# Patient Record
Sex: Male | Born: 1960 | Race: Black or African American | Hispanic: No | Smoking: Current every day smoker
Health system: Southern US, Community
[De-identification: ages and names within clinical notes are randomized; demographics above are authoritative.]

## PROBLEM LIST (undated history)

## (undated) DIAGNOSIS — F1410 Cocaine abuse, uncomplicated: Secondary | ICD-10-CM

## (undated) DIAGNOSIS — M1990 Unspecified osteoarthritis, unspecified site: Secondary | ICD-10-CM

## (undated) DIAGNOSIS — R55 Syncope and collapse: Secondary | ICD-10-CM

## (undated) DIAGNOSIS — Z720 Tobacco use: Secondary | ICD-10-CM

## (undated) DIAGNOSIS — J42 Unspecified chronic bronchitis: Secondary | ICD-10-CM

## (undated) DIAGNOSIS — K219 Gastro-esophageal reflux disease without esophagitis: Secondary | ICD-10-CM

## (undated) DIAGNOSIS — E785 Hyperlipidemia, unspecified: Secondary | ICD-10-CM

## (undated) DIAGNOSIS — J45909 Unspecified asthma, uncomplicated: Secondary | ICD-10-CM

## (undated) DIAGNOSIS — E7800 Pure hypercholesterolemia, unspecified: Secondary | ICD-10-CM

## (undated) DIAGNOSIS — I10 Essential (primary) hypertension: Secondary | ICD-10-CM

## (undated) DIAGNOSIS — R519 Headache, unspecified: Secondary | ICD-10-CM

## (undated) DIAGNOSIS — G4733 Obstructive sleep apnea (adult) (pediatric): Secondary | ICD-10-CM

## (undated) DIAGNOSIS — E119 Type 2 diabetes mellitus without complications: Secondary | ICD-10-CM

## (undated) HISTORY — DX: Syncope and collapse: R55

## (undated) HISTORY — DX: Unspecified asthma, uncomplicated: J45.909

## (undated) HISTORY — DX: Obstructive sleep apnea (adult) (pediatric): G47.33

## (undated) HISTORY — DX: Cocaine abuse, uncomplicated: F14.10

## (undated) HISTORY — DX: Hyperlipidemia, unspecified: E78.5

## (undated) HISTORY — DX: Tobacco use: Z72.0

## (undated) HISTORY — PX: FINGER AMPUTATION: SHX636

---

## 1997-09-02 ENCOUNTER — Emergency Department (HOSPITAL_COMMUNITY): Admission: EM | Admit: 1997-09-02 | Discharge: 1997-09-02 | Payer: Self-pay | Admitting: Emergency Medicine

## 2001-07-15 ENCOUNTER — Emergency Department (HOSPITAL_COMMUNITY): Admission: EM | Admit: 2001-07-15 | Discharge: 2001-07-15 | Payer: Self-pay | Admitting: Emergency Medicine

## 2002-06-03 ENCOUNTER — Emergency Department (HOSPITAL_COMMUNITY): Admission: EM | Admit: 2002-06-03 | Discharge: 2002-06-03 | Payer: Self-pay | Admitting: Emergency Medicine

## 2002-06-18 ENCOUNTER — Observation Stay (HOSPITAL_COMMUNITY): Admission: EM | Admit: 2002-06-18 | Discharge: 2002-06-18 | Payer: Self-pay | Admitting: Emergency Medicine

## 2002-06-18 ENCOUNTER — Encounter: Payer: Self-pay | Admitting: Emergency Medicine

## 2002-08-11 ENCOUNTER — Encounter: Payer: Self-pay | Admitting: Emergency Medicine

## 2002-08-11 ENCOUNTER — Emergency Department (HOSPITAL_COMMUNITY): Admission: EM | Admit: 2002-08-11 | Discharge: 2002-08-12 | Payer: Self-pay | Admitting: Emergency Medicine

## 2003-01-05 ENCOUNTER — Encounter: Payer: Self-pay | Admitting: Family Medicine

## 2003-01-05 ENCOUNTER — Emergency Department (HOSPITAL_COMMUNITY): Admission: AD | Admit: 2003-01-05 | Discharge: 2003-01-05 | Payer: Self-pay | Admitting: Family Medicine

## 2003-10-08 ENCOUNTER — Emergency Department (HOSPITAL_COMMUNITY): Admission: EM | Admit: 2003-10-08 | Discharge: 2003-10-08 | Payer: Self-pay | Admitting: Emergency Medicine

## 2003-10-14 ENCOUNTER — Emergency Department (HOSPITAL_COMMUNITY): Admission: EM | Admit: 2003-10-14 | Discharge: 2003-10-14 | Payer: Self-pay | Admitting: Emergency Medicine

## 2003-12-22 ENCOUNTER — Emergency Department (HOSPITAL_COMMUNITY): Admission: EM | Admit: 2003-12-22 | Discharge: 2003-12-22 | Payer: Self-pay | Admitting: Emergency Medicine

## 2004-01-19 ENCOUNTER — Emergency Department (HOSPITAL_COMMUNITY): Admission: EM | Admit: 2004-01-19 | Discharge: 2004-01-19 | Payer: Self-pay | Admitting: Emergency Medicine

## 2004-01-20 ENCOUNTER — Inpatient Hospital Stay (HOSPITAL_COMMUNITY): Admission: EM | Admit: 2004-01-20 | Discharge: 2004-01-21 | Payer: Self-pay | Admitting: Emergency Medicine

## 2004-03-15 ENCOUNTER — Emergency Department (HOSPITAL_COMMUNITY): Admission: EM | Admit: 2004-03-15 | Discharge: 2004-03-15 | Payer: Self-pay | Admitting: Emergency Medicine

## 2004-05-30 ENCOUNTER — Emergency Department (HOSPITAL_COMMUNITY): Admission: EM | Admit: 2004-05-30 | Discharge: 2004-05-30 | Payer: Self-pay | Admitting: *Deleted

## 2004-07-06 ENCOUNTER — Emergency Department (HOSPITAL_COMMUNITY): Admission: EM | Admit: 2004-07-06 | Discharge: 2004-07-06 | Payer: Self-pay | Admitting: Emergency Medicine

## 2004-12-12 ENCOUNTER — Emergency Department (HOSPITAL_COMMUNITY): Admission: EM | Admit: 2004-12-12 | Discharge: 2004-12-12 | Payer: Self-pay | Admitting: *Deleted

## 2005-03-29 ENCOUNTER — Emergency Department (HOSPITAL_COMMUNITY): Admission: EM | Admit: 2005-03-29 | Discharge: 2005-03-29 | Payer: Self-pay | Admitting: Emergency Medicine

## 2005-03-30 ENCOUNTER — Emergency Department (HOSPITAL_COMMUNITY): Admission: EM | Admit: 2005-03-30 | Discharge: 2005-03-30 | Payer: Self-pay | Admitting: Emergency Medicine

## 2005-06-21 ENCOUNTER — Emergency Department (HOSPITAL_COMMUNITY): Admission: EM | Admit: 2005-06-21 | Discharge: 2005-06-21 | Payer: Self-pay | Admitting: Emergency Medicine

## 2006-04-04 ENCOUNTER — Encounter: Admission: RE | Admit: 2006-04-04 | Discharge: 2006-04-04 | Payer: Self-pay | Admitting: Cardiovascular Disease

## 2006-05-09 ENCOUNTER — Ambulatory Visit (HOSPITAL_COMMUNITY): Admission: RE | Admit: 2006-05-09 | Discharge: 2006-05-09 | Payer: Self-pay | Admitting: Cardiovascular Disease

## 2006-09-05 ENCOUNTER — Ambulatory Visit (HOSPITAL_COMMUNITY): Admission: RE | Admit: 2006-09-05 | Discharge: 2006-09-05 | Payer: Self-pay | Admitting: Cardiovascular Disease

## 2006-09-27 ENCOUNTER — Emergency Department (HOSPITAL_COMMUNITY): Admission: EM | Admit: 2006-09-27 | Discharge: 2006-09-27 | Payer: Self-pay | Admitting: Emergency Medicine

## 2007-01-11 ENCOUNTER — Encounter: Admission: RE | Admit: 2007-01-11 | Discharge: 2007-01-11 | Payer: Self-pay | Admitting: General Surgery

## 2007-02-27 ENCOUNTER — Encounter: Admission: RE | Admit: 2007-02-27 | Discharge: 2007-02-27 | Payer: Self-pay | Admitting: Cardiovascular Disease

## 2007-03-05 ENCOUNTER — Emergency Department (HOSPITAL_COMMUNITY): Admission: EM | Admit: 2007-03-05 | Discharge: 2007-03-05 | Payer: Self-pay | Admitting: Emergency Medicine

## 2009-04-19 ENCOUNTER — Ambulatory Visit: Payer: Self-pay | Admitting: Family Medicine

## 2009-04-20 ENCOUNTER — Ambulatory Visit (HOSPITAL_COMMUNITY): Admission: RE | Admit: 2009-04-20 | Discharge: 2009-04-20 | Payer: Self-pay | Admitting: Family Medicine

## 2009-04-30 ENCOUNTER — Ambulatory Visit (HOSPITAL_COMMUNITY): Admission: RE | Admit: 2009-04-30 | Discharge: 2009-04-30 | Payer: Self-pay | Admitting: Cardiology

## 2009-05-11 ENCOUNTER — Ambulatory Visit: Payer: Self-pay | Admitting: Internal Medicine

## 2009-05-12 ENCOUNTER — Encounter (INDEPENDENT_AMBULATORY_CARE_PROVIDER_SITE_OTHER): Payer: Self-pay | Admitting: Family Medicine

## 2009-05-12 LAB — CONVERTED CEMR LAB
ALT: 26 units/L (ref 0–53)
AST: 22 units/L (ref 0–37)
Albumin: 4.5 g/dL (ref 3.5–5.2)
Alkaline Phosphatase: 57 units/L (ref 39–117)
BUN: 11 mg/dL (ref 6–23)
Basophils Absolute: 0 10*3/uL (ref 0.0–0.1)
Basophils Relative: 1 % (ref 0–1)
CO2: 22 meq/L (ref 19–32)
Calcium: 9.7 mg/dL (ref 8.4–10.5)
Chloride: 102 meq/L (ref 96–112)
Cholesterol: 253 mg/dL — ABNORMAL HIGH (ref 0–200)
Creatinine, Ser: 0.96 mg/dL (ref 0.40–1.50)
Eosinophils Absolute: 0.1 10*3/uL (ref 0.0–0.7)
Eosinophils Relative: 2 % (ref 0–5)
Glucose, Bld: 95 mg/dL (ref 70–99)
HCT: 47 % (ref 39.0–52.0)
HDL: 44 mg/dL (ref >39)
Hemoglobin: 15 g/dL (ref 13.0–17.0)
LDL Cholesterol: 194 mg/dL — ABNORMAL HIGH (ref 0–99)
Lymphocytes Relative: 54 % — ABNORMAL HIGH (ref 12–46)
Lymphs Abs: 3.2 10*3/uL (ref 0.7–4.0)
MCHC: 31.9 g/dL (ref 30.0–36.0)
MCV: 82.3 fL (ref 78.0–100.0)
Monocytes Absolute: 0.4 10*3/uL (ref 0.1–1.0)
Monocytes Relative: 7 % (ref 3–12)
Neutro Abs: 2.2 10*3/uL (ref 1.7–7.7)
Neutrophils Relative %: 36 % — ABNORMAL LOW (ref 43–77)
PSA: 0.72 ng/mL (ref 0.10–4.00)
Platelets: 291 10*3/uL (ref 150–400)
Potassium: 4.5 meq/L (ref 3.5–5.3)
RBC: 5.71 M/uL (ref 4.22–5.81)
RDW: 17.2 % — ABNORMAL HIGH (ref 11.5–15.5)
Sodium: 138 meq/L (ref 135–145)
Total Bilirubin: 0.4 mg/dL (ref 0.3–1.2)
Total CHOL/HDL Ratio: 5.8
Total Protein: 8.1 g/dL (ref 6.0–8.3)
Triglycerides: 76 mg/dL (ref <150)
VLDL: 15 mg/dL (ref 0–40)
Vit D, 25-Hydroxy: 11 ng/mL — ABNORMAL LOW (ref 30–89)
WBC: 6 10*3/uL (ref 4.0–10.5)

## 2009-05-18 ENCOUNTER — Ambulatory Visit: Payer: Self-pay | Admitting: Internal Medicine

## 2009-05-18 ENCOUNTER — Ambulatory Visit: Payer: Self-pay | Admitting: Family Medicine

## 2009-05-25 ENCOUNTER — Ambulatory Visit (HOSPITAL_COMMUNITY): Admission: RE | Admit: 2009-05-25 | Discharge: 2009-05-25 | Payer: Self-pay | Admitting: Family Medicine

## 2009-07-13 ENCOUNTER — Ambulatory Visit: Payer: Self-pay | Admitting: Internal Medicine

## 2009-07-18 ENCOUNTER — Emergency Department (HOSPITAL_COMMUNITY): Admission: EM | Admit: 2009-07-18 | Discharge: 2009-07-18 | Payer: Self-pay | Admitting: Family Medicine

## 2009-11-18 ENCOUNTER — Emergency Department (HOSPITAL_COMMUNITY): Admission: EM | Admit: 2009-11-18 | Discharge: 2009-11-18 | Payer: Self-pay | Admitting: Emergency Medicine

## 2010-05-02 ENCOUNTER — Inpatient Hospital Stay (INDEPENDENT_AMBULATORY_CARE_PROVIDER_SITE_OTHER)
Admission: RE | Admit: 2010-05-02 | Discharge: 2010-05-02 | Disposition: A | Discharge status: Home / Self Care | Payer: Self-pay | Source: Ambulatory Visit | Attending: Family Medicine | Admitting: Family Medicine

## 2010-05-02 DIAGNOSIS — L723 Sebaceous cyst: Secondary | ICD-10-CM

## 2010-07-25 ENCOUNTER — Ambulatory Visit (INDEPENDENT_AMBULATORY_CARE_PROVIDER_SITE_OTHER): Payer: Self-pay

## 2010-07-25 ENCOUNTER — Inpatient Hospital Stay (INDEPENDENT_AMBULATORY_CARE_PROVIDER_SITE_OTHER)
Admission: RE | Admit: 2010-07-25 | Discharge: 2010-07-25 | Disposition: A | Discharge status: Home / Self Care | Payer: Self-pay | Source: Ambulatory Visit | Attending: Family Medicine | Admitting: Family Medicine

## 2010-07-25 DIAGNOSIS — J40 Bronchitis, not specified as acute or chronic: Secondary | ICD-10-CM

## 2010-07-25 DIAGNOSIS — R05 Cough: Secondary | ICD-10-CM

## 2010-07-25 DIAGNOSIS — R059 Cough, unspecified: Secondary | ICD-10-CM

## 2010-08-08 ENCOUNTER — Ambulatory Visit (INDEPENDENT_AMBULATORY_CARE_PROVIDER_SITE_OTHER): Payer: Self-pay

## 2010-08-08 ENCOUNTER — Inpatient Hospital Stay (INDEPENDENT_AMBULATORY_CARE_PROVIDER_SITE_OTHER)
Admission: RE | Admit: 2010-08-08 | Discharge: 2010-08-08 | Disposition: A | Discharge status: Home / Self Care | Payer: Self-pay | Source: Ambulatory Visit | Attending: Emergency Medicine | Admitting: Emergency Medicine

## 2010-08-08 DIAGNOSIS — M25469 Effusion, unspecified knee: Secondary | ICD-10-CM

## 2010-08-08 DIAGNOSIS — M79609 Pain in unspecified limb: Secondary | ICD-10-CM

## 2010-08-12 NOTE — Op Note (Signed)
[name], Kenneth NO.:  [name]   MEDICAL RECORD NO.:  [id number]                   PATIENT TYPE:  EMS   LOCATION:  MAJO                                 FACILITY:  [facility]   PHYSICIAN:  Kenneth [name], M.D.         DATE OF BIRTH:  September 03, 1960   DATE OF PROCEDURE:  01/05/2003  DATE OF DISCHARGE:                                 OPERATIVE REPORT   PREOPERATIVE DIAGNOSIS:   POSTOPERATIVE DIAGNOSIS:   OPERATION PERFORMED:   SURGEON:  Kenneth [name], M.D.   ASSISTANT:   ANESTHESIA:   COMPLICATIONS:   INDICATIONS FOR PROCEDURE:  Kenneth [name] presents to the emergency room for  evaluation.  He was seen by Kenneth [name] and referred for my care.  The patient is 50 years of age.  He works for a [employer].  He  injured his left index finger today with a hand saw.  This was a cut through  the dorsal aspect of the left index finger with extensor tendon injury.  This occurred approximately noon.  The patient has had a tetanus shot seven  months ago.  He was seen and evaluated by Kenneth [name] and asked to see me  for hand surgery care.  I have discussed with the patient his findings at  length.   He notes no numbness or tingling.  He states he does have significant pain  in the finger.  I have noted that the patient does have a freshly cut  tendon.  There was some fraying to the tendon edges.   PAST MEDICAL HISTORY:  History of bleeding ulcers, last episode three weeks  ago, often associated with increased alcohol intake.   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 Kenneth [name], M.D. after an injury.   MEDICINES:  None.   ALLERGIES:  None.   FAMILY HISTORY:  Noncontributory.   SOCIAL HISTORY:  He is divorced with children.  He works for [employer].  He smokes one pack per day and occasionally drinks alcohol.  He  occasionally consumes marijuana with  smoking activities.   REVIEW OF SYSTEMS:  Positive for occasional ulcerative issues.  I have  discussed these issues with him at length.   PHYSICAL EXAMINATION:  GENERAL:  He is a black male, alert and oriented, in  no acute distress.  VITAL SIGNS:  Stable.  EXTREMITIES:  He has no signs of infection in the hand.  He has a laceration  of the dorsal aspect of the left index finger with exposed extensor tendon  over the proximal phalanx. The remaining fingers are normal.  Elbow and  shoulder examination is benign.  Bilateral lower extremity examination is  negative.  CHEST:  Clear.  ABDOMEN:  Nontender and nondistended and soft.   His x-rays were reviewed from today which shows no fracture dislocation or  space occupying lesion.   IMPRESSION:  Status post saw injury, left index finger with extensor tendon  laceration and poor tissue conditions.   PLAN:  I have verbally consented him for incision and drainage and repair of  structures as necessary.   DESCRIPTION OF PROCEDURE:  The patient was taken to the procedure area where  he was given additional block.  Following this, he was prepped and draped in  the usual sterile fashion, given a 10-minute surgical Betadine scrub  followed by isolation of the finger.  I then took a 15 blade and extended  the incision proximally and distally, trimmed nonviable tissue including  tendon, subcu and skin tissue.  I then irrigated copiously with greater than  a liter of fluid.  Following this, the patient underwent extensor tendon  repair in zone 4 without difficulty.  The patient tolerated the procedure  well.  This was an oblique laceration and I shortened it somewhat but got an  excellent repair.  I then ranged the finger and noted that he had an  excellent repair with the 4-0 Ethibond suture.  I was pleased with this in  the findings.  Following this, I then irrigated the wound copiously and  ensured correct hemostasis with cautery and  following this, the wound was  closed.  Xeroform was placed.  He was then placed in a splint to keep the  hand immobilized.  The patient tolerated the procedure well.  We discharged  him on appropriate pain medicine in the form of Percocet and Robaxin.  I  have placed him on Keflex as well as Pepcid 20 mg 1 by mouth twice daily.  We will have him seen in therapy in two to three days and see him in the  office by myself in seven days.  I have discussed with him do's and don't's,  etc.  He is a candidate for early range of motion and I have discussed some  of these issues.  All questions have been encouraged and answered.                                                Kenneth [name], M.D.    [facility]  D:  01/05/2003  T:  01/06/2003  Job:  [id number]   cc:   Lilyan Punt. Sydnee [name], M.D.  [address] [place]  [place] [address]  Fax: [phone]

## 2010-08-12 NOTE — Discharge Summary (Signed)
NAMEULRICH, SOULES NO.:  [id number]   MEDICAL RECORD NO.:  [id number]          PATIENT TYPE:  INP   LOCATION:  2037                         FACILITY:  [facility]   PHYSICIAN:  Charlton Haws, M.D.     DATE OF BIRTH:  [birth year]   DATE OF ADMISSION:  01/20/2004  DATE OF DISCHARGE:  01/21/2004                                 DISCHARGE SUMMARY   PROCEDURES:  1.  Cardiac catheterization.  2.  Coronary arteriogram.  3.  Left ventriculogram.   HOSPITAL COURSE:  [name] is a 50 year old male with no known history of  coronary artery disease.  He had some chest pain and came to the emergency  room on January 19, 2004.  The chest pain had resolved.  He had a urine drug  screen done which was positive for cocaine.  Mr. Laymon refused to stay and  be evaluated.  Mr. Ferns came back to the emergency room on January 20, 2004.  He stated he had had no further chest pain, but wanted to find out  what was going on.  He was admitted for further evaluation and treatment.   Mr. Shenker was evaluated by Dr. Eden Emms who felt that cardiac catheterization  was indicated to define his anatomy.  He had a catheterization done on  January 20, 2004.   The cardiac catheterization showed a 30% left main lesion at the ostium.  The LAD, circumflex, and RCA systems were all normal.  His EF was 65% with  normal wall motion and no MR.  His aorta had no dissection.  Dr. Eden Emms felt  that he had noncardiac chest pain possibly secondary to cocaine.  He felt  that he could be discharged on January 20, 2004 if his groin was stable.   Post procedure Mr. Fjeld was asymptomatic and his groin was stable.  If his  groin remains stable he will be discharged on January 21, 2004 with  outpatient follow-up arranged.   LABORATORIES:  Hemoglobin 14.4, hematocrit 43.2, WBC 6.3, platelets 262.  Urine drug screen positive only for cocaine.   CONDITION ON DISCHARGE:  Stable.   DISCHARGE DIAGNOSES:  1.  Chest  pain.  No significant coronary artery disease by catheterization      this admission.  2.  History of alcohol, tobacco, and drug abuse.  3.  History of hyperlipidemia.  4.  Remote history of peptic ulcer disease.   DISCHARGE INSTRUCTIONS:  His activity level is to include no driving or  strenuous activity for two days.  He is to stick to a low fat diet.  He is  to call our office for problems with the catheterization site.  He is not to  use alcohol, tobacco, or drugs.  He is to follow up with the P.A. for Dr.  Eden Emms on November 10 at 12:30 p.m.   DISCHARGE MEDICATIONS:  Over-the-counter H2 blocker p.r.n.       RB/MEDQ  D:  01/20/2004  T:  01/20/2004  Job:  [id number]

## 2010-08-12 NOTE — Cardiovascular Report (Signed)
[name], Kenneth [name] NO.:  [id number]   MEDICAL RECORD NO.:  [id number]          PATIENT TYPE:  OIB   LOCATION:  2899                         FACILITY:  [facility]   PHYSICIAN:  Ricki Rodriguez, M.D.  DATE OF BIRTH:  11/05/1960   DATE OF PROCEDURE:  05/09/2006  DATE OF DISCHARGE:                            CARDIAC CATHETERIZATION   PROCEDURES:  1. Left heart catheterization.  2. Selective coronary angiography.  3. Left ventricular function study.   INDICATIONS:  This 50 year old black male presented with recurrent chest  pain, exertional dyspnea, hypertension, and risk factors of smoking.   APPROACH:  Right femoral artery using 4-French sheath and catheters.   COMPLICATIONS:  None.   HEMODYNAMIC DATA:  The aortic pressure was 100/80, and left ventricular  pressure was 100/10.   CORONARY ANATOMY:  The left main coronary artery was unremarkable but  had ectatic segment with apparent ostial narrowing.   Left anterior descending coronary artery: The left anterior descending  artery also showed proximal ectasia and had 20% intermediate lesions x3.  The distal vessel was unremarkable.  The diagonal #1 vessel had ostial  to proximal 30% eccentric lesion.  Diagonal #2 was unremarkable, and  diagonal-3 was a very small vessel.   Left circumflex coronary artery:  The left circumflex coronary artery  showed luminal irregularities, otherwise was unremarkable.  Its ramus  branch was also unremarkable.  The obtuse marginal branch #1 had a  diffuse proximal disease, and obtuse marginal branch #2 was  unremarkable.   Right coronary artery:  The right artery was dominant, had distal long  30% concentric narrowing with 20% spasm which resolved with 100 mcg  nitroglycerin use.  Its posterolateral and posterior descending coronary  arteries were unremarkable.   Left ventriculogram: The left ventriculogram showed mild apical  hypokinesia with ejection fraction of 60%.   IMPRESSION:  1. Mild multivessel native vessel coronary artery disease.  2. Right coronary artery spasm.   RECOMMENDATIONS:  This patient will be treated medically with the  addition of a small dose of calcium channel blocker to his current  medications and also addition of Zocor or Lipitor along with lifestyle  modification, primarily smoking cessation.      Ricki Rodriguez, M.D.  Electronically Signed     ASK/MEDQ  D:  05/09/2006  T:  05/09/2006  Job:  [id number]

## 2010-08-12 NOTE — Op Note (Signed)
NAME:  Kenneth [name], Kenneth [name]                           ACCOUNT NO.:  [id number]   MEDICAL RECORD NO.:  [id number]                   PATIENT TYPE:  OBV   LOCATION:  2550                                 FACILITY:  [facility]   PHYSICIAN:  Katy Fitch. Naaman Plummer., M.D.          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   POSTOPERATIVE DIAGNOSIS:  Traumatic amputation through proximal metaphysis  of distal phalanx, left small finger with avulsion of 90% of the dorsal,  intermediate and ventral nail matrix.   OPERATION/PROCEDURE:  1. Resection of nail matrix including remnants of dorsal, intermediate and     ventral nail fold.  2. Distal interphalangeal disarticulation revision amputation of left small     finger through distal metaphysis of middle phalanx.   SURGEON:  Katy Fitch. Sypher, M.D.   ASSISTANT:  Jonni Sanger, P.A.   ANESTHESIA:  General by LMA.   ANESTHESIOLOGIST:  Burna Forts, M.D.   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  He lost the  nail plate and approximately 90% of the dorsal, intermediate and ventral  nail fold.  He was seen in the [facility] emergency room by Dr. Beverely Pace who performed x-rays  revealing an amputation through the base of the distal phalanx.  A hand surgery consult was requested.  Clinical examination revealed signs of a traumatic amputation, short on the  volar aspect of the finger.  We recommended proceeding with revision  amputation. After  informed consent, Kenneth [name] was brought to the operating  room at this time.   DESCRIPTION OF PROCEDURE:  Kenneth [name] was brought to the operating room and  placed in the supine position on the table.  Following induction of general  anesthesia by LMA, the left arm was prepped with Betadine soak and solution  and sterilely draped.  Following exsanguination of the limb with the Esmarch bandage, with  tourniquet __________ inflated to 230 mmHg.  The procedure commenced with  irrigation and debridement of the wound.  The foreign material was removed.  The dorsal, intermediate and ventral nail fold remnants were carefully  debrided utilizing a 15-scalpel blade and dural hooks to retract the skin.  The skin margins were debrided.  The radial __________ artery and nerve were  identified.  The artery placed under traction, electrocauterized and allowed  to retract.  The nerve was placed under traction, and transected with a  fresh  scalpel blade.  The ulnar ___________ digital artery and nerve were likewise revised.  The  ulnar ___________ nerve was allowed to retract into virgin fat.  The skin flaps would not allow coverage at the level of the bony injury.  Therefore, the DIP joint was disarticulated and the hyaline articular  cartilage surfaces and condyles of the middle phalanx were removed.  The wound was then closed with dorsal and palmar flaps with mattress sutures  of 5-0 nylon.  The finger was dressed with Xeroform sterile gauze and Coban.  Kenneth [name] was advised to elevate his hand for one week.  He will return to  our office for followup in five to seven days for dressing change.  He was  given prescriptions for Percocet 5 mg one to two tablets p.o. q.4-6h. p.r.n.  pain, 30 capsules without refill.  Also Motrin 600 mg one p.o. q.6h. p.r.n.  pain 30 tablets with one refill and Keflex 500 mg one p.o. q.8h. x 4 days  for prophylactic antibiotic.                                                Katy Fitch Naaman Plummer., M.D.    RVS/MEDQ  D:  06/18/2002  T:  06/19/2002  Job:  [id number]

## 2010-08-12 NOTE — Cardiovascular Report (Signed)
[name], [name] NO.:  [id number]   MEDICAL RECORD NO.:  [id number]          PATIENT TYPE:  INP   LOCATION:  1826                         FACILITY:  [facility]   PHYSICIAN:  Charlton Haws, M.D.     DATE OF BIRTH:  1960-08-07   DATE OF PROCEDURE:  01/20/2004  DATE OF DISCHARGE:                              CARDIAC CATHETERIZATION   INDICATIONS FOR PROCEDURE:  A 50 year old patient with positive drug  screening for cocaine.  The patient has been at the emergency room the last  two evenings with chest pain.  He has multiple coronary risk factors,  including smoking, family history for coronary disease.  Catheterization was  done to rule out coronary disease and to prevent multiple readmissions from  chest pain visits to the ER.   FINDINGS:   ARTERIOGRAPHY:  1.  LEFT MAIN CORONARY ARTERY:  The main coronary artery had a 30% ostial      stenosis.  2.  LEFT ANTERIOR DESCENDING ARTERY:  Normal.  3.  CIRCUMFLEX CORONARY ARTERY:  Normal.  4.  RIGHT CORONARY ARTERY:  Dominant and normal.   VENTRICULOGRAPHY:  RAO ventriculography was normal.  EF was 65%.  There was  no gradient across the aortic valve and no MR.   HEMODYNAMIC DATA:  1.  Aortic Pressure:  136/80.  2.  Left Ventricular Pressure:  132/18.   AORTOGRAM:  Because of his recurrent chest pain and the use of cocaine, an  aortogram was performed.  There is no evidence of dissection and no aortic  insufficiency.   IMPRESSION:  The patient's chest pain appeared to be noncardiac in etiology.  He was counseled about drug use, including cocaine and alcohol.  It was  relatively late in the day, and I am concerned if he goes home that he will  do recurrent drugs and may bleed from his leg.  We will keep him until the  morning and discharge him in the a.m. if his groin heals well.       PN/MEDQ  D:  01/20/2004  T:  01/20/2004  Job:  [id number]

## 2010-09-10 ENCOUNTER — Inpatient Hospital Stay (INDEPENDENT_AMBULATORY_CARE_PROVIDER_SITE_OTHER)
Admission: RE | Admit: 2010-09-10 | Discharge: 2010-09-10 | Disposition: A | Discharge status: Home / Self Care | Payer: Self-pay | Source: Ambulatory Visit | Attending: Emergency Medicine | Admitting: Emergency Medicine

## 2010-09-10 DIAGNOSIS — K602 Anal fissure, unspecified: Secondary | ICD-10-CM

## 2010-09-12 LAB — OCCULT BLOOD, POC DEVICE: Fecal Occult Bld: POSITIVE

## 2010-11-01 ENCOUNTER — Ambulatory Visit (HOSPITAL_COMMUNITY)
Admission: RE | Admit: 2010-11-01 | Discharge: 2010-11-01 | Disposition: A | Discharge status: Home / Self Care | Payer: Self-pay | Source: Ambulatory Visit | Attending: Gastroenterology | Admitting: Gastroenterology

## 2010-11-01 DIAGNOSIS — Z538 Procedure and treatment not carried out for other reasons: Secondary | ICD-10-CM | POA: Insufficient documentation

## 2010-11-01 DIAGNOSIS — Z860100 Personal history of colon polyps, unspecified: Secondary | ICD-10-CM | POA: Insufficient documentation

## 2010-11-30 ENCOUNTER — Other Ambulatory Visit: Payer: Self-pay | Admitting: Gastroenterology

## 2010-11-30 ENCOUNTER — Ambulatory Visit (HOSPITAL_COMMUNITY)
Admission: RE | Admit: 2010-11-30 | Discharge: 2010-11-30 | Disposition: A | Discharge status: Home / Self Care | Payer: Self-pay | Source: Ambulatory Visit | Attending: Gastroenterology | Admitting: Gastroenterology

## 2010-11-30 DIAGNOSIS — K921 Melena: Secondary | ICD-10-CM | POA: Insufficient documentation

## 2010-11-30 DIAGNOSIS — D126 Benign neoplasm of colon, unspecified: Secondary | ICD-10-CM | POA: Insufficient documentation

## 2010-11-30 DIAGNOSIS — K5900 Constipation, unspecified: Secondary | ICD-10-CM | POA: Insufficient documentation

## 2010-12-10 ENCOUNTER — Emergency Department (HOSPITAL_COMMUNITY): Payer: Self-pay

## 2010-12-10 ENCOUNTER — Emergency Department (HOSPITAL_COMMUNITY)
Admission: EM | Admit: 2010-12-10 | Discharge: 2010-12-10 | Disposition: A | Discharge status: Home / Self Care | Payer: Self-pay | Attending: Emergency Medicine | Admitting: Emergency Medicine

## 2010-12-10 DIAGNOSIS — K6289 Other specified diseases of anus and rectum: Secondary | ICD-10-CM | POA: Insufficient documentation

## 2010-12-10 DIAGNOSIS — I2510 Atherosclerotic heart disease of native coronary artery without angina pectoris: Secondary | ICD-10-CM | POA: Insufficient documentation

## 2010-12-10 DIAGNOSIS — E7800 Pure hypercholesterolemia, unspecified: Secondary | ICD-10-CM | POA: Insufficient documentation

## 2010-12-10 DIAGNOSIS — I10 Essential (primary) hypertension: Secondary | ICD-10-CM | POA: Insufficient documentation

## 2010-12-10 DIAGNOSIS — F411 Generalized anxiety disorder: Secondary | ICD-10-CM | POA: Insufficient documentation

## 2010-12-10 DIAGNOSIS — Z79899 Other long term (current) drug therapy: Secondary | ICD-10-CM | POA: Insufficient documentation

## 2010-12-10 DIAGNOSIS — E669 Obesity, unspecified: Secondary | ICD-10-CM | POA: Insufficient documentation

## 2010-12-10 LAB — DIFFERENTIAL
Basophils Absolute: 0 10*3/uL (ref 0.0–0.1)
Basophils Relative: 0 % (ref 0–1)
Eosinophils Absolute: 0.2 10*3/uL (ref 0.0–0.7)
Eosinophils Relative: 2 % (ref 0–5)
Lymphocytes Relative: 46 % (ref 12–46)
Lymphs Abs: 3.6 10*3/uL (ref 0.7–4.0)
Monocytes Absolute: 0.7 10*3/uL (ref 0.1–1.0)
Monocytes Relative: 9 % (ref 3–12)
Neutro Abs: 3.3 10*3/uL (ref 1.7–7.7)
Neutrophils Relative %: 43 % (ref 43–77)

## 2010-12-10 LAB — URINALYSIS, ROUTINE W REFLEX MICROSCOPIC
Bilirubin Urine: NEGATIVE
Glucose, UA: NEGATIVE mg/dL
Hgb urine dipstick: NEGATIVE
Ketones, ur: NEGATIVE mg/dL
Leukocytes, UA: NEGATIVE
Nitrite: NEGATIVE
Protein, ur: NEGATIVE mg/dL
Specific Gravity, Urine: 1.017 (ref 1.005–1.030)
Urobilinogen, UA: 0.2 mg/dL (ref 0.0–1.0)
pH: 5.5 (ref 5.0–8.0)

## 2010-12-10 LAB — POCT I-STAT, CHEM 8
BUN: 15 mg/dL (ref 6–23)
Calcium, Ion: 1.11 mmol/L — ABNORMAL LOW (ref 1.12–1.32)
Chloride: 107 mEq/L (ref 96–112)
Creatinine, Ser: 0.8 mg/dL (ref 0.50–1.35)
Glucose, Bld: 98 mg/dL (ref 70–99)
HCT: 46 % (ref 39.0–52.0)
Hemoglobin: 15.6 g/dL (ref 13.0–17.0)
Potassium: 4.1 mEq/L (ref 3.5–5.1)
Sodium: 141 mEq/L (ref 135–145)
TCO2: 23 mmol/L (ref 0–100)

## 2010-12-10 LAB — CBC
HCT: 40.7 % (ref 39.0–52.0)
Hemoglobin: 13.8 g/dL (ref 13.0–17.0)
MCH: 26.6 pg (ref 26.0–34.0)
MCHC: 33.9 g/dL (ref 30.0–36.0)
MCV: 78.4 fL (ref 78.0–100.0)
Platelets: 255 10*3/uL (ref 150–400)
RBC: 5.19 MIL/uL (ref 4.22–5.81)
RDW: 15.4 % (ref 11.5–15.5)
WBC: 7.8 10*3/uL (ref 4.0–10.5)

## 2010-12-10 LAB — OCCULT BLOOD, POC DEVICE: Fecal Occult Bld: NEGATIVE

## 2010-12-10 MED ORDER — IOHEXOL 300 MG/ML  SOLN
100.0000 mL | Freq: Once | INTRAMUSCULAR | Status: AC | PRN
  Administered 2010-12-10: 100 mL via INTRAVENOUS

## 2011-06-03 ENCOUNTER — Encounter (HOSPITAL_COMMUNITY): Payer: Self-pay | Admitting: Emergency Medicine

## 2011-06-03 ENCOUNTER — Encounter (HOSPITAL_COMMUNITY): Payer: Self-pay

## 2011-06-03 ENCOUNTER — Emergency Department (HOSPITAL_COMMUNITY)
Admission: EM | Admit: 2011-06-03 | Discharge: 2011-06-03 | Disposition: A | Discharge status: Home Health | Payer: Self-pay | Attending: Emergency Medicine | Admitting: Emergency Medicine

## 2011-06-03 ENCOUNTER — Emergency Department (INDEPENDENT_AMBULATORY_CARE_PROVIDER_SITE_OTHER)
Admission: EM | Admit: 2011-06-03 | Discharge: 2011-06-03 | Disposition: A | Discharge status: Nursing Home (Includes ICF) | Payer: Self-pay | Source: Home / Self Care | Attending: Emergency Medicine | Admitting: Emergency Medicine

## 2011-06-03 DIAGNOSIS — Z79899 Other long term (current) drug therapy: Secondary | ICD-10-CM | POA: Insufficient documentation

## 2011-06-03 DIAGNOSIS — Z7982 Long term (current) use of aspirin: Secondary | ICD-10-CM | POA: Insufficient documentation

## 2011-06-03 DIAGNOSIS — J020 Streptococcal pharyngitis: Secondary | ICD-10-CM | POA: Insufficient documentation

## 2011-06-03 DIAGNOSIS — F17200 Nicotine dependence, unspecified, uncomplicated: Secondary | ICD-10-CM | POA: Insufficient documentation

## 2011-06-03 DIAGNOSIS — J36 Peritonsillar abscess: Secondary | ICD-10-CM

## 2011-06-03 DIAGNOSIS — E7800 Pure hypercholesterolemia, unspecified: Secondary | ICD-10-CM | POA: Insufficient documentation

## 2011-06-03 HISTORY — DX: Pure hypercholesterolemia, unspecified: E78.00

## 2011-06-03 LAB — POCT RAPID STREP A: Streptococcus, Group A Screen (Direct): POSITIVE — AB

## 2011-06-03 MED ORDER — PENICILLIN G BENZATHINE 1200000 UNIT/2ML IM SUSP
1.2000 10*6.[IU] | INTRAMUSCULAR | Status: AC
  Administered 2011-06-03: 1.2 10*6.[IU] via INTRAMUSCULAR
  Filled 2011-06-03: qty 2

## 2011-06-03 MED ORDER — OXYCODONE-ACETAMINOPHEN 5-325 MG PO TABS: 1.0000 | ORAL_TABLET | ORAL | Status: AC | PRN

## 2011-06-03 MED ORDER — OXYCODONE-ACETAMINOPHEN 5-325 MG PO TABS
1.0000 | ORAL_TABLET | Freq: Once | ORAL | Status: AC
  Administered 2011-06-03: 1 via ORAL
  Filled 2011-06-03: qty 1

## 2011-06-03 MED ORDER — ACETAMINOPHEN 325 MG PO TABS
325.0000 mg | ORAL_TABLET | Freq: Once | ORAL | Status: AC
  Administered 2011-06-03: 325 mg via ORAL
  Filled 2011-06-03: qty 1

## 2011-06-03 NOTE — ED Provider Notes (Signed)
Chief Complaint  Patient presents with  . Sore Throat    History of Present Illness:   The patient is a 51 year old male who has had a two-day history of severe sore throat, difficulty swallowing his own saliva, difficulty breathing, fever, and chills. [name] had some headache and nasal congestion. His neck is sore. He denies any coughing or wheezing. No nausea or vomiting. No known exposure to strep.  Review of Systems:  Other than noted above, the patient denies any of the following symptoms. Systemic:  No fever, chills, sweats, fatigue, myalgias, headache, or anorexia. Eye:  No redness, pain or drainage. ENT:  No earache, nasal congestion, rhinorrhea, sinus pressure, or sore throat. Lungs:  No cough, sputum production, wheezing, shortness of breath. Or chest pain. GI:  No nausea, vomiting, abdominal pain or diarrhea. Skin:  No rash or itching.  PMFSH:  Past medical history, family history, social history, meds, and allergies were reviewed.  Physical Exam:   Vital signs:  BP 116/76  Pulse 119  Temp(Src) 100.8 F (38.2 C) (Oral)  Resp 20  SpO2 98% General:  Alert, but in significant distress overall and specifically seems to have some respiratory distress with grunting respirations this patient in certain positions. He seems to be better leaning forward and worse leaning backward and additionally he is unable to swallow his saliva. Eye:  No conjunctival injection or drainage. ENT:  TMs and canals were normal, without erythema or inflammation.  Nasal mucosa was clear and uncongested, without drainage.  Mucous membranes were moist.  Tonsils were markedly enlarged, erythematous, necrotic, and covered with pus. The tonsils met at the midline and the airway appeared to be compromised.  There were no oral ulcerations or lesions. Neck:  Supple, his neck appears swollen and is very tender to touch. Lungs:  No respiratory distress.  Lungs were clear to auscultation, without wheezes, rales or  rhonchi.  Breath sounds were clear and equal bilaterally. Heart:  Regular rhythm, without gallops, murmers or rubs. Skin:  Clear, warm, and dry, without rash or lesions.  Labs:   Results for orders placed during the hospital encounter of 06/03/11  POCT RAPID STREP A ([facility]RG [facility] ONLY)      Component Value Range   Streptococcus, Group A Screen (Direct) POSITIVE (*) NEGATIVE      Radiology:  No results found.  Assessment:   Diagnoses that have been ruled out:  None  Diagnoses that are still under consideration:  None  Final diagnoses:  Peritonsillar abscess      Plan:   1.  The patient will be transferred to the emergency department by shuttle.   Reuben Likes, MD 06/03/11 2018

## 2011-06-03 NOTE — ED Provider Notes (Signed)
History     CSN: [id number]  Arrival date & time 06/03/11  2019   First MD Initiated Contact with Patient 06/03/11 2134      Chief Complaint  Patient presents with  . Sore Throat    (Consider location/radiation/quality/duration/timing/severity/associated sxs/prior treatment) HPI Comments: Patient had a sore throat for 2 days with low-grade fever went to urgent care and had a positive strep.  They were concerned for a tonsillar abscess and sent the patient to the emergency room.  Patient is a 51 y.o. male presenting with pharyngitis. The history is provided by the patient.  Sore Throat This is a new problem. The current episode started yesterday. The problem occurs constantly. The problem has been gradually worsening. Associated symptoms include a fever and a sore throat. Pertinent negatives include no congestion, coughing, headaches or weakness.    Past Medical History  Diagnosis Date  . Hypercholesteremia     History reviewed. No pertinent past surgical history.  No family history on file.  History  Substance Use Topics  . Smoking status: Current Everyday Smoker -- 1.0 packs/day  . Smokeless tobacco: Not on file  . Alcohol Use: No      Review of Systems  Constitutional: Positive for fever.  HENT: Positive for sore throat and trouble swallowing. Negative for congestion.   Respiratory: Negative for cough.   Neurological: Negative for dizziness, weakness and headaches.    Allergies  Review of patient's allergies indicates no known allergies.  Home Medications   Current Outpatient Rx  Name Route Sig Dispense Refill  . ASPIRIN 81 MG PO TABS Oral Take 81 mg by mouth every morning.     Marland Kitchen DICLOFENAC SODIUM 75 MG PO TBEC Oral Take 75 mg by mouth 2 (two) times daily.    . OXYCODONE-ACETAMINOPHEN 5-325 MG PO TABS Oral Take 1 tablet by mouth every 4 (four) hours as needed. For pain    . ROSUVASTATIN CALCIUM 10 MG PO TABS Oral Take 10 mg by mouth every morning.     Marland Kitchen  TRAMADOL HCL 50 MG PO TABS Oral Take 50 mg by mouth every 6 (six) hours as needed. For pain    . OXYCODONE-ACETAMINOPHEN 5-325 MG PO TABS Oral Take 1 tablet by mouth every 4 (four) hours as needed for pain. 15 tablet 0    BP 112/97  Pulse 116  Temp(Src) 101.3 F (38.5 C) (Oral)  Resp 24  SpO2 95%  Physical Exam  Constitutional: He is oriented to person, place, and time. He appears well-developed.  HENT:  Head: No trismus in the jaw.  Mouth/Throat: Uvula is midline. No uvula swelling. Oropharyngeal exudate and posterior oropharyngeal erythema present. No tonsillar abscesses.       Bilateral tonsillar a large mid uvula midline, slightly edematous.  No indication for posterior pharyngeal abscess  Eyes: Pupils are equal, round, and reactive to light.  Cardiovascular: Tachycardia present.   Musculoskeletal: Normal range of motion.  Neurological: He is alert and oriented to person, place, and time.  Skin: Skin is warm.    ED Course  Procedures (including critical care time)   Labs Reviewed  RAPID STREP SCREEN   No results found.   1. Strep pharyngitis       MDM  Strep pharyngitis, without indication for tonsillar abscess uvula is midline, not pointing slightly edematous.  Positive strep test at urgent care patient has opted for IM LA Bicillin as treatment of, choice.  We'll provide pain medication, as well as antipyretic  Arman Filter, NP 06/04/11 [phone]

## 2011-06-03 NOTE — ED Notes (Signed)
Pt sent to ED from Urgent Care with c/o sore throat and hard to swallow for 2 days.  Pt had + strep test at Urgent Care.

## 2011-06-03 NOTE — ED Notes (Signed)
PT. REPORTS SORE THROAT WITH SWELLING  "HARD TO SWALLOW" / PRODUCTIVE COUGH FOR 2 DAYS , DENIES FEVER OR CHILLS. RESPIRATIONS UNLABORED/ AIRWAY INTACT.

## 2011-06-03 NOTE — Discharge Instructions (Signed)
We have determined that your problem requires further evaluation in the emergency department.  We will take care of your transport there.  Once at the emergency department, you will be evaluated by a provider and they will order whatever treatment or tests they deem necessary.  We cannot guarantee that they will do any specific test or do any specific treatment.  ° °

## 2011-06-03 NOTE — ED Notes (Signed)
Pt states he has sorethroat, difficulty swallowing and fever for two days.

## 2011-06-03 NOTE — Discharge Instructions (Signed)
Strep Throat Strep throat is an infection of the throat caused by a bacteria named Streptococcus pyogenes. Your caregiver may call the infection streptococcal "tonsillitis" or "pharyngitis" depending on whether there are signs of inflammation in the tonsils or back of the throat. Strep throat is most common in children from 7 to 51 years old during the cold months of the year, but it can occur in people of any age during any season. This infection is spread from person to person (contagious) through coughing, sneezing, or other close contact. SYMPTOMS   Fever or chills.   Painful, swollen, red tonsils or throat.   Pain or difficulty when swallowing.   White or yellow spots on the tonsils or throat.   Swollen, tender lymph nodes or "glands" of the neck or under the jaw.   Red rash all over the body (rare).  DIAGNOSIS  Many different infections can cause the same symptoms. A test must be done to confirm the diagnosis so the right treatment can be given. A "rapid strep test" can help your caregiver make the diagnosis in a few minutes. If this test is not available, a light swab of the infected area can be used for a throat culture test. If a throat culture test is done, results are usually available in a day or two. TREATMENT  Strep throat is treated with antibiotic medicine. HOME CARE INSTRUCTIONS   Gargle with 1 tsp of salt in 1 cup of warm water, 3 to 4 times per day or as needed for comfort.   Family members who also have a sore throat or fever should be tested for strep throat and treated with antibiotics if they have the strep infection.   Make sure everyone in your household washes their hands well.   Do not share food, drinking cups, or personal items that could cause the infection to spread to others.   You may need to eat a soft food diet until your sore throat gets better.   Drink enough water and fluids to keep your urine clear or pale yellow. This will help prevent  dehydration.   Get plenty of rest.   Stay home from school, daycare, or work until you have been on antibiotics for 24 hours.   Only take over-the-counter or prescription medicines for pain, discomfort, or fever as directed by your caregiver.   If antibiotics are prescribed, take them as directed. Finish them even if you start to feel better.  SEEK MEDICAL CARE IF:   The glands in your neck continue to enlarge.   You develop a rash, cough, or earache.   You cough up green, yellow-brown, or bloody sputum.   You have pain or discomfort not controlled by medicines.   Your problems seem to be getting worse rather than better.  SEEK IMMEDIATE MEDICAL CARE IF:   You develop any new symptoms such as vomiting, severe headache, stiff or painful neck, chest pain, shortness of breath, or trouble swallowing.   You develop severe throat pain, drooling, or changes in your voice.   You develop swelling of the neck, or the skin on the neck becomes red and tender.   You have a fever.   You develop signs of dehydration, such as fatigue, dry mouth, and decreased urination.   You become increasingly sleepy, or you cannot wake up completely.  Document Released: 03/10/2000 Document Revised: 03/02/2011 Document Reviewed: 05/12/2010 [facility] Patient Information 2012 [place], [place]. Alternate taking the Percocet and ibuprofen.  I would not add  any additional Tylenol at this time to the Percocet.  Drink plenty of fluids, trying to stay hydrated.  He was given an IM injection of penicillin in the emergency department.  She will need no further antibiotics for your strep throat

## 2011-06-06 NOTE — ED Provider Notes (Signed)
Medical screening examination/treatment/procedure(s) were performed by non-physician practitioner and as supervising physician I was immediately available for consultation/collaboration.   Shawanda Sievert, MD 06/06/11 1130 

## 2011-07-21 ENCOUNTER — Encounter (HOSPITAL_COMMUNITY): Payer: Self-pay

## 2011-07-21 ENCOUNTER — Emergency Department (INDEPENDENT_AMBULATORY_CARE_PROVIDER_SITE_OTHER): Payer: Self-pay

## 2011-07-21 ENCOUNTER — Emergency Department (INDEPENDENT_AMBULATORY_CARE_PROVIDER_SITE_OTHER)
Admission: EM | Admit: 2011-07-21 | Discharge: 2011-07-21 | Disposition: A | Discharge status: Home / Self Care | Payer: Self-pay | Source: Home / Self Care | Attending: Family Medicine | Admitting: Family Medicine

## 2011-07-21 DIAGNOSIS — M65839 Other synovitis and tenosynovitis, unspecified forearm: Secondary | ICD-10-CM

## 2011-07-21 DIAGNOSIS — M65849 Other synovitis and tenosynovitis, unspecified hand: Secondary | ICD-10-CM

## 2011-07-21 DIAGNOSIS — M778 Other enthesopathies, not elsewhere classified: Secondary | ICD-10-CM

## 2011-07-21 MED ORDER — DICLOFENAC POTASSIUM 50 MG PO TABS: 50.0000 mg | ORAL_TABLET | Freq: Three times a day (TID) | ORAL | Status: DC

## 2011-07-21 NOTE — ED Notes (Signed)
C/o pain in rt wrist for 8 days.  Denies known injury.  Wife states the children told her he rolled out of bed.

## 2011-07-21 NOTE — Discharge Instructions (Signed)
Ice and medicine and wear splint for comfort as needed, see orthopedist if further problems.

## 2011-07-21 NOTE — ED Provider Notes (Signed)
History     CSN: [id number]  Arrival date & time 07/21/11  0932   First MD Initiated Contact with Patient 07/21/11 [phone]      Chief Complaint  Patient presents with  . Wrist Pain    (Consider location/radiation/quality/duration/timing/severity/associated sxs/prior treatment) Patient is a 51 y.o. male presenting with wrist pain. The history is provided by the patient and the spouse.  Wrist Pain This is a new problem. The current episode started more than 1 week ago. The problem occurs constantly. Exacerbated by: movement. The treatment provided no relief.    Past Medical History  Diagnosis Date  . Hypercholesteremia     Past Surgical History  Procedure Date  . Finger amputation     No family history on file.  History  Substance Use Topics  . Smoking status: Current Everyday Smoker -- 1.0 packs/day  . Smokeless tobacco: Not on file  . Alcohol Use: No      Review of Systems  Constitutional: Negative.   Musculoskeletal: Positive for arthralgias.    Allergies  Review of patient's allergies indicates no known allergies.  Home Medications   Current Outpatient Rx  Name Route Sig Dispense Refill  . ASPIRIN 81 MG PO TABS Oral Take 81 mg by mouth every morning.     Marland Kitchen DICLOFENAC SODIUM 75 MG PO TBEC Oral Take 75 mg by mouth 2 (two) times daily.    . OXYCODONE-ACETAMINOPHEN 5-325 MG PO TABS Oral Take 1 tablet by mouth every 4 (four) hours as needed. For pain    . ROSUVASTATIN CALCIUM 10 MG PO TABS Oral Take 10 mg by mouth every morning.     Marland Kitchen TRAMADOL HCL 50 MG PO TABS Oral Take 50 mg by mouth every 6 (six) hours as needed. For pain    . DICLOFENAC POTASSIUM 50 MG PO TABS Oral Take 1 tablet (50 mg total) by mouth 3 (three) times daily. As needed for pain 30 tablet 0    BP 128/68  Pulse 87  Temp(Src) 98 F (36.7 C) (Oral)  Resp 18  SpO2 96%  Physical Exam  Nursing note and vitals reviewed. Constitutional: He is oriented to person, place, and time. He appears  well-developed and well-nourished.  Musculoskeletal: He exhibits tenderness.       Right wrist: He exhibits tenderness. He exhibits no swelling.       Arms: Neurological: He is alert and oriented to person, place, and time.  Skin: Skin is warm and dry.  Psychiatric: He has a normal mood and affect.    ED Course  Procedures (including critical care time)  Labs Reviewed - No data to display Dg Wrist Complete Right  07/21/2011  *RADIOLOGY REPORT*  Clinical Data: Right wrist pain for 8 days.  No injury.  RIGHT WRIST - COMPLETE 3+ VIEW  Comparison: None.  Findings: Imaged bones, joints and soft tissues appear normal.  IMPRESSION: Negative exam.  Original Report Authenticated By: Bernadene Bell. D'ALESSIO, M.D.     1. Tendonitis of wrist, right       MDM  X-rays reviewed and report per radiologist.         Linna Hoff, MD 07/21/11 [phone]

## 2011-10-22 ENCOUNTER — Observation Stay (HOSPITAL_COMMUNITY)
Admission: EM | Admit: 2011-10-22 | Discharge: 2011-10-23 | Disposition: A | Discharge status: Home / Self Care | Payer: Self-pay | Attending: Internal Medicine | Admitting: Internal Medicine

## 2011-10-22 ENCOUNTER — Encounter (HOSPITAL_COMMUNITY): Payer: Self-pay | Admitting: *Deleted

## 2011-10-22 ENCOUNTER — Emergency Department (HOSPITAL_COMMUNITY): Payer: Self-pay

## 2011-10-22 ENCOUNTER — Other Ambulatory Visit: Payer: Self-pay

## 2011-10-22 DIAGNOSIS — R079 Chest pain, unspecified: Principal | ICD-10-CM | POA: Insufficient documentation

## 2011-10-22 DIAGNOSIS — E785 Hyperlipidemia, unspecified: Secondary | ICD-10-CM | POA: Insufficient documentation

## 2011-10-22 DIAGNOSIS — F17200 Nicotine dependence, unspecified, uncomplicated: Secondary | ICD-10-CM | POA: Insufficient documentation

## 2011-10-22 DIAGNOSIS — J42 Unspecified chronic bronchitis: Secondary | ICD-10-CM | POA: Insufficient documentation

## 2011-10-22 DIAGNOSIS — E669 Obesity, unspecified: Secondary | ICD-10-CM | POA: Diagnosis present

## 2011-10-22 DIAGNOSIS — R1310 Dysphagia, unspecified: Secondary | ICD-10-CM | POA: Insufficient documentation

## 2011-10-22 DIAGNOSIS — Z7902 Long term (current) use of antithrombotics/antiplatelets: Secondary | ICD-10-CM | POA: Insufficient documentation

## 2011-10-22 HISTORY — DX: Essential (primary) hypertension: I10

## 2011-10-22 HISTORY — DX: Unspecified chronic bronchitis: J42

## 2011-10-22 LAB — CBC WITH DIFFERENTIAL/PLATELET
Basophils Absolute: 0 10*3/uL (ref 0.0–0.1)
Basophils Relative: 0 % (ref 0–1)
Eosinophils Absolute: 0.2 10*3/uL (ref 0.0–0.7)
Eosinophils Relative: 2 % (ref 0–5)
HCT: 42.5 % (ref 39.0–52.0)
Hemoglobin: 14.1 g/dL (ref 13.0–17.0)
Lymphocytes Relative: 48 % — ABNORMAL HIGH (ref 12–46)
Lymphs Abs: 3.9 10*3/uL (ref 0.7–4.0)
MCH: 26.9 pg (ref 26.0–34.0)
MCHC: 33.2 g/dL (ref 30.0–36.0)
MCV: 81.1 fL (ref 78.0–100.0)
Monocytes Absolute: 0.7 10*3/uL (ref 0.1–1.0)
Monocytes Relative: 9 % (ref 3–12)
Neutro Abs: 3.4 10*3/uL (ref 1.7–7.7)
Neutrophils Relative %: 42 % — ABNORMAL LOW (ref 43–77)
Platelets: 265 10*3/uL (ref 150–400)
RBC: 5.24 MIL/uL (ref 4.22–5.81)
RDW: 15.8 % — ABNORMAL HIGH (ref 11.5–15.5)
WBC: 8.3 10*3/uL (ref 4.0–10.5)

## 2011-10-22 LAB — POCT I-STAT, CHEM 8
BUN: 11 mg/dL (ref 6–23)
Calcium, Ion: 1.22 mmol/L (ref 1.12–1.23)
Chloride: 106 mEq/L (ref 96–112)
Creatinine, Ser: 1.1 mg/dL (ref 0.50–1.35)
Glucose, Bld: 102 mg/dL — ABNORMAL HIGH (ref 70–99)
HCT: 47 % (ref 39.0–52.0)
Hemoglobin: 16 g/dL (ref 13.0–17.0)
Potassium: 4 mEq/L (ref 3.5–5.1)
Sodium: 142 mEq/L (ref 135–145)
TCO2: 25 mmol/L (ref 0–100)

## 2011-10-22 LAB — POCT I-STAT TROPONIN I: Troponin i, poc: 0 ng/mL (ref 0.00–0.08)

## 2011-10-22 LAB — PRO B NATRIURETIC PEPTIDE: Pro B Natriuretic peptide (BNP): 6.1 pg/mL (ref 0–125)

## 2011-10-22 MED ORDER — ASPIRIN 81 MG PO CHEW
324.0000 mg | CHEWABLE_TABLET | Freq: Once | ORAL | Status: AC
  Administered 2011-10-22: 243 mg via ORAL
  Filled 2011-10-22: qty 4

## 2011-10-22 NOTE — ED Notes (Addendum)
Denies hx other than high cholesterol, given ntg in past by [facility] & [facility]. C/o CP onset 1 week ago, comes and goes, worse when he eats something, better after ntg, last ntg 1200noon, also reports sob,dizziness. (Denies: fever, cough, congestion, recent illness, back pain, radiation or other sx). After triage with further questioning pt reports heart blockages.

## 2011-10-22 NOTE — ED Notes (Addendum)
Dr. McManus at bedside. 

## 2011-10-22 NOTE — ED Notes (Addendum)
Pt reports chest pain and SOB x 1 week. Denies any significant changes. States "My sister and niece made me come in today". Mid sternal pain. Non radiating. 6/10. Reports hxt of "2 blockages in his heart 2-3 years ago, that they did not do surgery on". Currently takes 1 aspirin and 1 sublingual nitro everyday. States "when the pain gets bad my vision gets blurry. And it is worse when I eat". Complains of SOB with exertion and at rest. Denies headache. Denies nausea/vomiting. Denies any other pain. SpO2 91% on RA. Placed on 2L [place]. Currently SpO2 97% on 2L [place]. Respirations even, and labored when speaking. Sitting up in bed. Skin warm, dry, and intact. Vitals stable. A.O. X4.

## 2011-10-22 NOTE — ED Notes (Addendum)
Labs drawn, pt to xray. EKG reviewed.

## 2011-10-23 ENCOUNTER — Encounter (HOSPITAL_COMMUNITY): Payer: Self-pay | Admitting: Emergency Medicine

## 2011-10-23 DIAGNOSIS — E669 Obesity, unspecified: Secondary | ICD-10-CM

## 2011-10-23 DIAGNOSIS — E785 Hyperlipidemia, unspecified: Secondary | ICD-10-CM | POA: Diagnosis present

## 2011-10-23 DIAGNOSIS — R079 Chest pain, unspecified: Secondary | ICD-10-CM | POA: Diagnosis present

## 2011-10-23 DIAGNOSIS — R1310 Dysphagia, unspecified: Secondary | ICD-10-CM | POA: Diagnosis present

## 2011-10-23 LAB — CARDIAC PANEL(CRET KIN+CKTOT+MB+TROPI)
CK, MB: 2.9 ng/mL (ref 0.3–4.0)
CK, MB: 3.1 ng/mL (ref 0.3–4.0)
CK, MB: 2.8 ng/mL (ref 0.3–4.0)
Relative Index: 0.7 (ref 0.0–2.5)
Relative Index: 0.8 (ref 0.0–2.5)
Relative Index: 0.8 (ref 0.0–2.5)
Total CK: 418 U/L — ABNORMAL HIGH (ref 7–232)
Total CK: 382 U/L — ABNORMAL HIGH (ref 7–232)
Total CK: 344 U/L — ABNORMAL HIGH (ref 7–232)
Troponin I: <0.3 ng/mL (ref <0.30)
Troponin I: <0.3 ng/mL (ref <0.30)
Troponin I: <0.3 ng/mL (ref <0.30)

## 2011-10-23 LAB — COMPREHENSIVE METABOLIC PANEL
ALT: 25 U/L (ref 0–53)
AST: 20 U/L (ref 0–37)
Albumin: 3.2 g/dL — ABNORMAL LOW (ref 3.5–5.2)
Alkaline Phosphatase: 46 U/L (ref 39–117)
BUN: 11 mg/dL (ref 6–23)
CO2: 25 mEq/L (ref 19–32)
Calcium: 8.8 mg/dL (ref 8.4–10.5)
Chloride: 105 mEq/L (ref 96–112)
Creatinine, Ser: 0.8 mg/dL (ref 0.50–1.35)
GFR calc Af Amer: >90 mL/min (ref >90)
GFR calc non Af Amer: >90 mL/min (ref >90)
Glucose, Bld: 120 mg/dL — ABNORMAL HIGH (ref 70–99)
Potassium: 3.5 mEq/L (ref 3.5–5.1)
Sodium: 139 mEq/L (ref 135–145)
Total Bilirubin: 0.2 mg/dL — ABNORMAL LOW (ref 0.3–1.2)
Total Protein: 7.1 g/dL (ref 6.0–8.3)

## 2011-10-23 LAB — D-DIMER, QUANTITATIVE: D-Dimer, Quant: 0.42 ug/mL-FEU (ref 0.00–0.48)

## 2011-10-23 LAB — CBC
HCT: 40.1 % (ref 39.0–52.0)
Hemoglobin: 13.2 g/dL (ref 13.0–17.0)
MCH: 26.5 pg (ref 26.0–34.0)
MCHC: 32.9 g/dL (ref 30.0–36.0)
MCV: 80.4 fL (ref 78.0–100.0)
Platelets: 249 10*3/uL (ref 150–400)
RBC: 4.99 MIL/uL (ref 4.22–5.81)
RDW: 15.8 % — ABNORMAL HIGH (ref 11.5–15.5)
WBC: 6.4 10*3/uL (ref 4.0–10.5)

## 2011-10-23 LAB — LIPASE, BLOOD: Lipase: 34 U/L (ref 11–59)

## 2011-10-23 MED ORDER — SODIUM CHLORIDE 0.9 % IV SOLN
INTRAVENOUS | Status: AC
  Administered 2011-10-23: 03:00:00 via INTRAVENOUS

## 2011-10-23 MED ORDER — SODIUM CHLORIDE 0.9 % IJ SOLN
3.0000 mL | Freq: Two times a day (BID) | INTRAMUSCULAR | Status: DC
  Administered 2011-10-23: 3 mL via INTRAVENOUS

## 2011-10-23 MED ORDER — NITROGLYCERIN 0.4 MG SL SUBL: 0.4000 mg | SUBLINGUAL_TABLET | SUBLINGUAL | Status: DC | PRN

## 2011-10-23 MED ORDER — ACETAMINOPHEN 325 MG PO TABS: 650.0000 mg | ORAL_TABLET | Freq: Four times a day (QID) | ORAL | Status: DC | PRN

## 2011-10-23 MED ORDER — FAMOTIDINE 10 MG PO TABS
10.0000 mg | ORAL_TABLET | Freq: Two times a day (BID) | ORAL | Status: DC
  Administered 2011-10-23: 10 mg via ORAL
  Filled 2011-10-23 (×2): qty 1

## 2011-10-23 MED ORDER — TRAMADOL HCL 50 MG PO TABS: 50.0000 mg | ORAL_TABLET | Freq: Four times a day (QID) | ORAL | Status: DC | PRN

## 2011-10-23 MED ORDER — ATORVASTATIN CALCIUM 20 MG PO TABS
20.0000 mg | ORAL_TABLET | Freq: Every day | ORAL | Status: DC
  Filled 2011-10-23: qty 1

## 2011-10-23 MED ORDER — ACETAMINOPHEN 650 MG RE SUPP: 650.0000 mg | Freq: Four times a day (QID) | RECTAL | Status: DC | PRN

## 2011-10-23 MED ORDER — SODIUM CHLORIDE 0.9 % IJ SOLN: 3.0000 mL | Freq: Two times a day (BID) | INTRAMUSCULAR | Status: DC

## 2011-10-23 MED ORDER — ENOXAPARIN SODIUM 40 MG/0.4ML ~~LOC~~ SOLN
40.0000 mg | SUBCUTANEOUS | Status: DC
  Administered 2011-10-23: 40 mg via SUBCUTANEOUS
  Filled 2011-10-23: qty 0.4

## 2011-10-23 MED ORDER — ONDANSETRON HCL 4 MG/2ML IJ SOLN: 4.0000 mg | Freq: Three times a day (TID) | INTRAMUSCULAR | Status: DC | PRN

## 2011-10-23 MED ORDER — ONDANSETRON HCL 4 MG PO TABS: 4.0000 mg | ORAL_TABLET | Freq: Four times a day (QID) | ORAL | Status: DC | PRN

## 2011-10-23 MED ORDER — ALBUTEROL SULFATE HFA 108 (90 BASE) MCG/ACT IN AERS: 2.0000 | INHALATION_SPRAY | RESPIRATORY_TRACT | Status: DC | PRN

## 2011-10-23 MED ORDER — ASPIRIN EC 325 MG PO TBEC
325.0000 mg | DELAYED_RELEASE_TABLET | Freq: Every day | ORAL | Status: DC
  Administered 2011-10-23: 325 mg via ORAL
  Filled 2011-10-23: qty 1

## 2011-10-23 MED ORDER — ONDANSETRON HCL 4 MG/2ML IJ SOLN: 4.0000 mg | Freq: Four times a day (QID) | INTRAMUSCULAR | Status: DC | PRN

## 2011-10-23 NOTE — ED Notes (Signed)
Dr. Kakrakandy at bedside. 

## 2011-10-23 NOTE — Progress Notes (Signed)
Reviewed discharge instructions with patient and he stated his understanding.  Patient stated he did not need prescriptions for any of his medications. Also stated his ride was not here, and he would be riding the bus home.  Colman Cater

## 2011-10-23 NOTE — Care Management Note (Signed)
    Page 1 of 1   10/23/2011     12:27:25 PM   CARE MANAGEMENT NOTE 10/23/2011  Patient:  Kenneth [name], Kenneth [name]   Account Number:  [id number]  Date Initiated:  10/23/2011  Documentation initiated by:  GRAVES-BIGELOW,Collie Kittel  Subjective/Objective Assessment:   Pt admitted with cp. Pt states he lives with his sister-n-law and she helps him out with medications. Pt did state that he has an orange card and had not been [facility] due to hard to make an appointment date.     Action/Plan:   CM did make pt [name] f/u hospital appointment. CM did provide pt with information on [facility] and flat rate fee of65.00. CM alo provided pt with lstings of PCP accepting new pts. CM did state he would benefit from [facility].   Anticipated DC Date:  10/23/2011   Anticipated DC Plan:  HOME/SELF CARE      DC Planning Services  CM consult  Follow-up appt scheduled      Choice offered to / List presented to:             Status of service:  Completed, signed off Medicare Important Message given?   (If response is "NO", the following Medicare IM given date fields will be blank) Date Medicare IM given:   Date Additional Medicare IM given:    Discharge Disposition:  HOME/SELF CARE  Per UR Regulation:  Reviewed for med. necessity/level of care/duration of stay  If discussed at Long Length of Stay Meetings, dates discussed:    Comments:  [facility] Follow Up Appointment: [facility]: [phone] [facility] Friday November 17, 2011 @ 10:30 am with MD Drue Second.

## 2011-10-23 NOTE — ED Notes (Signed)
Patient currently sitting up in bed; no respiratory or acute distress noted.  Patient updated on plan of care; informed patient that we are currently waiting on admitting MD to come and see patient.  Patient requesting something to drink; informed patient that he is NPO and cannot have anything to eat or drink at this time.  Patient has no there questions or concerns; will continue to monitor.

## 2011-10-23 NOTE — ED Notes (Signed)
Patient being transported upstairs on portable cardiac monitor with RN. 

## 2011-10-23 NOTE — Progress Notes (Signed)
  Echocardiogram 2D Echocardiogram has been performed.  Jorje Guild 10/23/2011, 9:17 AM

## 2011-10-23 NOTE — Progress Notes (Signed)
Subjective: Denies any chest pain or shortness of breath.  No other specific complaints.  Objective: Vital signs in last 24 hours: Filed Vitals:   10/22/11 2328 10/23/11 0008 10/23/11 0140 10/23/11 0500  BP: 117/67 117/70 146/91 109/65  Pulse: 75 81 76 73  Temp:  97.8 F (36.6 C) 97.8 F (36.6 C) 98.2 F (36.8 C)  TempSrc:  Oral Oral Oral  Resp: 20 21 18 18   Height: 5\' 8"  (1.727 m)     Weight: 136.079 kg (300 lb)  136.986 kg (302 lb)   SpO2: 99% 98% 98% 96%   Weight change:   Intake/Output Summary (Last 24 hours) at 10/23/11 0912 Last data filed at 10/23/11 0800  Gross per 24 hour  Intake      0 ml  Output      0 ml  Net      0 ml    Physical Exam: General: Awake, Oriented, No acute distress. HEENT: EOMI. Neck: Supple CV: S1 and S2 Lungs: Clear to ascultation bilaterally Abdomen: Soft, Nontender, Nondistended, +bowel sounds. Ext: Good pulses. Trace edema.  Lab Results: Basic Metabolic Panel:  Lab 10/23/11 1191 10/22/11 2208  NA 139 142  K 3.5 4.0  CL 105 106  CO2 25 --  GLUCOSE 120* 102*  BUN 11 11  CREATININE 0.80 1.10  CALCIUM 8.8 --  MG -- --  PHOS -- --   Liver Function Tests:  Lab 10/23/11 0635  AST 20  ALT 25  ALKPHOS 46  BILITOT 0.2*  PROT 7.1  ALBUMIN 3.2*    Lab 10/23/11 0635  LIPASE 34  AMYLASE --   No results found for this basename: AMMONIA:5 in the last 168 hours CBC:  Lab 10/23/11 0635 10/22/11 2208 10/22/11 2200  WBC 6.4 -- 8.3  NEUTROABS -- -- 3.4  HGB 13.2 16.0 14.1  HCT 40.1 47.0 42.5  MCV 80.4 -- 81.1  PLT 249 -- 265   Cardiac Enzymes:  Lab 10/23/11 0215  CKTOTAL 418*  CKMB 2.9  CKMBINDEX --  TROPONINI <0.30   BNP (last 3 results)  Basename 10/22/11 2200  PROBNP 6.1   CBG: No results found for this basename: GLUCAP:5 in the last 168 hours No results found for this basename: HGBA1C:5 in the last 72 hours Other Labs: No components found with this basename: POCBNP:3  Lab 10/23/11 0635  DDIMER 0.42    No results found for this basename: CHOL:2,HDL:2,LDLCALC:2,TRIG:2,CHOLHDL:2,LDLDIRECT:2 in the last 168 hours No results found for this basename: TSH,T4TOTAL,FREET3,T3FREE,FREET4,THYROIDAB in the last 168 hours No results found for this basename: VITAMINB12:2,FOLATE:2,FERRITIN:2,TIBC:2,IRON:2,RETICCTPCT:2 in the last 168 hours  Micro Results: No results found for this or any previous visit (from the past 240 hour(s)).  Studies/Results: Dg Chest 2 View  10/22/2011  *RADIOLOGY REPORT*  Clinical Data: Shortness of breath and chest pain for 1 week. History of hypertension, stent.  History of smoking.  CHEST - 2 VIEW  Comparison: 07/25/2010  Findings: There is elevation of the right hemidiaphragm.  Heart size is normal.  Perihilar bronchitic changes are present.  There are no focal consolidations or pleural effusions. Visualized osseous structures have [name] normal appearance.  IMPRESSION:  1.  Bronchitic changes. 2. No evidence for acute pulmonary abnormality.  Original Report Authenticated By: Patterson Hammersmith, M.D.    Medications: I have reviewed the patient's current medications. Scheduled Meds:   . sodium chloride   Intravenous STAT  . aspirin  324 mg Oral Once  . aspirin EC  325 mg Oral Daily  .  atorvastatin  20 mg Oral q1800  . enoxaparin (LOVENOX) injection  40 mg Subcutaneous Q24H  . famotidine  10 mg Oral BID  . sodium chloride  3 mL Intravenous Q12H  . sodium chloride  3 mL Intravenous Q12H   Continuous Infusions:  PRN Meds:.acetaminophen, acetaminophen, albuterol, nitroGLYCERIN, ondansetron (ZOFRAN) IV, ondansetron, traMADol, DISCONTD: ondansetron (ZOFRAN) IV  Assessment/Plan: Chest pain rule out ACS  Cycle cardiac enzymes, initial cardiac enzymes negative. Patient does complain of symptoms of dysphagia, likely will need outpatient GI evaluation.  D-dimer, normal, indicating low likely that for thromboembolic event.  2-D echocardiogram pending.  Patient reports that he is  scheduled to have [name] cardiac catheter at [facility] [place] [place].  Patient's last cardiac catheter in 04/30/2009 showed no flow limiting coronary artery disease, normal left ventricular ejection fraction.  Dysphagia? Likely will make further evaluation as outpatient.  Hyperlipidemia  Continue present medications.  Prophylaxis Lovenox.  CODE STATUS Full code.   LOS: 1 day  Kenneth Weems A, MD 10/23/2011, 9:12 AM

## 2011-10-23 NOTE — H&P (Addendum)
Kenneth [name] is an 51 y.o. male.  Patient was seen and examined on July 29 1 AM. PCP - patient goes to an urgent care in [facility].  Chief Complaint: Chest pain. HPI: 51 year-old male with history of hyperlipidemia presented the ER because of chest pain. Chest pain is retrosternal and increase in exertion and on swallowing. Sometimes he gets short of breath. He also noticed recently he is having some collection is swelling for which he was started on Lasix. Presently chest pain-free. Cardiac enzymes and EKG are unremarkable. Patient has been admitted for further management. Patient had a similar episode last month and had gone to [facility]. Over there he is following up with a physician was planning to do cardiac catheter in the next few weeks. Patient has had a cardiac catheter by Dr. Anne Fu in 2011 which was unremarkable. Reports are available in the Epic. Patient otherwise denies any nausea vomiting diaphoresis dizziness palpitations.  Past Medical History  Diagnosis Date  . Hypercholesteremia   . Coronary artery disease   . Hypertension   . Chronic bronchitis     Past Surgical History  Procedure Date  . Finger amputation     Family History  Problem Relation Age of Onset  . Hypertension Father    Social History:  reports that he has been smoking.  He does not have any smokeless tobacco history on file. He reports that he drinks alcohol. He reports that he does not use illicit drugs.  Allergies: No Known Allergies  Medications Prior to Admission  Medication Sig Dispense Refill  . albuterol (PROVENTIL HFA;VENTOLIN HFA) 108 (90 BASE) MCG/ACT inhaler Inhale 2 puffs into the lungs every 4 (four) hours as needed. For shortness of breath      . aspirin 81 MG tablet Take 81 mg by mouth every morning.       . diclofenac (CATAFLAM) 50 MG tablet Take 50 mg by mouth 3 (three) times daily as needed. For pain      . diclofenac (VOLTAREN) 75 MG EC tablet Take 75 mg by mouth 2 (two)  times daily.      . famotidine (PEPCID) 10 MG tablet Take 10 mg by mouth 2 (two) times daily.      . furosemide (LASIX) 20 MG tablet Take 20 mg by mouth daily.      . nitroGLYCERIN (NITROSTAT) 0.4 MG SL tablet Place 0.4 mg under the tongue every 5 (five) minutes as needed. For chest pain      . rosuvastatin (CRESTOR) 10 MG tablet Take 10 mg by mouth every morning.       . traMADol (ULTRAM) 50 MG tablet Take 50 mg by mouth every 6 (six) hours as needed. For pain        Results for orders placed during the hospital encounter of 10/22/11 (from the past 48 hour(s))  PRO B NATRIURETIC PEPTIDE     Status: Normal   Collection Time   10/22/11 10:00 PM      Component Value Range Comment   Pro B Natriuretic peptide (BNP) 6.1  0 - 125 pg/mL   CBC WITH DIFFERENTIAL     Status: Abnormal   Collection Time   10/22/11 10:00 PM      Component Value Range Comment   WBC 8.3  4.0 - 10.5 K/uL    RBC 5.24  4.22 - 5.81 MIL/uL    Hemoglobin 14.1  13.0 - 17.0 g/dL    HCT 86.5  78.4 -  52.0 %    MCV 81.1  78.0 - 100.0 fL    MCH 26.9  26.0 - 34.0 pg    MCHC 33.2  30.0 - 36.0 g/dL    RDW 16.1 (*) 09.6 - 15.5 %    Platelets 265  150 - 400 K/uL    Neutrophils Relative 42 (*) 43 - 77 %    Neutro Abs 3.4  1.7 - 7.7 K/uL    Lymphocytes Relative 48 (*) 12 - 46 %    Lymphs Abs 3.9  0.7 - 4.0 K/uL    Monocytes Relative 9  3 - 12 %    Monocytes Absolute 0.7  0.1 - 1.0 K/uL    Eosinophils Relative 2  0 - 5 %    Eosinophils Absolute 0.2  0.0 - 0.7 K/uL    Basophils Relative 0  0 - 1 %    Basophils Absolute 0.0  0.0 - 0.1 K/uL   POCT I-STAT TROPONIN I     Status: Normal   Collection Time   10/22/11 10:06 PM      Component Value Range Comment   Troponin i, poc 0.00  0.00 - 0.08 ng/mL    Comment 3            POCT I-STAT, CHEM 8     Status: Abnormal   Collection Time   10/22/11 10:08 PM      Component Value Range Comment   Sodium 142  135 - 145 mEq/L    Potassium 4.0  3.5 - 5.1 mEq/L    Chloride 106  96 - 112 mEq/L     BUN 11  6 - 23 mg/dL    Creatinine, Ser 0.45  0.50 - 1.35 mg/dL    Glucose, Bld 409 (*) 70 - 99 mg/dL    Calcium, Ion 8.11  9.14 - 1.23 mmol/L    TCO2 25  0 - 100 mmol/L    Hemoglobin 16.0  13.0 - 17.0 g/dL    HCT 78.2  95.6 - 21.3 %   CARDIAC PANEL(CRET KIN+CKTOT+MB+TROPI)     Status: Abnormal   Collection Time   10/23/11  2:15 AM      Component Value Range Comment   Total CK 418 (*) 7 - 232 U/L    CK, MB 2.9  0.3 - 4.0 ng/mL    Troponin I <0.30  <0.30 ng/mL    Relative Index 0.7  0.0 - 2.5    Dg Chest 2 View  10/22/2011  *RADIOLOGY REPORT*  Clinical Data: Shortness of breath and chest pain for 1 week. History of hypertension, stent.  History of smoking.  CHEST - 2 VIEW  Comparison: 07/25/2010  Findings: There is elevation of the right hemidiaphragm.  Heart size is normal.  Perihilar bronchitic changes are present.  There are no focal consolidations or pleural effusions. Visualized osseous structures have a normal appearance.  IMPRESSION:  1.  Bronchitic changes. 2. No evidence for acute pulmonary abnormality.  Original Report Authenticated By: Patterson Hammersmith, M.D.    Review of Systems  Constitutional: Negative.   HENT: Negative.   Eyes: Negative.   Respiratory: Negative.   Cardiovascular: Positive for chest pain.  Gastrointestinal:       Difficulty swallowing.  Genitourinary: Negative.   Musculoskeletal: Negative.   Skin: Negative.   Neurological: Negative.   Endo/Heme/Allergies: Negative.   Psychiatric/Behavioral: Negative.     Blood pressure 146/91, pulse 76, temperature 97.8 F (36.6 C), temperature source Oral, resp. rate 18,  height 5\' 8"  (1.727 m), weight 136.986 kg (302 lb), SpO2 98.00%. Physical Exam  Constitutional: He is oriented to person, place, and time. He appears well-developed.       Obese.  HENT:  Head: Normocephalic and atraumatic.  Right Ear: External ear normal.  Left Ear: External ear normal.  Nose: Nose normal.  Mouth/Throat: Oropharynx is  clear and moist. No oropharyngeal exudate.  Eyes: Conjunctivae are normal. Pupils are equal, round, and reactive to light. Right eye exhibits no discharge. Left eye exhibits no discharge. No scleral icterus.  Neck: Normal range of motion. Neck supple.  Cardiovascular: Normal rate and regular rhythm.   Respiratory: Effort normal and breath sounds normal. No respiratory distress. He has no wheezes. He has no rales.  GI: Soft. Bowel sounds are normal. He exhibits no distension. There is no tenderness. There is no rebound.  Musculoskeletal: Normal range of motion. He exhibits no edema and no tenderness.  Neurological: He is alert and oriented to person, place, and time.       Moves all extremities.  Skin: Skin is warm and dry.  Psychiatric: His behavior is normal.     Assessment/Plan #1. Chest pain rule out ACS - cycle cardiac markers. Patient does complain of symptoms of dysphagia. If cardiac workup is negative then will need GI workup given that patient takes diclofenac. Check d-dimer. #2. Dysphagia - see #1. #3. Hyperlipidemia - continue present medications.  CODE STATUS - full code.  Nickoli Bagheri N. 10/23/2011, 3:25 AM

## 2011-10-23 NOTE — ED Provider Notes (Signed)
History     CSN: [id number]  Arrival date & time 10/22/11  2146   First MD Initiated Contact with Patient 10/22/11 2327      Chief Complaint  Patient presents with  . Chest Pain  . Shortness of Breath     HPI Pt was seen at 2330.  Per pt, c/o gradual onset and persistence of multiple intermittent episodes of chest "pain" that has been occurring for the past 2 to 3 weeks.  States pain is located mid-sternal area, describes the pain as "heaviness" and "pressure."  Pt states his CP worsens with eating and exertion; improves with resting.  CP on exertion is associated with SOB.  States he has taken his own SL ntg with relief.  Pt states he was eval by a Cardiologist in [place] 3 weeks ago for these symptoms, states the doctor "told me I needed a cardiac catheterization."  Denies palpitations, no cough, no back pain, no abd pain, no N/V/D, no fevers.    Past Medical History  Diagnosis Date  . Hypercholesteremia   . Coronary artery disease   . Hypertension   . Chronic bronchitis     Past Surgical History  Procedure Date  . Finger amputation     Family History  Problem Relation Age of Onset  . Hypertension Father     History  Substance Use Topics  . Smoking status: Current Everyday Smoker -- 1.0 packs/day  . Smokeless tobacco: Not on file  . Alcohol Use: Yes     some    Review of Systems ROS: Statement: All systems negative except as marked or noted in the HPI; Constitutional: Negative for fever and chills. ; ; Eyes: Negative for eye pain, redness and discharge. ; ; ENMT: Negative for ear pain, hoarseness, nasal congestion, sinus pressure and sore throat. ; ; Cardiovascular: +CP, SOB. Negative for palpitations, diaphoresis, and peripheral edema. ; ; Respiratory: Negative for cough, wheezing and stridor. ; ; Gastrointestinal: Negative for nausea, vomiting, diarrhea, abdominal pain, blood in stool, hematemesis, jaundice and rectal bleeding. . ; ; Genitourinary: Negative for  dysuria, flank pain and hematuria. ; ; Musculoskeletal: Negative for back pain and neck pain. Negative for swelling and trauma.; ; Skin: Negative for pruritus, rash, abrasions, blisters, bruising and skin lesion.; ; Neuro: Negative for headache, lightheadedness and neck stiffness. Negative for weakness, altered level of consciousness , altered mental status, extremity weakness, paresthesias, involuntary movement, seizure and syncope.       Allergies  Review of patient's allergies indicates no known allergies.  Home Medications   Current Outpatient Rx  Name Route Sig Dispense Refill  . ALBUTEROL SULFATE HFA 108 (90 BASE) MCG/ACT IN AERS Inhalation Inhale 2 puffs into the lungs every 4 (four) hours as needed. For shortness of breath    . ASPIRIN 81 MG PO TABS Oral Take 81 mg by mouth every morning.     Marland Kitchen DICLOFENAC POTASSIUM 50 MG PO TABS Oral Take 50 mg by mouth 3 (three) times daily as needed. For pain    . DICLOFENAC SODIUM 75 MG PO TBEC Oral Take 75 mg by mouth 2 (two) times daily.    Marland Kitchen FAMOTIDINE 10 MG PO TABS Oral Take 10 mg by mouth 2 (two) times daily.    . FUROSEMIDE 20 MG PO TABS Oral Take 20 mg by mouth daily.    Marland Kitchen NITROGLYCERIN 0.4 MG SL SUBL Sublingual Place 0.4 mg under the tongue every 5 (five) minutes as needed. For chest pain    .  ROSUVASTATIN CALCIUM 10 MG PO TABS Oral Take 10 mg by mouth every morning.     Marland Kitchen TRAMADOL HCL 50 MG PO TABS Oral Take 50 mg by mouth every 6 (six) hours as needed. For pain      BP 117/70  Pulse 81  Temp 97.8 F (36.6 C) (Oral)  Resp 21  Ht 5\' 8"  (1.727 m)  Wt 300 lb (136.079 kg)  BMI 45.61 kg/m2  SpO2 98%  Physical Exam 2335: Physical examination:  Nursing notes reviewed; Vital signs and O2 SAT reviewed;  Constitutional: Well developed, Well nourished, Well hydrated, In no acute distress; Head:  Normocephalic, atraumatic; Eyes: EOMI, PERRL, No scleral icterus; ENMT: Mouth and pharynx normal, Mucous membranes moist; Neck: Supple, Full range  of motion, No lymphadenopathy; Cardiovascular: Regular rate and rhythm, No gallop; Respiratory: Breath sounds clear & equal bilaterally, No wheezes.  Speaking full sentences with ease, Normal respiratory effort/excursion; Chest: Nontender, Movement normal; Abdomen: Soft, Nontender, Nondistended, Normal bowel sounds;; Extremities: Pulses normal, No tenderness, No edema, No calf edema or asymmetry.; Neuro: AA&Ox3, Major CN grossly intact.  Speech clear. No gross focal motor or sensory deficits in extremities.; Skin: Color normal, Warm, Dry.   ED Course  Procedures    MDM  MDM Reviewed: nursing note, vitals and previous chart Interpretation: labs, ECG and x-ray    Date: 10/23/2011  Rate: 88  Rhythm: normal sinus rhythm  QRS Axis: left  Intervals: normal  ST/T Wave abnormalities: nonspecific T wave changes, TWI leads I and aVL  Conduction Disutrbances:nonspecific intraventricular conduction delay  Narrative Interpretation: LVH  Old EKG Reviewed: none available.    Results for orders placed during the hospital encounter of 10/22/11  PRO B NATRIURETIC PEPTIDE      Component Value Range   Pro B Natriuretic peptide (BNP) 6.1  0 - 125 pg/mL  CBC WITH DIFFERENTIAL      Component Value Range   WBC 8.3  4.0 - 10.5 K/uL   RBC 5.24  4.22 - 5.81 MIL/uL   Hemoglobin 14.1  13.0 - 17.0 g/dL   HCT 16.1  09.6 - 04.5 %   MCV 81.1  78.0 - 100.0 fL   MCH 26.9  26.0 - 34.0 pg   MCHC 33.2  30.0 - 36.0 g/dL   RDW 40.9 (*) 81.1 - 91.4 %   Platelets 265  150 - 400 K/uL   Neutrophils Relative 42 (*) 43 - 77 %   Neutro Abs 3.4  1.7 - 7.7 K/uL   Lymphocytes Relative 48 (*) 12 - 46 %   Lymphs Abs 3.9  0.7 - 4.0 K/uL   Monocytes Relative 9  3 - 12 %   Monocytes Absolute 0.7  0.1 - 1.0 K/uL   Eosinophils Relative 2  0 - 5 %   Eosinophils Absolute 0.2  0.0 - 0.7 K/uL   Basophils Relative 0  0 - 1 %   Basophils Absolute 0.0  0.0 - 0.1 K/uL  POCT I-STAT, CHEM 8      Component Value Range   Sodium 142   135 - 145 mEq/L   Potassium 4.0  3.5 - 5.1 mEq/L   Chloride 106  96 - 112 mEq/L   BUN 11  6 - 23 mg/dL   Creatinine, Ser 7.82  0.50 - 1.35 mg/dL   Glucose, Bld 956 (*) 70 - 99 mg/dL   Calcium, Ion 2.13  0.86 - 1.23 mmol/L   TCO2 25  0 - 100 mmol/L  Hemoglobin 16.0  13.0 - 17.0 g/dL   HCT 16.1  09.6 - 04.5 %  POCT I-STAT TROPONIN I      Component Value Range   Troponin i, poc 0.00  0.00 - 0.08 ng/mL   Comment 3            Dg Chest 2 View 10/22/2011  *RADIOLOGY REPORT*  Clinical Data: Shortness of breath and chest pain for 1 week. History of hypertension, stent.  History of smoking.  CHEST - 2 VIEW  Comparison: 07/25/2010  Findings: There is elevation of the right hemidiaphragm.  Heart size is normal.  Perihilar bronchitic changes are present.  There are no focal consolidations or pleural effusions. Visualized osseous structures have a normal appearance.  IMPRESSION:  1.  Bronchitic changes. 2. No evidence for acute pulmonary abnormality.  Original Report Authenticated By: Patterson Hammersmith, M.D.     0005:  Confusing historian.  High risk for ACS, with TIMI score of 2.  BMI too high for CDU CPP.  Dx testing d/w pt and family.  Questions answered.  Verb understanding, agreeable to admit. T/C to [facility]. Toniann Fail, case discussed, including:  HPI, pertinent PM/SHx, VS/PE, dx testing, ED course and treatment:  Agreeable to observation admit, requests to write temporary orders, obtain tele bed to team 10.       Laray Anger, DO 10/24/11 1237

## 2011-10-23 NOTE — Discharge Summary (Signed)
Physician Discharge Summary  Kenneth [name] NGE:[id number] DOB: Sep 29, 1960 DOA: 10/22/2011  PCP: No primary provider on file., patient has been arranged [facility] at 1030 a.m. with Dr. Drue Second.  Admit date: 10/22/2011 Discharge date: 10/23/2011  Recommendations for Outpatient Follow-up:  Follow up with your primary care physician. Follow up with [facility] [place] for cardiac catheterization. After your appointment with your primary care physician consider a referral to gastroenterology for still having persistent pain. Consider sleep study if not already done.  Discharge Diagnoses:  Principal Problem:  *Chest pain Active Problems:  Dysphagia  Hyperlipidemia   Discharge Condition: Stable  Diet recommendation: Heart healthy diet  Wt Readings from Last 3 Encounters:  10/23/11 136.986 kg (302 lb)    History of present illness:  On admission: "51 year old male with history of hyperlipidemia presented to the emergency department with complaints of chest pain on 10/23/2011."  Hospital Course:  Chest pain rule out ACS  Cycled cardiac enzymes and was ruled out for acute coronary syndrome.  Initially thought that patient may have dysphagia however patient was eating without any difficulty prior to discharge. D-dimer, normal, indicating low likely that for thromboembolic event. 2-D echocardiogram done with results as indicated below. Patient reports that he is scheduled to have a cardiac catheter at [facility] [place], [place]. Patient's last cardiac catheter in 04/30/2009 showed no flow limiting coronary artery disease, normal left ventricular ejection fraction.   Dysphagia?  Likely will make further evaluation as outpatient.  Patient was instructed to follow up with his primary care physician for referral to gastroenterology.  Patient was eating without difficulty prior to discharge.  Obesity Diet and exercise  as outpatient.  May benefit from outpatient sleep study once he establishes care with primary care physician.  Hyperlipidemia  Continue statin.  Procedures:  2D ECHO performed on 10/23/2011. Left ventricle: The cavity size was normal. There was mild focal basal hypertrophy of the septum. Systolic function was normal. The estimated ejection fraction was in the range of 55% to 60%. Although no diagnostic regional wall motion abnormality was identified, this possibility cannot be completely excluded on the basis of this study.  Consultations:  None  Discharge Exam: Filed Vitals:   10/23/11 1400  BP: 131/68  Pulse: 59  Temp: 98.8 F (37.1 C)  Resp: 18   Filed Vitals:   10/23/11 0008 10/23/11 0140 10/23/11 0500 10/23/11 1400  BP: 117/70 146/91 109/65 131/68  Pulse: 81 76 73 59  Temp: 97.8 F (36.6 C) 97.8 F (36.6 C) 98.2 F (36.8 C) 98.8 F (37.1 C)  TempSrc: Oral Oral Oral Oral  Resp: 21 18 18 18   Height:      Weight:  136.986 kg (302 lb)    SpO2: 98% 98% 96% 93%    Discharge Instructions  Discharge Orders    Future Orders Please Complete By Expires   Diet - low sodium heart healthy      Increase activity slowly      Discharge instructions      Comments:   Followup with a primary care physician with resources provided to you by case manager, consider a gastroenterology referral by her primary care physician if you're still having persistent pain.  Continue to followup with [facility] for consideration of cardiac catheterization.     Medication List  As of 10/23/2011  4:42 PM   STOP taking these medications  diclofenac 50 MG tablet         TAKE these medications         albuterol 108 (90 BASE) MCG/ACT inhaler   Commonly known as: PROVENTIL HFA;VENTOLIN HFA   Inhale 2 puffs into the lungs every 4 (four) hours as needed. For shortness of breath      aspirin 81 MG tablet   Take 81 mg by mouth every morning.      diclofenac  75 MG EC tablet   Commonly known as: VOLTAREN   Take 75 mg by mouth 2 (two) times daily.      famotidine 10 MG tablet   Commonly known as: PEPCID   Take 10 mg by mouth 2 (two) times daily.      furosemide 20 MG tablet   Commonly known as: LASIX   Take 20 mg by mouth daily.      nitroGLYCERIN 0.4 MG SL tablet   Commonly known as: NITROSTAT   Place 0.4 mg under the tongue every 5 (five) minutes as needed. For chest pain      rosuvastatin 10 MG tablet   Commonly known as: CRESTOR   Take 10 mg by mouth every morning.      traMADol 50 MG tablet   Commonly known as: ULTRAM   Take 50 mg by mouth every 6 (six) hours as needed. For pain           Patient had initially instructed case manager that he was out of his medications, but when I asked the patient multiple times he indicated that he had his medications and did not need any prescription or help.   The results of significant diagnostics from this hospitalization (including imaging, microbiology, ancillary and laboratory) are listed below for reference.    Significant Diagnostic Studies: Dg Chest 2 View  10/22/2011  *RADIOLOGY REPORT*  Clinical Data: Shortness of breath and chest pain for 1 week. History of hypertension, stent.  History of smoking.  CHEST - 2 VIEW  Comparison: 07/25/2010  Findings: There is elevation of the right hemidiaphragm.  Heart size is normal.  Perihilar bronchitic changes are present.  There are no focal consolidations or pleural effusions. Visualized osseous structures have a normal appearance.  IMPRESSION:  1.  Bronchitic changes. 2. No evidence for acute pulmonary abnormality.  Original Report Authenticated By: Patterson Hammersmith, M.D.    Microbiology: No results found for this or any previous visit (from the past 240 hour(s)).   Labs: Basic Metabolic Panel:  Lab 10/23/11 1610 10/22/11 2208  NA 139 142  K 3.5 4.0  CL 105 106  CO2 25 --  GLUCOSE 120* 102*  BUN 11 11  CREATININE 0.80 1.10    CALCIUM 8.8 --  MG -- --  PHOS -- --   Liver Function Tests:  Lab 10/23/11 0635  AST 20  ALT 25  ALKPHOS 46  BILITOT 0.2*  PROT 7.1  ALBUMIN 3.2*    Lab 10/23/11 0635  LIPASE 34  AMYLASE --   No results found for this basename: AMMONIA:5 in the last 168 hours CBC:  Lab 10/23/11 0635 10/22/11 2208 10/22/11 2200  WBC 6.4 -- 8.3  NEUTROABS -- -- 3.4  HGB 13.2 16.0 14.1  HCT 40.1 47.0 42.5  MCV 80.4 -- 81.1  PLT 249 -- 265   Cardiac Enzymes:  Lab 10/23/11 1349 10/23/11 1000 10/23/11 0215  CKTOTAL 344* 382* 418*  CKMB 2.8 3.1 2.9  CKMBINDEX -- -- --  TROPONINI <0.30 <  0.30 <0.30   BNP: BNP (last 3 results)  Basename 10/22/11 2200  PROBNP 6.1   CBG: No results found for this basename: GLUCAP:5 in the last 168 hours  Time coordinating discharge: 25 minutes  Signed:  Aquila Menzie A  [facility] 10/23/2011, 4:42 PM

## 2011-10-23 NOTE — Progress Notes (Signed)
Utilization review complete 

## 2011-12-28 ENCOUNTER — Emergency Department (HOSPITAL_COMMUNITY)
Admission: EM | Admit: 2011-12-28 | Discharge: 2011-12-28 | Disposition: A | Discharge status: Home / Self Care | Payer: Medicaid Other | Attending: Emergency Medicine | Admitting: Emergency Medicine

## 2011-12-28 ENCOUNTER — Encounter (HOSPITAL_COMMUNITY): Payer: Self-pay | Admitting: Family Medicine

## 2011-12-28 DIAGNOSIS — Z8249 Family history of ischemic heart disease and other diseases of the circulatory system: Secondary | ICD-10-CM | POA: Insufficient documentation

## 2011-12-28 DIAGNOSIS — Z7982 Long term (current) use of aspirin: Secondary | ICD-10-CM | POA: Insufficient documentation

## 2011-12-28 DIAGNOSIS — I10 Essential (primary) hypertension: Secondary | ICD-10-CM | POA: Insufficient documentation

## 2011-12-28 DIAGNOSIS — F17200 Nicotine dependence, unspecified, uncomplicated: Secondary | ICD-10-CM | POA: Insufficient documentation

## 2011-12-28 DIAGNOSIS — M25569 Pain in unspecified knee: Secondary | ICD-10-CM | POA: Insufficient documentation

## 2011-12-28 DIAGNOSIS — I2510 Atherosclerotic heart disease of native coronary artery without angina pectoris: Secondary | ICD-10-CM | POA: Insufficient documentation

## 2011-12-28 DIAGNOSIS — M25561 Pain in right knee: Secondary | ICD-10-CM

## 2011-12-28 MED ORDER — NAPROXEN 375 MG PO TABS: 375.0000 mg | ORAL_TABLET | Freq: Two times a day (BID) | ORAL | Status: DC | PRN

## 2011-12-28 MED ORDER — IBUPROFEN 400 MG PO TABS
400.0000 mg | ORAL_TABLET | Freq: Once | ORAL | Status: AC
  Administered 2011-12-28: 400 mg via ORAL
  Filled 2011-12-28: qty 1

## 2011-12-28 NOTE — ED Notes (Signed)
Patient presents to ed c/o pain in his right knee radiating up to his right hip. Denies injury. States he has been seen for same and was told he had arthirtis, was given some pain pills and fluid pills. Has been taking extra strength  Tylenol and oxycodon without relief.

## 2011-12-28 NOTE — ED Notes (Signed)
Per pt complaining of right knee pain radiating into hip. sts 4 days. Hx of same.

## 2011-12-28 NOTE — ED Provider Notes (Signed)
History  Scribed for Raeford Razor, MD, the patient was seen in room TR11C/TR11C. This chart was scribed by Candelaria Stagers. The patient's care started at 11:09 AM    CSN: [id number]  Arrival date & time 12/28/11  1026   First MD Initiated Contact with Patient 12/28/11 1101      Chief Complaint  Patient presents with  . Knee Pain  . Hip Pain    Patient is a 51 y.o. male presenting with hip pain. The history is provided by the patient. No language interpreter was used.  Hip Pain   Kenneth [name] is a 51 y.o. male who presents to the Emergency Department complaining of right knee that radiates to his right hip that started four days ago and became worse yesterday.  He states the pain is a constant aching pain that is worse with walking.  He states he has had a problem with the same knee in the past and has been told he has arthritis in this knee.  Pt is also experiencing back pain and swelling of the leg.  He denies fever or rash.  He has no h/o gout.  He has taken tylenol with some relief from sx.     Past Medical History  Diagnosis Date  . Hypercholesteremia   . Coronary artery disease   . Hypertension   . Chronic bronchitis     Past Surgical History  Procedure Date  . Finger amputation     Family History  Problem Relation Age of Onset  . Hypertension Father     History  Substance Use Topics  . Smoking status: Current Every Day Smoker -- 1.0 packs/day  . Smokeless tobacco: Not on file  . Alcohol Use: Yes     some      Review of Systems  Constitutional: Negative for fever.  Musculoskeletal: Positive for back pain and arthralgias (right knee pain, right hip pain).  Skin: Negative for rash.  All other systems reviewed and are negative.    Allergies  Review of patient's allergies indicates no known allergies.  Home Medications   Current Outpatient Rx  Name Route Sig Dispense Refill  . ALBUTEROL SULFATE HFA 108 (90 BASE) MCG/ACT IN AERS Inhalation Inhale 2  puffs into the lungs every 4 (four) hours as needed. For shortness of breath    . ASPIRIN 81 MG PO TABS Oral Take 81 mg by mouth every morning.     Marland Kitchen DICLOFENAC SODIUM 75 MG PO TBEC Oral Take 75 mg by mouth 2 (two) times daily.    Marland Kitchen FAMOTIDINE 10 MG PO TABS Oral Take 10 mg by mouth 2 (two) times daily.    . FUROSEMIDE 20 MG PO TABS Oral Take 20 mg by mouth daily.    Marland Kitchen NITROGLYCERIN 0.4 MG SL SUBL Sublingual Place 0.4 mg under the tongue every 5 (five) minutes as needed. For chest pain    . ROSUVASTATIN CALCIUM 10 MG PO TABS Oral Take 10 mg by mouth every morning.     Marland Kitchen TRAMADOL HCL 50 MG PO TABS Oral Take 50 mg by mouth every 6 (six) hours as needed. For pain      BP 117/63  Pulse 94  Temp 98.7 F (37.1 C) (Oral)  Resp 18  SpO2 95%  Physical Exam  Nursing note and vitals reviewed. Constitutional: He is oriented to person, place, and time. He appears well-developed and well-nourished. No distress.  HENT:  Head: Normocephalic and atraumatic.  Eyes: EOM are normal. Pupils  are equal, round, and reactive to light.  Neck: Neck supple. No tracheal deviation present.  Cardiovascular: Normal rate.   Pulmonary/Chest: Effort normal. No respiratory distress.  Abdominal: Soft. He exhibits no distension.  Musculoskeletal: Normal range of motion. He exhibits no edema.       Right knee grossly normal in appearance and symmetrical as compared to left.  No concerning skin changes.  No effusion.  No tenderness to palpation.  To significatn changes in ROM.  Neurovascularly intact.    Neurological: He is alert and oriented to person, place, and time.  Skin: Skin is warm and dry. He is not diaphoretic.  Psychiatric: He has a normal mood and affect. His behavior is normal.    ED Course  Procedures   DIAGNOSTIC STUDIES: Oxygen Saturation is 95% on room air, normal by my interpretation.    COORDINATION OF CARE:    Labs Reviewed - No data to display No results found.   1. Knee pain, right        MDM  51 year old male with atraumatic right knee pain. Suspect degenerative joint disease likely related to patient's morbid obesity. No indication for emergent imaging. Knee exam itself is fairly unremarkable. Doubt septic joint. Doubt DVT. No concerning skin changes for possible infectious etiology. He is neurovascularly intact distally. Plan PRN pain meds. outpt fu.  I personally preformed the services scribed in my presence. The recorded information has been reviewed and considered. Raeford Razor, MD.         Raeford Razor, MD 01/01/12 [phone]

## 2012-05-06 DIAGNOSIS — M25569 Pain in unspecified knee: Secondary | ICD-10-CM | POA: Diagnosis not present

## 2012-05-06 DIAGNOSIS — IMO0002 Reserved for concepts with insufficient information to code with codable children: Secondary | ICD-10-CM | POA: Diagnosis not present

## 2012-05-06 DIAGNOSIS — M23302 Other meniscus derangements, unspecified lateral meniscus, unspecified knee: Secondary | ICD-10-CM | POA: Diagnosis not present

## 2012-06-07 DIAGNOSIS — IMO0002 Reserved for concepts with insufficient information to code with codable children: Secondary | ICD-10-CM | POA: Diagnosis not present

## 2012-06-07 DIAGNOSIS — M1710 Unilateral primary osteoarthritis, unspecified knee: Secondary | ICD-10-CM | POA: Diagnosis not present

## 2012-07-17 DIAGNOSIS — M722 Plantar fascial fibromatosis: Secondary | ICD-10-CM | POA: Diagnosis not present

## 2012-07-18 DIAGNOSIS — M23302 Other meniscus derangements, unspecified lateral meniscus, unspecified knee: Secondary | ICD-10-CM | POA: Diagnosis not present

## 2012-07-18 DIAGNOSIS — M25569 Pain in unspecified knee: Secondary | ICD-10-CM | POA: Diagnosis not present

## 2012-07-18 DIAGNOSIS — M1710 Unilateral primary osteoarthritis, unspecified knee: Secondary | ICD-10-CM | POA: Diagnosis not present

## 2012-07-18 DIAGNOSIS — IMO0002 Reserved for concepts with insufficient information to code with codable children: Secondary | ICD-10-CM | POA: Diagnosis not present

## 2012-09-02 ENCOUNTER — Emergency Department (HOSPITAL_COMMUNITY): Payer: Medicare Other

## 2012-09-02 ENCOUNTER — Emergency Department (HOSPITAL_COMMUNITY)
Admission: EM | Admit: 2012-09-02 | Discharge: 2012-09-02 | Disposition: A | Discharge status: Home / Self Care | Payer: Medicare Other | Attending: Emergency Medicine | Admitting: Emergency Medicine

## 2012-09-02 ENCOUNTER — Encounter (HOSPITAL_COMMUNITY): Payer: Self-pay | Admitting: Emergency Medicine

## 2012-09-02 DIAGNOSIS — F17200 Nicotine dependence, unspecified, uncomplicated: Secondary | ICD-10-CM | POA: Insufficient documentation

## 2012-09-02 DIAGNOSIS — Z79899 Other long term (current) drug therapy: Secondary | ICD-10-CM | POA: Insufficient documentation

## 2012-09-02 DIAGNOSIS — J42 Unspecified chronic bronchitis: Secondary | ICD-10-CM | POA: Insufficient documentation

## 2012-09-02 DIAGNOSIS — J9819 Other pulmonary collapse: Secondary | ICD-10-CM | POA: Diagnosis not present

## 2012-09-02 DIAGNOSIS — I2510 Atherosclerotic heart disease of native coronary artery without angina pectoris: Secondary | ICD-10-CM | POA: Diagnosis not present

## 2012-09-02 DIAGNOSIS — R072 Precordial pain: Secondary | ICD-10-CM | POA: Diagnosis not present

## 2012-09-02 DIAGNOSIS — I10 Essential (primary) hypertension: Secondary | ICD-10-CM | POA: Diagnosis not present

## 2012-09-02 DIAGNOSIS — Z862 Personal history of diseases of the blood and blood-forming organs and certain disorders involving the immune mechanism: Secondary | ICD-10-CM | POA: Insufficient documentation

## 2012-09-02 DIAGNOSIS — Z8639 Personal history of other endocrine, nutritional and metabolic disease: Secondary | ICD-10-CM | POA: Insufficient documentation

## 2012-09-02 DIAGNOSIS — K2920 Alcoholic gastritis without bleeding: Secondary | ICD-10-CM

## 2012-09-02 LAB — COMPREHENSIVE METABOLIC PANEL
ALT: 20 U/L (ref 0–53)
AST: 20 U/L (ref 0–37)
Albumin: 3.3 g/dL — ABNORMAL LOW (ref 3.5–5.2)
Alkaline Phosphatase: 55 U/L (ref 39–117)
BUN: 12 mg/dL (ref 6–23)
CO2: 25 mEq/L (ref 19–32)
Calcium: 9.1 mg/dL (ref 8.4–10.5)
Chloride: 101 mEq/L (ref 96–112)
Creatinine, Ser: 0.9 mg/dL (ref 0.50–1.35)
GFR calc Af Amer: >90 mL/min (ref >90)
GFR calc non Af Amer: >90 mL/min (ref >90)
Glucose, Bld: 126 mg/dL — ABNORMAL HIGH (ref 70–99)
Potassium: 3.8 mEq/L (ref 3.5–5.1)
Sodium: 136 mEq/L (ref 135–145)
Total Bilirubin: 0.1 mg/dL — ABNORMAL LOW (ref 0.3–1.2)
Total Protein: 7.8 g/dL (ref 6.0–8.3)

## 2012-09-02 LAB — CBC
HCT: 42.9 % (ref 39.0–52.0)
Hemoglobin: 14.7 g/dL (ref 13.0–17.0)
MCH: 27.1 pg (ref 26.0–34.0)
MCHC: 34.3 g/dL (ref 30.0–36.0)
MCV: 79.2 fL (ref 78.0–100.0)
Platelets: 245 10*3/uL (ref 150–400)
RBC: 5.42 MIL/uL (ref 4.22–5.81)
RDW: 15.9 % — ABNORMAL HIGH (ref 11.5–15.5)
WBC: 6.8 10*3/uL (ref 4.0–10.5)

## 2012-09-02 LAB — RAPID URINE DRUG SCREEN, HOSP PERFORMED
Amphetamines: NOT DETECTED
Barbiturates: NOT DETECTED
Benzodiazepines: NOT DETECTED
Cocaine: POSITIVE — AB
Opiates: NOT DETECTED
Tetrahydrocannabinol: NOT DETECTED

## 2012-09-02 LAB — PRO B NATRIURETIC PEPTIDE: Pro B Natriuretic peptide (BNP): <5 pg/mL (ref 0–125)

## 2012-09-02 LAB — POCT I-STAT TROPONIN I: Troponin i, poc: 0 ng/mL (ref 0.00–0.08)

## 2012-09-02 LAB — LIPASE, BLOOD: Lipase: 41 U/L (ref 11–59)

## 2012-09-02 MED ORDER — ASPIRIN 325 MG PO TABS
325.0000 mg | ORAL_TABLET | ORAL | Status: AC
  Administered 2012-09-02: 325 mg via ORAL
  Filled 2012-09-02: qty 1

## 2012-09-02 MED ORDER — NITROGLYCERIN 0.4 MG SL SUBL: 0.4000 mg | SUBLINGUAL_TABLET | SUBLINGUAL | Status: DC | PRN

## 2012-09-02 MED ORDER — PANTOPRAZOLE SODIUM 40 MG IV SOLR
40.0000 mg | Freq: Once | INTRAVENOUS | Status: AC
  Administered 2012-09-02: 40 mg via INTRAVENOUS
  Filled 2012-09-02: qty 40

## 2012-09-02 MED ORDER — GI COCKTAIL ~~LOC~~
30.0000 mL | Freq: Once | ORAL | Status: AC
  Administered 2012-09-02: 30 mL via ORAL
  Filled 2012-09-02: qty 30

## 2012-09-02 MED ORDER — HYDROMORPHONE HCL PF 1 MG/ML IJ SOLN
1.0000 mg | Freq: Once | INTRAMUSCULAR | Status: AC
  Administered 2012-09-02: 1 mg via INTRAVENOUS
  Filled 2012-09-02: qty 1

## 2012-09-02 MED ORDER — ONDANSETRON HCL 4 MG/2ML IJ SOLN
4.0000 mg | Freq: Once | INTRAMUSCULAR | Status: AC
  Administered 2012-09-02: 4 mg via INTRAVENOUS
  Filled 2012-09-02: qty 2

## 2012-09-02 MED ORDER — PANTOPRAZOLE SODIUM 20 MG PO TBEC: 20.0000 mg | DELAYED_RELEASE_TABLET | Freq: Every day | ORAL | Status: DC

## 2012-09-02 NOTE — ED Provider Notes (Signed)
History     CSN: [id number]  Arrival date & time 09/02/12  0905   First MD Initiated Contact with Patient 09/02/12 [phone]      Chief Complaint  Patient presents with  . Chest Pain    (Consider location/radiation/quality/duration/timing/severity/associated sxs/prior treatment) HPI Patient presents today with epigastric discomfort began yesterday morning after drinking a beer at 10 AM. Patient has a history of being evaluated for chest pain and had a self-reported normal coronary catheterization last year at [facility]. That occurred after he was admitted here for a Chest pain episode and rule out by cardiac enzymes. Patient describes the pain is in the epigastric region sharp in nature with some radiation through to the back constant since 11 AM yesterday worsened with alcohol intake. He has had some nausea but no vomiting. He denies dyspnea, cough, or fever  Past Medical History  Diagnosis Date  . Hypercholesteremia   . Coronary artery disease   . Hypertension   . Chronic bronchitis     Past Surgical History  Procedure Laterality Date  . Finger amputation      Family History  Problem Relation Age of Onset  . Hypertension Father     History  Substance Use Topics  . Smoking status: Current Every Day Smoker -- 1.00 packs/day  . Smokeless tobacco: Not on file  . Alcohol Use: Yes     Comment: some      Review of Systems  All other systems reviewed and are negative.    Allergies  Review of patient's allergies indicates no known allergies.  Home Medications   Current Outpatient Rx  Name  Route  Sig  Dispense  Refill  . albuterol (PROVENTIL HFA;VENTOLIN HFA) 108 (90 BASE) MCG/ACT inhaler   Inhalation   Inhale 2 puffs into the lungs every 4 (four) hours as needed. For shortness of breath         . nitroGLYCERIN (NITROSTAT) 0.4 MG SL tablet   Sublingual   Place 0.4 mg under the tongue every 5 (five) minutes as needed. For chest pain           BP 126/55  Pulse 72   Temp(Src) 98.2 F (36.8 C) (Oral)  Resp 24  SpO2 95%  Physical Exam  Nursing note and vitals reviewed. Constitutional: He is oriented to person, place, and time. He appears well-developed and well-nourished.  Morbidly obese  HENT:  Head: Normocephalic and atraumatic.  Right Ear: External ear normal.  Left Ear: External ear normal.  Nose: Nose normal.  Mouth/Throat: Oropharynx is clear and moist.  Eyes: Conjunctivae and EOM are normal. Pupils are equal, round, and reactive to light.  Neck: Normal range of motion. Neck supple.  Cardiovascular: Normal rate, regular rhythm, normal heart sounds and intact distal pulses.   Pulmonary/Chest: Effort normal and breath sounds normal. No respiratory distress. He has no wheezes. He exhibits no tenderness.  Abdominal: Soft. Bowel sounds are normal. He exhibits no distension and no mass. There is no tenderness. There is no guarding.  Musculoskeletal: Normal range of motion.  Neurological: He is alert and oriented to person, place, and time. He has normal reflexes. He exhibits normal muscle tone. Coordination normal.  Skin: Skin is warm and dry.  Psychiatric: He has a normal mood and affect. His behavior is normal. Judgment and thought content normal.    ED Course  Procedures (including critical care time)  Labs Reviewed  CBC - Abnormal; Notable for the following:    RDW 15.9 (*)  All other components within normal limits  COMPREHENSIVE METABOLIC PANEL - Abnormal; Notable for the following:    Glucose, Bld 126 (*)    Albumin 3.3 (*)    Total Bilirubin 0.1 (*)    All other components within normal limits  PRO B NATRIURETIC PEPTIDE  LIPASE, BLOOD  URINE RAPID DRUG SCREEN (HOSP PERFORMED)  POCT I-STAT TROPONIN I   Dg Chest Port 1 View  09/02/2012   *RADIOLOGY REPORT*  Clinical Data: Chest pain  PORTABLE CHEST - 1 VIEW  Comparison: 10/22/2011  Findings: The cardiac shadow is stable.  The inspiratory effort is slightly decreased.  Right  basilar atelectasis is noted.  No focal confluent infiltrate is seen.  No bony abnormality is noted.  IMPRESSION: Right basilar atelectasis.   Original Report Authenticated By: Alcide Clever, M.D.     No diagnosis found.   Date: 09/02/2012  Rate: 82  Rhythm: normal sinus rhythm  QRS Axis: left  Intervals: normal  ST/T Wave abnormalities: nonspecific T wave changes  Conduction Disutrbances:none  Narrative Interpretation:   Old EKG Reviewed: changes noted    MDM  Patient with epigastric ttp exacerbated by etoh intake.  Symptoms improved here with gi cocktail.  Plan protonix and etoh abstinence.         Hilario Quarry, MD 09/02/12 [phone]

## 2012-09-02 NOTE — ED Notes (Signed)
Pt presents with severe substernal chest pain, pt states pain started all of the sudden at 11am yesterday morning and hasn't let up yet. Pt diaphoretic, SOB, active signs of pain in triage.

## 2012-09-03 ENCOUNTER — Ambulatory Visit: Payer: Medicare Other | Admitting: Family Medicine

## 2012-10-10 ENCOUNTER — Ambulatory Visit (INDEPENDENT_AMBULATORY_CARE_PROVIDER_SITE_OTHER): Payer: Medicare Other | Admitting: Physician Assistant

## 2012-10-10 ENCOUNTER — Encounter: Payer: Self-pay | Admitting: Physician Assistant

## 2012-10-10 VITALS — BP 142/86 | HR 74 | Temp 97.9°F | Resp 20 | Ht 67.5 in | Wt 320.0 lb

## 2012-10-10 DIAGNOSIS — R1310 Dysphagia, unspecified: Secondary | ICD-10-CM | POA: Diagnosis not present

## 2012-10-10 DIAGNOSIS — J45909 Unspecified asthma, uncomplicated: Secondary | ICD-10-CM | POA: Diagnosis not present

## 2012-10-10 DIAGNOSIS — E669 Obesity, unspecified: Secondary | ICD-10-CM

## 2012-10-10 DIAGNOSIS — M25569 Pain in unspecified knee: Secondary | ICD-10-CM | POA: Diagnosis not present

## 2012-10-10 DIAGNOSIS — J4520 Mild intermittent asthma, uncomplicated: Secondary | ICD-10-CM

## 2012-10-10 DIAGNOSIS — M25561 Pain in right knee: Secondary | ICD-10-CM

## 2012-10-10 MED ORDER — ALBUTEROL SULFATE HFA 108 (90 BASE) MCG/ACT IN AERS: 2.0000 | INHALATION_SPRAY | RESPIRATORY_TRACT | Status: DC | PRN

## 2012-10-10 MED ORDER — NABUMETONE 750 MG PO TABS: 750.0000 mg | ORAL_TABLET | Freq: Two times a day (BID) | ORAL | Status: DC

## 2012-10-10 NOTE — Progress Notes (Signed)
Patient ID: Kenneth [name] MRN: [id number], DOB: [birth year], 52 y.o. Date of Encounter: @DATE @  Chief Complaint:  Chief Complaint  Patient presents with  . new pt est care    c/o feels like food gert caught in chest when eats, bilat knee pain  . Medication Refill    has been out of all his regular meds    HPI: 52 y.o. year old AA  male  presents as a new pt today. He is kinned to [facility], pt here. Says he went ot [facility] until it closed. Needs refills on Albuterol inh to have if needed. Also needs refill on med for his knee. Also has c/o food getting stuck.  1-Asthma: Is stable. Just needs to have inhalr available if needed. 2-Knee Pain-Right: out of medicine. Having a lot of pain.  3-Food feels like it gets stuck. Ha no problem swallowing drinks. Has never seen GI or had this evaluated other than going to ER several times but says they never did any tests etc. Says he "just went there to make sure he wasn't having a heart attack or something."   Past Medical History  Diagnosis Date  . Hypercholesteremia   . Coronary artery disease   . Hypertension   . Chronic bronchitis   . Asthma   . Knee pain      Home Meds: See attached medication section for current medication list. Any medications entered into computer today will not appear on this note's list. The medications listed below were entered prior to today. Current Outpatient Prescriptions on File Prior to Visit  Medication Sig Dispense Refill  . nitroGLYCERIN (NITROSTAT) 0.4 MG SL tablet Place 0.4 mg under the tongue every 5 (five) minutes as needed. For chest pain      . pantoprazole (PROTONIX) 20 MG tablet Take 1 tablet (20 mg total) by mouth daily.  60 tablet  0   No current facility-administered medications on file prior to visit.    Allergies: No Known Allergies  History   Social History  . Marital Status: Divorced    Spouse Name: N/A    Number of Children: N/A  . Years of Education: N/A   Occupational  History  . Not on file.   Social History Main Topics  . Smoking status: Current Every Day Smoker -- 1.00 packs/day  . Smokeless tobacco: Not on file  . Alcohol Use: Yes     Comment: some  . Drug Use: No  . Sexually Active: Not on file   Other Topics Concern  . Not on file   Social History Narrative  . No narrative on file    Family History  Problem Relation Age of Onset  . Hypertension Father      Review of Systems:  See HPI for pertinent ROS. All other ROS negative.    Physical Exam: Blood pressure 142/86, pulse 74, temperature 97.9 F (36.6 C), temperature source Oral, resp. rate 20, height 5' 7.5" (1.715 m), weight 320 lb (145.151 kg)., Body mass index is 49.35 kg/(m^2). General: Severely obese [name]. Appears in no acute distress. Neck: Supple. No thyromegaly. No lymphadenopathy. Lungs: Clear bilaterally to auscultation without wheezes, rales, or rhonchi. Breathing is unlabored. Heart: RRR with S1 S2. No murmurs, rubs, or gallops. Abdomen: Soft, non-tender, non-distended with normoactive bowel sounds. No hepatomegaly. No rebound/guarding. No obvious abdominal masses. Musculoskeletal:  Strength and tone normal for 52. Extremities/Skin: Warm and dry. No edema. Neuro: Alert and oriented X 3. Moves all extremities  spontaneously. Gait is normal. CNII-XII grossly in tact. Psych:  Responds to questions appropriately with a normal affect.     ASSESSMENT AND PLAN:  52 y.o. year old male with  1. Asthma, mild intermittent, uncomplicated - albuterol (PROVENTIL HFA;VENTOLIN HFA) 108 (90 BASE) MCG/ACT inhaler; Inhale 2 puffs into the lungs every 4 (four) hours as needed. For shortness of breath  Dispense: 1 Inhaler; Refill: 2  2. Dysphagia - Ambulatory referral to Gastroenterology  3. Knee pain, right We have records form [facility]. He saw Dr. Penni Bombard 05/06/12.Then he had him f/u with Dr. Charlann Boxer 3/14. At that time, he did injection. Pt says that helped for a while but now  pain is back. At that ov Dr Charlann Boxer Rxed Relefan. I will reorder tis now. Told pt to call [facility] and sched f/u OV with them. Pt agreeable.  - nabumetone (RELAFEN) 750 MG tablet; Take 1 tablet (750 mg total) by mouth 2 (two) times daily.  Dispense: 60 tablet; Refill: 2  4. Obesity Needs weight loss. Will furhter discuss this at next OV.   Signed, [address] [place], [place], [facility] 10/10/2012 1:01 PM

## 2012-10-14 ENCOUNTER — Other Ambulatory Visit: Payer: Self-pay | Admitting: Family Medicine

## 2012-10-14 MED ORDER — FUROSEMIDE 20 MG PO TABS: 20.0000 mg | ORAL_TABLET | Freq: Every day | ORAL | Status: DC

## 2012-10-14 NOTE — Telephone Encounter (Signed)
Medication refilled per protocol. 

## 2012-10-24 ENCOUNTER — Encounter (HOSPITAL_COMMUNITY): Payer: Self-pay | Admitting: *Deleted

## 2012-10-24 ENCOUNTER — Telehealth: Payer: Self-pay | Admitting: Physician Assistant

## 2012-10-24 DIAGNOSIS — R1310 Dysphagia, unspecified: Secondary | ICD-10-CM | POA: Diagnosis not present

## 2012-10-24 NOTE — Telephone Encounter (Signed)
Called, spoke with sister Corrie Dandy.  Told need to see Ortho for this.  This was dicussed at office visit and patient was agreeable

## 2012-10-24 NOTE — Telephone Encounter (Signed)
i reviewed my LOV note. [facility] is managing his knee problem. F/U with them. I am NOT prescribing anything for this.

## 2012-11-19 ENCOUNTER — Other Ambulatory Visit: Payer: Self-pay | Admitting: Gastroenterology

## 2012-11-19 NOTE — Addendum Note (Signed)
Addended by: Willis Modena on: 11/19/2012 02:51 PM   Modules accepted: Orders

## 2012-11-20 ENCOUNTER — Encounter (HOSPITAL_COMMUNITY)
Admission: RE | Disposition: A | Discharge status: Home / Self Care | Payer: Self-pay | Source: Ambulatory Visit | Attending: Gastroenterology

## 2012-11-20 ENCOUNTER — Encounter (HOSPITAL_COMMUNITY): Payer: Self-pay | Admitting: *Deleted

## 2012-11-20 ENCOUNTER — Encounter (HOSPITAL_COMMUNITY): Payer: Self-pay | Admitting: Anesthesiology

## 2012-11-20 ENCOUNTER — Ambulatory Visit (HOSPITAL_COMMUNITY): Payer: Medicare Other | Admitting: Anesthesiology

## 2012-11-20 ENCOUNTER — Ambulatory Visit (HOSPITAL_COMMUNITY)
Admission: RE | Admit: 2012-11-20 | Discharge: 2012-11-20 | Disposition: A | Discharge status: Home / Self Care | Payer: Medicare Other | Source: Ambulatory Visit | Attending: Gastroenterology | Admitting: Gastroenterology

## 2012-11-20 DIAGNOSIS — Z5309 Procedure and treatment not carried out because of other contraindication: Secondary | ICD-10-CM | POA: Insufficient documentation

## 2012-11-20 DIAGNOSIS — R1310 Dysphagia, unspecified: Secondary | ICD-10-CM | POA: Insufficient documentation

## 2012-11-20 HISTORY — DX: Gastro-esophageal reflux disease without esophagitis: K21.9

## 2012-11-20 HISTORY — DX: Unspecified osteoarthritis, unspecified site: M19.90

## 2012-11-20 LAB — BASIC METABOLIC PANEL
BUN: 15 mg/dL (ref 6–23)
CO2: 24 mEq/L (ref 19–32)
Calcium: 9.2 mg/dL (ref 8.4–10.5)
Chloride: 103 mEq/L (ref 96–112)
Creatinine, Ser: 0.96 mg/dL (ref 0.50–1.35)
GFR calc Af Amer: >90 mL/min (ref >90)
GFR calc non Af Amer: >90 mL/min (ref >90)
Glucose, Bld: 95 mg/dL (ref 70–99)
Potassium: 3.8 mEq/L (ref 3.5–5.1)
Sodium: 139 mEq/L (ref 135–145)

## 2012-11-20 LAB — RAPID URINE DRUG SCREEN, HOSP PERFORMED
Amphetamines: NOT DETECTED
Barbiturates: NOT DETECTED
Benzodiazepines: NOT DETECTED
Cocaine: POSITIVE — AB
Opiates: NOT DETECTED
Tetrahydrocannabinol: NOT DETECTED

## 2012-11-20 LAB — ETHANOL: Alcohol, Ethyl (B): <11 mg/dL (ref 0–11)

## 2012-11-20 SURGERY — ESOPHAGOGASTRODUODENOSCOPY (EGD) WITH PROPOFOL
Anesthesia: Monitor Anesthesia Care

## 2012-11-20 MED ORDER — PROMETHAZINE HCL 25 MG/ML IJ SOLN: 6.2500 mg | INTRAMUSCULAR | Status: DC | PRN

## 2012-11-20 MED ORDER — LACTATED RINGERS IV SOLN
INTRAVENOUS | Status: AC | PRN
  Administered 2012-11-20 – 2015-11-15 (×3): via INTRAVENOUS

## 2012-11-20 MED ORDER — SODIUM CHLORIDE 0.9 % IV SOLN: INTRAVENOUS | Status: DC

## 2012-11-20 SURGICAL SUPPLY — 14 items

## 2012-11-20 NOTE — Progress Notes (Signed)
Patient positive for cocaine, case cancelled

## 2012-11-20 NOTE — Anesthesia Preprocedure Evaluation (Addendum)
Anesthesia Evaluation  Patient identified by MRN, date of birth, ID band Patient awake  General Assessment Comment:Will check drug screen prior to starting IV  Reviewed: Allergy & Precautions, H&P , NPO status , Patient's Chart, lab work & pertinent test results  Airway Mallampati: III TM Distance: <3 FB Neck ROM: Full    Dental no notable dental hx.    Pulmonary asthma , Current Smoker,  breath sounds clear to auscultation  Pulmonary exam normal       Cardiovascular hypertension, Pt. on medications Rhythm:Regular Rate:Normal     Neuro/Psych negative neurological ROS  negative psych ROS   GI/Hepatic negative GI ROS, GERD-  Medicated,(+)     substance abuse  alcohol use and cocaine use,   Endo/Other  Morbid obesity  Renal/GU negative Renal ROS  negative genitourinary   Musculoskeletal negative musculoskeletal ROS (+)   Abdominal   Peds negative pediatric ROS (+)  Hematology negative hematology ROS (+)   Anesthesia Other Findings   Reproductive/Obstetrics negative OB ROS                          Anesthesia Physical Anesthesia Plan  ASA: IV  Anesthesia Plan: MAC   Post-op Pain Management:    Induction: Intravenous  Airway Management Planned: Nasal Cannula  Additional Equipment:   Intra-op Plan:   Post-operative Plan:   Informed Consent: I have reviewed the patients History and Physical, chart, labs and discussed the procedure including the risks, benefits and alternatives for the proposed anesthesia with the patient or authorized representative who has indicated his/her understanding and acceptance.     Plan Discussed with: CRNA and Surgeon  Anesthesia Plan Comments:         Anesthesia Quick Evaluation

## 2012-11-20 NOTE — Progress Notes (Signed)
Dr. [place] Sellar will arrange for reschedule appointment.

## 2012-12-04 ENCOUNTER — Encounter (HOSPITAL_COMMUNITY): Payer: Self-pay | Admitting: Pharmacy Technician

## 2012-12-04 ENCOUNTER — Encounter (HOSPITAL_COMMUNITY): Payer: Self-pay | Admitting: *Deleted

## 2012-12-11 ENCOUNTER — Ambulatory Visit (HOSPITAL_COMMUNITY)
Admission: RE | Admit: 2012-12-11 | Discharge: 2012-12-11 | Disposition: A | Discharge status: Home / Self Care | Payer: Medicare Other | Source: Ambulatory Visit | Attending: Gastroenterology | Admitting: Gastroenterology

## 2012-12-11 ENCOUNTER — Encounter (HOSPITAL_COMMUNITY): Payer: Self-pay

## 2012-12-11 ENCOUNTER — Ambulatory Visit (HOSPITAL_COMMUNITY): Payer: Medicare Other | Admitting: Anesthesiology

## 2012-12-11 ENCOUNTER — Encounter (HOSPITAL_COMMUNITY): Payer: Self-pay | Admitting: Anesthesiology

## 2012-12-11 ENCOUNTER — Encounter (HOSPITAL_COMMUNITY)
Admission: RE | Disposition: A | Discharge status: Home / Self Care | Payer: Self-pay | Source: Ambulatory Visit | Attending: Gastroenterology

## 2012-12-11 DIAGNOSIS — R1310 Dysphagia, unspecified: Secondary | ICD-10-CM | POA: Diagnosis not present

## 2012-12-11 DIAGNOSIS — R079 Chest pain, unspecified: Secondary | ICD-10-CM | POA: Diagnosis not present

## 2012-12-11 DIAGNOSIS — K219 Gastro-esophageal reflux disease without esophagitis: Secondary | ICD-10-CM | POA: Diagnosis not present

## 2012-12-11 DIAGNOSIS — I2510 Atherosclerotic heart disease of native coronary artery without angina pectoris: Secondary | ICD-10-CM | POA: Diagnosis not present

## 2012-12-11 DIAGNOSIS — R0602 Shortness of breath: Secondary | ICD-10-CM | POA: Diagnosis not present

## 2012-12-11 HISTORY — PX: ESOPHAGOGASTRODUODENOSCOPY (EGD) WITH PROPOFOL: SHX5813

## 2012-12-11 LAB — RAPID URINE DRUG SCREEN, HOSP PERFORMED
Amphetamines: NOT DETECTED
Barbiturates: NOT DETECTED
Benzodiazepines: NOT DETECTED
Cocaine: NOT DETECTED
Opiates: NOT DETECTED
Tetrahydrocannabinol: NOT DETECTED

## 2012-12-11 SURGERY — ESOPHAGOGASTRODUODENOSCOPY (EGD) WITH PROPOFOL
Anesthesia: Monitor Anesthesia Care

## 2012-12-11 MED ORDER — KETAMINE HCL 10 MG/ML IJ SOLN
INTRAMUSCULAR | Status: DC | PRN
  Administered 2012-12-11 (×3): 10 mg via INTRAVENOUS

## 2012-12-11 MED ORDER — BUTAMBEN-TETRACAINE-BENZOCAINE 2-2-14 % EX AERO
INHALATION_SPRAY | CUTANEOUS | Status: DC | PRN
  Administered 2012-12-11: 2 via TOPICAL

## 2012-12-11 MED ORDER — MIDAZOLAM HCL 5 MG/5ML IJ SOLN
INTRAMUSCULAR | Status: DC | PRN
  Administered 2012-12-11: 2 mg via INTRAVENOUS

## 2012-12-11 MED ORDER — SODIUM CHLORIDE 0.9 % IV SOLN: INTRAVENOUS | Status: DC

## 2012-12-11 MED ORDER — LACTATED RINGERS IV SOLN
INTRAVENOUS | Status: DC
  Administered 2012-12-11: 09:00:00 via INTRAVENOUS

## 2012-12-11 MED ORDER — PROPOFOL INFUSION 10 MG/ML OPTIME
INTRAVENOUS | Status: DC | PRN
  Administered 2012-12-11: 160 ug/kg/min via INTRAVENOUS

## 2012-12-11 SURGICAL SUPPLY — 14 items

## 2012-12-11 NOTE — Op Note (Signed)
[facility] [address] [place], [address]   ENDOSCOPY PROCEDURE REPORT  PATIENT: Kenneth [name], Kenneth [name]  MR#: [id number] BIRTHDATE: 1960-10-02 , 52  yrs. old GENDER: Male ENDOSCOPIST: Willis Modena, MD REFERRED BY:  Dow Adolph, M.D. PROCEDURE DATE:  12/11/2012 PROCEDURE:  EGD, diagnostic ASA CLASS:     Class III INDICATIONS:  dysphagia, chest pain. MEDICATIONS: MAC sedation, administered by CRNA TOPICAL ANESTHETIC: Cetacaine Spray  DESCRIPTION OF PROCEDURE: After the risks benefits and alternatives of the procedure were thoroughly explained, informed consent was obtained.  The Pentax Gastroscope [id number] endoscope was introduced through the mouth and advanced to the second portion of the duodenum. Without limitations.  The instrument was slowly withdrawn as the mucosa was fully examined.     Findings:  Normal esophagus; specifically, no evidence of eosinophilic esophagitis, and no evidence of esophageal stricture or mass.  Stomach, pylorus and duodenum to the second portion normal.   Retroflexion into cardia was normal.          The scope was then withdrawn from the patient and the procedure completed.  ENDOSCOPIC IMPRESSION:     As above.  No source of dysphagia or chest pain was seen.  Suspect GERD versus dysmotility.  RECOMMENDATIONS:     1.  Watch for potential complications of procedure. 2.  Stop pantoprazole. 3.  Trial of dexlansoprazole 60 mg po qd; patient can pick up samples from our office. 4.  If symptoms persist after 2-3 weeks of dexlansoprazole, would do barium esophagram as next step in management. 5.  Follow-up with [facility] on as-needed basis, based on evolution of symptoms.  eSigned:  Willis Modena, MD 12/11/2012 9:35 AM   CC:

## 2012-12-11 NOTE — Anesthesia Preprocedure Evaluation (Addendum)
Anesthesia Evaluation  Patient identified by MRN, date of birth, ID band Patient awake    Reviewed: Allergy & Precautions, H&P , NPO status , Patient's Chart, lab work & pertinent test results  Airway Mallampati: II TM Distance: >3 FB Neck ROM: Full    Dental  (+) Dental Advisory Given and Teeth Intact   Pulmonary shortness of breath, asthma , Current Smoker,  breath sounds clear to auscultation  Pulmonary exam normal       Cardiovascular hypertension, Pt. on medications + CAD negative cardio ROS  Rhythm:Regular Rate:Normal     Neuro/Psych negative neurological ROS  negative psych ROS   GI/Hepatic Neg liver ROS, GERD-  Medicated,(+)     substance abuse (Negative urine drug screen today)  cocaine use,   Endo/Other  Morbid obesity  Renal/GU negative Renal ROS  negative genitourinary   Musculoskeletal negative musculoskeletal ROS (+)   Abdominal   Peds  Hematology negative hematology ROS (+)   Anesthesia Other Findings   Reproductive/Obstetrics                         Anesthesia Physical Anesthesia Plan  ASA: III  Anesthesia Plan: MAC   Post-op Pain Management:    Induction: Intravenous  Airway Management Planned: Nasal Cannula  Additional Equipment:   Intra-op Plan:   Post-operative Plan: Extubation in OR  Informed Consent: I have reviewed the patients History and Physical, chart, labs and discussed the procedure including the risks, benefits and alternatives for the proposed anesthesia with the patient or authorized representative who has indicated his/her understanding and acceptance.   Dental advisory given  Plan Discussed with: CRNA  Anesthesia Plan Comments:         Anesthesia Quick Evaluation

## 2012-12-11 NOTE — Transfer of Care (Signed)
Immediate Anesthesia Transfer of Care Note  Patient: Kenneth [name]  Procedure(s) Performed: Procedure(s): ESOPHAGOGASTRODUODENOSCOPY (EGD) WITH PROPOFOL (N/A) BALLOON DILATION (N/A)  Patient Location: PACU and Endoscopy Unit  Anesthesia Type:MAC  Level of Consciousness: awake and alert   Airway & Oxygen Therapy: Patient Spontanous Breathing and Patient connected to nasal cannula oxygen  Post-op Assessment: Report given to PACU RN and Post -op Vital signs reviewed and stable  Post vital signs: Reviewed and stable  Complications: No apparent anesthesia complications

## 2012-12-11 NOTE — H&P (Signed)
Patient interval history reviewed.  Patient examined again.  There has been no change from documented H/P dated 11/13/12 (scanned into chart from our office) except as documented above.  Assessment:  1.  Intermittent solid-predominant dysphagia.  Plan:  1.  Endoscopy with possible esophageal (balloon) dilatation. 2.  Risks (bleeding, infection, bowel perforation that could require surgery, sedation-related changes in cardiopulmonary systems), benefits (identification and possible treatment of source of symptoms, exclusion of certain causes of symptoms), and alternatives (watchful waiting, radiographic imaging studies, empiric medical treatment) of upper endoscopy (EGD) were explained to patient in detail and patient wishes to proceed.

## 2012-12-11 NOTE — Anesthesia Postprocedure Evaluation (Signed)
Anesthesia Post Note  Patient: Kenneth [name]  Procedure(s) Performed: Procedure(s) (LRB): ESOPHAGOGASTRODUODENOSCOPY (EGD) WITH PROPOFOL (N/A)  Anesthesia type: General  Patient location: PACU  Post pain: Pain level controlled  Post assessment: Post-op Vital signs reviewed  Last Vitals:  Filed Vitals:   12/11/12 0935  BP: 111/70  Pulse: 91  Temp: 36.6 C  Resp: 24    Post vital signs: Reviewed  Level of consciousness: sedated  Complications: No apparent anesthesia complications

## 2012-12-12 ENCOUNTER — Encounter (HOSPITAL_COMMUNITY): Payer: Self-pay | Admitting: Gastroenterology

## 2012-12-23 ENCOUNTER — Encounter: Payer: Self-pay | Admitting: Physician Assistant

## 2012-12-23 ENCOUNTER — Ambulatory Visit (INDEPENDENT_AMBULATORY_CARE_PROVIDER_SITE_OTHER): Payer: Medicare Other | Admitting: Physician Assistant

## 2012-12-23 VITALS — BP 120/72 | HR 72 | Temp 98.0°F | Resp 18 | Wt 326.0 lb

## 2012-12-23 DIAGNOSIS — L909 Atrophic disorder of skin, unspecified: Secondary | ICD-10-CM | POA: Diagnosis not present

## 2012-12-23 DIAGNOSIS — L918 Other hypertrophic disorders of the skin: Secondary | ICD-10-CM

## 2012-12-23 DIAGNOSIS — D485 Neoplasm of uncertain behavior of skin: Secondary | ICD-10-CM | POA: Diagnosis not present

## 2012-12-23 NOTE — Progress Notes (Signed)
   Patient ID: Kenneth [name] MRN: [id number], DOB: 08/16/1960, 52 y.o. Date of Encounter: 12/23/2012, 10:29 AM    Chief Complaint:  Chief Complaint  Patient presents with  . wants moles removed form eyelid and back     HPI: 52 y.o. year old obese AA  male comes in today because he wants some irritative skin lesions removed. He has 2 lesions on his right upper eyelid. They interfere with his vision because they hang down over his eye.  He also has a large lesion on his right upper back which is also irritated and gets hung up on his clothes et [employer]ie [employer].  Home Meds: See attached medication section for any medications that were entered at today's visit. The computer does not put those onto this list.The following list is a list of meds entered prior to today's visit.   Current Outpatient Prescriptions on File Prior to Visit  Medication Sig Dispense Refill  . albuterol (PROVENTIL HFA;VENTOLIN HFA) 108 (90 BASE) MCG/ACT inhaler Inhale 2 puffs into the lungs every 4 (four) hours as needed. For shortness of breath  1 Inhaler  2  . aspirin 81 MG tablet Take 81 mg by mouth daily.      . furosemide (LASIX) 20 MG tablet Take 1 tablet (20 mg total) by mouth daily.  30 tablet  5  . nitroGLYCERIN (NITROSTAT) 0.4 MG SL tablet Place 0.4 mg under the tongue every 5 (five) minutes as needed. For chest pain       Current Facility-Administered Medications on File Prior to Visit  Medication Dose Route Frequency Provider Last Rate Last Dose  . lactated ringers infusion    Continuous PRN Donn Pierini, CRNA        Allergies: No Known Allergies    Review of Systems: See HPI for pertinent ROS. All other ROS negative.    Physical Exam: Blood pressure 120/72, pulse 72, temperature 98 F (36.7 C), temperature source Oral, resp. rate 18, weight 326 lb (147.873 kg)., Body mass index is 52.00 kg/(m^2). General: Obese African American male. Appears in no acute distress. Neck: Supple. No thyromegaly. No  lymphadenopathy. Lungs: Clear bilaterally to auscultation without wheezes, rales, or rhonchi. Breathing is unlabored. Heart: Regular rhythm. No murmurs, rubs, or gallops. Msk:  Strength and tone normal for age. Skin: Right upper eyelid: Skin tags are present. Each approximately 0.5 cm size.  Right upper back: 1 skin tag: Proximally 1 cm size Neuro: Alert and oriented X 3. Moves all extremities spontaneously. Gait is normal. CNII-XII grossly in tact. Psych:  Responds to questions appropriately with a normal affect.     ASSESSMENT AND PLAN:  52 y.o. year old male with  1. Cutaneous skin tags Each site is cleansed with Betadine and alcohol. Each lesion is then excised using scissors. Each lesion and sent to pathology. Bleeding was controlled with applying pressure and applying nitroglycerin oxide stick.  - Pathology: One bottle is labeled right upper eyelid - Pathology: One bottle is labeled right upper back.  Will call patient will get the pathology report. However these appear to be benign and consistent with benign skin tag. Followup if needed.   [address] [place], [place], [facility] 12/23/2012 10:29 AM

## 2012-12-25 LAB — PATHOLOGY

## 2012-12-30 ENCOUNTER — Encounter: Payer: Self-pay | Admitting: Family Medicine

## 2013-01-01 ENCOUNTER — Encounter: Payer: Self-pay | Admitting: Family Medicine

## 2013-01-13 ENCOUNTER — Ambulatory Visit: Payer: Medicare Other | Admitting: Physician Assistant

## 2013-01-15 ENCOUNTER — Encounter: Payer: Self-pay | Admitting: Family Medicine

## 2013-01-16 ENCOUNTER — Encounter: Payer: Self-pay | Admitting: *Deleted

## 2013-01-16 LAB — EGD

## 2013-01-22 ENCOUNTER — Ambulatory Visit: Payer: Medicare Other | Admitting: Physician Assistant

## 2013-03-31 ENCOUNTER — Ambulatory Visit (INDEPENDENT_AMBULATORY_CARE_PROVIDER_SITE_OTHER): Payer: Medicare Other | Admitting: Physician Assistant

## 2013-03-31 ENCOUNTER — Encounter: Payer: Self-pay | Admitting: Physician Assistant

## 2013-03-31 VITALS — BP 126/60 | HR 92 | Temp 98.2°F | Resp 20 | Wt 338.0 lb

## 2013-03-31 DIAGNOSIS — E669 Obesity, unspecified: Secondary | ICD-10-CM

## 2013-03-31 DIAGNOSIS — T148XXA Other injury of unspecified body region, initial encounter: Secondary | ICD-10-CM

## 2013-03-31 MED ORDER — FUROSEMIDE 20 MG PO TABS: 20.0000 mg | ORAL_TABLET | Freq: Every day | ORAL | Status: DC

## 2013-04-01 NOTE — Progress Notes (Signed)
    Patient ID: Kenneth [name] MRN: [id number], DOB: [birth year], 53 y.o. Date of Encounter: 04/01/2013, 11:48 AM    Chief Complaint:  Chief Complaint  Patient presents with  . c/o swelling in back of thighs     HPI: 53 y.o. year old obese AA  male reports that he is having a tight sensation in the back of both thighs.  Because they feel tight, he was calling this "swelling"--see chief complaint above. He reports he has had no, or injury. No recent falls. No new or increased activity or exercise.     Home Meds: See attached medication section for any medications that were entered at today's visit. The computer does not put those onto this list.The following list is a list of meds entered prior to today's visit.   Current Outpatient Prescriptions on File Prior to Visit  Medication Sig Dispense Refill  . albuterol (PROVENTIL HFA;VENTOLIN HFA) 108 (90 BASE) MCG/ACT inhaler Inhale 2 puffs into the lungs every 4 (four) hours as needed. For shortness of breath  1 Inhaler  2  . aspirin 81 MG tablet Take 81 mg by mouth daily.      Marland Kitchen dexlansoprazole (DEXILANT) 60 MG capsule Take 60 mg by mouth daily.      . nabumetone (RELAFEN) 750 MG tablet Take 1 tablet by mouth daily.      . nitroGLYCERIN (NITROSTAT) 0.4 MG SL tablet Place 0.4 mg under the tongue every 5 (five) minutes as needed. For chest pain       Current Facility-Administered Medications on File Prior to Visit  Medication Dose Route Frequency Provider Last Rate Last Dose  . lactated ringers infusion    Continuous PRN Chyrel Masson, CRNA        Allergies: No Known Allergies    Review of Systems: See HPI for pertinent ROS. All other ROS negative.    Physical Exam: Blood pressure 126/60, pulse 92, temperature 98.2 F (36.8 C), temperature source Oral, resp. rate 20, weight 338 lb (153.316 kg)., Body mass index is 54.58 kg/(m^2). General:  Morbidly obese African American male . Appears in no acute distress.  Neck: Supple. No  thyromegaly. No lymphadenopathy. Lungs: Clear bilaterally to auscultation without wheezes, rales, or rhonchi. Breathing is unlabored. Heart: Regular rhythm. No murmurs, rubs, or gallops. Msk:  Strength and tone normal for age. Extremities/Skin: I inspected his legs bilaterally. There is no pitting edema of the legs at all--even down at the ankle level. Inspection of the posterior thighs is normal. There is no erythema no edema no ecchymosis no other lesions. As well I palpated the area with no significant pain with palpation. Neuro: Alert and oriented X 3. Moves all extremities spontaneously. Gait is normal. CNII-XII grossly in tact. Psych:  Responds to questions appropriately with a normal affect.     ASSESSMENT AND PLAN:  53 y.o. year old male with  1. Obesity/Deconditioning  2. Muscle strain I reassured patient that there is no swelling present to be causing his sensation of "tightness" . His symptoms are most likely secondary to tight muscles. Recommended some stretches that he can do. Followup if symptoms worsen or change.   Signed, [address] [place], [place], [facility] 04/01/2013 11:48 AM

## 2013-04-04 ENCOUNTER — Telehealth: Payer: Self-pay | Admitting: Physician Assistant

## 2013-04-04 MED ORDER — DEXLANSOPRAZOLE 60 MG PO CPDR: 60.0000 mg | DELAYED_RELEASE_CAPSULE | Freq: Every day | ORAL | Status: DC

## 2013-04-04 NOTE — Telephone Encounter (Signed)
Pt is needing Dexilant 60 mg Pharmacy is [facility] Call back number is [phone]

## 2013-04-04 NOTE — Telephone Encounter (Signed)
Medication refilled per protocol. 

## 2013-04-09 ENCOUNTER — Telehealth: Payer: Self-pay | Admitting: Family Medicine

## 2013-04-09 DIAGNOSIS — K219 Gastro-esophageal reflux disease without esophagitis: Secondary | ICD-10-CM

## 2013-04-09 DIAGNOSIS — R1314 Dysphagia, pharyngoesophageal phase: Secondary | ICD-10-CM

## 2013-04-09 MED ORDER — LANSOPRAZOLE 30 MG PO CPDR: 30.0000 mg | DELAYED_RELEASE_CAPSULE | Freq: Every day | ORAL | Status: DC

## 2013-04-09 NOTE — Telephone Encounter (Signed)
Out of PPI and have not heard from insurance about Dexilant.  Per provider can order Lansoprazole. Pt aware

## 2013-05-29 DIAGNOSIS — R633 Feeding difficulties, unspecified: Secondary | ICD-10-CM | POA: Diagnosis not present

## 2013-06-12 ENCOUNTER — Ambulatory Visit: Payer: Medicare Other | Admitting: Family Medicine

## 2013-06-17 ENCOUNTER — Encounter: Payer: Self-pay | Admitting: Family Medicine

## 2013-06-17 ENCOUNTER — Ambulatory Visit (INDEPENDENT_AMBULATORY_CARE_PROVIDER_SITE_OTHER): Payer: Medicare Other | Admitting: Family Medicine

## 2013-06-17 VITALS — BP 120/74 | HR 96 | Temp 98.0°F | Resp 22 | Ht 66.0 in | Wt 324.0 lb

## 2013-06-17 DIAGNOSIS — M25569 Pain in unspecified knee: Secondary | ICD-10-CM

## 2013-06-17 DIAGNOSIS — M25561 Pain in right knee: Secondary | ICD-10-CM

## 2013-06-17 DIAGNOSIS — J019 Acute sinusitis, unspecified: Secondary | ICD-10-CM | POA: Diagnosis not present

## 2013-06-17 MED ORDER — AMOXICILLIN 875 MG PO TABS: 875.0000 mg | ORAL_TABLET | Freq: Two times a day (BID) | ORAL | Status: DC

## 2013-06-17 MED ORDER — DICLOFENAC SODIUM 75 MG PO TBEC: 75.0000 mg | DELAYED_RELEASE_TABLET | Freq: Two times a day (BID) | ORAL | Status: DC

## 2013-06-17 NOTE — Progress Notes (Signed)
Subjective:    Patient ID: Kenneth [name], male    DOB: [birth year], 53 y.o.   MRN: [id number]  HPI Patient presents with 2 weeks of anterior right knee pain. The pain is located under his patella. He aggravated it well climbing a ladder. He also has a mild effusion in the right knee. An x-ray of the right knee obtained at 2012 also patellofemoral arthritis. He denies any locking or catching of the knee. There is no laxity in the knee joint. [name] also having the epistaxis, rhinorrhea, sinus pressure and pain. He had subjective fevers. Past Medical History  Diagnosis Date  . Hypercholesteremia   . Coronary artery disease   . Hypertension   . Chronic bronchitis   . Asthma   . Knee pain   . GERD (gastroesophageal reflux disease)   . Arthritis   . Shortness of breath    Current Outpatient Prescriptions on File Prior to Visit  Medication Sig Dispense Refill  . albuterol (PROVENTIL HFA;VENTOLIN HFA) 108 (90 BASE) MCG/ACT inhaler Inhale 2 puffs into the lungs every 4 (four) hours as needed. For shortness of breath  1 Inhaler  2  . aspirin 81 MG tablet Take 81 mg by mouth daily.      . furosemide (LASIX) 20 MG tablet Take 1 tablet (20 mg total) by mouth daily.  30 tablet  5  . lansoprazole (PREVACID) 30 MG capsule Take 1 capsule (30 mg total) by mouth daily at 12 noon.  30 capsule  5  . nitroGLYCERIN (NITROSTAT) 0.4 MG SL tablet Place 0.4 mg under the tongue every 5 (five) minutes as needed. For chest pain       Current Facility-Administered Medications on File Prior to Visit  Medication Dose Route Frequency Provider Last Rate Last Dose  . lactated ringers infusion    Continuous PRN Chyrel Masson, CRNA       No Known Allergies History   Social History  . Marital Status: Divorced    Spouse Name: N/A    Number of Children: N/A  . Years of Education: N/A   Occupational History  . Not on file.   Social History Main Topics  . Smoking status: Current Every Day Smoker -- 1.00  packs/day  . Smokeless tobacco: Not on file  . Alcohol Use: Yes     Comment: some  . Drug Use: Yes    Special: Cocaine     Comment: patient states none- read lab report 09/02/2012/ 11-20-12 case cx.due to positive screen test-pt. advised no recreational drugs to be used  . Sexual Activity: Not on file   Other Topics Concern  . Not on file   Social History Narrative  . No narrative on file      Review of Systems  All other systems reviewed and are negative.       Objective:   Physical Exam  Vitals reviewed. HENT:  Head: Normocephalic and atraumatic.  Right Ear: Tympanic membrane and ear canal normal.  Left Ear: Tympanic membrane and ear canal normal.  Nose: Mucosal edema and rhinorrhea present. Right sinus exhibits maxillary sinus tenderness. Left sinus exhibits maxillary sinus tenderness.  Cardiovascular: Normal rate, regular rhythm and normal heart sounds.  Exam reveals no gallop.   No murmur heard. Pulmonary/Chest: Effort normal and breath sounds normal. No respiratory distress. He has no wheezes. He has no rales.  Musculoskeletal:       Right knee: He exhibits decreased range of motion, swelling and effusion. He exhibits  no LCL laxity, normal meniscus and no MCL laxity. Tenderness found. Patellar tendon tenderness noted.          Assessment & Plan:  1. 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 I recommended diclofenac 75 mg by mouth twice a day. If the pain is no better in one to 2 weeks I recommend returning for a Cortizone injection. - diclofenac (VOLTAREN) 75 MG EC tablet; Take 1 tablet (75 mg total) by mouth 2 (two) times daily.  Dispense: 60 tablet; Refill: 0  2. Acute rhinosinusitis Begin amoxicillin 875 mg by mouth twice a day for 10 days. - amoxicillin (AMOXIL) 875 MG tablet; Take 1 tablet (875 mg total) by mouth 2 (two) times daily.  Dispense: 20 tablet; Refill: 0

## 2013-07-03 ENCOUNTER — Ambulatory Visit (INDEPENDENT_AMBULATORY_CARE_PROVIDER_SITE_OTHER): Payer: Medicare Other | Admitting: Family Medicine

## 2013-07-03 ENCOUNTER — Encounter: Payer: Self-pay | Admitting: Family Medicine

## 2013-07-03 VITALS — BP 100/62 | HR 96 | Temp 98.1°F | Resp 24 | Ht 66.0 in | Wt 320.0 lb

## 2013-07-03 DIAGNOSIS — M25569 Pain in unspecified knee: Secondary | ICD-10-CM

## 2013-07-03 DIAGNOSIS — M25561 Pain in right knee: Secondary | ICD-10-CM

## 2013-07-03 NOTE — Progress Notes (Signed)
Subjective:    Patient ID: Kenneth [name], male    DOB: October 28, 1960, 53 y.o.   MRN: [id number]  HPI 06/17/13 Patient presents with 2 weeks of anterior right knee pain. The pain is located under his patella. He aggravated it well climbing a ladder. He also has a mild effusion in the right knee. An x-ray of the right knee obtained at 2012 also patellofemoral arthritis. He denies any locking or catching of the knee. There is no laxity in the knee joint. [name] also having the epistaxis, rhinorrhea, sinus pressure and pain. He had subjective fevers.  At that time, my plan was: 1. 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 I recommended diclofenac 75 mg by mouth twice a day. If the pain is no better in one to 2 weeks I recommend returning for a Cortizone injection. - diclofenac (VOLTAREN) 75 MG EC tablet; Take 1 tablet (75 mg total) by mouth 2 (two) times daily.  Dispense: 60 tablet; Refill: 0  07/03/13 The patient's knee pain is no better. Now hurts in the front and back of the knee. It hurts worse with prolonged standing or going up and down steps. It hurts to walk. He denies any erythema or effusion. Past Medical History  Diagnosis Date  . Hypercholesteremia   . Coronary artery disease   . Hypertension   . Chronic bronchitis   . Asthma   . Knee pain   . GERD (gastroesophageal reflux disease)   . Arthritis   . Shortness of breath    Current Outpatient Prescriptions on File Prior to Visit  Medication Sig Dispense Refill  . albuterol (PROVENTIL HFA;VENTOLIN HFA) 108 (90 BASE) MCG/ACT inhaler Inhale 2 puffs into the lungs every 4 (four) hours as needed. For shortness of breath  1 Inhaler  2  . amoxicillin (AMOXIL) 875 MG tablet Take 1 tablet (875 mg total) by mouth 2 (two) times daily.  20 tablet  0  . aspirin 81 MG tablet Take 81 mg by mouth daily.      . diclofenac (VOLTAREN) 75 MG EC tablet Take 1 tablet (75 mg total) by mouth 2 (two) times daily.  60 tablet  0  .  furosemide (LASIX) 20 MG tablet Take 1 tablet (20 mg total) by mouth daily.  30 tablet  5  . lansoprazole (PREVACID) 30 MG capsule Take 1 capsule (30 mg total) by mouth daily at 12 noon.  30 capsule  5  . nitroGLYCERIN (NITROSTAT) 0.4 MG SL tablet Place 0.4 mg under the tongue every 5 (five) minutes as needed. For chest pain       Current Facility-Administered Medications on File Prior to Visit  Medication Dose Route Frequency Provider Last Rate Last Dose  . lactated ringers infusion    Continuous PRN Chyrel Masson, CRNA       No Known Allergies History   Social History  . Marital Status: Divorced    Spouse Name: N/A    Number of Children: N/A  . Years of Education: N/A   Occupational History  . Not on file.   Social History Main Topics  . Smoking status: Current Every Day Smoker -- 1.00 packs/day  . Smokeless tobacco: Not on file  . Alcohol Use: Yes     Comment: some  . Drug Use: Yes    Special: Cocaine     Comment: patient states none- read lab report 09/02/2012/ 11-20-12 case cx.due to positive screen test-pt. advised no recreational drugs  to be used  . Sexual Activity: Not on file   Other Topics Concern  . Not on file   Social History Narrative  . No narrative on file      Review of Systems  All other systems reviewed and are negative.      Objective:   Physical Exam  Vitals reviewed. HENT:  Head: Normocephalic and atraumatic.  Right Ear: Tympanic membrane and ear canal normal.  Left Ear: Tympanic membrane and ear canal normal.  Nose: Mucosal edema and rhinorrhea present. Right sinus exhibits maxillary sinus tenderness. Left sinus exhibits maxillary sinus tenderness.  Cardiovascular: Normal rate, regular rhythm and normal heart sounds.  Exam reveals no gallop.   No murmur heard. Pulmonary/Chest: Effort normal and breath sounds normal. No respiratory distress. He has no wheezes. He has no rales.  Musculoskeletal:       Right knee: He exhibits decreased  range of motion, swelling and effusion. He exhibits no LCL laxity, normal meniscus and no MCL laxity. Tenderness found. Patellar tendon tenderness noted.   Patient has a positive Apley grind. He is tender to palpation over the posterior medial and posterior lateral aspects of his knee joint.       Assessment & Plan:  1. Right knee pain I suspect a meniscal tear versus osteoarthritis. After obtaining informed consent, using sterile technique, I injected his right knee with a mixture of 2 cc of 0.1% lidocaine without epinephrine, 2 cc of Marcaine, and 2 cc of 40 mg per mL Kenalog. The patient tolerated the procedure well without complication.

## 2013-07-10 ENCOUNTER — Encounter: Payer: Self-pay | Admitting: Family Medicine

## 2013-07-10 ENCOUNTER — Emergency Department (HOSPITAL_COMMUNITY)
Admission: EM | Admit: 2013-07-10 | Discharge: 2013-07-10 | Disposition: A | Discharge status: Home / Self Care | Payer: Medicare Other | Source: Home / Self Care

## 2013-07-10 ENCOUNTER — Ambulatory Visit (INDEPENDENT_AMBULATORY_CARE_PROVIDER_SITE_OTHER): Payer: Medicare Other | Admitting: Family Medicine

## 2013-07-10 VITALS — BP 118/74 | HR 110 | Temp 97.4°F | Resp 28 | Ht 66.0 in | Wt 320.0 lb

## 2013-07-10 DIAGNOSIS — R059 Cough, unspecified: Secondary | ICD-10-CM

## 2013-07-10 DIAGNOSIS — J029 Acute pharyngitis, unspecified: Secondary | ICD-10-CM

## 2013-07-10 DIAGNOSIS — R05 Cough: Secondary | ICD-10-CM

## 2013-07-10 LAB — RAPID STREP SCREEN (MED CTR MEBANE ONLY): Streptococcus, Group A Screen (Direct): POSITIVE — AB

## 2013-07-10 MED ORDER — AMOXICILLIN 875 MG PO TABS: 875.0000 mg | ORAL_TABLET | Freq: Two times a day (BID) | ORAL | Status: DC

## 2013-07-10 MED ORDER — HYDROCODONE-HOMATROPINE 5-1.5 MG/5ML PO SYRP: 5.0000 mL | ORAL_SOLUTION | Freq: Three times a day (TID) | ORAL | Status: DC | PRN

## 2013-07-10 NOTE — Progress Notes (Signed)
Subjective:    Patient ID: Kenneth [name], male    DOB: 31-Jan-1961, 53 y.o.   MRN: [id number]  HPI Patient had a dry nonproductive cough for 3 days. He denies any fevers or chills. He denies any hemoptysis. He denies any shortness of breath. He is not wheezing. He does have a severe sore throat. He denies any rhinorrhea. There is no rash. There is no tender lymphadenopathy in the neck. He denies any otalgia. He denies any sinus pain. Past Medical History  Diagnosis Date  . Hypercholesteremia   . Coronary artery disease   . Hypertension   . Chronic bronchitis   . Asthma   . Knee pain   . GERD (gastroesophageal reflux disease)   . Arthritis   . Shortness of breath    Current Outpatient Prescriptions on File Prior to Visit  Medication Sig Dispense Refill  . albuterol (PROVENTIL HFA;VENTOLIN HFA) 108 (90 BASE) MCG/ACT inhaler Inhale 2 puffs into the lungs every 4 (four) hours as needed. For shortness of breath  1 Inhaler  2  . aspirin 81 MG tablet Take 81 mg by mouth daily.      . diclofenac (VOLTAREN) 75 MG EC tablet Take 1 tablet (75 mg total) by mouth 2 (two) times daily.  60 tablet  0  . furosemide (LASIX) 20 MG tablet Take 1 tablet (20 mg total) by mouth daily.  30 tablet  5  . lansoprazole (PREVACID) 30 MG capsule Take 1 capsule (30 mg total) by mouth daily at 12 noon.  30 capsule  5  . nitroGLYCERIN (NITROSTAT) 0.4 MG SL tablet Place 0.4 mg under the tongue every 5 (five) minutes as needed. For chest pain       Current Facility-Administered Medications on File Prior to Visit  Medication Dose Route Frequency Provider Last Rate Last Dose  . lactated ringers infusion    Continuous PRN Chyrel Masson, CRNA       No Known Allergies History   Social History  . Marital Status: Divorced    Spouse Name: N/A    Number of Children: N/A  . Years of Education: N/A   Occupational History  . Not on file.   Social History Main Topics  . Smoking status: Current Every Day Smoker --  1.00 packs/day  . Smokeless tobacco: Not on file  . Alcohol Use: Yes     Comment: some  . Drug Use: Yes    Special: Cocaine     Comment: patient states none- read lab report 09/02/2012/ 11-20-12 case cx.due to positive screen test-pt. advised no recreational drugs to be used  . Sexual Activity: Not on file   Other Topics Concern  . Not on file   Social History Narrative  . No narrative on file      Review of Systems  All other systems reviewed and are negative.      Objective:   Physical Exam  Vitals reviewed. Constitutional: He appears well-developed and well-nourished.  HENT:  Right Ear: External ear normal. Tympanic membrane is not erythematous.  Left Ear: External ear normal. Tympanic membrane is erythematous.  Nose: Rhinorrhea present.  Mouth/Throat: Oropharynx is clear and moist. No oropharyngeal exudate.  Eyes: Conjunctivae are normal.  Neck: Neck supple.  Cardiovascular: Normal rate, regular rhythm and normal heart sounds.   Pulmonary/Chest: Effort normal and breath sounds normal. No respiratory distress. He has no wheezes. He has no rales.  Lymphadenopathy:    He has no cervical adenopathy.  Assessment & Plan:  1. Sore throat - Rapid strep screen  2. Cough Strep test returned positive. Also the patient on amoxicillin 875 mg by mouth twice a day for 10 days. He can also use Cepacol lozenges as needed for sore throat. He can also take Hycodan 1 teaspoon every 8 hours as needed for cough. - HYDROcodone-homatropine (HYCODAN) 5-1.5 MG/5ML syrup; Take 5 mLs by mouth every 8 (eight) hours as needed for cough.  Dispense: 120 mL; Refill: 0

## 2013-08-27 DIAGNOSIS — H1030 Unspecified acute conjunctivitis, unspecified eye: Secondary | ICD-10-CM | POA: Diagnosis not present

## 2013-09-10 DIAGNOSIS — H01009 Unspecified blepharitis unspecified eye, unspecified eyelid: Secondary | ICD-10-CM | POA: Diagnosis not present

## 2013-10-09 DIAGNOSIS — H1010 Acute atopic conjunctivitis, unspecified eye: Secondary | ICD-10-CM | POA: Diagnosis not present

## 2013-10-23 DIAGNOSIS — H1010 Acute atopic conjunctivitis, unspecified eye: Secondary | ICD-10-CM | POA: Diagnosis not present

## 2013-10-30 ENCOUNTER — Encounter: Payer: Self-pay | Admitting: Family Medicine

## 2013-10-30 ENCOUNTER — Ambulatory Visit (INDEPENDENT_AMBULATORY_CARE_PROVIDER_SITE_OTHER): Payer: Medicare Other | Admitting: Family Medicine

## 2013-10-30 VITALS — BP 110/68 | HR 92 | Temp 98.2°F | Resp 22 | Ht 66.0 in | Wt 327.0 lb

## 2013-10-30 DIAGNOSIS — M25569 Pain in unspecified knee: Secondary | ICD-10-CM | POA: Diagnosis not present

## 2013-10-30 DIAGNOSIS — M25561 Pain in right knee: Secondary | ICD-10-CM

## 2013-10-30 NOTE — Progress Notes (Signed)
Subjective:    Patient ID: Kenneth [name], male    DOB: 05/13/1960, 53 y.o.   MRN: [id number]  HPI  Patient presents with 1 week of right knee pain.  I injected his right knee in April for pain I felt was due to osteoarthritis.  Patient states the pain completely subsided and only returned 1 week ago but is now severe. Past Medical History  Diagnosis Date  . Hypercholesteremia   . Coronary artery disease   . Hypertension   . Chronic bronchitis   . Asthma   . Knee pain   . GERD (gastroesophageal reflux disease)   . Arthritis   . Shortness of breath    Current Outpatient Prescriptions on File Prior to Visit  Medication Sig Dispense Refill  . albuterol (PROVENTIL HFA;VENTOLIN HFA) 108 (90 BASE) MCG/ACT inhaler Inhale 2 puffs into the lungs every 4 (four) hours as needed. For shortness of breath  1 Inhaler  2  . aspirin 81 MG tablet Take 81 mg by mouth daily.      . diclofenac (VOLTAREN) 75 MG EC tablet Take 1 tablet (75 mg total) by mouth 2 (two) times daily.  60 tablet  0  . docusate sodium (COLACE) 100 MG capsule Take 100 mg by mouth 2 (two) times daily.      . furosemide (LASIX) 20 MG tablet Take 1 tablet (20 mg total) by mouth daily.  30 tablet  5  . HYDROcodone-homatropine (HYCODAN) 5-1.5 MG/5ML syrup Take 5 mLs by mouth every 8 (eight) hours as needed for cough.  120 mL  0  . hydrocortisone (PROCTOSOL HC) 2.5 % rectal cream Place 1 application rectally 2 (two) times daily.      . lansoprazole (PREVACID) 30 MG capsule Take 1 capsule (30 mg total) by mouth daily at 12 noon.  30 capsule  5  . nitroGLYCERIN (NITROSTAT) 0.4 MG SL tablet Place 0.4 mg under the tongue every 5 (five) minutes as needed. For chest pain       Current Facility-Administered Medications on File Prior to Visit  Medication Dose Route Frequency Provider Last Rate Last Dose  . lactated ringers infusion    Continuous PRN Chyrel Masson, CRNA       No Known Allergies History   Social History  . Marital  Status: Divorced    Spouse Name: N/A    Number of Children: N/A  . Years of Education: N/A   Occupational History  . Not on file.   Social History Main Topics  . Smoking status: Current Every Day Smoker -- 1.00 packs/day  . Smokeless tobacco: Not on file  . Alcohol Use: Yes     Comment: some  . Drug Use: Yes    Special: Cocaine     Comment: patient states none- read lab report 09/02/2012/ 11-20-12 case cx.due to positive screen test-pt. advised no recreational drugs to be used  . Sexual Activity: Not on file   Other Topics Concern  . Not on file   Social History Narrative  . No narrative on file     Review of Systems  All other systems reviewed and are negative.      Objective:   Physical Exam  Vitals reviewed. Constitutional: He appears well-developed and well-nourished.  Musculoskeletal:       Right knee: He exhibits decreased range of motion and effusion. He exhibits no deformity, no erythema, no LCL laxity, normal patellar mobility, no bony tenderness, normal meniscus and no MCL laxity. Tenderness  found. Medial joint line and lateral joint line tenderness noted. No MCL and no LCL tenderness noted.          Assessment & Plan:  1. Right knee pain I suspect osteoarthritis.  Using sterile technique I injected the right knee with 2 cc of marcaine, 2 cc of 0.1% lidocaine and 2 cc of 40 mg/ml kenalog.  Patient tolerated well without complications.  Get X-ray if no better in 1 week.

## 2013-11-01 ENCOUNTER — Other Ambulatory Visit: Payer: Self-pay | Admitting: Family Medicine

## 2013-11-03 NOTE — Telephone Encounter (Signed)
Refill appropriate and filled per protocol. 

## 2014-03-12 ENCOUNTER — Telehealth: Payer: Self-pay | Admitting: Physician Assistant

## 2014-03-12 ENCOUNTER — Other Ambulatory Visit: Payer: Self-pay | Admitting: Physician Assistant

## 2014-03-12 NOTE — Telephone Encounter (Signed)
Medication refilled per protocol. 

## 2014-04-09 ENCOUNTER — Encounter (HOSPITAL_COMMUNITY): Payer: Self-pay | Admitting: Gastroenterology

## 2014-04-27 ENCOUNTER — Encounter (HOSPITAL_COMMUNITY): Payer: Self-pay | Admitting: Adult Health

## 2014-04-27 ENCOUNTER — Emergency Department (HOSPITAL_COMMUNITY)
Admission: EM | Admit: 2014-04-27 | Discharge: 2014-04-27 | Disposition: A | Discharge status: Home / Self Care | Payer: Medicare Other | Attending: Emergency Medicine | Admitting: Emergency Medicine

## 2014-04-27 ENCOUNTER — Emergency Department (HOSPITAL_COMMUNITY): Payer: Medicare Other

## 2014-04-27 DIAGNOSIS — Z8639 Personal history of other endocrine, nutritional and metabolic disease: Secondary | ICD-10-CM | POA: Diagnosis not present

## 2014-04-27 DIAGNOSIS — S99912A Unspecified injury of left ankle, initial encounter: Secondary | ICD-10-CM | POA: Diagnosis not present

## 2014-04-27 DIAGNOSIS — Z7952 Long term (current) use of systemic steroids: Secondary | ICD-10-CM | POA: Insufficient documentation

## 2014-04-27 DIAGNOSIS — K219 Gastro-esophageal reflux disease without esophagitis: Secondary | ICD-10-CM | POA: Diagnosis not present

## 2014-04-27 DIAGNOSIS — Z79899 Other long term (current) drug therapy: Secondary | ICD-10-CM | POA: Insufficient documentation

## 2014-04-27 DIAGNOSIS — W1839XA Other fall on same level, initial encounter: Secondary | ICD-10-CM | POA: Diagnosis not present

## 2014-04-27 DIAGNOSIS — R52 Pain, unspecified: Secondary | ICD-10-CM

## 2014-04-27 DIAGNOSIS — J45901 Unspecified asthma with (acute) exacerbation: Secondary | ICD-10-CM | POA: Diagnosis not present

## 2014-04-27 DIAGNOSIS — I10 Essential (primary) hypertension: Secondary | ICD-10-CM | POA: Insufficient documentation

## 2014-04-27 DIAGNOSIS — R0902 Hypoxemia: Secondary | ICD-10-CM | POA: Diagnosis not present

## 2014-04-27 DIAGNOSIS — I2510 Atherosclerotic heart disease of native coronary artery without angina pectoris: Secondary | ICD-10-CM | POA: Diagnosis not present

## 2014-04-27 DIAGNOSIS — M1990 Unspecified osteoarthritis, unspecified site: Secondary | ICD-10-CM | POA: Diagnosis not present

## 2014-04-27 DIAGNOSIS — J9811 Atelectasis: Secondary | ICD-10-CM | POA: Diagnosis not present

## 2014-04-27 DIAGNOSIS — Z720 Tobacco use: Secondary | ICD-10-CM | POA: Diagnosis not present

## 2014-04-27 DIAGNOSIS — Y9389 Activity, other specified: Secondary | ICD-10-CM | POA: Diagnosis not present

## 2014-04-27 DIAGNOSIS — Y9289 Other specified places as the place of occurrence of the external cause: Secondary | ICD-10-CM | POA: Diagnosis not present

## 2014-04-27 DIAGNOSIS — M25572 Pain in left ankle and joints of left foot: Secondary | ICD-10-CM | POA: Diagnosis not present

## 2014-04-27 DIAGNOSIS — Y998 Other external cause status: Secondary | ICD-10-CM | POA: Insufficient documentation

## 2014-04-27 DIAGNOSIS — S93402A Sprain of unspecified ligament of left ankle, initial encounter: Secondary | ICD-10-CM | POA: Diagnosis not present

## 2014-04-27 DIAGNOSIS — R55 Syncope and collapse: Secondary | ICD-10-CM | POA: Insufficient documentation

## 2014-04-27 DIAGNOSIS — M7989 Other specified soft tissue disorders: Secondary | ICD-10-CM | POA: Diagnosis not present

## 2014-04-27 LAB — CBG MONITORING, ED: Glucose-Capillary: 147 mg/dL — ABNORMAL HIGH (ref 70–99)

## 2014-04-27 LAB — I-STAT CHEM 8, ED
BUN: 15 mg/dL (ref 6–23)
Calcium, Ion: 1.13 mmol/L (ref 1.12–1.23)
Chloride: 103 mmol/L (ref 96–112)
Creatinine, Ser: 1 mg/dL (ref 0.50–1.35)
Glucose, Bld: 190 mg/dL — ABNORMAL HIGH (ref 70–99)
HCT: 51 % (ref 39.0–52.0)
Hemoglobin: 17.3 g/dL — ABNORMAL HIGH (ref 13.0–17.0)
Potassium: 3.9 mmol/L (ref 3.5–5.1)
Sodium: 141 mmol/L (ref 135–145)
TCO2: 23 mmol/L (ref 0–100)

## 2014-04-27 LAB — CBC
HCT: 44.3 % (ref 39.0–52.0)
Hemoglobin: 14.5 g/dL (ref 13.0–17.0)
MCH: 26.1 pg (ref 26.0–34.0)
MCHC: 32.7 g/dL (ref 30.0–36.0)
MCV: 79.8 fL (ref 78.0–100.0)
Platelets: 266 10*3/uL (ref 150–400)
RBC: 5.55 MIL/uL (ref 4.22–5.81)
RDW: 15.1 % (ref 11.5–15.5)
WBC: 7.9 10*3/uL (ref 4.0–10.5)

## 2014-04-27 LAB — BASIC METABOLIC PANEL
Anion gap: 9 (ref 5–15)
BUN: 12 mg/dL (ref 6–23)
CO2: 24 mmol/L (ref 19–32)
Calcium: 8.9 mg/dL (ref 8.4–10.5)
Chloride: 106 mmol/L (ref 96–112)
Creatinine, Ser: 1.05 mg/dL (ref 0.50–1.35)
GFR calc Af Amer: >90 mL/min (ref >90)
GFR calc non Af Amer: 79 mL/min — ABNORMAL LOW (ref >90)
Glucose, Bld: 187 mg/dL — ABNORMAL HIGH (ref 70–99)
Potassium: 4 mmol/L (ref 3.5–5.1)
Sodium: 139 mmol/L (ref 135–145)

## 2014-04-27 LAB — BRAIN NATRIURETIC PEPTIDE: B Natriuretic Peptide: 15.5 pg/mL (ref 0.0–100.0)

## 2014-04-27 LAB — D-DIMER, QUANTITATIVE: D-Dimer, Quant: 0.62 ug/mL-FEU — ABNORMAL HIGH (ref 0.00–0.48)

## 2014-04-27 LAB — I-STAT TROPONIN, ED: Troponin i, poc: 0 ng/mL (ref 0.00–0.08)

## 2014-04-27 MED ORDER — HYDROCODONE-ACETAMINOPHEN 5-325 MG PO TABS: 1.0000 | ORAL_TABLET | Freq: Four times a day (QID) | ORAL | Status: DC | PRN

## 2014-04-27 MED ORDER — IOHEXOL 350 MG/ML SOLN
100.0000 mL | Freq: Once | INTRAVENOUS | Status: AC | PRN
  Administered 2014-04-27: 100 mL via INTRAVENOUS

## 2014-04-27 MED ORDER — OXYCODONE-ACETAMINOPHEN 5-325 MG PO TABS
2.0000 | ORAL_TABLET | Freq: Once | ORAL | Status: AC
  Administered 2014-04-27: 2 via ORAL
  Filled 2014-04-27: qty 2

## 2014-04-27 MED ORDER — IPRATROPIUM BROMIDE 0.02 % IN SOLN
0.5000 mg | Freq: Once | RESPIRATORY_TRACT | Status: AC
  Administered 2014-04-27: 0.5 mg via RESPIRATORY_TRACT
  Filled 2014-04-27: qty 2.5

## 2014-04-27 MED ORDER — ALBUTEROL SULFATE (2.5 MG/3ML) 0.083% IN NEBU
5.0000 mg | INHALATION_SOLUTION | Freq: Once | RESPIRATORY_TRACT | Status: AC
  Administered 2014-04-27: 5 mg via RESPIRATORY_TRACT
  Filled 2014-04-27: qty 6

## 2014-04-27 NOTE — ED Provider Notes (Signed)
CSN: [id number]     Arrival date & time 04/27/14  1648 History   First MD Initiated Contact with Patient 04/27/14 1848     Chief Complaint  Patient presents with  . Loss of Consciousness     (Consider location/radiation/quality/duration/timing/severity/associated sxs/prior Treatment) HPI Comments: Patient with past medical history of hyperlipidemia, CAD, hypertension, PE, and asthma presents emergency department with chief complaint of syncopal episode. Patient states that he was exiting his vehicle today, when he suddenly passed out. He states that he had stood up, and the next thing he remembers is getting off of the ground. He states that he has had some shortness of breath for the past several weeks. He relates it to his asthma, and has been taking his inhaler with some relief. He denies any cough, fever, or chills. He denies any pain in his chest or abdomen. Denies any swelling of his lower extremities. Patient also complains of left ankle pain, which she thinks that he injured when he fell during the syncopal episode. He denies any other complaints. He reports taking a baby aspirin daily.  The history is provided by the patient. No language interpreter was used.    Past Medical History  Diagnosis Date  . Hypercholesteremia   . Coronary artery disease   . Hypertension   . Chronic bronchitis   . Asthma   . Knee pain   . GERD (gastroesophageal reflux disease)   . Arthritis   . Shortness of breath    Past Surgical History  Procedure Laterality Date  . Finger amputation    . Esophagogastroduodenoscopy (egd) with propofol N/A 12/11/2012    Procedure: ESOPHAGOGASTRODUODENOSCOPY (EGD) WITH PROPOFOL;  Surgeon: Arta Silence, MD;  Location: [facility];  Service: Endoscopy;  Laterality: N/A;   Family History  Problem Relation Age of Onset  . Hypertension Father    History  Substance Use Topics  . Smoking status: Current Every Day Smoker -- 1.00 packs/day  . Smokeless tobacco: Not  on file  . Alcohol Use: Yes     Comment: some    Review of Systems  Constitutional: Negative for fever and chills.  Respiratory: Positive for shortness of breath.   Cardiovascular: Negative for chest pain.  Gastrointestinal: Negative for nausea, vomiting, diarrhea and constipation.  Genitourinary: Negative for dysuria.  Neurological: Positive for syncope.  All other systems reviewed and are negative.     Allergies  Review of patient's allergies indicates no known allergies.  Home Medications   Prior to Admission medications   Medication Sig Start Date End Date Taking? Authorizing Provider  aspirin 81 MG tablet Take 81 mg by mouth daily.    Historical Provider, MD  diclofenac (VOLTAREN) 75 MG EC tablet TAKE 1 TABLET BY MOUTH 2 TIMES DAILY.    [name], MD  docusate sodium (COLACE) 100 MG capsule Take 100 mg by mouth 2 (two) times daily.    Historical Provider, MD  furosemide (LASIX) 20 MG tablet Take 1 tablet (20 mg total) by mouth daily. 03/31/13   Lonie Peak Dixon, PA-C  HYDROcodone-homatropine [facility]) 5-1.5 MG/5ML syrup Take 5 mLs by mouth every 8 (eight) hours as needed for cough. 07/10/13   [name], MD  hydrocortisone (PROCTOSOL HC) 2.5 % rectal cream Place 1 application rectally 2 (two) times daily.    Historical Provider, MD  lansoprazole (PREVACID) 30 MG capsule Take 1 capsule (30 mg total) by mouth daily at 12 noon. 04/09/13   Orlena Sheldon, PA-C  nitroGLYCERIN (NITROSTAT)  0.4 MG SL tablet Place 0.4 mg under the tongue every 5 (five) minutes as needed. For chest pain    Historical Provider, MD  PROAIR HFA 108 (90 BASE) MCG/ACT inhaler INHALE 2 PUFFS INTO THE LUNGS EVERY 4 HOURS AS NEEDED FOR SHORTNESS OF BREATH 03/12/14   Lonie Peak Dixon, PA-C   BP 121/71 mmHg  Pulse 87  Temp(Src) 98.4 F (36.9 C) (Oral)  Resp 21  SpO2 93% Physical Exam  Constitutional: He is oriented to person, place, and time. He appears well-developed and well-nourished.  HENT:  Head:  Normocephalic and atraumatic.  Eyes: Conjunctivae and EOM are normal. Pupils are equal, round, and reactive to light. Right eye exhibits no discharge. Left eye exhibits no discharge. No scleral icterus.  Neck: Normal range of motion. Neck supple. No JVD present.  Cardiovascular: Normal rate, regular rhythm and normal heart sounds.  Exam reveals no gallop and no friction rub.   No murmur heard. Pulmonary/Chest: Effort normal and breath sounds normal. No respiratory distress. He has no wheezes. He has no rales. He exhibits no tenderness.  Some increased work of breathing, but no wheezes, speaks in full sentences  Abdominal: Soft. He exhibits no distension and no mass. There is no tenderness. There is no rebound and no guarding.  Musculoskeletal: Normal range of motion. He exhibits no edema or tenderness.  Left ankle tender to palpation over the ATFL, no bony abnormality or deformity  Neurological: He is alert and oriented to person, place, and time.  Skin: Skin is warm and dry.  Psychiatric: He has a normal mood and affect. His behavior is normal. Judgment and thought content normal.  Nursing note and vitals reviewed.   ED Course  Procedures (including critical care time) Results for orders placed or performed during the hospital encounter of 04/27/14  CBC  (at [facility])  Result Value Ref Range   WBC 7.9 4.0 - 10.5 K/uL   RBC 5.55 4.22 - 5.81 MIL/uL   Hemoglobin 14.5 13.0 - 17.0 g/dL   HCT 44.3 39.0 - 52.0 %   MCV 79.8 78.0 - 100.0 fL   MCH 26.1 26.0 - 34.0 pg   MCHC 32.7 30.0 - 36.0 g/dL   RDW 15.1 11.5 - 15.5 %   Platelets 266 150 - 400 K/uL  Basic metabolic panel  (at [facility])  Result Value Ref Range   Sodium 139 135 - 145 mmol/L   Potassium 4.0 3.5 - 5.1 mmol/L   Chloride 106 96 - 112 mmol/L   CO2 24 19 - 32 mmol/L   Glucose, Bld 187 (H) 70 - 99 mg/dL   BUN 12 6 - 23 mg/dL   Creatinine, Ser 1.05 0.50 - 1.35 mg/dL   Calcium 8.9 8.4 - 10.5 mg/dL   GFR  calc non Af Amer 79 (L) >90 mL/min   GFR calc Af Amer >90 >90 mL/min   Anion gap 9 5 - 15  Brain natriuretic peptide  Result Value Ref Range   B Natriuretic Peptide 15.5 0.0 - 100.0 pg/mL  D-dimer, quantitative  Result Value Ref Range   D-Dimer, Quant 0.62 (H) 0.00 - 0.48 ug/mL-FEU  I-Stat Chem 8, ED  Result Value Ref Range   Sodium 141 135 - 145 mmol/L   Potassium 3.9 3.5 - 5.1 mmol/L   Chloride 103 96 - 112 mmol/L   BUN 15 6 - 23 mg/dL   Creatinine, Ser 1.00 0.50 - 1.35 mg/dL   Glucose, Bld 190 (  H) 70 - 99 mg/dL   Calcium, Ion 1.13 1.12 - 1.23 mmol/L   TCO2 23 0 - 100 mmol/L   Hemoglobin 17.3 (H) 13.0 - 17.0 g/dL   HCT 51.0 39.0 - 52.0 %  CBG, ED  Result Value Ref Range   Glucose-Capillary 147 (H) 70 - 99 mg/dL   Comment 1 Documented in Chart    Comment 2 Notify RN   I-Stat Troponin, ED (not at [facility])  Result Value Ref Range   Troponin i, poc 0.00 0.00 - 0.08 ng/mL   Comment 3           Dg Ankle Complete Left  04/27/2014   CLINICAL DATA:  Passed out and rolled left ankle. Now having medial ankle pain and swelling  EXAM: LEFT ANKLE COMPLETE - 3+ VIEW  COMPARISON:  None.  FINDINGS: There is no evidence of fracture, dislocation, or joint effusion. There is no evidence of arthropathy or other focal bone abnormality. Diffuse soft tissue swelling noted. There is a small plantar heel spur.  IMPRESSION: 1. No acute bone abnormality identified. 2. Soft tissue swelling.   Electronically Signed   By: Kerby Moors M.D.   On: 04/27/2014 18:24   Ct Angio Chest Pe W/cm &/or Wo Cm  04/27/2014   CLINICAL DATA:  Hypoxia, syncope.  EXAM: CT ANGIOGRAPHY CHEST WITH CONTRAST  TECHNIQUE: Multidetector CT imaging of the chest was performed using the standard protocol during bolus administration of intravenous contrast. Multiplanar CT image reconstructions and MIPs were obtained to evaluate the vascular anatomy.  CONTRAST:  153mL OMNIPAQUE IOHEXOL 350 MG/ML SOLN  COMPARISON:  None.  FINDINGS: No filling  defects in the pulmonary arteries to suggest pulmonary emboli. Heart is normal size. Aorta is normal caliber. No mediastinal, hilar, or axillary adenopathy.  Areas of bibasilar atelectasis, right greater than left. No confluent opacities. No effusions. Low lung volumes.  Bilateral gynecomastia, left greater than right. Imaging into the upper abdomen shows no acute findings. Suspect mild fatty infiltration of the liver.  No acute bony abnormality or focal bone lesion.  Review of the MIP images confirms the above findings.  IMPRESSION: Low lung volumes with bibasilar atelectasis, right greater than left.  No evidence of pulmonary embolus.   Electronically Signed   By: Rolm Baptise M.D.   On: 04/27/2014 22:59     Imaging Review Dg Ankle Complete Left  04/27/2014   CLINICAL DATA:  Passed out and rolled left ankle. Now having medial ankle pain and swelling  EXAM: LEFT ANKLE COMPLETE - 3+ VIEW  COMPARISON:  None.  FINDINGS: There is no evidence of fracture, dislocation, or joint effusion. There is no evidence of arthropathy or other focal bone abnormality. Diffuse soft tissue swelling noted. There is a small plantar heel spur.  IMPRESSION: 1. No acute bone abnormality identified. 2. Soft tissue swelling.   Electronically Signed   By: Kerby Moors M.D.   On: 04/27/2014 18:24     EKG Interpretation   Date/Time:  Monday April 27 2014 16:56:21 EST Ventricular Rate:  100 PR Interval:  160 QRS Duration: 108 QT Interval:  376 QTC Calculation: 485 R Axis:   -60 Text Interpretation:  Normal sinus rhythm Left anterior fascicular block  Left ventricular hypertrophy Prolonged QT Abnormal ECG No significant  change since last tracing Confirmed by DOCHERTY  MD, Chelsea ([id number]) on  04/27/2014 11:22:40 PM      MDM   Final diagnoses:  Hypoxia  Syncope, unspecified syncope type  Ankle sprain,  left, initial encounter    Patient with syncopal episode today. He has a history of CAD, and also says that he has  had a PE in the past. He does complain of some shortness of breath, but denies any chest pain. Will be important to ensure that this is not cardiac syncope and not a PE. We'll check basic labs, d-dimer, and BNP given shortness of breath. Will splint ankle with ASO, and recommend orthopedic follow-up for ankle sprain.  D-dimer is elevated. Given patient's risk factors for PE and prior history, will check CT angio.  If negative, patient can likely be safely discharged to home with PCP follow-up.  11:26 PM CT is negative.  Patient's O2 sat holding steady at >90% on RA.  Will treat for ankle sprain.  Doubt cardiac syncope.  No chest pain.  Baseline SOB, which patient attributes to his asthma, but could be pickwickian as well.  Seen by and discussed with Dr. Tawnya Crook, who agrees with plan for discharge to home.  Patient is stable and ready for discharge.  Montine Circle, PA-C 04/27/14 [id number]  Ernestina Patches, MD 04/28/14 [phone]

## 2014-04-27 NOTE — ED Notes (Signed)
Notified CT patient has an IV for CT test.

## 2014-04-27 NOTE — Discharge Instructions (Signed)
Ankle Sprain °An ankle sprain is an injury to the strong, fibrous tissues (ligaments) that hold the bones of your ankle joint together.  °CAUSES °An ankle sprain is usually caused by a fall or by twisting your ankle. Ankle sprains most commonly occur when you step on the outer edge of your foot, and your ankle turns inward. People who participate in sports are more prone to these types of injuries.  °SYMPTOMS  °· Pain in your ankle. The pain may be present at rest or only when you are trying to stand or walk. °· Swelling. °· Bruising. Bruising may develop immediately or within 1 to 2 days after your injury. °· Difficulty standing or walking, particularly when turning corners or changing directions. °DIAGNOSIS  °Your caregiver will ask you details about your injury and perform a physical exam of your ankle to determine if you have an ankle sprain. During the physical exam, your caregiver will press on and apply pressure to specific areas of your foot and ankle. Your caregiver will try to move your ankle in certain ways. An X-ray exam may be done to be sure a bone was not broken or a ligament did not separate from one of the bones in your ankle (avulsion fracture).  °TREATMENT  °Certain types of braces can help stabilize your ankle. Your caregiver can make a recommendation for this. Your caregiver may recommend the use of medicine for pain. If your sprain is severe, your caregiver may refer you to a surgeon who helps to restore function to parts of your skeletal system (orthopedist) or a physical therapist. °HOME CARE INSTRUCTIONS  °· Apply ice to your injury for 1-2 days or as directed by your caregiver. Applying ice helps to reduce inflammation and pain. °¨ Put ice in a plastic bag. °¨ Place a towel between your skin and the bag. °¨ Leave the ice on for 15-20 minutes at a time, every 2 hours while you are awake. °· Only take over-the-counter or prescription medicines for pain, discomfort, or fever as directed by  your caregiver. °· Elevate your injured ankle above the level of your heart as much as possible for 2-3 days. °· If your caregiver recommends crutches, use them as instructed. Gradually put weight on the affected ankle. Continue to use crutches or a cane until you can walk without feeling pain in your ankle. °· If you have a plaster splint, wear the splint as directed by your caregiver. Do not rest it on anything harder than a pillow for the first 24 hours. Do not put weight on it. Do not get it wet. You may take it off to take a shower or bath. °· You may have been given an elastic bandage to wear around your ankle to provide support. If the elastic bandage is too tight (you have numbness or tingling in your foot or your foot becomes cold and blue), adjust the bandage to make it comfortable. °· If you have an air splint, you may blow more air into it or let air out to make it more comfortable. You may take your splint off at night and before taking a shower or bath. Wiggle your toes in the splint several times per day to decrease swelling. °SEEK MEDICAL CARE IF:  °· You have rapidly increasing bruising or swelling. °· Your toes feel extremely cold or you lose feeling in your foot. °· Your pain is not relieved with medicine. °SEEK IMMEDIATE MEDICAL CARE IF: °· Your toes are numb or blue. °·   You have severe pain that is increasing. MAKE SURE YOU:   Understand these instructions.  Will watch your condition.  Will get help right away if you are not doing well or get worse. Document Released: 03/13/2005 Document Revised: 12/06/2011 Document Reviewed: 03/25/2011 [facility] Patient Information 2015 [place], [place]. This information is not intended to replace advice given to you by your health care provider. Make sure you discuss any questions you have with your health care provider. Syncope Syncope is a medical term for fainting or passing out. This means you lose consciousness and drop to the ground. People are  generally unconscious for less than 5 minutes. You may have some muscle twitches for up to 15 seconds before waking up and returning to normal. Syncope occurs more often in older adults, but it can happen to anyone. While most causes of syncope are not dangerous, syncope can be a sign of a serious medical problem. It is important to seek medical care.  CAUSES  Syncope is caused by a sudden drop in blood flow to the brain. The specific cause is often not determined. Factors that can bring on syncope include:  Taking medicines that lower blood pressure.  Sudden changes in posture, such as standing up quickly.  Taking more medicine than prescribed.  Standing in one place for too long.  Seizure disorders.  Dehydration and excessive exposure to heat.  Low blood sugar (hypoglycemia).  Straining to have a bowel movement.  Heart disease, irregular heartbeat, or other circulatory problems.  Fear, emotional distress, seeing blood, or severe pain. SYMPTOMS  Right before fainting, you may:  Feel dizzy or light-headed.  Feel nauseous.  See all white or all black in your field of vision.  Have cold, clammy skin. DIAGNOSIS  Your health care provider will ask about your symptoms, perform a physical exam, and perform an electrocardiogram (ECG) to record the electrical activity of your heart. Your health care provider may also perform other heart or blood tests to determine the cause of your syncope which may include:  Transthoracic echocardiogram (TTE). During echocardiography, sound waves are used to evaluate how blood flows through your heart.  Transesophageal echocardiogram (TEE).  Cardiac monitoring. This allows your health care provider to monitor your heart rate and rhythm in real time.  Holter monitor. This is a portable device that records your heartbeat and can help diagnose heart arrhythmias. It allows your health care provider to track your heart activity for several days, if  needed.  Stress tests by exercise or by giving medicine that makes the heart beat faster. TREATMENT  In most cases, no treatment is needed. Depending on the cause of your syncope, your health care provider may recommend changing or stopping some of your medicines. HOME CARE INSTRUCTIONS  Have someone stay with you until you feel stable.  Do not drive, use machinery, or play sports until your health care provider says it is okay.  Keep all follow-up appointments as directed by your health care provider.  Lie down right away if you start feeling like you might faint. Breathe deeply and steadily. Wait until all the symptoms have passed.  Drink enough fluids to keep your urine clear or pale yellow.  If you are taking blood pressure or heart medicine, get up slowly and take several minutes to sit and then stand. This can reduce dizziness. SEEK IMMEDIATE MEDICAL CARE IF:   You have a severe headache.  You have unusual pain in the chest, abdomen, or back.  You are  bleeding from your mouth or rectum, or you have black or tarry stool.  You have an irregular or very fast heartbeat.  You have pain with breathing.  You have repeated fainting or seizure-like jerking during an episode.  You faint when sitting or lying down.  You have confusion.  You have trouble walking.  You have severe weakness.  You have vision problems. If you fainted, call your local emergency services (911 in [place] Do not drive yourself to the hospital.  MAKE SURE YOU:  Understand these instructions.  Will watch your condition.  Will get help right away if you are not doing well or get worse. Document Released: 03/13/2005 Document Revised: 03/18/2013 Document Reviewed: 05/12/2011 [facility] Patient Information 2015 [place], [place]. This information is not intended to replace advice given to you by your health care provider. Make sure you discuss any questions you have with your health care provider.

## 2014-04-27 NOTE — ED Notes (Signed)
presents with injury to left side of head and left ankle  Post syncopal episode that occurred this afternoon after standing up to get out car. He does not remember anything, states he woke up and his head and ankle hurt him. abraisions to left side of head. Left ankle swelling.  Alert and oriented at this time.

## 2014-04-27 NOTE — ED Notes (Signed)
Pt made aware to return if symptoms worsen or if any life threatening symptoms occur.   

## 2014-05-01 ENCOUNTER — Ambulatory Visit (INDEPENDENT_AMBULATORY_CARE_PROVIDER_SITE_OTHER): Payer: Medicare Other | Admitting: Family Medicine

## 2014-05-01 ENCOUNTER — Encounter: Payer: Self-pay | Admitting: Family Medicine

## 2014-05-01 VITALS — BP 130/70 | HR 100 | Temp 98.0°F | Resp 24 | Ht 68.0 in | Wt 329.0 lb

## 2014-05-01 DIAGNOSIS — R55 Syncope and collapse: Secondary | ICD-10-CM | POA: Diagnosis not present

## 2014-05-01 MED ORDER — HYDROCODONE-ACETAMINOPHEN 5-325 MG PO TABS: 1.0000 | ORAL_TABLET | Freq: Four times a day (QID) | ORAL | Status: DC | PRN

## 2014-05-01 NOTE — Progress Notes (Signed)
Subjective:    Patient ID: Kenneth [name], male    DOB: 1961-03-24, 54 y.o.   MRN: [id number]  HPI Patient is here for hospital follow-up. Patient had a syncopal episode earlier this week. He was driving in his car. He felt fine. He opened the door to go into a store and instantly passed out. There was no warning. There was no presyncopal aura or symptoms. Patient states that he was unconscious for less than a minute and then resolves spontaneously. He denies any seizure activity or bowel or bladder incontinence. He did not bite his tongue. He denies any palpitations or chest pain. He does have chronic shortness of breath. Past medical history as significant for coronary artery disease although the patient states that his last catheterization revealed no significant blockages. He also has a history of smoking and tobacco abuse as well as chronic bronchitis with obesity hypoventilation syndrome. He denies any recent tachyarrhythmias or presyncopal symptoms. He has no history of seizure disorder. I reviewed the CT scan from the hospital which were negative for pulmonary emboli. Also revealed EKG that was performed in the hospital which revealed normal sinus rhythm. There was a long QT interval at approximately 480.  However there is no obvious ischemia or infarction. Otherwise the patient feels fine. He did suffer a contusion to his head and sprained his ankle when he passed out. Past Medical History  Diagnosis Date  . Hypercholesteremia   . Coronary artery disease   . Hypertension   . Chronic bronchitis   . Asthma   . Knee pain   . GERD (gastroesophageal reflux disease)   . Arthritis   . Shortness of breath    Past Surgical History  Procedure Laterality Date  . Finger amputation    . Esophagogastroduodenoscopy (egd) with propofol N/A 12/11/2012    Procedure: ESOPHAGOGASTRODUODENOSCOPY (EGD) WITH PROPOFOL;  Surgeon: Arta Silence, MD;  Location: [facility];  Service: Endoscopy;  Laterality:  N/A;   Current Outpatient Prescriptions on File Prior to Visit  Medication Sig Dispense Refill  . aspirin 81 MG tablet Take 81 mg by mouth daily.    . diclofenac (VOLTAREN) 75 MG EC tablet TAKE 1 TABLET BY MOUTH 2 TIMES DAILY. (Patient not taking: Reported on 05/01/2014) 60 tablet 3  . docusate sodium (COLACE) 100 MG capsule Take 100 mg by mouth 2 (two) times daily.    . furosemide (LASIX) 20 MG tablet Take 1 tablet (20 mg total) by mouth daily. (Patient not taking: Reported on 05/01/2014) 30 tablet 5  . HYDROcodone-acetaminophen (NORCO/VICODIN) 5-325 MG per tablet Take 1 tablet by mouth every 6 (six) hours as needed for moderate pain or severe pain. (Patient not taking: Reported on 05/01/2014) 6 tablet 0  . hydrocortisone (PROCTOSOL HC) 2.5 % rectal cream Place 1 application rectally 2 (two) times daily.    . lansoprazole (PREVACID) 30 MG capsule Take 1 capsule (30 mg total) by mouth daily at 12 noon. (Patient not taking: Reported on 05/01/2014) 30 capsule 5  . nitroGLYCERIN (NITROSTAT) 0.4 MG SL tablet Place 0.4 mg under the tongue every 5 (five) minutes as needed. For chest pain    . PROAIR HFA 108 (90 BASE) MCG/ACT inhaler INHALE 2 PUFFS INTO THE LUNGS EVERY 4 HOURS AS NEEDED FOR SHORTNESS OF BREATH (Patient not taking: Reported on 05/01/2014) 8.5 g 2   Current Facility-Administered Medications on File Prior to Visit  Medication Dose Route Frequency Provider Last Rate Last Dose  . lactated ringers infusion  Continuous PRN Chyrel Masson, CRNA       No Known Allergies History   Social History  . Marital Status: Divorced    Spouse Name: N/A    Number of Children: N/A  . Years of Education: N/A   Occupational History  . Not on file.   Social History Main Topics  . Smoking status: Current Every Day Smoker -- 1.00 packs/day  . Smokeless tobacco: Not on file  . Alcohol Use: Yes     Comment: some  . Drug Use: Yes    Special: Cocaine     Comment: patient states none- read lab report  09/02/2012/ 11-20-12 case cx.due to positive screen test-pt. advised no recreational drugs to be used  . Sexual Activity: Not on file   Other Topics Concern  . Not on file   Social History Narrative        Review of Systems  All other systems reviewed and are negative.      Objective:   Physical Exam  Constitutional: He is oriented to person, place, and time. He appears well-developed and well-nourished.  Cardiovascular: Normal rate, regular rhythm and normal heart sounds.   Pulmonary/Chest: He has wheezes.  Abdominal: Soft. Bowel sounds are normal. He exhibits no distension. There is no tenderness. There is no rebound.  Musculoskeletal: He exhibits no edema.  Neurological: He is alert and oriented to person, place, and time. He has normal reflexes. He displays normal reflexes. No cranial nerve deficit. He exhibits normal muscle tone. Coordination normal.          Assessment & Plan:  Syncope and collapse - Plan: Ambulatory referral to Cardiology  Patient's syncope is due to cerebral hypoperfusion. Differential diagnosis includes orthostatic hypotension upon standing, cardiac arrhythmia, congestive heart failure. Physical exam shows no evidence of valvular disorder. I believe the patient would benefit from a referral to cardiology for a Holter monitor, an echocardiogram to rule out depressed ejection fraction, and I believe the patient also would benefit from a sleep study because I'm almost certain the patient has obstructive sleep apnea. All these factors contribute to the risk of syncope due to cardiac arrhythmias. I will arrange for referral to a cardiologist as soon as possible

## 2014-05-03 ENCOUNTER — Encounter: Payer: Self-pay | Admitting: Family Medicine

## 2014-05-13 ENCOUNTER — Encounter: Payer: Self-pay | Admitting: Cardiology

## 2014-05-13 ENCOUNTER — Ambulatory Visit (INDEPENDENT_AMBULATORY_CARE_PROVIDER_SITE_OTHER): Payer: Medicare Other | Admitting: Cardiology

## 2014-05-13 VITALS — BP 130/70 | HR 80 | Ht 64.0 in | Wt 328.0 lb

## 2014-05-13 DIAGNOSIS — Z87898 Personal history of other specified conditions: Secondary | ICD-10-CM | POA: Insufficient documentation

## 2014-05-13 DIAGNOSIS — R55 Syncope and collapse: Secondary | ICD-10-CM | POA: Insufficient documentation

## 2014-05-13 NOTE — Progress Notes (Signed)
Cardiology Office Note   Date:  05/13/2014   ID:  Kenneth [name], DOB 22-Apr-1960, MRN [id number]  PCP:  Karis Juba, PA-C  Cardiologist:   Minus Breeding, MD   No chief complaint on file.     History of Present Illness: Kenneth [name] is a 54 y.o. male who presents for for evaluation of a syncopal episode. I reviewed emergency room records. He was in the emergency room on the fourth of this month for this. He was getting out of his car at [facility].  While getting out the consciousness and went down injuring his ankle. He doesn't remember any of the events. He had no prodrome. He does have a lot of coughing and may have been coughing at that time and recalls having some difficulty getting his breathing right before he passed out but doesn't know absolutely whether he was coughing. He did not lose bowel or bladder. When he came to he knew where he was. He did not have any palpitations. He has not had this happen before or since. He did go to the emergency room. I reviewed these records. He had some LVH repolarization changes and a somewhat indeterminate axis. QTC on the fourth was 480. D-dimer was mildly elevated but his CT was negative for pulmonary embolism. Troponins were negative and a BNP was normal. He was seen his primary care office and his QTC reviewed was normal that day. He otherwise has had no cardiac complaints recently. He has some mild orthostatic symptoms occasionally. He denies any chest pressure, neck or arm discomfort. [name] not had any palpitations. He does have some chronic dyspnea but no PND or orthopnea. Of note I reviewed an echocardiogram in 2013. He was hospitalized with chest pain that had an essentially normal echocardiogram. 2005 was able to find a catheterization with normal coronaries. He does report some vague history of "being unblocked" but I don't have any documentation of this.  I do note that he is scheduled for a sleep study.    Past Medical History  Diagnosis  Date  . Hypercholesteremia   . Hypertension   . Chronic bronchitis   . Asthma   . GERD (gastroesophageal reflux disease)   . Arthritis     Past Surgical History  Procedure Laterality Date  . Finger amputation    . Esophagogastroduodenoscopy (egd) with propofol N/A 12/11/2012    Procedure: ESOPHAGOGASTRODUODENOSCOPY (EGD) WITH PROPOFOL;  Surgeon: Arta Silence, MD;  Location: [facility];  Service: Endoscopy;  Laterality: N/A;     Current Outpatient Prescriptions  Medication Sig Dispense Refill  . aspirin 81 MG tablet Take 81 mg by mouth daily.    . diclofenac (VOLTAREN) 75 MG EC tablet TAKE 1 TABLET BY MOUTH 2 TIMES DAILY. 60 tablet 3  . docusate sodium (COLACE) 100 MG capsule Take 100 mg by mouth 2 (two) times daily.    . furosemide (LASIX) 20 MG tablet Take 1 tablet (20 mg total) by mouth daily. 30 tablet 5  . HYDROcodone-acetaminophen (NORCO/VICODIN) 5-325 MG per tablet Take 1 tablet by mouth every 6 (six) hours as needed for moderate pain or severe pain. 30 tablet 0  . hydrocortisone (PROCTOSOL HC) 2.5 % rectal cream Place 1 application rectally 2 (two) times daily.    . lansoprazole (PREVACID) 30 MG capsule Take 1 capsule (30 mg total) by mouth daily at 12 noon. 30 capsule 5  . nitroGLYCERIN (NITROSTAT) 0.4 MG SL tablet Place 0.4 mg under the tongue every 5 (five)  minutes as needed. For chest pain    . PROAIR HFA 108 (90 BASE) MCG/ACT inhaler INHALE 2 PUFFS INTO THE LUNGS EVERY 4 HOURS AS NEEDED FOR SHORTNESS OF BREATH 8.5 g 2   No current facility-administered medications for this visit.   Facility-Administered Medications Ordered in Other Visits  Medication Dose Route Frequency Provider Last Rate Last Dose  . lactated ringers infusion    Continuous PRN Chyrel Masson, CRNA        Allergies:   Review of patient's allergies indicates no known allergies.    Social History:  The patient  reports that he has been smoking Cigarettes.  He has a 40 pack-year smoking  history. He does not have any smokeless tobacco history on file. He reports that he drinks alcohol. He reports that he uses illicit drugs (Cocaine).   Family History:  The patient's family history includes Diabetes in an other family member; Hypertension in his father.  His mother died of a gunshot wound when he was 88.   ROS:  Please see the history of present illness.   Otherwise, review of systems are positive for none.   All other systems are reviewed and negative.    PHYSICAL EXAM: VS:  BP 130/70 mmHg  Pulse 80  Ht 5\' 4"  (1.626 m)  Wt 328 lb (148.78 kg)  BMI 56.27 kg/m2 , BMI Body mass index is 56.27 kg/(m^2). GEN: Well nourished, well developed, in no acute distress HEENT: normal Neck: no JVD, carotid bruits, or masses Cardiac: RRR; no murmurs, rubs, or gallops,no edema  Respiratory:  clear to auscultation bilaterally, normal work of breathing GI: soft, nontender, nondistended, + BS, obese MS: no deformity or atrophy Skin: warm and dry, no rash Neuro:  Strength and sensation are intact Psych: euthymic mood, full affect Ext:  Mild edema   EKG:  EKG is not ordered today. The ekg ordered today demonstrates See above for 2/4 and 2/5 EKG    Recent Labs: 04/27/2014: B Natriuretic Peptide 15.5; BUN 15; Creatinine 1.00; Hemoglobin 17.3*; Platelets 266; Potassium 3.9; Sodium 141    Lipid Panel    Component Value Date/Time   CHOL 253* 05/12/2009 0033   TRIG 76 05/12/2009 0033   HDL 44 05/12/2009 0033   CHOLHDL 5.8 Ratio 05/12/2009 0033   VLDL 15 05/12/2009 0033   LDLCALC 194* 05/12/2009 0033      Wt Readings from Last 3 Encounters:  05/13/14 328 lb (148.78 kg)  05/01/14 329 lb (149.233 kg)  10/30/13 327 lb (148.326 kg)      Other studies Reviewed: Additional studies/ records that were reviewed today include: ED records, echo 2013, cath 2005. Review of the above records demonstrates: See above   ASSESSMENT AND PLAN:  SYNCOPE:  The etiology of this is unclear.  This could've been a vagal episode he was coughing . I am going to apply an event monitor.   Kenneth [name] will need a 21 day event monitor.  The patients symptoms necessitate an event monitor.  The symptoms are too infrequent to be identified on a Holter monitor.   He was not orthostatic in the office.  ABNORMAL EKG:  His QTC was slightly prolonged on his initial EKG that think this is unrelated. Follow-up EKG demonstrated normalization of this. He will have his event monitor as described.   ELEVATED BS:  I do note that multiple recent blood sugars were elevated but probably nonfasting. I don't see a hemoglobin A1c. will defer to [facility], PA-C  Current medicines are reviewed at length with the patient today.  The patient does not have concerns regarding medicines.  The following changes have been made:  no change  Labs/ tests ordered today include: Event monitor  No orders of the defined types were placed in this encounter.    Disposition:   FU with me as needed.  Signed, Minus Breeding, MD  05/13/2014 9:09 AM    [place]

## 2014-05-13 NOTE — Patient Instructions (Signed)
Your physician recommends that you schedule a follow-up appointment in: as needed with Dr. Ludwig Clarks this monitor for 21 days then mail it back to [employer]

## 2014-05-15 ENCOUNTER — Telehealth: Payer: Self-pay | Admitting: Physician Assistant

## 2014-05-15 MED ORDER — HYDROCODONE-ACETAMINOPHEN 5-325 MG PO TABS: 1.0000 | ORAL_TABLET | Freq: Four times a day (QID) | ORAL | Status: DC | PRN

## 2014-05-15 NOTE — Telephone Encounter (Signed)
Rx printed and pt aware ready for pickup 

## 2014-05-15 NOTE — Telephone Encounter (Signed)
Patient is calling to see if he can get pain meds for his foot and leg pain  [phone]

## 2014-05-15 NOTE — Telephone Encounter (Signed)
Can have 30 norco 5/325 q 6 hrs prn pain

## 2014-05-18 ENCOUNTER — Telehealth: Payer: Self-pay | Admitting: Internal Medicine

## 2014-05-18 NOTE — Telephone Encounter (Signed)
Received call from [employer] that the patient had a 5 sec pause on monitor today. He did not activate the monitor. I attempted to call him at [phone] and left a message on his VM and advised him to call me back (left pager number) or call Dr. Percival Spanish office in AM.  Raliegh Ip, MD MPH 12:58a

## 2014-05-19 ENCOUNTER — Encounter: Payer: Self-pay | Admitting: Cardiology

## 2014-05-19 ENCOUNTER — Institutional Professional Consult (permissible substitution): Payer: Medicare Other | Admitting: Neurology

## 2014-05-19 ENCOUNTER — Telehealth: Payer: Self-pay | Admitting: Cardiology

## 2014-05-19 ENCOUNTER — Ambulatory Visit (INDEPENDENT_AMBULATORY_CARE_PROVIDER_SITE_OTHER): Payer: Medicare Other | Admitting: Cardiology

## 2014-05-19 VITALS — BP 142/88 | HR 92 | Ht 64.0 in | Wt 325.4 lb

## 2014-05-19 DIAGNOSIS — R001 Bradycardia, unspecified: Secondary | ICD-10-CM

## 2014-05-19 NOTE — Telephone Encounter (Signed)
Abnormal EKG report ?

## 2014-05-19 NOTE — Progress Notes (Signed)
Cardiology Office Note   Date:  05/19/2014   ID:  Kenneth [name], DOB May 28, 1960, MRN [id number]  PCP:  Odette Fraction, MD  Cardiologist:   Minus Breeding, MD   No chief complaint on file.     History of Present Illness: Kenneth [name] is a 54 y.o. male who presents for for evaluation of a syncopal episode. I called him today to review monitor results which demonstrate several 5-6 seconds pauses.  I called him brought him to the office to discuss these. These happen he was asleep.  It turns out that he was at the sleep center registering for a sleep study.  He said he got a telephone call last night. He didn't notice anything and was asleep when his heart rate slowed. He has had none of the further syncopal episodes that brought him to the hospital in for evaluation here. [name] not describing any presyncope or syncope. [name] not been having any chest pressure, neck or arm discomfort.  Past Medical History  Diagnosis Date  . Hypercholesteremia   . Hypertension   . Chronic bronchitis   . Asthma   . GERD (gastroesophageal reflux disease)   . Arthritis     Past Surgical History  Procedure Laterality Date  . Finger amputation    . Esophagogastroduodenoscopy (egd) with propofol N/A 12/11/2012    Procedure: ESOPHAGOGASTRODUODENOSCOPY (EGD) WITH PROPOFOL;  Surgeon: Arta Silence, MD;  Location: [facility];  Service: Endoscopy;  Laterality: N/A;     Current Outpatient Prescriptions  Medication Sig Dispense Refill  . aspirin 81 MG tablet Take 81 mg by mouth daily.    . diclofenac (VOLTAREN) 75 MG EC tablet TAKE 1 TABLET BY MOUTH 2 TIMES DAILY. 60 tablet 3  . docusate sodium (COLACE) 100 MG capsule Take 100 mg by mouth 2 (two) times daily.    . furosemide (LASIX) 20 MG tablet Take 1 tablet (20 mg total) by mouth daily. 30 tablet 5  . HYDROcodone-acetaminophen (NORCO/VICODIN) 5-325 MG per tablet Take 1 tablet by mouth every 6 (six) hours as needed for moderate pain or severe pain. 30  tablet 0  . hydrocortisone (PROCTOSOL HC) 2.5 % rectal cream Place 1 application rectally 2 (two) times daily.    . lansoprazole (PREVACID) 30 MG capsule Take 1 capsule (30 mg total) by mouth daily at 12 noon. 30 capsule 5  . nitroGLYCERIN (NITROSTAT) 0.4 MG SL tablet Place 0.4 mg under the tongue every 5 (five) minutes as needed. For chest pain    . PROAIR HFA 108 (90 BASE) MCG/ACT inhaler INHALE 2 PUFFS INTO THE LUNGS EVERY 4 HOURS AS NEEDED FOR SHORTNESS OF BREATH 8.5 g 2   No current facility-administered medications for this visit.   Facility-Administered Medications Ordered in Other Visits  Medication Dose Route Frequency Provider Last Rate Last Dose  . lactated ringers infusion    Continuous PRN Chyrel Masson, CRNA        Allergies:   Review of patient's allergies indicates no known allergies.    ROS:  Please see the history of present illness.   Otherwise, review of systems are positive for none.   All other systems are reviewed and negative.    PHYSICAL EXAM: VS:  BP 142/88 mmHg  Pulse 92  Ht 5\' 4"  (1.626 m)  Wt 325 lb 6.4 oz (147.6 kg)  BMI 55.83 kg/m2 , BMI Body mass index is 55.83 kg/(m^2). GENERAL:  Well appearing NECK:  No jugular venous distention, waveform within  normal limits, carotid upstroke brisk and symmetric, no bruits, no thyromegaly LUNGS:  Clear to auscultation bilaterally BACK:  No CVA tenderness CHEST:  Unremarkable HEART:  PMI not displaced or sustained,S1 and S2 within normal limits, no S3, no S4, no clicks, no rubs, no murmurs ABD:  Flat, positive bowel sounds normal in frequency in pitch, no bruits, no rebound, no guarding, no midline pulsatile mass, no hepatomegaly, no splenomegaly, obese EXT:  2 plus pulses throughout, no edema, no cyanosis no clubbing    Wt Readings from Last 3 Encounters:  05/19/14 325 lb 6.4 oz (147.6 kg)  05/13/14 328 lb (148.78 kg)  05/01/14 329 lb (149.233 kg)      Other studies Reviewed: Additional studies/  records that were reviewed today include:  Event monitor Review of the above records demonstrates: See above   ASSESSMENT AND PLAN:  SYNCOPE:  The etiology of this is unclear st this could've been a vagal related to coughing. I don't think this had anything to do with his bradycardia clearly related to sleep apnea. ill   ABNORMAL EKG:  His QTC was slightly prolonged on his initial EKG that think this is unrelated. Follow-up EKG demonstrated normalization of this.   ELEVATED BS:  I do note that multiple recent blood sugars were elevated but probably nonfasting. I don't see a hemoglobin A1c. will defer to [place], MD   BRADYCARDIA:  This is clearly related to the sleep apnea treatment should be treatment diagnosis and treatment of this.  I spoke with the neurologist who kindly agreed to try to reschedule his consultation for sleep apnea.  Current medicines are reviewed at length with the patient today.  The patient does not have concerns regarding medicines.  The following changes have been made:  no change  Labs/ tests ordered today include: Event monitor  No orders of the defined types were placed in this encounter.    Disposition:   FU with me as needed.  Signed, Minus Breeding, MD  05/19/2014 11:25 AM    [facility]

## 2014-05-19 NOTE — Telephone Encounter (Signed)
Paged by [facility] for abnormal ECG.   They reported that the patient had a 3.9 second pause in the setting of [place] II HB.   I called the patient and he was asleep and therefore asymptomatic at that time.   Since the pause was relatively short and occurred exclusively while asleep, I do not think he requires emergent evaluation. I did ask him to call Dr. Percival Spanish in the AM as he can review the actual strips and make arrangements for a referral to an EP if needed. He agrees with this pain. He will continue to be monitored by [facility] in the interim.

## 2014-05-19 NOTE — Patient Instructions (Signed)
Your physician recommends that you schedule a follow-up appointment in: as needed with Dr. Percival Spanish after your sleep study is completed  Have one of the staff at the sleep center call Dr. Percival Spanish , he would like to speak to some one about you most recent cardiac activity

## 2014-05-19 NOTE — Telephone Encounter (Signed)
Received call from [place].   Pt had Advanced Heart block with 5.9 second pause.  Sending over report now.   Pt seen in office today by Dr. Percival Spanish. MD was aware of this pause. It has been addressed by Dr. Percival Spanish with the patient.

## 2014-05-21 ENCOUNTER — Telehealth: Payer: Self-pay | Admitting: Cardiology

## 2014-05-21 NOTE — Telephone Encounter (Signed)
Abnormal EKG  

## 2014-05-21 NOTE — Telephone Encounter (Signed)
Discussed w/ Dr. Sallyanne Kuster, he reviewed & signed.  Noted Dr. Rosezella [place] notes from 2/23 OV.

## 2014-05-21 NOTE — Telephone Encounter (Signed)
[facility] called to alert [name] on 2 autotrigger events for this patient of Dr. Percival Spanish:  Monitor showing for heart block:  2:13am - 2 sec pause 7:00am - 3.6 sec pause  After each event pt returned to SR w/ 1st deg.   They are faxing his event strips.

## 2014-05-22 ENCOUNTER — Encounter: Payer: Self-pay | Admitting: Neurology

## 2014-05-22 ENCOUNTER — Ambulatory Visit (INDEPENDENT_AMBULATORY_CARE_PROVIDER_SITE_OTHER): Payer: Medicare Other | Admitting: Neurology

## 2014-05-22 VITALS — BP 142/76 | HR 79 | Temp 97.6°F | Resp 28 | Ht 68.0 in | Wt 327.0 lb

## 2014-05-22 DIAGNOSIS — G4719 Other hypersomnia: Secondary | ICD-10-CM

## 2014-05-22 DIAGNOSIS — R51 Headache: Secondary | ICD-10-CM | POA: Diagnosis not present

## 2014-05-22 DIAGNOSIS — R351 Nocturia: Secondary | ICD-10-CM

## 2014-05-22 DIAGNOSIS — R001 Bradycardia, unspecified: Secondary | ICD-10-CM | POA: Diagnosis not present

## 2014-05-22 DIAGNOSIS — R55 Syncope and collapse: Secondary | ICD-10-CM

## 2014-05-22 DIAGNOSIS — G4733 Obstructive sleep apnea (adult) (pediatric): Secondary | ICD-10-CM

## 2014-05-22 DIAGNOSIS — R519 Headache, unspecified: Secondary | ICD-10-CM

## 2014-05-22 NOTE — Patient Instructions (Signed)
Based on your symptoms and your exam I believe you are at risk for obstructive sleep apnea or OSA, and I think we should proceed with a sleep study to determine whether you do or do not have OSA and how severe it is. If you have more than mild OSA, I want you to consider treatment with CPAP. Please remember, the risks and ramifications of moderate to severe obstructive sleep apnea or OSA are: Cardiovascular disease, including congestive heart failure, stroke, difficult to control hypertension, arrhythmias, and even type 2 diabetes has been linked to untreated OSA. Sleep apnea causes disruption of sleep and sleep deprivation in most cases, which, in turn, can cause recurrent headaches, problems with memory, mood, concentration, focus, and vigilance. Most people with untreated sleep apnea report excessive daytime sleepiness, which can affect their ability to drive. Please do not drive if you feel sleepy.  I will see you back after your sleep study to go over the test results and where to go from there. We will call you after your sleep study and to set up an appointment at the time.   Please remember to try to maintain good sleep hygiene, which means: Keep a regular sleep and wake schedule, try not to exercise or have a meal within 2 hours of your bedtime, try to keep your bedroom conducive for sleep, that is, cool and dark, without light distractors such as an illuminated alarm clock, and refrain from watching TV right before sleep or in the middle of the night and do not keep the TV or radio on during the night. Also, try not to use or play on electronic devices at bedtime, such as your cell phone, tablet PC or laptop. If you like to read at bedtime on an electronic device, try to dim the background light as much as possible. Do not eat in the middle of the night.    Please reduce your soda intake and quit smoking.

## 2014-05-22 NOTE — Progress Notes (Signed)
Subjective:    Patient ID: Kenneth [name] is a 54 y.o. male.  HPI      Star Age, MD, PhD [facility] [address] [place], [place] [address]  Dear Dr. Dennard Schaumann,   I saw your patient, Kenneth [name], upon your kind request in my neurologic clinic today for initial consultation of his sleep disorder, in particular, concern for underlying obstructive sleep apnea. The patient is unaccompanied today. As you know, [name] is a 54 year old right-handed gentleman with an underlying medical history of hyperlipidemia, coronary artery disease, reflux disease, morbid obesity, arthritis, chronic bronchitis in the setting of smoking, obesity hypoventilation syndrome and recent syncopal event, who reports snoring and daytime somnolence. He has a cardiac monitor and I spoke with his cardiologist on the phone on 05/19/14; the patient was having some 5-6 s pauses. His Holter monitor was placed earlier this month. On 04/30/2014 he had a syncopal spell. He was in the emergency room for this. He has seen Dr. Percival Spanish for cardiac workup. He has been advised to have a sleep study. He snores, he wakes up not refreshed and he has multiple nighttime awakenings and nocturia at least 4-5 times per night. His Epworth sleepiness score is 19 out of 24. He goes to bed around 9 PM and while he falls asleep fairly quickly he wakes up almost every hour he says. Sometimes he gets out of bed around 3 AM and never goes back to sleep. He denies restless leg symptoms but has some aching in his legs. He has lower extremity swelling. He wakes up with a headache at times. He is not aware of any family history of obstructive sleep apnea. He has never had a sleep study. He has been overweight for years. He snores and this is at times quite loud. [name] not sure if he actually quits breathing in his sleep but has woken up with a sense of gasping for air. He drinks about 2 Coca-Cola as per day. He does  not drink coffee. He occasionally drinks alcohol. He smokes one pack per day.  He lives with his brother and his sister-in-law. He is divorced. He does not have any children. There are no pets in his bed. Occasionally he watches TV in bed. He does not work.   His Past Medical History Is Significant For: Past Medical History  Diagnosis Date  . Hypercholesteremia   . Hypertension   . Chronic bronchitis   . Asthma   . GERD (gastroesophageal reflux disease)   . Arthritis     His Past Surgical History Is Significant For: Past Surgical History  Procedure Laterality Date  . Finger amputation    . Esophagogastroduodenoscopy (egd) with propofol N/A 12/11/2012    Procedure: ESOPHAGOGASTRODUODENOSCOPY (EGD) WITH PROPOFOL;  Surgeon: Arta Silence, MD;  Location: [facility];  Service: Endoscopy;  Laterality: N/A;    His Family History Is Significant For: Family History  Problem Relation Age of Onset  . Hypertension Father   . Diabetes      Multiple family members  . Diabetes Father   . Diabetes Sister   . Diabetes Brother   . Stroke Sister     His Social History Is Significant For: History   Social History  . Marital Status: Divorced    Spouse Name: N/A  . Number of Children: 0  . Years of Education: N/A   Social History Main Topics  . Smoking status: Current Every Day Smoker -- 1.00 packs/day  for 40 years    Types: Cigarettes  . Smokeless tobacco: Not on file  . Alcohol Use: 0.0 oz/week    0 Standard drinks or equivalent per week     Comment: some  . Drug Use: Yes    Special: Cocaine     Comment: patient states none- read lab report 09/02/2012/ 11-20-12 case cx.due to positive screen test-pt. advised no recreational drugs to be used  . Sexual Activity: Not on file   Other Topics Concern  . None   Social History Narrative   Lives with brother and his sister in [place].      His Allergies Are:  No Known Allergies:   His Current Medications Are:  Outpatient Encounter  Prescriptions as of 05/22/2014  Medication Sig  . aspirin 81 MG tablet Take 81 mg by mouth daily.  . diclofenac (VOLTAREN) 75 MG EC tablet TAKE 1 TABLET BY MOUTH 2 TIMES DAILY.  Marland Kitchen docusate sodium (COLACE) 100 MG capsule Take 100 mg by mouth 2 (two) times daily.  . furosemide (LASIX) 20 MG tablet Take 1 tablet (20 mg total) by mouth daily.  Marland Kitchen HYDROcodone-acetaminophen (NORCO/VICODIN) 5-325 MG per tablet Take 1 tablet by mouth every 6 (six) hours as needed for moderate pain or severe pain.  . hydrocortisone (PROCTOSOL HC) 2.5 % rectal cream Place 1 application rectally 2 (two) times daily.  . lansoprazole (PREVACID) 30 MG capsule Take 1 capsule (30 mg total) by mouth daily at 12 noon.  . nitroGLYCERIN (NITROSTAT) 0.4 MG SL tablet Place 0.4 mg under the tongue every 5 (five) minutes as needed. For chest pain  . PROAIR HFA 108 (90 BASE) MCG/ACT inhaler INHALE 2 PUFFS INTO THE LUNGS EVERY 4 HOURS AS NEEDED FOR SHORTNESS OF BREATH  :  Review of Systems:  Out of a complete 14 point review of systems, all are reviewed and negative with the exception of these symptoms as listed below:   Review of Systems  Eyes:       Blurred vision   Respiratory: Positive for cough and shortness of breath.        Snoring  Cardiovascular: Positive for chest pain.       Murmur and Swelling both legs   Endocrine:       Increased thirst  Musculoskeletal:       Joint swelling and pain Aching muscles  Neurological: Positive for syncope.       Sleepiness and snoring  Psychiatric/Behavioral:       Not enough sleep     Objective:  Neurologic Exam  Physical Exam Physical Examination:   Filed Vitals:   05/22/14 1202  BP: 142/76  Pulse: 79  Temp: 97.6 F (36.4 C)  Resp: 28    General Examination: The patient is a very pleasant 54 y.o. male in no acute distress. He is morbidly obese. He is adequately groomed.   HEENT: Normocephalic, atraumatic, pupils are equal, round and reactive to light and  accommodation. Funduscopic exam is normal with sharp disc margins noted. Extraocular tracking is good without limitation to gaze excursion or nystagmus noted. Normal smooth pursuit is noted. Hearing is grossly intact. Tympanic membranes are clear bilaterally. Face is symmetric with normal facial animation and normal facial sensation. Speech is clear with no dysarthria noted. There is no hypophonia. There is no lip, neck/head, jaw or voice tremor. Neck is supple with full range of passive and active motion. There are no carotid bruits on auscultation. Oropharynx exam reveals: moderate mouth dryness, adequate  dental hygiene and marked airway crowding, due to large tongue, redundant and thick soft palate, narrow airway entry, and tonsils were not visualized. Mallampati is class III. Tongue protrudes centrally and palate elevates symmetrically. Neck size is 20.25 inches. He has a Mild overbite.   Chest: Clear to auscultation without wheezing, rhonchi or crackles noted.  Heart: S1+S2+0, regular and normal without murmurs, rubs or gallops noted.   Abdomen: Soft, non-tender and non-distended with normal bowel sounds appreciated on auscultation.  Extremities: There is 2+ pitting edema in the distal lower extremities bilaterally, left worse than right. Chronic discoloration and stasis-like changes are seen. Pedal pulses are intact.  Skin: Warm and dry without trophic changes noted. There are no varicose veins.  Musculoskeletal: exam reveals no obvious joint deformities, tenderness or joint swelling or erythema, with the exception of pain and decreased range of motion of his left leg. He said he injured his left leg in the past.   Neurologically:  Mental status: The patient is awake, alert and oriented in all 4 spheres. His immediate and remote memory, attention, language skills and fund of knowledge are appropriate. There is no evidence of aphasia, agnosia, apraxia or anomia. Speech is clear with normal  prosody and enunciation. Thought process is linear. Mood is normal and affect is normal.  Cranial nerves II - XII are as described above under HEENT exam. In addition: shoulder shrug is normal with equal shoulder height noted. Motor exam: Normal bulk, strength and tone is noted. There is no drift, tremor or rebound. Romberg is negative. Reflexes are 2+ throughout. Babinski: Toes are flexor bilaterally. Fine motor skills and coordination: intact with normal finger taps, normal hand movements, normal rapid alternating patting, normal foot taps and normal foot agility.  Cerebellar testing: No dysmetria or intention tremor on finger to nose testing. Heel to shin is difficult for him due to body habitus. There is no truncal or gait ataxia.  Sensory exam: intact to light touch, pinprick, vibration, temperature sense in the upper and lower extremities.  Gait, station and balance: He stands with difficulty. No veering to one side is noted. No leaning to one side is noted. Posture is age-appropriate and stance is wide-based, likely secondary to body habitus. He walks slowly. He is not able to do tandem walk because of left leg pain.               Assessment and Plan:   In summary, KENT RIENDEAU is a very pleasant 54 y.o.-year old male with an underlying medical history of hyperlipidemia, coronary artery disease, reflux disease, morbid obesity, arthritis, chronic bronchitis in the setting of smoking, obesity hypoventilation syndrome and recent syncopal event earlier this month, currently on a Holter monitor, whose history and physical exam are in keeping with obstructive sleep apnea. He has telltale symptoms and his physical exam is highly concerning for underlying OSA. I had a long chat with the patient about my findings and the diagnosis of OSA, its prognosis and treatment options. We talked about medical treatments, surgical interventions and non-pharmacological approaches. I explained in particular the risks and  ramifications of untreated moderate to severe OSA, especially with respect to developing cardiovascular disease down the Road, including congestive heart failure, difficult to treat hypertension, cardiac arrhythmias, or stroke. Even type 2 diabetes has, in part, been linked to untreated OSA. Symptoms of untreated OSA include daytime sleepiness, memory problems, mood irritability and mood disorder such as depression and anxiety, lack of energy, as well as recurrent headaches,  especially morning headaches. We talked about smoking cessation and trying to maintain a healthy lifestyle in general, as well as the importance of weight control. I encouraged the patient to eat healthy, exercise daily and keep well hydrated, to keep a scheduled bedtime and wake time routine, to not skip any meals and eat healthy snacks in between meals. I advised the patient not to drive when feeling sleepy. I recommended the following at this time: sleep study with potential positive airway pressure titration. (We will score hypopneas at 4 and split the sleep study into diagnostic and treatment portion, if the estimated. 2 hour AHI is >15).   I explained the sleep test procedure to the patient and also outlined possible surgical and non-surgical treatment options of OSA, including the use of a custom-made dental device (which would require a referral to a specialist dentist or oral surgeon), upper airway surgical options, such as pillar implants, radiofrequency surgery, tongue base surgery, and UPPP (which would involve a referral to an ENT surgeon). Rarely, jaw surgery such as mandibular advancement may be considered.  I also explained the CPAP treatment option to the patient, who indicated that he would be willing to try CPAP if the need arises. I explained the importance of being compliant with PAP treatment, not only for insurance purposes but primarily to improve His symptoms, and for the patient's long term health benefit, including  to reduce His cardiovascular risks. I answered all his questions today and the patient was in agreement. I would like to see him back after the sleep study is completed and encouraged him to call with any interim questions, concerns, problems or updates.   Thank you very much for allowing me to participate in the care of this nice patient. If I can be of any further assistance to you please do not hesitate to call me at [phone].  Sincerely,   Star Age, MD, PhD

## 2014-05-25 ENCOUNTER — Ambulatory Visit (INDEPENDENT_AMBULATORY_CARE_PROVIDER_SITE_OTHER): Payer: Medicare Other | Admitting: Neurology

## 2014-05-25 DIAGNOSIS — G479 Sleep disorder, unspecified: Secondary | ICD-10-CM

## 2014-05-25 DIAGNOSIS — G4733 Obstructive sleep apnea (adult) (pediatric): Secondary | ICD-10-CM | POA: Diagnosis not present

## 2014-05-25 DIAGNOSIS — G4730 Sleep apnea, unspecified: Secondary | ICD-10-CM

## 2014-05-25 DIAGNOSIS — G4734 Idiopathic sleep related nonobstructive alveolar hypoventilation: Secondary | ICD-10-CM

## 2014-05-25 DIAGNOSIS — G4710 Hypersomnia, unspecified: Secondary | ICD-10-CM

## 2014-05-25 DIAGNOSIS — R9431 Abnormal electrocardiogram [ECG] [EKG]: Secondary | ICD-10-CM

## 2014-05-26 ENCOUNTER — Telehealth: Payer: Self-pay

## 2014-05-26 NOTE — Sleep Study (Signed)
Please see the scanned sleep study interpretation located in the Procedure tab within the Chart Review section. 

## 2014-05-26 NOTE — Telephone Encounter (Signed)
Received call from Miranda with [facility] to report 3.6 sec pause.Dr.Hochrein reviewed.Advised patient has sleep apnea.

## 2014-05-27 ENCOUNTER — Telehealth: Payer: Self-pay | Admitting: Neurology

## 2014-05-27 ENCOUNTER — Encounter: Payer: Self-pay | Admitting: Neurology

## 2014-05-27 ENCOUNTER — Telehealth: Payer: Self-pay | Admitting: Family Medicine

## 2014-05-27 DIAGNOSIS — G4734 Idiopathic sleep related nonobstructive alveolar hypoventilation: Secondary | ICD-10-CM

## 2014-05-27 DIAGNOSIS — G4733 Obstructive sleep apnea (adult) (pediatric): Secondary | ICD-10-CM

## 2014-05-27 NOTE — Telephone Encounter (Signed)
Please call and notify patient that the recent sleep study confirmed the diagnosis of OSA, which was SEVERE and while we did not achieve complete resolution off his obstructive sleep apnea on CPAP or BiPAP, I would like for him to start BiPAP therapy at home in the form of auto BiPAP. I have placed an order. Furthermore, due to ongoing desaturations during the treatment portion of the study I would also like to eventually do an overnight pulse oximetry test approximately 2 weeks after he has been set up on BiPAP at home, through the same DME co.    Arrange for BiPAP set up at home through a DME company of patient's choice and fax/route report to PCP and referring MD (if other than PCP).   The patient will also need a follow up appointment with me in 6-8 weeks post set up that has to be scheduled; help the patient schedule this (in a follow-up slot).    Please re-enforce the importance of compliance with treatment and the need for [name] to monitor compliance data.   Once you have spoken to the patient and scheduled the return appointment, you may close this encounter, thanks,   Star Age, MD, PhD [facility] ([place])

## 2014-05-27 NOTE — Telephone Encounter (Signed)
Pls route reports to PCP and cardiologist, thx

## 2014-05-27 NOTE — Telephone Encounter (Signed)
Pt just had sleep study, wanted to know about results.  Told takes about one week to get full report.  Also,  Needs to pick up written RX for ankle brace so he can take to DME company to get through insurance.

## 2014-05-28 ENCOUNTER — Encounter: Payer: Self-pay | Admitting: Neurology

## 2014-05-28 NOTE — Telephone Encounter (Signed)
Please see Neurology's note from 3/2.  He has severe OSA and needs BiPAP and neurology appears to be scheduling.

## 2014-05-28 NOTE — Telephone Encounter (Signed)
ntbs if ankle is still hurting that bad

## 2014-05-29 ENCOUNTER — Encounter: Payer: Self-pay | Admitting: *Deleted

## 2014-05-29 ENCOUNTER — Telehealth: Payer: Self-pay | Admitting: Cardiology

## 2014-05-29 NOTE — Telephone Encounter (Addendum)
Incoming call from [employer], reported a 4-5 second pause this AM for patient on autotrigger event. Advanced HB per report.  Fax pending, will alert Dr. Percival Spanish when received.

## 2014-05-29 NOTE — Telephone Encounter (Signed)
The patient's sister [name] returned my call and was informed that her brother Kenneth [name] had been referred to [facility] for BIPAP referral.  The patient's sister handles all of his medical affairs.  Both the patients PCP and Cardiologist were routed copies of the report.  The patient came and picked up a copy of his sleep study report in person.   Patient instructed to contact our office 6-8 weeks post set up to schedule a follow up appointment.

## 2014-05-29 NOTE — Telephone Encounter (Signed)
Spoke to pt sister.  Aware needs to call [facility] for sleep study results and follow up.   Told NTBS for ankle pain.

## 2014-05-29 NOTE — Telephone Encounter (Signed)
Dr. Percival Spanish saw result strip. OK'd. OK to discontinuing monitor as HB pauses are related to sleep apnea & this will need to be treated before pauses are resolved.  Called pt, advised he can send monitor back. He voiced understanding. Requested f/u when results ready.

## 2014-06-04 ENCOUNTER — Telehealth: Payer: Self-pay | Admitting: Neurology

## 2014-06-04 NOTE — Telephone Encounter (Signed)
Sister Vershawn Westrup) is calling to inquire about patient's sleep equipment, has not heard anything. Please call.

## 2014-06-05 NOTE — Telephone Encounter (Signed)
[facility] was contacted and they state they have made multiple calls to patient.

## 2014-06-09 ENCOUNTER — Other Ambulatory Visit: Payer: Self-pay | Admitting: Physician Assistant

## 2014-06-09 NOTE — Telephone Encounter (Signed)
Medication refilled per protocol. 

## 2014-06-09 NOTE — Telephone Encounter (Signed)
Spoke with sister and she said that [facility] did contact her and will come out on 06/10/14. They had patient's number and that is why they could not reach her.

## 2014-06-25 ENCOUNTER — Ambulatory Visit (INDEPENDENT_AMBULATORY_CARE_PROVIDER_SITE_OTHER): Payer: Medicare Other | Admitting: Family Medicine

## 2014-06-25 ENCOUNTER — Encounter: Payer: Self-pay | Admitting: Family Medicine

## 2014-06-25 VITALS — BP 100/64 | HR 88 | Temp 97.8°F | Resp 22 | Ht 66.0 in | Wt 325.0 lb

## 2014-06-25 DIAGNOSIS — M67972 Unspecified disorder of synovium and tendon, left ankle and foot: Secondary | ICD-10-CM

## 2014-06-25 DIAGNOSIS — M25572 Pain in left ankle and joints of left foot: Secondary | ICD-10-CM

## 2014-06-25 NOTE — Progress Notes (Signed)
Subjective:    Patient ID: Kenneth [name], male    DOB: 01/17/1961, 54 y.o.   MRN: [id number]  HPI  Please see my last office visit. Patient had a syncopal episode. Patient twisted and injured his left ankle after he lost consciousness falling out of his car. At the time the majority of our effort was spent in determining the cause of his syncope. He has since seen the cardiologist.  An event monitor was scheduled. The event monitor was later canceled as he continued to have episodes of 4-5 second pauses due to sleep apnea. He is also seen a neurologist and had a sleep study which was significant for severe sleep apnea. Patient is being treated for sleep apnea by the sleep specialist. Over the last month he continues to complain of severe pain in his left ankle. Initial x-rays in the emergency room were negative for acute fracture. Today the patient states that he cannot bear weight on his ankle without severe pain. The pain initiates in the plantar aspect of his calcaneus and radiates up his Achilles tendon into his left calf. He has severe pain in his Achilles tendon with resisted dorsiflexion. Dorsiflexion against resistance is normal although it trigger some tenderness in the Achilles tendon. Patient also has tenderness to palpation on each side of the calcaneus as well as the lateral medial malleolus. Of note, I observed the patient walking to his car without a limp.  Patient continues to request pain medication for the ankle pain Past Medical History  Diagnosis Date  . Hypercholesteremia   . Hypertension   . Chronic bronchitis   . Asthma   . GERD (gastroesophageal reflux disease)   . Arthritis    Past Surgical History  Procedure Laterality Date  . Finger amputation    . Esophagogastroduodenoscopy (egd) with propofol N/A 12/11/2012    Procedure: ESOPHAGOGASTRODUODENOSCOPY (EGD) WITH PROPOFOL;  Surgeon: Arta Silence, MD;  Location: [facility];  Service: Endoscopy;  Laterality: N/A;    Current Outpatient Prescriptions on File Prior to Visit  Medication Sig Dispense Refill  . aspirin 81 MG tablet Take 81 mg by mouth daily.    . diclofenac (VOLTAREN) 75 MG EC tablet TAKE 1 TABLET BY MOUTH 2 TIMES DAILY. 60 tablet 3  . docusate sodium (COLACE) 100 MG capsule Take 100 mg by mouth 2 (two) times daily.    . furosemide (LASIX) 20 MG tablet TAKE 1 TABLET BY MOUTH DAILY. 30 tablet 5  . HYDROcodone-acetaminophen (NORCO/VICODIN) 5-325 MG per tablet Take 1 tablet by mouth every 6 (six) hours as needed for moderate pain or severe pain. 30 tablet 0  . hydrocortisone (PROCTOSOL HC) 2.5 % rectal cream Place 1 application rectally 2 (two) times daily.    . lansoprazole (PREVACID) 30 MG capsule Take 1 capsule (30 mg total) by mouth daily at 12 noon. 30 capsule 5  . nitroGLYCERIN (NITROSTAT) 0.4 MG SL tablet Place 0.4 mg under the tongue every 5 (five) minutes as needed. For chest pain    . PROAIR HFA 108 (90 BASE) MCG/ACT inhaler INHALE 2 PUFFS INTO THE LUNGS EVERY 4 HOURS AS NEEDED FOR SHORTNESS OF BREATH 8.5 g 2   Current Facility-Administered Medications on File Prior to Visit  Medication Dose Route Frequency Provider Last Rate Last Dose  . lactated ringers infusion    Continuous PRN Chyrel Masson, CRNA       No Known Allergies History   Social History  . Marital Status: Divorced  Spouse Name: N/A  . Number of Children: 0  . Years of Education: N/A   Occupational History  . Not on file.   Social History Main Topics  . Smoking status: Current Every Day Smoker -- 1.00 packs/day for 40 years    Types: Cigarettes  . Smokeless tobacco: Not on file  . Alcohol Use: 0.0 oz/week    0 Standard drinks or equivalent per week     Comment: some  . Drug Use: Yes    Special: Cocaine     Comment: patient states none- read lab report 09/02/2012/ 11-20-12 case cx.due to positive screen test-pt. advised no recreational drugs to be used  . Sexual Activity: Not on file   Other Topics  Concern  . Not on file   Social History Narrative   Lives with brother and his sister in [place].       Review of Systems  All other systems reviewed and are negative.      Objective:   Physical Exam  Cardiovascular: Normal rate, regular rhythm and normal heart sounds.   Pulmonary/Chest: Effort normal and breath sounds normal.  Musculoskeletal:       Left ankle: He exhibits decreased range of motion. He exhibits no swelling, no ecchymosis and no laceration. Tenderness. Lateral malleolus tenderness found. Achilles tendon exhibits pain and abnormal Thompson's test results. Achilles tendon exhibits no defect.          Assessment & Plan:  Left ankle pain - Plan: MR Ankle Left W Wo Contrast  Achilles tendon disorder, left - Plan: MR Ankle Left W Wo Contrast  I am concerned the patient may have a partial tear in his Achilles tendon. His pain is certainly out of proportion to that I would expect from a simple ankle sprain. Therefore I will schedule an MRI of his left ankle to evaluate further. If there are signs of tendon injury, I will refer the patient to an orthopedic surgeon. I declined refilling the hydrocodone. Instead I ordered the patient a Cam Walker and instructed him to wear this at all times aside from sleeping.  I will await the results of the MRI

## 2014-07-16 ENCOUNTER — Other Ambulatory Visit: Payer: Self-pay | Admitting: Physician Assistant

## 2014-07-16 NOTE — Telephone Encounter (Signed)
Medication refilled per protocol. 

## 2014-07-20 ENCOUNTER — Emergency Department (HOSPITAL_COMMUNITY)
Admission: EM | Admit: 2014-07-20 | Discharge: 2014-07-20 | Discharge status: Against Medical Advice | Payer: Medicare Other | Attending: Emergency Medicine | Admitting: Emergency Medicine

## 2014-07-20 ENCOUNTER — Encounter (HOSPITAL_COMMUNITY): Payer: Self-pay | Admitting: *Deleted

## 2014-07-20 ENCOUNTER — Emergency Department (HOSPITAL_COMMUNITY): Payer: Medicare Other

## 2014-07-20 DIAGNOSIS — R0602 Shortness of breath: Secondary | ICD-10-CM | POA: Diagnosis not present

## 2014-07-20 DIAGNOSIS — J45901 Unspecified asthma with (acute) exacerbation: Secondary | ICD-10-CM | POA: Diagnosis not present

## 2014-07-20 DIAGNOSIS — I10 Essential (primary) hypertension: Secondary | ICD-10-CM | POA: Insufficient documentation

## 2014-07-20 DIAGNOSIS — Z720 Tobacco use: Secondary | ICD-10-CM | POA: Insufficient documentation

## 2014-07-20 DIAGNOSIS — R079 Chest pain, unspecified: Secondary | ICD-10-CM | POA: Insufficient documentation

## 2014-07-20 LAB — CBC
HCT: 44.7 % (ref 39.0–52.0)
Hemoglobin: 14.7 g/dL (ref 13.0–17.0)
MCH: 27.1 pg (ref 26.0–34.0)
MCHC: 32.9 g/dL (ref 30.0–36.0)
MCV: 82.5 fL (ref 78.0–100.0)
Platelets: 233 10*3/uL (ref 150–400)
RBC: 5.42 MIL/uL (ref 4.22–5.81)
RDW: 16.5 % — ABNORMAL HIGH (ref 11.5–15.5)
WBC: 6.4 10*3/uL (ref 4.0–10.5)

## 2014-07-20 LAB — BASIC METABOLIC PANEL
Anion gap: 10 (ref 5–15)
BUN: 11 mg/dL (ref 6–23)
CO2: 22 mmol/L (ref 19–32)
Calcium: 8.9 mg/dL (ref 8.4–10.5)
Chloride: 105 mmol/L (ref 96–112)
Creatinine, Ser: 1.03 mg/dL (ref 0.50–1.35)
GFR calc Af Amer: >90 mL/min (ref >90)
GFR calc non Af Amer: 81 mL/min — ABNORMAL LOW (ref >90)
Glucose, Bld: 154 mg/dL — ABNORMAL HIGH (ref 70–99)
Potassium: 3.5 mmol/L (ref 3.5–5.1)
Sodium: 137 mmol/L (ref 135–145)

## 2014-07-20 LAB — BRAIN NATRIURETIC PEPTIDE: B Natriuretic Peptide: 7.1 pg/mL (ref 0.0–100.0)

## 2014-07-20 LAB — I-STAT TROPONIN, ED: Troponin i, poc: 0 ng/mL (ref 0.00–0.08)

## 2014-07-20 NOTE — ED Notes (Signed)
Pt reports mid sharp chest pains since last weed. Having mild sob, pain gets worse when eating. Denies recent cough.

## 2014-07-20 NOTE — ED Notes (Signed)
Called pt to reassess vital signs with no answer.

## 2014-07-20 NOTE — ED Notes (Signed)
Pt's named called x2. No response.

## 2014-07-21 ENCOUNTER — Ambulatory Visit (INDEPENDENT_AMBULATORY_CARE_PROVIDER_SITE_OTHER): Payer: Medicare Other | Admitting: Family Medicine

## 2014-07-21 ENCOUNTER — Encounter: Payer: Self-pay | Admitting: Family Medicine

## 2014-07-21 VITALS — BP 100/68 | HR 88 | Temp 98.2°F | Resp 24 | Ht 66.0 in | Wt 322.0 lb

## 2014-07-21 DIAGNOSIS — R1310 Dysphagia, unspecified: Secondary | ICD-10-CM

## 2014-07-21 DIAGNOSIS — R1013 Epigastric pain: Secondary | ICD-10-CM

## 2014-07-21 LAB — COMPLETE METABOLIC PANEL WITH GFR
ALT: 22 U/L (ref 0–53)
AST: 18 U/L (ref 0–37)
Albumin: 3.8 g/dL (ref 3.5–5.2)
Alkaline Phosphatase: 53 U/L (ref 39–117)
BUN: 11 mg/dL (ref 6–23)
CO2: 26 mEq/L (ref 19–32)
Calcium: 9.3 mg/dL (ref 8.4–10.5)
Chloride: 103 mEq/L (ref 96–112)
Creat: 0.97 mg/dL (ref 0.50–1.35)
GFR, Est African American: >89 mL/min
GFR, Est Non African American: 88 mL/min
Glucose, Bld: 94 mg/dL (ref 70–99)
Potassium: 4.5 mEq/L (ref 3.5–5.3)
Sodium: 140 mEq/L (ref 135–145)
Total Bilirubin: 0.3 mg/dL (ref 0.2–1.2)
Total Protein: 7.9 g/dL (ref 6.0–8.3)

## 2014-07-21 LAB — CBC WITH DIFFERENTIAL/PLATELET
Basophils Absolute: 0 10*3/uL (ref 0.0–0.1)
Basophils Relative: 0 % (ref 0–1)
Eosinophils Absolute: 0.2 10*3/uL (ref 0.0–0.7)
Eosinophils Relative: 3 % (ref 0–5)
HCT: 45.5 % (ref 39.0–52.0)
Hemoglobin: 14.4 g/dL (ref 13.0–17.0)
Lymphocytes Relative: 43 % (ref 12–46)
Lymphs Abs: 3.2 10*3/uL (ref 0.7–4.0)
MCH: 26.1 pg (ref 26.0–34.0)
MCHC: 31.6 g/dL (ref 30.0–36.0)
MCV: 82.6 fL (ref 78.0–100.0)
MPV: 10.8 fL (ref 8.6–12.4)
Monocytes Absolute: 0.7 10*3/uL (ref 0.1–1.0)
Monocytes Relative: 9 % (ref 3–12)
Neutro Abs: 3.4 10*3/uL (ref 1.7–7.7)
Neutrophils Relative %: 45 % (ref 43–77)
Platelets: 279 10*3/uL (ref 150–400)
RBC: 5.51 MIL/uL (ref 4.22–5.81)
RDW: 16.1 % — ABNORMAL HIGH (ref 11.5–15.5)
WBC: 7.5 10*3/uL (ref 4.0–10.5)

## 2014-07-21 LAB — LIPASE: Lipase: 16 U/L (ref 0–75)

## 2014-07-21 NOTE — Progress Notes (Signed)
Subjective:    Patient ID: Kenneth [name], male    DOB: 08-18-1960, 54 y.o.   MRN: [id number]  HPI Patient symptoms began approximately 1 week ago.  Patient reports substernal discomfort and burning. He reports dysphagia to solids and liquids. Solid food seems like it's sticking at the distal end of his esophagus. He is unable to swallow meat. He is still able to swallow liquids. He also reports heartburn and indigestion. He has been taking 2 and 3 Prevacid is a day which helps slightly. He denies melena or hematochezia. He is no longer taking NSAIDs. However he is consuming a substantial amount of alcohol. Today on examination he is tender to palpation in the epigastric area. However the abdomen is soft nondistended with normal bowel sounds. He denies any angina. Last night the symptoms were so bad he went to the emergency room but he did not stay. However in the emergency room they obtain an EKG which showed no significant changes compared to EKG from February. He did have stable chronic T-wave inversions in lead 1 and aVL. There are also obtain a chest x-ray which was normal. Point-of-care troponin was also 0.. Past Medical History  Diagnosis Date  . Hypercholesteremia   . Hypertension   . Chronic bronchitis   . Asthma   . GERD (gastroesophageal reflux disease)   . Arthritis    Past Surgical History  Procedure Laterality Date  . Finger amputation    . Esophagogastroduodenoscopy (egd) with propofol N/A 12/11/2012    Procedure: ESOPHAGOGASTRODUODENOSCOPY (EGD) WITH PROPOFOL;  Surgeon: Arta Silence, MD;  Location: [facility];  Service: Endoscopy;  Laterality: N/A;   Current Outpatient Prescriptions on File Prior to Visit  Medication Sig Dispense Refill  . aspirin 81 MG tablet Take 81 mg by mouth daily.    . diclofenac (VOLTAREN) 75 MG EC tablet TAKE 1 TABLET BY MOUTH 2 TIMES DAILY. 60 tablet 3  . docusate sodium (COLACE) 100 MG capsule Take 100 mg by mouth 2 (two) times daily.    .  furosemide (LASIX) 20 MG tablet TAKE 1 TABLET BY MOUTH DAILY. 30 tablet 5  . HYDROcodone-acetaminophen (NORCO/VICODIN) 5-325 MG per tablet Take 1 tablet by mouth every 6 (six) hours as needed for moderate pain or severe pain. 30 tablet 0  . hydrocortisone (PROCTOSOL HC) 2.5 % rectal cream Place 1 application rectally 2 (two) times daily.    . lansoprazole (PREVACID) 30 MG capsule TAKE 1 CAPSULE BY MOUTH DAILY AT 12:00 NOON. 90 capsule 1  . nitroGLYCERIN (NITROSTAT) 0.4 MG SL tablet Place 0.4 mg under the tongue every 5 (five) minutes as needed. For chest pain    . PROAIR HFA 108 (90 BASE) MCG/ACT inhaler INHALE 2 PUFFS INTO THE LUNGS EVERY 4 HOURS AS NEEDED FOR SHORTNESS OF BREATH 8.5 g 2   Current Facility-Administered Medications on File Prior to Visit  Medication Dose Route Frequency Provider Last Rate Last Dose  . lactated ringers infusion    Continuous PRN Chyrel Masson, CRNA       No Known Allergies History   Social History  . Marital Status: Divorced    Spouse Name: N/A  . Number of Children: 0  . Years of Education: N/A   Occupational History  . Not on file.   Social History Main Topics  . Smoking status: Current Every Day Smoker -- 1.00 packs/day for 40 years    Types: Cigarettes  . Smokeless tobacco: Not on file  . Alcohol Use: 0.0  oz/week    0 Standard drinks or equivalent per week     Comment: some  . Drug Use: Yes    Special: Cocaine     Comment: patient states none- read lab report 09/02/2012/ 11-20-12 case cx.due to positive screen test-pt. advised no recreational drugs to be used  . Sexual Activity: Not on file   Other Topics Concern  . Not on file   Social History Narrative   Lives with brother and his sister in [place].        Review of Systems  All other systems reviewed and are negative.      Objective:   Physical Exam  Constitutional: He appears well-developed.  Neck: Neck supple.  Cardiovascular: Normal rate, regular rhythm and normal heart  sounds.   No murmur heard. Pulmonary/Chest: Effort normal and breath sounds normal.  Abdominal: Soft. Bowel sounds are normal. He exhibits no distension and no mass. There is tenderness. There is no rebound and no guarding.  Vitals reviewed.         Assessment & Plan:  Abdominal pain, epigastric - Plan: CBC with Differential/Platelet, COMPLETE METABOLIC PANEL WITH GFR, Lipase, Ambulatory referral to Gastroenterology  Dysphagia - Plan: Ambulatory referral to Gastroenterology  I believe the patient likely has an esophageal stricture although an ulcer as well as pancreatitis are also on the differential diagnosis. I asked the patient to discontinue Prevacid. I will start him on dexilant 60 mg by mouth every morning. I will set him up to see GI as soon as possible because I believe he needs an EGD and likely an esophageal dilation. I recommended a bland diet. I recommended complete avoidance of alcohol and NSAIDs. I will also check a lipase to rule out pancreatitis.

## 2014-07-22 ENCOUNTER — Encounter: Payer: Self-pay | Admitting: Neurology

## 2014-07-23 ENCOUNTER — Ambulatory Visit
Admission: RE | Admit: 2014-07-23 | Discharge: 2014-07-23 | Disposition: A | Discharge status: Home / Self Care | Payer: Medicare Other | Source: Ambulatory Visit | Attending: Family Medicine | Admitting: Family Medicine

## 2014-07-23 ENCOUNTER — Other Ambulatory Visit: Payer: Self-pay | Admitting: Family Medicine

## 2014-07-23 DIAGNOSIS — M67972 Unspecified disorder of synovium and tendon, left ankle and foot: Secondary | ICD-10-CM

## 2014-07-23 DIAGNOSIS — R600 Localized edema: Secondary | ICD-10-CM | POA: Diagnosis not present

## 2014-07-23 DIAGNOSIS — M25572 Pain in left ankle and joints of left foot: Secondary | ICD-10-CM | POA: Diagnosis not present

## 2014-07-24 ENCOUNTER — Telehealth: Payer: Self-pay | Admitting: Family Medicine

## 2014-07-24 DIAGNOSIS — M25572 Pain in left ankle and joints of left foot: Secondary | ICD-10-CM

## 2014-07-24 NOTE — Telephone Encounter (Signed)
-----   Message from [name], MD sent at 07/24/2014  7:03 AM EDT ----- Mri was nml of ankle.  No cause for pain found, if still in pain, suggest ortho consult.

## 2014-07-24 NOTE — Telephone Encounter (Signed)
Pt aware of MRI results and provider recommendations.  Pt does want ortho referral, referral initiated

## 2014-07-27 ENCOUNTER — Other Ambulatory Visit: Payer: Self-pay | Admitting: Physician Assistant

## 2014-07-27 DIAGNOSIS — R1310 Dysphagia, unspecified: Secondary | ICD-10-CM | POA: Diagnosis not present

## 2014-07-27 DIAGNOSIS — K219 Gastro-esophageal reflux disease without esophagitis: Secondary | ICD-10-CM | POA: Diagnosis not present

## 2014-07-29 ENCOUNTER — Encounter (INDEPENDENT_AMBULATORY_CARE_PROVIDER_SITE_OTHER): Payer: Self-pay

## 2014-07-29 ENCOUNTER — Ambulatory Visit
Admission: RE | Admit: 2014-07-29 | Discharge: 2014-07-29 | Disposition: A | Discharge status: Home / Self Care | Payer: Medicare Other | Source: Ambulatory Visit | Attending: Physician Assistant | Admitting: Physician Assistant

## 2014-07-29 ENCOUNTER — Ambulatory Visit: Payer: Medicare Other | Admitting: Neurology

## 2014-07-29 DIAGNOSIS — K219 Gastro-esophageal reflux disease without esophagitis: Secondary | ICD-10-CM | POA: Diagnosis not present

## 2014-07-29 DIAGNOSIS — R1310 Dysphagia, unspecified: Secondary | ICD-10-CM

## 2014-07-30 ENCOUNTER — Encounter: Payer: Self-pay | Admitting: Neurology

## 2014-07-30 ENCOUNTER — Ambulatory Visit (INDEPENDENT_AMBULATORY_CARE_PROVIDER_SITE_OTHER): Payer: Medicare Other | Admitting: Neurology

## 2014-07-30 VITALS — BP 152/82 | HR 92 | Resp 24 | Ht 66.0 in | Wt 310.0 lb

## 2014-07-30 DIAGNOSIS — R001 Bradycardia, unspecified: Secondary | ICD-10-CM | POA: Diagnosis not present

## 2014-07-30 DIAGNOSIS — K08409 Partial loss of teeth, unspecified cause, unspecified class: Secondary | ICD-10-CM | POA: Diagnosis not present

## 2014-07-30 DIAGNOSIS — G4733 Obstructive sleep apnea (adult) (pediatric): Secondary | ICD-10-CM | POA: Diagnosis not present

## 2014-07-30 DIAGNOSIS — G4734 Idiopathic sleep related nonobstructive alveolar hypoventilation: Secondary | ICD-10-CM

## 2014-07-30 DIAGNOSIS — Z7729 Contact with and (suspected ) exposure to other hazardous substances: Secondary | ICD-10-CM

## 2014-07-30 DIAGNOSIS — Z7722 Contact with and (suspected) exposure to environmental tobacco smoke (acute) (chronic): Secondary | ICD-10-CM

## 2014-07-30 NOTE — Patient Instructions (Addendum)
We will bring you back for a full night CPAP or BiPAP titration study to optimize your treatment and find a level of pressure for you that is successful in treating your severe obstructive sleep apnea. In the meantime, continue using your Auto biPap machine. Please do not skip any nights. Please STOP SMOKING! Our sleep lab administrative assistant, Angelina Sheriff will call you to schedule your sleep study. If you don't hear back from her by next week please feel free to call her at [phone]. This is her direct line and please leave a message with your phone number to call back if you get the voicemail box.

## 2014-07-30 NOTE — Progress Notes (Signed)
Subjective:    Patient ID: Kenneth [name] is a 54 y.o. male.  HPI     Interim history:   Kenneth [name] is a 54 year old right-handed gentleman with an underlying medical history of hyperlipidemia, coronary artery disease, reflux disease, morbid obesity, arthritis, chronic bronchitis in the setting of smoking, obesity hypoventilation syndrome and recent syncopal event, who presents for follow-up consultation of his obstructive sleep apnea, after his recent split-night sleep study. The patient is accompanied by his niece today. I first met him on 05/22/2014 at the request of his primary care physician, at which time the patient reported snoring and daytime somnolence. I suggested he return for sleep study. He had a split-night sleep study on 05/25/2014 and went over his test results with him in detail today. Baseline sleep efficiency was 77.3% with a latency to sleep of 0 minutes and wake after sleep onset of 35.5 minutes with severe sleep fragmentation noted. He had an elevated arousal index. He had absence of slow-wave sleep and absence of REM sleep prior to CPAP. He had had rare pauses on his EKG, about 3 seconds long. He had moderate to loud snoring. His total AHI was 119 per hour. This line oxygen saturation was 89%, nadir was 76%. He was then titrated on CPAP from 5-15 cm and then placed on BiPAP therapy and titrated from 16/12 cm to 24/20 cm. He did not have resolution of his sleep disordered breathing while on CPAP or BiPAP standard. I suggested Auto BiPAP at home and also suggested he undergo pulse oximetry testing while on BiPAP therapy at home.   Today, 07/30/2014: I reviewed his BiPAP compliance data from 06/28/2014 through 07/27/2014 which is a total of 30 days during which time he used his machine only 22 days. Percent used days greater than 4 hours was 70%, indicating borderline/adequate compliance. Average usage for all nights was 4 hours and 54 minutes. He is on BiPAP with maximum IPAP of 25  cm, minimum EPAP of 14 cm and pressure support of 4. Average AHI was 3.3 per hour, adequate. Leak was acceptable with the 95th percentile at 13.1 L/m. 95th percentile of pressure appears to be 21/17.  Today, 07/30/2014: He reports feeling better with regards to his sleep. He has less daytime somnolence and feels better rested. Of note, he had recent tooth extractions and was therefore not able to use his CPAP at those times. Otherwise, he has been compliant. He still smokes, but has cut back from 2 ppd to perhaps 1/2 ppd. He drinks 2 sodas per day.    Previously:  I spoke with his cardiologist on the phone on 05/19/14; the patient was having some 5-6 s pauses. His Holter monitor was placed earlier this month. On 04/30/2014 he had a syncopal spell. He was in the emergency room for this. He has seen Dr. Percival Spanish for cardiac workup. He has been advised to have a sleep study. He snores, he wakes up not refreshed and he has multiple nighttime awakenings and nocturia at least 4-5 times per night. His Epworth sleepiness score is 19 out of 24. He goes to bed around 9 PM and while he falls asleep fairly quickly he wakes up almost every hour he says. Sometimes he gets out of bed around 3 AM and never goes back to sleep. He denies restless leg symptoms but has some aching in his legs. He has lower extremity swelling. He wakes up with a headache at times. He is not aware of any family  history of obstructive sleep apnea. He has never had a sleep study. He has been overweight for years. He snores and this is at times quite loud. [name] not sure if he actually quits breathing in his sleep but has woken up with a sense of gasping for air. He drinks about 2 Coca-Cola as per day. He does not drink coffee. He occasionally drinks alcohol. He smokes one pack per day.   He lives with his brother and his sister-in-law. He is divorced. He does not have any children. There are no pets in his bed. Occasionally he watches TV in bed. He  does not work.   His Past Medical History Is Significant For: Past Medical History  Diagnosis Date  . Hypercholesteremia   . Hypertension   . Chronic bronchitis   . Asthma   . GERD (gastroesophageal reflux disease)   . Arthritis     His Past Surgical History Is Significant For: Past Surgical History  Procedure Laterality Date  . Finger amputation    . Esophagogastroduodenoscopy (egd) with propofol N/A 12/11/2012    Procedure: ESOPHAGOGASTRODUODENOSCOPY (EGD) WITH PROPOFOL;  Surgeon: Arta Silence, MD;  Location: [facility];  Service: Endoscopy;  Laterality: N/A;    His Family History Is Significant For: Family History  Problem Relation Age of Onset  . Hypertension Father   . Diabetes      Multiple family members  . Diabetes Father   . Diabetes Sister   . Diabetes Brother   . Stroke Sister     His Social History Is Significant For: History   Social History  . Marital Status: Divorced    Spouse Name: N/A  . Number of Children: 0  . Years of Education: N/A   Social History Main Topics  . Smoking status: Current Every Day Smoker -- 1.00 packs/day for 40 years    Types: Cigarettes  . Smokeless tobacco: Not on file  . Alcohol Use: 0.0 oz/week    0 Standard drinks or equivalent per week     Comment: some  . Drug Use: Yes    Special: Cocaine     Comment: patient states none- read lab report 09/02/2012/ 11-20-12 case cx.due to positive screen test-pt. advised no recreational drugs to be used  . Sexual Activity: Not on file   Other Topics Concern  . None   Social History Narrative   Lives with brother and his sister in [place].      His Allergies Are:  No Known Allergies:   His Current Medications Are:  Outpatient Encounter Prescriptions as of 07/30/2014  Medication Sig  . aspirin 81 MG tablet Take 81 mg by mouth daily.  . diclofenac (VOLTAREN) 75 MG EC tablet TAKE 1 TABLET BY MOUTH 2 TIMES DAILY.  Marland Kitchen docusate sodium (COLACE) 100 MG capsule Take 100 mg by mouth 2  (two) times daily.  . furosemide (LASIX) 20 MG tablet TAKE 1 TABLET BY MOUTH DAILY.  Marland Kitchen HYDROcodone-acetaminophen (NORCO/VICODIN) 5-325 MG per tablet Take 1 tablet by mouth every 6 (six) hours as needed for moderate pain or severe pain.  . hydrocortisone (PROCTOSOL HC) 2.5 % rectal cream Place 1 application rectally 2 (two) times daily.  . lansoprazole (PREVACID) 30 MG capsule TAKE 1 CAPSULE BY MOUTH DAILY AT 12:00 NOON.  . nitroGLYCERIN (NITROSTAT) 0.4 MG SL tablet Place 0.4 mg under the tongue every 5 (five) minutes as needed. For chest pain  . PROAIR HFA 108 (90 BASE) MCG/ACT inhaler INHALE 2 PUFFS INTO THE LUNGS EVERY  4 HOURS AS NEEDED FOR SHORTNESS OF BREATH   Facility-Administered Encounter Medications as of 07/30/2014  Medication  . lactated ringers infusion  :  Review of Systems:  Out of a complete 14 point review of systems, all are reviewed and negative with the exception of these symptoms as listed below:   Review of Systems  All other systems reviewed and are negative.   Objective:  Neurologic Exam  Physical Exam Physical Examination:   Filed Vitals:   07/30/14 1047  BP: 152/82  Pulse: 92  Resp: 24    General Examination: The patient is a very pleasant 54 y.o. male in no acute distress. He is morbidly obese. He is adequately groomed.   HEENT: Normocephalic, atraumatic, pupils are equal, round and reactive to light and accommodation. Funduscopic exam is normal with sharp disc margins noted. Conjunctivae are dry and irritated appearing. Extraocular tracking is good without limitation to gaze excursion or nystagmus noted. Normal smooth pursuit is noted. Hearing is grossly intact. Face is symmetric with normal facial animation and normal facial sensation. Speech is clear with no dysarthria noted. There is no hypophonia. There is no lip, neck/head, jaw or voice tremor. Neck is supple with full range of passive and active motion. There are no carotid bruits on auscultation.  Oropharynx exam reveals: moderate mouth dryness, adequate dental hygiene with s/p tooth extractions in the b/l lower jaw and L upper jaw, and marked airway crowding, due to large tongue, redundant and thick soft palate, narrow airway entry, and tonsils were not visualized. Mallampati is class III. Tongue protrudes centrally and palate elevates symmetrically.    Chest: Clear to auscultation without wheezing, rhonchi or crackles noted.  Heart: S1+S2+0, regular and normal without murmurs, rubs or gallops noted.   Abdomen: Soft, non-tender and non-distended with normal bowel sounds appreciated on auscultation.  Extremities: There is 1+ pitting edema in the distal lower extremities bilaterally, left worse than right, but overall improvement noted. Chronic discoloration and stasis-like changes are seen. Pedal pulses are intact.  Skin: Warm and dry without trophic changes noted. There are no varicose veins.  Musculoskeletal: exam reveals no obvious joint deformities, tenderness or joint swelling or erythema, with the exception of pain and decreased range of motion of his left leg. He said he injured his left leg in the past.   Neurologically:  Mental status: The patient is awake, alert and oriented in all 4 spheres. His immediate and remote memory, attention, language skills and fund of knowledge are appropriate. There is no evidence of aphasia, agnosia, apraxia or anomia. Speech is clear with normal prosody and enunciation. Thought process is linear. Mood is normal and affect is normal.  Cranial nerves II - XII are as described above under HEENT exam. In addition: shoulder shrug is normal with equal shoulder height noted. Motor exam: Normal bulk, strength and tone is noted. There is no drift, tremor or rebound. Romberg is negative. Reflexes are 2+ throughout. Babinski: Toes are flexor bilaterally. Fine motor skills and coordination: intact with normal finger taps, normal hand movements, normal rapid  alternating patting, normal foot taps and normal foot agility.  Cerebellar testing: No dysmetria or intention tremor on finger to nose testing. Heel to shin is difficult for him due to body habitus. There is no truncal or gait ataxia.  Sensory exam: intact to light touch, pinprick, vibration, temperature sense in the upper and lower extremities.  Gait, station and balance: He stands with difficulty. No veering to one side is noted. No  leaning to one side is noted. Posture is age-appropriate and stance is wide-based, likely secondary to body habitus. He walks slowly.            Assessment and Plan:   In summary, Kenneth [name] is a very pleasant 54 year old male with an underlying medical history of hyperlipidemia, coronary artery disease, reflux disease, morbid obesity, arthritis, chronic bronchitis in the setting of smoking, obesity hypoventilation syndrome and recent syncopal event, followed by cardiology, who presents for follow-up consultation of his severe obstructive sleep apnea with evidence of significant nocturnal desaturations and nocturnal hypoxemia, again, in the context of ongoing smoking.  I had a long chat with the patient and his niece about his sleep study findings and the diagnosis of OSA, its prognosis and treatment options. We talked about medical treatments, surgical interventions and non-pharmacological approaches. I explained in particular the risks and ramifications of untreated moderate to severe OSA, especially with respect to developing cardiovascular disease down the Road, including congestive heart failure, difficult to treat hypertension, cardiac arrhythmias, or stroke. Even type 2 diabetes has, in part, been linked to untreated OSA. Symptoms of untreated OSA include daytime sleepiness, memory problems, mood irritability and mood disorder such as depression and anxiety, lack of energy, as well as recurrent headaches, especially morning headaches. We talked about smoking cessation  and trying to maintain a healthy lifestyle in general, as well as the importance of weight control. I encouraged the patient to eat healthy, exercise daily and keep well hydrated, to keep a scheduled bedtime and wake time routine, to not skip any meals and eat healthy snacks in between meals. I advised the patient to stop smoking.  We reviewed his PAP compliance data in detail. He is commended for being compliant with treatment but at the same time also advised not to skip any nights. He had recent tooth extraction and was not able to use his CPAP at the time. I commended him for using the autobiPAP.  He endorses improved sleep. I would like for him to return for full night PAP  Titration study to improve his treatment settings and monitor his oxygen saturations.  He is advised that generally speaking, smokers can have lower average oxygen saturation can sleep even in the absence of obstructive sleep disordered breathing.  His physical exam is stable. In fact, his lower extremity edema appears to be a little less. I ordered CPAP titration study with the possibility of treating him with BiPAP. I will see him back afterwards. I answered all his questions today and the patient was in agreement. I would like to see him back after the sleep study is completed and encouraged him to call with any interim questions, concerns, problems or updates.  I spent 25 minutes in total face-to-face time with the patient, more than 50% of which was spent in counseling and coordination of care, reviewing test results, reviewing medication and discussing or reviewing the diagnosis of OSA, its prognosis and treatment options.

## 2014-08-04 ENCOUNTER — Ambulatory Visit: Payer: Self-pay | Admitting: Family Medicine

## 2014-08-04 ENCOUNTER — Ambulatory Visit (INDEPENDENT_AMBULATORY_CARE_PROVIDER_SITE_OTHER): Payer: Medicare Other | Admitting: Neurology

## 2014-08-04 DIAGNOSIS — G4733 Obstructive sleep apnea (adult) (pediatric): Secondary | ICD-10-CM

## 2014-08-04 DIAGNOSIS — G479 Sleep disorder, unspecified: Secondary | ICD-10-CM

## 2014-08-04 NOTE — Sleep Study (Signed)
Please see the scanned sleep study interpretation located in the Procedure tab within the Chart Review section. 

## 2014-08-10 DIAGNOSIS — S93412A Sprain of calcaneofibular ligament of left ankle, initial encounter: Secondary | ICD-10-CM | POA: Diagnosis not present

## 2014-08-10 DIAGNOSIS — M7672 Peroneal tendinitis, left leg: Secondary | ICD-10-CM | POA: Diagnosis not present

## 2014-08-14 ENCOUNTER — Telehealth: Payer: Self-pay | Admitting: Neurology

## 2014-08-14 NOTE — Telephone Encounter (Signed)
I have also faxed over PSG and orders to [facility]. Colletta Maryland from [employer] me requesting this information.

## 2014-08-14 NOTE — Telephone Encounter (Signed)
Left message to call back  

## 2014-08-14 NOTE — Telephone Encounter (Signed)
Patient seen on 07/30/14 and recent sleep study on 08/04/14:  Diana: please call patient:  The latest sleep study with full night treatment unfortunately did not give [name] better results compared to February 2016. What we did confirm was that he does better with BiPAP. Therefore, I recommend, that he continue with autoBiPAP for now.  We will go ahead and do an overnight pulse oximetry test. His DME should provide him with an O2 monitor for one night and we will check his oxygen values, while he is asleep with his BiPAP on.  Please notify DME - [facility] - that there is an order for overnight pulse ox from March. They should have the order. I do not see that they actually did the ONO? If not, please ask them to contact pt for ONO while on autoBiPAP and fax results to [name].  Please route report for sleep study to Dr. Dennard Schaumann and [facility].  Pt needs FU with me in 2-3 months.  You can close this encounter, when done. Thanks,  Star Age, MD, PhD [facility])

## 2014-08-14 NOTE — Telephone Encounter (Signed)
Faxed sleep study to PCP.

## 2014-08-17 ENCOUNTER — Encounter: Payer: Self-pay | Admitting: Family Medicine

## 2014-08-17 DIAGNOSIS — G4733 Obstructive sleep apnea (adult) (pediatric): Secondary | ICD-10-CM | POA: Insufficient documentation

## 2014-08-20 ENCOUNTER — Encounter: Payer: Self-pay | Admitting: Neurology

## 2014-08-21 DIAGNOSIS — G4733 Obstructive sleep apnea (adult) (pediatric): Secondary | ICD-10-CM | POA: Diagnosis not present

## 2014-08-21 NOTE — Telephone Encounter (Signed)
Left message that we have sent orders to [facility]. STated that he can call us with any further questions or [facility]. I left both contact numbers.

## 2014-08-31 ENCOUNTER — Encounter: Payer: Self-pay | Admitting: Neurology

## 2014-09-03 ENCOUNTER — Telehealth: Payer: Self-pay

## 2014-09-03 NOTE — Telephone Encounter (Signed)
[facility] states that patient will need a repeat sleep study with and without O2, for patient to qualify for home O2. Dr. Rexene Alberts is aware. I left a message for the patient to call us back and let us know if he wants to repeat the study.

## 2014-09-04 ENCOUNTER — Other Ambulatory Visit: Payer: Self-pay | Admitting: Neurology

## 2014-09-04 DIAGNOSIS — G4733 Obstructive sleep apnea (adult) (pediatric): Secondary | ICD-10-CM

## 2014-09-04 DIAGNOSIS — G4734 Idiopathic sleep related nonobstructive alveolar hypoventilation: Secondary | ICD-10-CM

## 2014-09-04 NOTE — Progress Notes (Signed)
I'm requesting a repeat full night BiPAP titration study for this patient. We will start with room air and if optimal treatment settings are achieved to reduce his AHI, we will evaluate him for the need for supplemental oxygen while on optimal BiPAP therapy. He is currently on auto BiPAP. A recent pulse oximetry test from 08/20/2014 was reviewed: Time below 88% while on Pap therapy was 14 minutes during the study indicating suboptimal oxygen saturations while on room air and BiPAP therapy. Average oxygen saturation was 92.1%, nadir was 80%.

## 2014-09-21 ENCOUNTER — Telehealth: Payer: Self-pay | Admitting: *Deleted

## 2014-09-21 NOTE — Telephone Encounter (Signed)
Did not show for sleep study, spoke to male  ath home number. She said he was suppose to be here. She said she would call him and call me back, but did not.

## 2014-10-10 DIAGNOSIS — G4733 Obstructive sleep apnea (adult) (pediatric): Secondary | ICD-10-CM | POA: Diagnosis not present

## 2014-10-15 ENCOUNTER — Ambulatory Visit (INDEPENDENT_AMBULATORY_CARE_PROVIDER_SITE_OTHER): Payer: Medicare Other | Admitting: Neurology

## 2014-10-15 DIAGNOSIS — G4733 Obstructive sleep apnea (adult) (pediatric): Secondary | ICD-10-CM | POA: Diagnosis not present

## 2014-10-15 DIAGNOSIS — G479 Sleep disorder, unspecified: Secondary | ICD-10-CM

## 2014-10-15 DIAGNOSIS — G4761 Periodic limb movement disorder: Secondary | ICD-10-CM

## 2014-10-15 DIAGNOSIS — G4734 Idiopathic sleep related nonobstructive alveolar hypoventilation: Secondary | ICD-10-CM

## 2014-10-15 NOTE — Sleep Study (Signed)
Please see the scanned sleep study interpretation located in the Procedure tab within the Chart Review section. 

## 2014-10-19 ENCOUNTER — Telehealth: Payer: Self-pay | Admitting: Neurology

## 2014-10-19 DIAGNOSIS — G4733 Obstructive sleep apnea (adult) (pediatric): Secondary | ICD-10-CM

## 2014-10-19 NOTE — Telephone Encounter (Signed)
Patient last seen on 07/30/14, currently on autoBiPAP, should have DME.   Please call and inform patient that I have entered an order for treatment with PAP. He did well during the latest sleep study with BiPAP. We will, therefore, change his setting to a set BiPAP pressure. I will see the patient back in follow-up in about 8-10 weeks. Please also explain to the patient that I will be looking out for compliance data downloaded from the machine, which can be done remotely through a modem at times or stored on an SD card in the back of the machine. At the time of the followup appointment we will discuss sleep study results and how it is going with PAP treatment at home. Please advise patient to bring His machine at the time of the visit; at least for the first visit, even though this is cumbersome. Bringing the machine for every visit after that may not be needed, but often helps for the first visit. Please also make sure, the patient has a follow-up appointment with me in about 8-10 weeks from the setup date, thanks.   Star Age, MD, PhD [facility])

## 2014-10-20 NOTE — Telephone Encounter (Signed)
I called the house and sister answered. (She is aware of patient's care) Results were given and she stated that patient would like to start BiPAP. Orders are being faxed to [facility] and PCP.

## 2014-10-22 DIAGNOSIS — H1033 Unspecified acute conjunctivitis, bilateral: Secondary | ICD-10-CM | POA: Diagnosis not present

## 2014-10-28 DIAGNOSIS — H1033 Unspecified acute conjunctivitis, bilateral: Secondary | ICD-10-CM | POA: Diagnosis not present

## 2014-11-02 ENCOUNTER — Encounter: Payer: Self-pay | Admitting: Family Medicine

## 2014-11-10 DIAGNOSIS — G4733 Obstructive sleep apnea (adult) (pediatric): Secondary | ICD-10-CM | POA: Diagnosis not present

## 2014-12-11 DIAGNOSIS — G4733 Obstructive sleep apnea (adult) (pediatric): Secondary | ICD-10-CM | POA: Diagnosis not present

## 2014-12-21 DIAGNOSIS — G4733 Obstructive sleep apnea (adult) (pediatric): Secondary | ICD-10-CM | POA: Diagnosis not present

## 2015-01-10 DIAGNOSIS — G4733 Obstructive sleep apnea (adult) (pediatric): Secondary | ICD-10-CM | POA: Diagnosis not present

## 2015-02-10 DIAGNOSIS — G4733 Obstructive sleep apnea (adult) (pediatric): Secondary | ICD-10-CM | POA: Diagnosis not present

## 2015-03-01 ENCOUNTER — Telehealth: Payer: Self-pay | Admitting: *Deleted

## 2015-03-01 ENCOUNTER — Encounter: Payer: Self-pay | Admitting: Family Medicine

## 2015-03-01 ENCOUNTER — Ambulatory Visit (INDEPENDENT_AMBULATORY_CARE_PROVIDER_SITE_OTHER): Payer: Medicare Other | Admitting: Family Medicine

## 2015-03-01 ENCOUNTER — Ambulatory Visit: Payer: Medicare Other | Admitting: Family Medicine

## 2015-03-01 ENCOUNTER — Ambulatory Visit: Payer: Self-pay | Admitting: Family Medicine

## 2015-03-01 VITALS — BP 138/62 | HR 88 | Temp 98.0°F | Resp 18 | Ht 66.0 in | Wt 314.0 lb

## 2015-03-01 DIAGNOSIS — M1000 Idiopathic gout, unspecified site: Secondary | ICD-10-CM | POA: Diagnosis not present

## 2015-03-01 DIAGNOSIS — M109 Gout, unspecified: Secondary | ICD-10-CM

## 2015-03-01 MED ORDER — OXYCODONE-ACETAMINOPHEN 5-325 MG PO TABS: 1.0000 | ORAL_TABLET | Freq: Three times a day (TID) | ORAL | Status: DC | PRN

## 2015-03-01 MED ORDER — FUROSEMIDE 20 MG PO TABS: 20.0000 mg | ORAL_TABLET | Freq: Every day | ORAL | Status: DC

## 2015-03-01 MED ORDER — INDOMETHACIN 25 MG PO CAPS: 25.0000 mg | ORAL_CAPSULE | Freq: Three times a day (TID) | ORAL | Status: DC

## 2015-03-01 MED ORDER — NITROGLYCERIN 0.4 MG SL SUBL: 0.4000 mg | SUBLINGUAL_TABLET | SUBLINGUAL | Status: AC | PRN

## 2015-03-01 NOTE — Patient Instructions (Signed)
Take indocin three times a day for inflammation Try soaking Epson salt Take pain pill as needed F/U as needed

## 2015-03-01 NOTE — Telephone Encounter (Signed)
Received request from pharmacy for PA on Indocin.   PA submitted.   Dx: M10.00

## 2015-03-01 NOTE — Progress Notes (Signed)
Patient ID: Kenneth [name], male   DOB: [birth year], 54 y.o.   MRN: [id number]   Subjective:    Patient ID: Kenneth [name], male    DOB: 07/07/1960, 54 y.o.   MRN: [id number]  Patient presents for Joint Pain  patient here with right great toe pain for the past week. No history of any previous injuries to the foot or previous pain. He states this started swelling up near the top part of the toe. It is painful if anything touches it. He has not had any drainage from the toe. No known injury.    Review Of Systems:  GEN- denies fatigue, fever, weight loss,weakness, recent illness HEENT- denies eye drainage, change in vision, nasal discharge, CVS- denies chest pain, palpitations RESP- denies SOB, cough, wheeze MSK- +joint pain, muscle aches, injury       Objective:    BP 138/62 mmHg  Pulse 88  Temp(Src) 98 F (36.7 C) (Oral)  Resp 18  Ht 5\' 6"  (1.676 m)  Wt 314 lb (142.429 kg)  BMI 50.70 kg/m2 GEN- NAD, alert and oriented x3 MSK-Right foot- swelling at DIP of Great toe- TTP , +swelling decreased flexion, mild TTP at PIP, Good ROM ankle, Left foot- FROM toes/ankle Skin- mild erythema, no fluctuant areas       Assessment & Plan:      Problem List Items Addressed This Visit    None    Visit Diagnoses    Podagra    -  Primary    Check Uric acid level, start Indocin TID and percocet given, Epson salt soak, doubt true infection    Relevant Orders    Uric Acid    CBC       Note: This dictation was prepared with Dragon dictation along with smaller phrase technology. Any transcriptional errors that result from this process are unintentional.

## 2015-03-02 LAB — CBC
HCT: 46 % (ref 39.0–52.0)
Hemoglobin: 14.9 g/dL (ref 13.0–17.0)
MCH: 27.2 pg (ref 26.0–34.0)
MCHC: 32.4 g/dL (ref 30.0–36.0)
MCV: 83.9 fL (ref 78.0–100.0)
MPV: 10.7 fL (ref 8.6–12.4)
Platelets: 273 10*3/uL (ref 150–400)
RBC: 5.48 MIL/uL (ref 4.22–5.81)
RDW: 15.6 % — ABNORMAL HIGH (ref 11.5–15.5)
WBC: 6.6 10*3/uL (ref 4.0–10.5)

## 2015-03-02 LAB — URIC ACID: Uric Acid, Serum: 7.4 mg/dL (ref 4.0–7.8)

## 2015-03-03 MED ORDER — METHYLPREDNISOLONE 4 MG PO TABS: ORAL_TABLET | ORAL | Status: DC

## 2015-03-03 NOTE — Telephone Encounter (Signed)
Prescription sent to pharmacy.   Call placed to patient and patient sister Stanton Kidney made aware.

## 2015-03-03 NOTE — Telephone Encounter (Signed)
Send in Medrol dosepak 

## 2015-03-03 NOTE — Telephone Encounter (Signed)
Received PA determination.   PA denied.   MD please advise.  

## 2015-03-08 ENCOUNTER — Ambulatory Visit (INDEPENDENT_AMBULATORY_CARE_PROVIDER_SITE_OTHER): Payer: Medicare Other | Admitting: Family Medicine

## 2015-03-08 ENCOUNTER — Encounter: Payer: Self-pay | Admitting: Family Medicine

## 2015-03-08 VITALS — BP 130/78 | HR 92 | Temp 98.4°F | Resp 22 | Wt 322.0 lb

## 2015-03-08 DIAGNOSIS — M1000 Idiopathic gout, unspecified site: Secondary | ICD-10-CM | POA: Diagnosis not present

## 2015-03-08 DIAGNOSIS — M109 Gout, unspecified: Secondary | ICD-10-CM

## 2015-03-08 MED ORDER — METHYLPREDNISOLONE ACETATE 80 MG/ML IJ SUSP
80.0000 mg | Freq: Once | INTRAMUSCULAR | Status: AC
  Administered 2015-03-08: 80 mg via INTRAMUSCULAR

## 2015-03-08 MED ORDER — HYDROCODONE-ACETAMINOPHEN 5-325 MG PO TABS: 1.0000 | ORAL_TABLET | Freq: Four times a day (QID) | ORAL | Status: DC | PRN

## 2015-03-08 MED ORDER — PREDNISONE 20 MG PO TABS: ORAL_TABLET | ORAL | Status: DC

## 2015-03-08 NOTE — Progress Notes (Signed)
Subjective:    Patient ID: Kenneth [name], male    DOB: [birth year], 54 y.o.   MRN: [id number]  HPI Patients on my partner recently for pain in his right great toe. Patient continues to have significant pain in the right first MTP joint. It hurts simply to touch the skin. There is no erythema. There is some warmth. There is no evidence of any cellulitis or septic arthritis. Patient was given a Medrol Dosepak but he did not take the pills correctly. Half a pack is still remaining. He continues to complain of severe pain in the right first MTP joint. Uric acid level was elevated at 7.4 Past Medical History  Diagnosis Date  . Hypercholesteremia   . Hypertension   . Chronic bronchitis ([place])   . Asthma   . GERD (gastroesophageal reflux disease)   . Arthritis   . OSA treated with BiPAP     ahi-132, bipap 23/19   Past Surgical History  Procedure Laterality Date  . Finger amputation    . Esophagogastroduodenoscopy (egd) with propofol N/A 12/11/2012    Procedure: ESOPHAGOGASTRODUODENOSCOPY (EGD) WITH PROPOFOL;  Surgeon: Arta Silence, MD;  Location: [facility];  Service: Endoscopy;  Laterality: N/A;   Current Outpatient Prescriptions on File Prior to Visit  Medication Sig Dispense Refill  . aspirin 81 MG tablet Take 81 mg by mouth daily.    Marland Kitchen docusate sodium (COLACE) 100 MG capsule Take 100 mg by mouth 2 (two) times daily.    . furosemide (LASIX) 20 MG tablet Take 1 tablet (20 mg total) by mouth daily. 30 tablet 5  . hydrocortisone (PROCTOSOL HC) 2.5 % rectal cream Place 1 application rectally 2 (two) times daily.    . lansoprazole (PREVACID) 30 MG capsule TAKE 1 CAPSULE BY MOUTH DAILY AT 12:00 NOON. 90 capsule 1  . methylPREDNISolone (MEDROL) 4 MG tablet Use per package directions. 21 tablet 0  . nitroGLYCERIN (NITROSTAT) 0.4 MG SL tablet Place 1 tablet (0.4 mg total) under the tongue every 5 (five) minutes as needed. For chest pain 20 tablet 1  . oxyCODONE-acetaminophen (ROXICET) 5-325 MG  tablet Take 1 tablet by mouth every 8 (eight) hours as needed for severe pain. 20 tablet 0  . PROAIR HFA 108 (90 BASE) MCG/ACT inhaler INHALE 2 PUFFS INTO THE LUNGS EVERY 4 HOURS AS NEEDED FOR SHORTNESS OF BREATH 8.5 g 2   Current Facility-Administered Medications on File Prior to Visit  Medication Dose Route Frequency Provider Last Rate Last Dose  . lactated ringers infusion    Continuous PRN Chyrel Masson, CRNA       No Known Allergies Social History   Social History  . Marital Status: Divorced    Spouse Name: N/A  . Number of Children: 0  . Years of Education: N/A   Occupational History  . Not on file.   Social History Main Topics  . Smoking status: Current Every Day Smoker -- 1.00 packs/day for 40 years    Types: Cigarettes  . Smokeless tobacco: Not on file  . Alcohol Use: 0.0 oz/week    0 Standard drinks or equivalent per week     Comment: some  . Drug Use: Yes    Special: Cocaine     Comment: patient states none- read lab report 09/02/2012/ 11-20-12 case cx.due to positive screen test-pt. advised no recreational drugs to be used  . Sexual Activity: Not on file   Other Topics Concern  . Not on file   Social History Narrative  Lives with brother and his sister in law.        Review of Systems  All other systems reviewed and are negative.      Objective:   Physical Exam  Cardiovascular: Normal rate, regular rhythm and normal heart sounds.   Pulmonary/Chest: Effort normal and breath sounds normal.  Musculoskeletal:       Right foot: There is decreased range of motion, tenderness, bony tenderness and swelling. There is no deformity.  Vitals reviewed.         Assessment & Plan:  Podagra - Plan: HYDROcodone-acetaminophen (NORCO/VICODIN) 5-325 MG tablet, predniSONE (DELTASONE) 20 MG tablet  Depo-Medrol 80 mg IM 1 and then begin a prednisone taper pack. I explained to the patient to take this exactly how it says on the bottle. His insurance did not  colchicine. If this doesn't work we can try to get a prior authorization for colchicine. I will see the patient back in one week and at that time discussed medicine the lower his uric acid level.  Return immediately if worse

## 2015-03-08 NOTE — Addendum Note (Signed)
Addended by: Shary Decamp B on: 03/08/2015 11:09 AM   Modules accepted: Orders

## 2015-03-12 DIAGNOSIS — G4733 Obstructive sleep apnea (adult) (pediatric): Secondary | ICD-10-CM | POA: Diagnosis not present

## 2015-03-15 ENCOUNTER — Ambulatory Visit (INDEPENDENT_AMBULATORY_CARE_PROVIDER_SITE_OTHER): Payer: Medicare Other | Admitting: Family Medicine

## 2015-03-15 ENCOUNTER — Ambulatory Visit
Admission: RE | Admit: 2015-03-15 | Discharge: 2015-03-15 | Disposition: A | Discharge status: Home / Self Care | Payer: Medicare Other | Source: Ambulatory Visit | Attending: Family Medicine | Admitting: Family Medicine

## 2015-03-15 ENCOUNTER — Encounter: Payer: Self-pay | Admitting: Family Medicine

## 2015-03-15 VITALS — BP 130/74 | HR 94 | Temp 98.3°F | Resp 20 | Ht 66.0 in | Wt 322.0 lb

## 2015-03-15 DIAGNOSIS — M79671 Pain in right foot: Secondary | ICD-10-CM | POA: Diagnosis not present

## 2015-03-15 DIAGNOSIS — M109 Gout, unspecified: Secondary | ICD-10-CM

## 2015-03-15 DIAGNOSIS — M1000 Idiopathic gout, unspecified site: Secondary | ICD-10-CM

## 2015-03-15 DIAGNOSIS — M25571 Pain in right ankle and joints of right foot: Secondary | ICD-10-CM | POA: Diagnosis not present

## 2015-03-15 MED ORDER — COLCHICINE 0.6 MG PO TABS: ORAL_TABLET | ORAL | Status: DC

## 2015-03-15 NOTE — Progress Notes (Signed)
Subjective:    Patient ID: Kenneth [name], male    DOB: 1961/02/09, 54 y.o.   MRN: [id number]  HPI 03/08/15 Patients on my partner recently for pain in his right great toe. Patient continues to have significant pain in the right first MTP joint. It hurts simply to touch the skin. There is no erythema. There is some warmth. There is no evidence of any cellulitis or septic arthritis. Patient was given a Medrol Dosepak but he did not take the pills correctly. Half a pack is still remaining. He continues to complain of severe pain in the right first MTP joint. Uric acid level was elevated at 7.4.  At that time, my plan was: Depo-Medrol 80 mg IM 1 and then begin a prednisone taper pack. I explained to the patient to take this exactly how it says on the bottle. His insurance did not colchicine. If this doesn't work we can try to get a prior authorization for colchicine. I will see the patient back in one week and at that time discussed medicine the lower his uric acid level.  Return immediately if worse  03/15/15 Patient states that the pain in his toe is no better. He is asking for more pain pills. However on exam there is no residual swelling in the toe. There is no erythema in the first MTP joint. He is exquisitely tender to the touch on the distal portion of the toe adjacent to the medial aspect of the toenail. There is no paronychia or evidence of cellulitis. Pain is out of proportion to exam. Past Medical History  Diagnosis Date  . Hypercholesteremia   . Hypertension   . Chronic bronchitis ([place])   . Asthma   . GERD (gastroesophageal reflux disease)   . Arthritis   . OSA treated with BiPAP     ahi-132, bipap 23/19   Past Surgical History  Procedure Laterality Date  . Finger amputation    . Esophagogastroduodenoscopy (egd) with propofol N/A 12/11/2012    Procedure: ESOPHAGOGASTRODUODENOSCOPY (EGD) WITH PROPOFOL;  Surgeon: Arta Silence, MD;  Location: [facility];  Service: Endoscopy;   Laterality: N/A;   Current Outpatient Prescriptions on File Prior to Visit  Medication Sig Dispense Refill  . aspirin 81 MG tablet Take 81 mg by mouth daily.    Marland Kitchen docusate sodium (COLACE) 100 MG capsule Take 100 mg by mouth 2 (two) times daily.    . furosemide (LASIX) 20 MG tablet Take 1 tablet (20 mg total) by mouth daily. 30 tablet 5  . HYDROcodone-acetaminophen (NORCO/VICODIN) 5-325 MG tablet Take 1 tablet by mouth every 6 (six) hours as needed for moderate pain or severe pain. 30 tablet 0  . hydrocortisone (PROCTOSOL HC) 2.5 % rectal cream Place 1 application rectally 2 (two) times daily.    . lansoprazole (PREVACID) 30 MG capsule TAKE 1 CAPSULE BY MOUTH DAILY AT 12:00 NOON. 90 capsule 1  . methylPREDNISolone (MEDROL) 4 MG tablet Use per package directions. 21 tablet 0  . nitroGLYCERIN (NITROSTAT) 0.4 MG SL tablet Place 1 tablet (0.4 mg total) under the tongue every 5 (five) minutes as needed. For chest pain 20 tablet 1  . oxyCODONE-acetaminophen (ROXICET) 5-325 MG tablet Take 1 tablet by mouth every 8 (eight) hours as needed for severe pain. 20 tablet 0  . predniSONE (DELTASONE) 20 MG tablet 3 tabs poqday 1-2, 2 tabs poqday 3-4, 1 tab poqday 5-6 12 tablet 0  . PROAIR HFA 108 (90 BASE) MCG/ACT inhaler INHALE 2 PUFFS INTO THE  LUNGS EVERY 4 HOURS AS NEEDED FOR SHORTNESS OF BREATH 8.5 g 2   Current Facility-Administered Medications on File Prior to Visit  Medication Dose Route Frequency Provider Last Rate Last Dose  . lactated ringers infusion    Continuous PRN Chyrel Masson, CRNA       No Known Allergies Social History   Social History  . Marital Status: Divorced    Spouse Name: N/A  . Number of Children: 0  . Years of Education: N/A   Occupational History  . Not on file.   Social History Main Topics  . Smoking status: Current Every Day Smoker -- 1.00 packs/day for 40 years    Types: Cigarettes  . Smokeless tobacco: Not on file  . Alcohol Use: 0.0 oz/week    0 Standard  drinks or equivalent per week     Comment: some  . Drug Use: Yes    Special: Cocaine     Comment: patient states none- read lab report 09/02/2012/ 11-20-12 case cx.due to positive screen test-pt. advised no recreational drugs to be used  . Sexual Activity: Not on file   Other Topics Concern  . Not on file   Social History Narrative   Lives with brother and his sister in [place].        Review of Systems  All other systems reviewed and are negative.      Objective:   Physical Exam  Cardiovascular: Normal rate, regular rhythm and normal heart sounds.   Pulmonary/Chest: Effort normal and breath sounds normal.  Musculoskeletal:       Right foot: There is tenderness and bony tenderness. There is normal range of motion, no swelling and no deformity.  Vitals reviewed.         Assessment & Plan:  Pain in joint, ankle and foot, right - Plan: DG Foot Complete Right  Podagra - Plan: colchicine 0.6 MG tablet  At this point, I am concerned there may be an element of malingering. I declined further pain medication. I requested an x-ray given the fact the pain is out of proportion to exam. If x-ray is negative, I would recommend colchicine 2 tablets immediately and then 1 tablet an hour. I am very hesitant to give further narcotics given the basis of persistent pain well beyond the swelling and inflammation in his foot which seems to have resolved

## 2015-04-08 ENCOUNTER — Telehealth: Payer: Self-pay | Admitting: Family Medicine

## 2015-04-08 NOTE — Telephone Encounter (Signed)
Just needs to be signed by provider, forwarded to [facility]

## 2015-04-08 NOTE — Telephone Encounter (Signed)
Pt signed an application for disability parking placard. Placed in Dr. Samella Parr folder

## 2015-04-12 DIAGNOSIS — G4733 Obstructive sleep apnea (adult) (pediatric): Secondary | ICD-10-CM | POA: Diagnosis not present

## 2015-04-21 ENCOUNTER — Telehealth: Payer: Self-pay | Admitting: *Deleted

## 2015-04-21 NOTE — Telephone Encounter (Signed)
I called patient but sis ter answered the phone. She states that she was the one with a question. She asked how do we know how the patient is doing on PAP. I advised her that we can pull the information from online or from memory card. She asked to make f/u appt for patient in February.

## 2015-04-29 DIAGNOSIS — G4733 Obstructive sleep apnea (adult) (pediatric): Secondary | ICD-10-CM | POA: Diagnosis not present

## 2015-05-10 ENCOUNTER — Encounter: Payer: Self-pay | Admitting: Neurology

## 2015-05-10 ENCOUNTER — Ambulatory Visit (INDEPENDENT_AMBULATORY_CARE_PROVIDER_SITE_OTHER): Payer: Medicare Other | Admitting: Neurology

## 2015-05-10 VITALS — BP 136/62 | HR 80 | Resp 20 | Ht 66.0 in | Wt 317.0 lb

## 2015-05-10 DIAGNOSIS — G4734 Idiopathic sleep related nonobstructive alveolar hypoventilation: Secondary | ICD-10-CM

## 2015-05-10 DIAGNOSIS — Z7729 Contact with and (suspected ) exposure to other hazardous substances: Secondary | ICD-10-CM

## 2015-05-10 DIAGNOSIS — G4733 Obstructive sleep apnea (adult) (pediatric): Secondary | ICD-10-CM

## 2015-05-10 DIAGNOSIS — Z7722 Contact with and (suspected) exposure to environmental tobacco smoke (acute) (chronic): Secondary | ICD-10-CM

## 2015-05-10 NOTE — Patient Instructions (Signed)
Please continue using your BiPAP regularly. While your insurance requires that you use PAP at least 4 hours each night on 70% of the nights, I recommend, that you not skip any nights and use it throughout the night if you can. Getting used to PAP and staying with the treatment long term does take time and patience and discipline. Untreated obstructive sleep apnea when it is moderate to severe can have an adverse impact on cardiovascular health and raise her risk for heart disease, arrhythmias, hypertension, congestive heart failure, stroke and diabetes. Untreated obstructive sleep apnea causes sleep disruption, nonrestorative sleep, and sleep deprivation. This can have an impact on your day to day functioning and cause daytime sleepiness and impairment of cognitive function, memory loss, mood disturbance, and problems focussing. Using PAP regularly can improve these symptoms.

## 2015-05-10 NOTE — Progress Notes (Signed)
Subjective:    Patient ID: Kenneth [name] is a 55 y.o. male.  HPI     Interim history:   Kenneth [name] is a 55 year old right-handed gentleman with an underlying medical history of hyperlipidemia, coronary artery disease, reflux disease, morbid obesity, arthritis, chronic bronchitis in the setting of smoking, obesity hypoventilation syndrome and syncopal event, who presents for follow-up consultation of his obstructive sleep apnea, after his recent repeat titration studies. The patient is accompanied by his friend, Maudie Mercury, today. I last saw him on 07/30/2014, at which time we talked about his auto BiPAP therapy. He was compliant with treatment but I suggested we bring him back for a full night titration study to optimize treatment setting and determine a set pressure. He had a CPAP titration study on 08/04/2014, this was followed by a BiPAP titration study on 10/15/2014. I went over his test results with him in detail today. His CPAP titration study from 08/04/2014 showed that his sleep apnea was not appropriately treated with CPAP. He was then switched to BiPAP therapy but an optimal pressure was not determined during that study. CPAP was titrated from 5 cm to 16 cm but his AHI was 20 per hour while on CPAP. BiPAP was started at 18/14 and titrated to 22/18 but he had ongoing respiratory events, a brief trial of BiPAP ST was not successful during that study. Sleep efficiency was 79.4%. Sleep latency 7 minutes, wake after sleep onset was 89 minutes with mild to moderate sleep fragmentation noted. Average oxygen saturation was 92%, nadir was 83%. Based on unsuccessful treatment with CPAP therapy and suboptimal treatment with BiPAP therapy during the study I asked him to return for another full night BiPAP titration study. He had this on 10/15/2014. Sleep efficiency was 83.8%, latency to sleep 6 minutes, wake after sleep onset was 63 minutes with moderate sleep fragmentation noted. He had an elevated arousal index.  He had increased percentages of stage I and stage II sleep, absence of slow-wave sleep and a REM percentage of 17.9% with a reduced REM latency of 24 minutes. He had mild PLMS with an index of 10.2 per hour, resulting in 3.9 arousals per hour. BiPAP was started at 8 over 4 cm and titrated to 23/19 cm. AHI was 0 per hour at that pressure. Supplemental oxygen was not utilized. On the final pressure, his desaturation nadir was 90%, supine REM sleep was achieved during the study but not on the final pressure. He indicated that he slept better than usual. Based on his test results are prescribed BiPAP therapy for home use with a setting of 29/19 cm via full facemask.  Today, 05/10/2015: I reviewed his BiPAP compliance data from 04/05/2015 through 05/04/2015 which is a total of 30 days during which time he used his machine only 8 days with percent used days greater than 4 hours at 13% only, indicating poor compliance with an average usage of 1 hour and 17 minutes only. Residual AHI 3.1 per hour, leak low. Set pressure of 23/19 cm.  Today, 05/10/2015: He reports needing new supplies. He admits to not using his machine very much. Has no issues using it though. He sleeps better with it, he admits. He still smokes. He has lost a little bit of weight. Between February 2016 and February this year, he has lost about 10 pounds.  Previously:  I first met him on 05/22/2014 at the request of his primary care physician, at which time the patient reported snoring and daytime somnolence. I  suggested he return for sleep study. He had a split-night sleep study on 05/25/2014 and went over his test results with him in detail today. Baseline sleep efficiency was 77.3% with a latency to sleep of 0 minutes and wake after sleep onset of 35.5 minutes with severe sleep fragmentation noted. He had an elevated arousal index. He had absence of slow-wave sleep and absence of REM sleep prior to CPAP. He had had rare pauses on his EKG, about 3  seconds long. He had moderate to loud snoring. His total AHI was 119 per hour. This line oxygen saturation was 89%, nadir was 76%. He was then titrated on CPAP from 5-15 cm and then placed on BiPAP therapy and titrated from 16/12 cm to 24/20 cm. He did not have resolution of his sleep disordered breathing while on CPAP or BiPAP standard. I suggested Auto BiPAP at home and also suggested he undergo pulse oximetry testing while on BiPAP therapy at home.   Today, 07/30/2014: I reviewed his BiPAP compliance data from 06/28/2014 through 07/27/2014 which is a total of 30 days during which time he used his machine only 22 days. Percent used days greater than 4 hours was 70%, indicating borderline/adequate compliance. Average usage for all nights was 4 hours and 54 minutes. He is on BiPAP with maximum IPAP of 25 cm, minimum EPAP of 14 cm and pressure support of 4. Average AHI was 3.3 per hour, adequate. Leak was acceptable with the 95th percentile at 13.1 L/m. 95th percentile of pressure appears to be 21/17.  Today, 07/30/2014: He reports feeling better with regards to his sleep. He has less daytime somnolence and feels better rested. Of note, he had recent tooth extractions and was therefore not able to use his CPAP at those times. Otherwise, he has been compliant. He still smokes, but has cut back from 2 ppd to perhaps 1/2 ppd. He drinks 2 sodas per day.    Previously:  I spoke with his cardiologist on the phone on 05/19/14; the patient was having some 5-6 s pauses. His Holter monitor was placed earlier this month. On 04/30/2014 he had a syncopal spell. He was in the emergency room for this. He has seen Dr. Percival Spanish for cardiac workup. He has been advised to have a sleep study. He snores, he wakes up not refreshed and he has multiple nighttime awakenings and nocturia at least 4-5 times per night. His Epworth sleepiness score is 19 out of 24. He goes to bed around 9 PM and while he falls asleep fairly quickly he  wakes up almost every hour he says. Sometimes he gets out of bed around 3 AM and never goes back to sleep. He denies restless leg symptoms but has some aching in his legs. He has lower extremity swelling. He wakes up with a headache at times. He is not aware of any family history of obstructive sleep apnea. He has never had a sleep study. He has been overweight for years. He snores and this is at times quite loud. [name] not sure if he actually quits breathing in his sleep but has woken up with a sense of gasping for air. He drinks about 2 Coca-Cola as per day. He does not drink coffee. He occasionally drinks alcohol. He smokes one pack per day.   He lives with his brother and his sister-in-law. He is divorced. He does not have any children. There are no pets in his bed. Occasionally he watches TV in bed. He does not work.  His Past Medical History Is Significant For: Past Medical History  Diagnosis Date  . Hypercholesteremia   . Hypertension   . Chronic bronchitis ([place])   . Asthma   . GERD (gastroesophageal reflux disease)   . Arthritis   . OSA treated with BiPAP     ahi-132, bipap 23/19    His Past Surgical History Is Significant For: Past Surgical History  Procedure Laterality Date  . Finger amputation    . Esophagogastroduodenoscopy (egd) with propofol N/A 12/11/2012    Procedure: ESOPHAGOGASTRODUODENOSCOPY (EGD) WITH PROPOFOL;  Surgeon: Arta Silence, MD;  Location: [facility];  Service: Endoscopy;  Laterality: N/A;    His Family History Is Significant For: Family History  Problem Relation Age of Onset  . Hypertension Father   . Diabetes      Multiple family members  . Diabetes Father   . Diabetes Sister   . Diabetes Brother   . Stroke Sister     His Social History Is Significant For: Social History   Social History  . Marital Status: Divorced    Spouse Name: N/A  . Number of Children: 0  . Years of Education: N/A   Social History Main Topics  . Smoking status:  Current Every Day Smoker -- 1.00 packs/day for 40 years    Types: Cigarettes  . Smokeless tobacco: None  . Alcohol Use: 0.0 oz/week    0 Standard drinks or equivalent per week     Comment: some  . Drug Use: Yes    Special: Cocaine     Comment: patient states none- read lab report 09/02/2012/ 11-20-12 case cx.due to positive screen test-pt. advised no recreational drugs to be used  . Sexual Activity: Not Asked   Other Topics Concern  . None   Social History Narrative   Lives with brother and his sister in [place].      His Allergies Are:  No Known Allergies:   His Current Medications Are:  Outpatient Encounter Prescriptions as of 05/10/2015  Medication Sig  . aspirin 81 MG tablet Take 81 mg by mouth daily.  . colchicine 0.6 MG tablet Take 2 pills immediately and then repeat in 1 hour if pain persists.  . docusate sodium (COLACE) 100 MG capsule Take 100 mg by mouth 2 (two) times daily.  . furosemide (LASIX) 20 MG tablet Take 1 tablet (20 mg total) by mouth daily.  Marland Kitchen HYDROcodone-acetaminophen (NORCO/VICODIN) 5-325 MG tablet Take 1 tablet by mouth every 6 (six) hours as needed for moderate pain or severe pain.  . hydrocortisone (PROCTOSOL HC) 2.5 % rectal cream Place 1 application rectally 2 (two) times daily.  . lansoprazole (PREVACID) 30 MG capsule TAKE 1 CAPSULE BY MOUTH DAILY AT 12:00 NOON.  . nitroGLYCERIN (NITROSTAT) 0.4 MG SL tablet Place 1 tablet (0.4 mg total) under the tongue every 5 (five) minutes as needed. For chest pain  . oxyCODONE-acetaminophen (ROXICET) 5-325 MG tablet Take 1 tablet by mouth every 8 (eight) hours as needed for severe pain.  Marland Kitchen PROAIR HFA 108 (90 BASE) MCG/ACT inhaler INHALE 2 PUFFS INTO THE LUNGS EVERY 4 HOURS AS NEEDED FOR SHORTNESS OF BREATH   Facility-Administered Encounter Medications as of 05/10/2015  Medication  . lactated ringers infusion  :  Review of Systems:  Out of a complete 14 point review of systems, all are reviewed and negative with the  exception of these symptoms as listed below:   Review of Systems  Neurological:       Patient denies any  new complaints, states that he need new supplies.     Objective:  Neurologic Exam  Physical Exam Physical Examination:   Filed Vitals:   05/10/15 0958  BP: 136/62  Pulse: 80  Resp: 20    General Examination: The patient is a very pleasant 55 y.o. male in no acute distress. He is morbidly obese. He is adequately groomed.   HEENT: Normocephalic, atraumatic, pupils are equal, round and reactive to light and accommodation. Conjunctivae are dry and irritated appearing, stable. Extraocular tracking is good without limitation to gaze excursion or nystagmus noted. Normal smooth pursuit is noted. Hearing is grossly intact. Face is symmetric with normal facial animation and normal facial sensation. Speech is clear with no dysarthria noted. There is no hypophonia. There is no lip, neck/head, jaw or voice tremor. Neck is supple with full range of passive and active motion. There are no carotid bruits on auscultation. Oropharynx exam reveals: moderate mouth dryness, adequate dental hygiene with s/p tooth extractions in the b/l lower jaw and L upper jaw, and marked airway crowding, due to large tongue, redundant and thick soft palate, narrow airway entry, and tonsils were not visualized. Mallampati is class III. Tongue protrudes centrally and palate elevates symmetrically.    Chest: Clear to auscultation without wheezing, rhonchi or crackles noted.  Heart: S1+S2+0, regular and normal without murmurs, rubs or gallops noted.   Abdomen: Soft, non-tender and non-distended with normal bowel sounds appreciated on auscultation.  Extremities: There is trace pitting edema in the distal lower extremities bilaterally,  and he reports pain in his right great toe. He was recently diagnosed with gout. Chronic discoloration and stasis-like changes are seen. Pedal pulses are intact.  Skin: Dry appearing  especially in the distal lower extremities. There are no varicose veins.  Musculoskeletal: exam reveals no obvious joint deformities, tenderness or joint swelling or erythema, with the exception of pain and decreased range of motion of his left leg. He said he injured his left leg in the past.   Neurologically:  Mental status: The patient is awake, alert and oriented in all 4 spheres. His immediate and remote memory, attention, language skills and fund of knowledge are appropriate. There is no evidence of aphasia, agnosia, apraxia or anomia. Speech is clear with normal prosody and enunciation. Thought process is linear. Mood is normal and affect is normal.  Cranial nerves II - XII are as described above under HEENT exam. In addition: shoulder shrug is normal with equal shoulder height noted. Motor exam: Normal bulk, strength and tone is noted. There is no drift, tremor or rebound. Romberg is negative. Reflexes are 1+ throughout. Fine motor skills and coordination: intact  in the upper extremities and left lower extremity but difficulty with the right lower extremity due to pain in his foot. Cerebellar testing: No dysmetria or intention tremor on finger to nose testing. Heel to shin is difficult for him due to body habitus. There is no truncal or gait ataxia.  Sensory exam: intact to light touch in the upper and lower extremities.  Gait, station and balance: He stands with difficulty. No veering to one side is noted. No leaning to one side is noted. Posture is age-appropriate and stance is wide-based, likely secondary to body habitus. He walks slowly, has a limp on the right. .            Assessment and Plan:   In summary, Kenneth [name] is a very pleasant 55 year old male with an underlying complex medical history  of hyperlipidemia, coronary artery disease, reflux disease, morbid obesity, arthritis, chronic bronchitis in the setting of smoking, obesity hypoventilation syndrome, syncope (seen by  cardiology), who presents for follow-up consultation of his severe obstructive sleep apnea with evidence of significant nocturnal desaturations and nocturnal hypoxemia, in the context of ongoing smoking, morbid obesity.  I had a long chat with the patient about his sleep study findings  May 2016 as well as July 2016. He is again advised that he has severe obstructive sleep apnea responded reasonably well on BiPAP therapy eventually. He has in the past been better with his compliance and has since then skip most nights. He has no objection to using BiPAP and in fact does endorse that he sleeps better with it as opposed to without it. He is again advised about the sinister complications of untreated severe obstructive sleep apnea, especially with respect to developing cardiovascular disease down the Road, including congestive heart failure, difficult to treat hypertension, cardiac arrhythmias, or stroke. Even type 2 diabetes has, in part, been linked to untreated OSA. Symptoms of untreated OSA include daytime sleepiness, memory problems, mood irritability and mood disorder such as depression and anxiety, lack of energy, as well as recurrent headaches, especially morning headaches. We talked about smoking cessation and trying to maintain a healthy lifestyle in general, as well as the importance of weight control. I encouraged the patient to eat healthy, exercise daily and keep well hydrated, to keep a scheduled bedtime and wake time routine, to not skip any meals and eat healthy snacks in between meals.  he is strongly advised to quit smoking and to try to work on weight loss aggressively. We reviewed his recent compliance data and he is advised to be fully compliant with BiPAP therapy.  His physical exam is stable with the exception of new foot pain for which he saw his primary care physician in December 2016. I will see him back in 6 months, sooner if needed. I placed an order for BiPAP supplies. He is advised to  call for any interim questions or concerns.  I answered all his questions today and the patient was in agreement.  I spent 20 minutes in total face-to-face time with the patient, more than 50% of which was spent in counseling and coordination of care, reviewing test results, reviewing medication and discussing or reviewing the diagnosis of OSA, its prognosis and treatment options.

## 2015-05-13 DIAGNOSIS — G4733 Obstructive sleep apnea (adult) (pediatric): Secondary | ICD-10-CM | POA: Diagnosis not present

## 2015-05-25 ENCOUNTER — Ambulatory Visit (INDEPENDENT_AMBULATORY_CARE_PROVIDER_SITE_OTHER): Payer: Medicare Other | Admitting: Podiatry

## 2015-05-25 ENCOUNTER — Encounter: Payer: Self-pay | Admitting: Podiatry

## 2015-05-25 DIAGNOSIS — R234 Changes in skin texture: Secondary | ICD-10-CM

## 2015-05-25 MED ORDER — KETOCONAZOLE 2 % EX CREA: 1.0000 "application " | TOPICAL_CREAM | Freq: Every day | CUTANEOUS | Status: DC

## 2015-05-25 NOTE — Progress Notes (Signed)
   Subjective:    Patient ID: Kenneth [name], male    DOB: 1960-10-07, 55 y.o.   MRN: [id number]  HPI: He presents today with his wife is a 55 year old black male with a chief complaint of dry cracked heels bilaterally. He states sometimes it'll crack so deeply they hurt. He does relate wearing no socks and wearing slides throughout the house with flip-flops and sandals as his primary shoes. Currently he has not had any infections but does relate dry skin to his foot and the heel fissures chronic for many years.    Review of Systems  Respiratory: Positive for shortness of breath.   Cardiovascular: Positive for leg swelling.       Objective:   Physical Exam: Vital signs are stable he is alert and oriented 3 pulses are palpable. Neurologic sensorium is intact and muscle strength is normal. Orthopedic evaluation demonstrates severe pes planus bilateral. Otherwise I'll just distal to the ankle for range of motion without crepitation. Cutaneous evaluation demonstrates supple well-hydrated used to the dorsal aspect of the foot dry xerotic skin plantarly this does appear that it may have a fungal component to it as well however he does have dry cracked fissured heels do not demonstrate any signs of infection or bleeding at this point.        Assessment & Plan:  Assessment: Xerosis and fissured skin.  Plan: I started him on ketoconazole and O'keffes hand and foot cream. I will follow-up with him in approximately 1 month.

## 2015-05-31 ENCOUNTER — Ambulatory Visit: Payer: Medicare Other | Admitting: Family Medicine

## 2015-06-10 DIAGNOSIS — G4733 Obstructive sleep apnea (adult) (pediatric): Secondary | ICD-10-CM | POA: Diagnosis not present

## 2015-07-13 ENCOUNTER — Ambulatory Visit (INDEPENDENT_AMBULATORY_CARE_PROVIDER_SITE_OTHER): Payer: Medicare Other

## 2015-07-13 ENCOUNTER — Ambulatory Visit (INDEPENDENT_AMBULATORY_CARE_PROVIDER_SITE_OTHER): Payer: Medicare Other | Admitting: Podiatry

## 2015-07-13 ENCOUNTER — Encounter: Payer: Self-pay | Admitting: Podiatry

## 2015-07-13 VITALS — BP 142/77 | HR 90 | Resp 18

## 2015-07-13 DIAGNOSIS — M779 Enthesopathy, unspecified: Secondary | ICD-10-CM

## 2015-07-13 DIAGNOSIS — M778 Other enthesopathies, not elsewhere classified: Secondary | ICD-10-CM

## 2015-07-13 DIAGNOSIS — M7751 Other enthesopathy of right foot: Secondary | ICD-10-CM

## 2015-07-13 DIAGNOSIS — R52 Pain, unspecified: Secondary | ICD-10-CM

## 2015-07-13 DIAGNOSIS — B353 Tinea pedis: Secondary | ICD-10-CM

## 2015-07-13 MED ORDER — KETOCONAZOLE 2 % EX CREA: 1.0000 "application " | TOPICAL_CREAM | Freq: Every day | CUTANEOUS | Status: DC

## 2015-07-13 NOTE — Progress Notes (Signed)
Kenneth [name] presents today for follow-up of his tinea pedis plantar aspect of the bilateral foot. [name] also complaining about pain to the right hallux. He denies any trauma to the right hallux. He states that his feet are doing much better he's very happy with the use of the antifungal and the okeffe's medication.  Objective: Vital signs are stable he is alert and oriented 3. Pulses are strongly palpable. He has pain on range of motion of the hallux interphalangeal joint and the first metatarsophalangeal joint. There is minimal edema no erythema saline as drainage or odor radiographs taken today do demonstrate an interphalangeal sesamoid as well as some early osteoarthritic changes. No signs of infection or skin breakdown or bone breakdown.  Assessment: Well-healing tinea pedis. 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  Plan: I rewrote his prescription for ketoconazole and injected with dexamethasone the IP joint of the right hallux. Follow-up with him in 6 weeks

## 2015-08-03 DIAGNOSIS — G4733 Obstructive sleep apnea (adult) (pediatric): Secondary | ICD-10-CM | POA: Diagnosis not present

## 2015-08-10 ENCOUNTER — Ambulatory Visit (INDEPENDENT_AMBULATORY_CARE_PROVIDER_SITE_OTHER): Payer: Medicare Other | Admitting: Family Medicine

## 2015-08-10 ENCOUNTER — Encounter: Payer: Self-pay | Admitting: Family Medicine

## 2015-08-10 VITALS — BP 120/76 | HR 84 | Temp 97.9°F | Resp 20 | Wt 312.0 lb

## 2015-08-10 DIAGNOSIS — M25561 Pain in right knee: Secondary | ICD-10-CM

## 2015-08-10 DIAGNOSIS — M25562 Pain in left knee: Secondary | ICD-10-CM

## 2015-08-10 MED ORDER — DICLOFENAC SODIUM 75 MG PO TBEC: 75.0000 mg | DELAYED_RELEASE_TABLET | Freq: Two times a day (BID) | ORAL | Status: DC

## 2015-08-10 NOTE — Progress Notes (Signed)
Subjective:    Patient ID: Kenneth [name], male    DOB: [birth year], 55 y.o.   MRN: [id number]  HPI  Patient reports 2-3 months of bilateral knee pain. The pain is worse in his left knee compared to his right knee. The pain is located posteriorly on both sides. He reports pain with standing and with walking. He Moses he climbs on and off of the exam table. He moans when I palpate his knee. There is no laxity to varus or valgus stress bilaterally. He has a negative anterior posterior drawer sign bilaterally however he moans with each test I perform on either knee. There is no erythema or effusion. There is no warmth. He has a negative Apley grind although he does wince in pain with flexion of both knees. Pain seems to be equal on the medial and lateral joint lines. Eyes any specific injury. He has not tried any Tylenol or ibuprofen. He states that he has been taking oxycodone which helps the pain is requesting a refill of oxycodone Past Medical History  Diagnosis Date  . Hypercholesteremia   . Hypertension   . Chronic bronchitis ([place])   . Asthma   . GERD (gastroesophageal reflux disease)   . Arthritis   . OSA treated with BiPAP     ahi-132, bipap 23/19   Past Surgical History  Procedure Laterality Date  . Finger amputation    . Esophagogastroduodenoscopy (egd) with propofol N/A 12/11/2012    Procedure: ESOPHAGOGASTRODUODENOSCOPY (EGD) WITH PROPOFOL;  Surgeon: Arta Silence, MD;  Location: [facility];  Service: Endoscopy;  Laterality: N/A;   Current Outpatient Prescriptions on File Prior to Visit  Medication Sig Dispense Refill  . aspirin 81 MG tablet Take 81 mg by mouth daily.    . colchicine 0.6 MG tablet Take 2 pills immediately and then repeat in 1 hour if pain persists. 10 tablet 0  . docusate sodium (COLACE) 100 MG capsule Take 100 mg by mouth 2 (two) times daily.    . furosemide (LASIX) 20 MG tablet Take 1 tablet (20 mg total) by mouth daily. 30 tablet 5  . hydrocortisone  (PROCTOSOL HC) 2.5 % rectal cream Place 1 application rectally 2 (two) times daily.    Marland Kitchen ketoconazole (NIZORAL) 2 % cream Apply 1 application topically daily. Apply to affected area twice daily. 60 g 5  . lansoprazole (PREVACID) 30 MG capsule TAKE 1 CAPSULE BY MOUTH DAILY AT 12:00 NOON. 90 capsule 1  . nitroGLYCERIN (NITROSTAT) 0.4 MG SL tablet Place 1 tablet (0.4 mg total) under the tongue every 5 (five) minutes as needed. For chest pain 20 tablet 1  . oxyCODONE-acetaminophen (ROXICET) 5-325 MG tablet Take 1 tablet by mouth every 8 (eight) hours as needed for severe pain. 20 tablet 0  . PROAIR HFA 108 (90 BASE) MCG/ACT inhaler INHALE 2 PUFFS INTO THE LUNGS EVERY 4 HOURS AS NEEDED FOR SHORTNESS OF BREATH 8.5 g 2   Current Facility-Administered Medications on File Prior to Visit  Medication Dose Route Frequency Provider Last Rate Last Dose  . lactated ringers infusion    Continuous PRN Chyrel Masson, CRNA       No Known Allergies Social History   Social History  . Marital Status: Divorced    Spouse Name: N/A  . Number of Children: 0  . Years of Education: N/A   Occupational History  . Not on file.   Social History Main Topics  . Smoking status: Current Every Day Smoker -- 1.00 packs/day  for 40 years    Types: Cigarettes  . Smokeless tobacco: Not on file  . Alcohol Use: 0.0 oz/week    0 Standard drinks or equivalent per week     Comment: some  . Drug Use: Yes    Special: Cocaine     Comment: patient states none- read lab report 09/02/2012/ 11-20-12 case cx.due to positive screen test-pt. advised no recreational drugs to be used  . Sexual Activity: Not on file   Other Topics Concern  . Not on file   Social History Narrative   Lives with brother and his sister in [place].       Review of Systems  All other systems reviewed and are negative.      Objective:   Physical Exam  Cardiovascular: Normal rate, regular rhythm and normal heart sounds.   Pulmonary/Chest: Effort  normal and breath sounds normal.  Musculoskeletal:       Right knee: He exhibits bony tenderness. He exhibits normal range of motion, no swelling, no effusion, no erythema, no LCL laxity, normal patellar mobility, normal meniscus and no MCL laxity. Tenderness found. Medial joint line, lateral joint line, MCL and LCL tenderness noted.       Left knee: He exhibits bony tenderness. He exhibits normal range of motion, no swelling, no effusion, no erythema, no LCL laxity, normal patellar mobility, normal meniscus and no MCL laxity. Tenderness found. Medial joint line, lateral joint line, MCL and LCL tenderness noted.  Vitals reviewed.         Assessment & Plan:  Bilateral knee pain - Plan: DG Knee Complete 4 Views Left, DG Knee Complete 4 Views Right, diclofenac (VOLTAREN) 75 MG EC tablet  Pain is out of proportion to exam. It is difficult to ascertain the exact source of his pain given the fact he reports pain with every provocative test that I performed. Given his morbid obesity, I suspect that he has osteoarthritis in both knees. I would like to obtain x-rays of both knees to evaluate further. His left knee is the worst. Using sterile technique, I injected the left knee with a mixture of 2 mL of lidocaine, 2 mL of Marcaine, and 2 mL of 40 mg per mL Kenalog. The patient tolerated the procedure well with no complication. I would like him to begin diclofenac 75 mg by mouth twice a day for arthritis. Further treatment pending the results of the x-rays and the performance of a cortisone injection. He also complains of stiffness and tightness in his hamstrings bilaterally. There is no evidence of claudication and he has normal pedal pulses bilaterally. I believe he has hamstring stiffness secondary to an abnormal gait because of his knee pain. I will not prescribe oxycodone until we have exhausted all conservative options and I explained this to the patient in detail.

## 2015-08-11 ENCOUNTER — Ambulatory Visit
Admission: RE | Admit: 2015-08-11 | Discharge: 2015-08-11 | Disposition: A | Discharge status: Home / Self Care | Payer: Medicare Other | Source: Ambulatory Visit | Attending: Family Medicine | Admitting: Family Medicine

## 2015-08-11 DIAGNOSIS — M170 Bilateral primary osteoarthritis of knee: Secondary | ICD-10-CM | POA: Diagnosis not present

## 2015-08-11 DIAGNOSIS — M25561 Pain in right knee: Secondary | ICD-10-CM

## 2015-08-11 DIAGNOSIS — M25562 Pain in left knee: Secondary | ICD-10-CM

## 2015-08-17 ENCOUNTER — Encounter: Payer: Self-pay | Admitting: Family Medicine

## 2015-08-17 ENCOUNTER — Ambulatory Visit (INDEPENDENT_AMBULATORY_CARE_PROVIDER_SITE_OTHER): Payer: Medicare Other | Admitting: Family Medicine

## 2015-08-17 VITALS — BP 160/80 | HR 98 | Temp 98.0°F | Resp 18 | Ht 66.0 in | Wt 312.0 lb

## 2015-08-17 DIAGNOSIS — M25562 Pain in left knee: Secondary | ICD-10-CM

## 2015-08-17 DIAGNOSIS — M25561 Pain in right knee: Secondary | ICD-10-CM | POA: Diagnosis not present

## 2015-08-17 MED ORDER — HYDROCODONE-ACETAMINOPHEN 5-325 MG PO TABS: 1.0000 | ORAL_TABLET | Freq: Four times a day (QID) | ORAL | Status: DC | PRN

## 2015-08-17 NOTE — Progress Notes (Signed)
Subjective:    Patient ID: Kenneth [name], male    DOB: Aug 13, 1960, 55 y.o.   MRN: [id number]  HPI  08/10/15 Patient reports 2-3 months of bilateral knee pain. The pain is worse in his left knee compared to his right knee. The pain is located posteriorly on both sides. He reports pain with standing and with walking. He Moses he climbs on and off of the exam table. He moans when I palpate his knee. There is no laxity to varus or valgus stress bilaterally. He has a negative anterior posterior drawer sign bilaterally however he moans with each test I perform on either knee. There is no erythema or effusion. There is no warmth. He has a negative Apley grind although he does wince in pain with flexion of both knees. Pain seems to be equal on the medial and lateral joint lines. Eyes any specific injury. He has not tried any Tylenol or ibuprofen. He states that he has been taking oxycodone which helps the pain is requesting a refill of oxycodone.  At that time, my plan was: Pain is out of proportion to exam. It is difficult to ascertain the exact source of his pain given the fact he reports pain with every provocative test that I performed. Given his morbid obesity, I suspect that he has osteoarthritis in both knees. I would like to obtain x-rays of both knees to evaluate further. His left knee is the worst. Using sterile technique, I injected the left knee with a mixture of 2 mL of lidocaine, 2 mL of Marcaine, and 2 mL of 40 mg per mL Kenalog. The patient tolerated the procedure well with no complication. I would like him to begin diclofenac 75 mg by mouth twice a day for arthritis. Further treatment pending the results of the x-rays and the performance of a cortisone injection. He also complains of stiffness and tightness in his hamstrings bilaterally. There is no evidence of claudication and he has normal pedal pulses bilaterally. I believe he has hamstring stiffness secondary to an abnormal gait because of his  knee pain. I will not prescribe oxycodone until we have exhausted all conservative options and I explained this to the patient in detail.  08/17/15 X-rays revealed stable mild patellofemoral degenerative changes bilaterally but no severe osteoarthritis, fracture, or dislocation.  I reviewed the x-rays with the patient today in clinic. Joint spaces seem well preserved. There is no explanation on the x-rays the severity of his knee pain. He states that the cortisone injection he had the left knee helped his pain for one or 2 days and the pain came back just as severe. He reports severe pain with standing. There is no erythema or effusion. Pain is out of proportion to exam. Past Medical History  Diagnosis Date  . Hypercholesteremia   . Hypertension   . Chronic bronchitis ([place])   . Asthma   . GERD (gastroesophageal reflux disease)   . Arthritis   . OSA treated with BiPAP     ahi-132, bipap 23/19   Past Surgical History  Procedure Laterality Date  . Finger amputation    . Esophagogastroduodenoscopy (egd) with propofol N/A 12/11/2012    Procedure: ESOPHAGOGASTRODUODENOSCOPY (EGD) WITH PROPOFOL;  Surgeon: Arta Silence, MD;  Location: [facility];  Service: Endoscopy;  Laterality: N/A;   Current Outpatient Prescriptions on File Prior to Visit  Medication Sig Dispense Refill  . aspirin 81 MG tablet Take 81 mg by mouth daily.    . colchicine 0.6 MG  tablet Take 2 pills immediately and then repeat in 1 hour if pain persists. 10 tablet 0  . diclofenac (VOLTAREN) 75 MG EC tablet Take 1 tablet (75 mg total) by mouth 2 (two) times daily. 60 tablet 0  . docusate sodium (COLACE) 100 MG capsule Take 100 mg by mouth 2 (two) times daily.    . furosemide (LASIX) 20 MG tablet Take 1 tablet (20 mg total) by mouth daily. 30 tablet 5  . hydrocortisone (PROCTOSOL HC) 2.5 % rectal cream Place 1 application rectally 2 (two) times daily.    Marland Kitchen ketoconazole (NIZORAL) 2 % cream Apply 1 application topically daily.  Apply to affected area twice daily. 60 g 5  . lansoprazole (PREVACID) 30 MG capsule TAKE 1 CAPSULE BY MOUTH DAILY AT 12:00 NOON. 90 capsule 1  . nitroGLYCERIN (NITROSTAT) 0.4 MG SL tablet Place 1 tablet (0.4 mg total) under the tongue every 5 (five) minutes as needed. For chest pain 20 tablet 1  . oxyCODONE-acetaminophen (ROXICET) 5-325 MG tablet Take 1 tablet by mouth every 8 (eight) hours as needed for severe pain. 20 tablet 0  . PROAIR HFA 108 (90 BASE) MCG/ACT inhaler INHALE 2 PUFFS INTO THE LUNGS EVERY 4 HOURS AS NEEDED FOR SHORTNESS OF BREATH 8.5 g 2   Current Facility-Administered Medications on File Prior to Visit  Medication Dose Route Frequency Provider Last Rate Last Dose  . lactated ringers infusion    Continuous PRN Chyrel Masson, CRNA       No Known Allergies Social History   Social History  . Marital Status: Divorced    Spouse Name: N/A  . Number of Children: 0  . Years of Education: N/A   Occupational History  . Not on file.   Social History Main Topics  . Smoking status: Current Every Day Smoker -- 1.00 packs/day for 40 years    Types: Cigarettes  . Smokeless tobacco: Not on file  . Alcohol Use: 0.0 oz/week    0 Standard drinks or equivalent per week     Comment: some  . Drug Use: Yes    Special: Cocaine     Comment: patient states none- read lab report 09/02/2012/ 11-20-12 case cx.due to positive screen test-pt. advised no recreational drugs to be used  . Sexual Activity: Not on file   Other Topics Concern  . Not on file   Social History Narrative   Lives with brother and his sister in [place].       Review of Systems  All other systems reviewed and are negative.      Objective:   Physical Exam  Cardiovascular: Normal rate, regular rhythm and normal heart sounds.   Pulmonary/Chest: Effort normal and breath sounds normal.  Musculoskeletal:       Right knee: He exhibits bony tenderness. He exhibits normal range of motion, no swelling, no effusion,  no erythema, no LCL laxity, normal patellar mobility, normal meniscus and no MCL laxity. Tenderness found. Medial joint line, lateral joint line, MCL and LCL tenderness noted.       Left knee: He exhibits bony tenderness. He exhibits normal range of motion, no swelling, no effusion, no erythema, no LCL laxity, normal patellar mobility, normal meniscus and no MCL laxity. Tenderness found. Medial joint line, lateral joint line, MCL and LCL tenderness noted.  Vitals reviewed.         Assessment & Plan:  Bilateral knee pain - Plan: Ambulatory referral to Orthopedic Surgery  I am concerned about malingering for  secondary gain. Another possibility would be some type of cartilage defect such as a meniscal tear that would only be evidenced on MRI. Therefore I will consult orthopedics. In good faith I will give the patient Norco 5/325 one every 6 hours for severe pain. I gave him 30 tablets. I asked pointed the patient that I would not refill this. I will not prescribe long-term narcotics when there is no obvious cause for his knee pain on the x-ray. I will wait to see orthopedics recommendations.

## 2015-08-27 ENCOUNTER — Telehealth: Payer: Self-pay | Admitting: Family Medicine

## 2015-08-27 NOTE — Telephone Encounter (Signed)
Pt is requesting a refill of Oxycodone 5-325 [phone]

## 2015-08-30 NOTE — Telephone Encounter (Signed)
Oxycodone refill request.  Last seen 08/10/2015, last filled 03/01/2015.  Please advise.

## 2015-08-30 NOTE — Telephone Encounter (Signed)
Patient aware it is to early for hydrocodone refill.  He has been referred to Ortho.

## 2015-08-30 NOTE — Telephone Encounter (Signed)
Pt given Norco on 5/23, per Dr. Dennard Schaumann, no Refills on narcotics, he has been sent to see ortho for his knee pain

## 2015-08-31 ENCOUNTER — Ambulatory Visit: Payer: Medicare Other | Admitting: Podiatry

## 2015-09-07 ENCOUNTER — Other Ambulatory Visit: Payer: Self-pay | Admitting: Family Medicine

## 2015-09-07 MED ORDER — OXYCODONE-ACETAMINOPHEN 5-325 MG PO TABS: 1.0000 | ORAL_TABLET | Freq: Three times a day (TID) | ORAL | Status: DC | PRN

## 2015-09-14 ENCOUNTER — Ambulatory Visit (INDEPENDENT_AMBULATORY_CARE_PROVIDER_SITE_OTHER): Payer: Medicare Other | Admitting: Podiatry

## 2015-09-14 ENCOUNTER — Encounter: Payer: Self-pay | Admitting: Podiatry

## 2015-09-14 VITALS — BP 128/75 | HR 74 | Resp 16

## 2015-09-14 DIAGNOSIS — M7751 Other enthesopathy of right foot: Secondary | ICD-10-CM | POA: Diagnosis not present

## 2015-09-14 DIAGNOSIS — M779 Enthesopathy, unspecified: Principal | ICD-10-CM

## 2015-09-14 DIAGNOSIS — M778 Other enthesopathies, not elsewhere classified: Secondary | ICD-10-CM

## 2015-09-14 NOTE — Progress Notes (Signed)
He presents today with a chief complaint of pain along the medial aspect of the hallux right distal to the IP joint. He states the last injection seemed to help.  Objective: Vital signs are stable he is alert and oriented 3 is tenderness on palpation skin to the tibial aspect of the right great toe. There is no underlying erythema cellulitis drainage or odor. He has pain on range of motion of the IP joint.  Assessment: Capsulitis cannot rule out paronychia.  Plan: I injected IP joint area medial aspect today with Kenalog and local anesthetic as opposed to dexamethasone I will follow-up with him in a couple of months.

## 2015-09-22 ENCOUNTER — Other Ambulatory Visit: Payer: Self-pay | Admitting: Family Medicine

## 2015-10-06 DIAGNOSIS — M25562 Pain in left knee: Secondary | ICD-10-CM | POA: Diagnosis not present

## 2015-10-18 ENCOUNTER — Telehealth: Payer: Self-pay | Admitting: Family Medicine

## 2015-10-18 NOTE — Telephone Encounter (Signed)
Mary calling to speak to you regarding a referral to an ortho in [place], also calling about a specialists visit that he never got the results for, and it has been two weeks [phone]

## 2015-10-19 NOTE — Telephone Encounter (Signed)
I spoke to sister of patient.  Pt very upset.  Knee is swollen and painful.  Has had MRI and can get no results or follow up appt with Dr Alvan Dame at [facility] and spoke to nurse there.  Told them someone needs to reach out to this patient and let him know what is going on.  He is in a lot of pain.  They assured me someone would call him today.

## 2015-10-20 ENCOUNTER — Encounter: Payer: Self-pay | Admitting: Neurology

## 2015-10-26 ENCOUNTER — Ambulatory Visit: Payer: Medicare Other | Admitting: Podiatry

## 2015-11-08 ENCOUNTER — Ambulatory Visit: Payer: Medicare Other | Admitting: Neurology

## 2015-11-10 ENCOUNTER — Encounter (HOSPITAL_COMMUNITY): Payer: Self-pay | Admitting: *Deleted

## 2015-11-10 DIAGNOSIS — G4733 Obstructive sleep apnea (adult) (pediatric): Secondary | ICD-10-CM | POA: Diagnosis not present

## 2015-11-14 MED ORDER — DEXTROSE 5 % IV SOLN
3.0000 g | INTRAVENOUS | Status: AC
  Administered 2015-11-15: 3 g via INTRAVENOUS
  Filled 2015-11-14: qty 3

## 2015-11-15 ENCOUNTER — Encounter (HOSPITAL_COMMUNITY)
Admission: RE | Disposition: A | Discharge status: Home / Self Care | Payer: Self-pay | Source: Ambulatory Visit | Attending: Orthopedic Surgery

## 2015-11-15 ENCOUNTER — Ambulatory Visit (HOSPITAL_COMMUNITY)
Admission: RE | Admit: 2015-11-15 | Discharge: 2015-11-15 | Disposition: A | Discharge status: Home / Self Care | Payer: Medicare Other | Source: Ambulatory Visit | Attending: Orthopedic Surgery | Admitting: Orthopedic Surgery

## 2015-11-15 ENCOUNTER — Encounter (HOSPITAL_COMMUNITY): Payer: Self-pay | Admitting: *Deleted

## 2015-11-15 ENCOUNTER — Ambulatory Visit (HOSPITAL_COMMUNITY): Payer: Medicare Other | Admitting: Certified Registered Nurse Anesthetist

## 2015-11-15 DIAGNOSIS — S83232A Complex tear of medial meniscus, current injury, left knee, initial encounter: Secondary | ICD-10-CM | POA: Insufficient documentation

## 2015-11-15 DIAGNOSIS — Z6841 Body Mass Index (BMI) 40.0 and over, adult: Secondary | ICD-10-CM | POA: Diagnosis not present

## 2015-11-15 DIAGNOSIS — G4733 Obstructive sleep apnea (adult) (pediatric): Secondary | ICD-10-CM | POA: Insufficient documentation

## 2015-11-15 DIAGNOSIS — Z9989 Dependence on other enabling machines and devices: Secondary | ICD-10-CM | POA: Insufficient documentation

## 2015-11-15 DIAGNOSIS — I10 Essential (primary) hypertension: Secondary | ICD-10-CM | POA: Diagnosis not present

## 2015-11-15 DIAGNOSIS — J449 Chronic obstructive pulmonary disease, unspecified: Secondary | ICD-10-CM | POA: Diagnosis not present

## 2015-11-15 DIAGNOSIS — X58XXXA Exposure to other specified factors, initial encounter: Secondary | ICD-10-CM | POA: Diagnosis not present

## 2015-11-15 DIAGNOSIS — K219 Gastro-esophageal reflux disease without esophagitis: Secondary | ICD-10-CM | POA: Insufficient documentation

## 2015-11-15 DIAGNOSIS — M25562 Pain in left knee: Secondary | ICD-10-CM | POA: Diagnosis present

## 2015-11-15 DIAGNOSIS — M1990 Unspecified osteoarthritis, unspecified site: Secondary | ICD-10-CM | POA: Insufficient documentation

## 2015-11-15 DIAGNOSIS — S83282A Other tear of lateral meniscus, current injury, left knee, initial encounter: Secondary | ICD-10-CM | POA: Insufficient documentation

## 2015-11-15 DIAGNOSIS — Z7982 Long term (current) use of aspirin: Secondary | ICD-10-CM | POA: Insufficient documentation

## 2015-11-15 DIAGNOSIS — Z79899 Other long term (current) drug therapy: Secondary | ICD-10-CM | POA: Diagnosis not present

## 2015-11-15 DIAGNOSIS — M23322 Other meniscus derangements, posterior horn of medial meniscus, left knee: Secondary | ICD-10-CM | POA: Diagnosis not present

## 2015-11-15 DIAGNOSIS — S83242A Other tear of medial meniscus, current injury, left knee, initial encounter: Secondary | ICD-10-CM | POA: Diagnosis not present

## 2015-11-15 DIAGNOSIS — M23342 Other meniscus derangements, anterior horn of lateral meniscus, left knee: Secondary | ICD-10-CM | POA: Diagnosis not present

## 2015-11-15 HISTORY — PX: KNEE ARTHROSCOPY: SHX127

## 2015-11-15 LAB — CBC
HCT: 46.2 % (ref 39.0–52.0)
Hemoglobin: 15.2 g/dL (ref 13.0–17.0)
MCH: 27 pg (ref 26.0–34.0)
MCHC: 32.9 g/dL (ref 30.0–36.0)
MCV: 82.2 fL (ref 78.0–100.0)
Platelets: 257 10*3/uL (ref 150–400)
RBC: 5.62 MIL/uL (ref 4.22–5.81)
RDW: 14.9 % (ref 11.5–15.5)
WBC: 6.8 10*3/uL (ref 4.0–10.5)

## 2015-11-15 LAB — BASIC METABOLIC PANEL
Anion gap: 8 (ref 5–15)
BUN: 13 mg/dL (ref 6–20)
CO2: 25 mmol/L (ref 22–32)
Calcium: 9.6 mg/dL (ref 8.9–10.3)
Chloride: 104 mmol/L (ref 101–111)
Creatinine, Ser: 0.76 mg/dL (ref 0.61–1.24)
GFR calc Af Amer: >60 mL/min (ref >60)
GFR calc non Af Amer: >60 mL/min (ref >60)
Glucose, Bld: 211 mg/dL — ABNORMAL HIGH (ref 65–99)
Potassium: 4.1 mmol/L (ref 3.5–5.1)
Sodium: 137 mmol/L (ref 135–145)

## 2015-11-15 LAB — TYPE AND SCREEN
ABO/RH(D): O POS
Antibody Screen: NEGATIVE

## 2015-11-15 LAB — ABO/RH: ABO/RH(D): O POS

## 2015-11-15 SURGERY — ARTHROSCOPY, KNEE
Anesthesia: General | Laterality: Left

## 2015-11-15 MED ORDER — MEPERIDINE HCL 50 MG/ML IJ SOLN: 6.2500 mg | INTRAMUSCULAR | Status: DC | PRN

## 2015-11-15 MED ORDER — LACTATED RINGERS IR SOLN
Status: DC | PRN
  Administered 2015-11-15: 6000 mL

## 2015-11-15 MED ORDER — METOCLOPRAMIDE HCL 5 MG/ML IJ SOLN: 10.0000 mg | Freq: Once | INTRAMUSCULAR | Status: DC | PRN

## 2015-11-15 MED ORDER — FENTANYL CITRATE (PF) 100 MCG/2ML IJ SOLN
INTRAMUSCULAR | Status: AC
  Filled 2015-11-15: qty 2

## 2015-11-15 MED ORDER — DEXAMETHASONE SODIUM PHOSPHATE 10 MG/ML IJ SOLN
INTRAMUSCULAR | Status: AC
  Filled 2015-11-15: qty 1

## 2015-11-15 MED ORDER — MIDAZOLAM HCL 2 MG/2ML IJ SOLN
INTRAMUSCULAR | Status: AC
  Filled 2015-11-15: qty 2

## 2015-11-15 MED ORDER — MIDAZOLAM HCL 5 MG/5ML IJ SOLN
INTRAMUSCULAR | Status: DC | PRN
  Administered 2015-11-15 (×2): 1 mg via INTRAVENOUS

## 2015-11-15 MED ORDER — FENTANYL CITRATE (PF) 100 MCG/2ML IJ SOLN
INTRAMUSCULAR | Status: DC | PRN
  Administered 2015-11-15 (×4): 50 ug via INTRAVENOUS

## 2015-11-15 MED ORDER — DEXAMETHASONE SODIUM PHOSPHATE 10 MG/ML IJ SOLN
INTRAMUSCULAR | Status: DC | PRN
  Administered 2015-11-15: 10 mg via INTRAVENOUS

## 2015-11-15 MED ORDER — HYDROCODONE-ACETAMINOPHEN 5-325 MG PO TABS: 1.0000 | ORAL_TABLET | Freq: Four times a day (QID) | ORAL | 0 refills | Status: DC | PRN

## 2015-11-15 MED ORDER — ONDANSETRON HCL 4 MG/2ML IJ SOLN
INTRAMUSCULAR | Status: DC | PRN
  Administered 2015-11-15: 4 mg via INTRAVENOUS

## 2015-11-15 MED ORDER — FENTANYL CITRATE (PF) 100 MCG/2ML IJ SOLN
INTRAMUSCULAR | Status: AC
  Administered 2015-11-15: 50 ug via INTRAVENOUS
  Filled 2015-11-15: qty 2

## 2015-11-15 MED ORDER — BUPIVACAINE HCL (PF) 0.25 % IJ SOLN
INTRAMUSCULAR | Status: DC | PRN
Start: 2015-11-15 — End: 2015-11-15
  Administered 2015-11-15: 20 mL

## 2015-11-15 MED ORDER — LACTATED RINGERS IV SOLN
INTRAVENOUS | Status: DC
  Administered 2015-11-15: 13:00:00 via INTRAVENOUS

## 2015-11-15 MED ORDER — LACTATED RINGERS IV SOLN: INTRAVENOUS | Status: DC

## 2015-11-15 MED ORDER — CHLORHEXIDINE GLUCONATE 4 % EX LIQD: 60.0000 mL | Freq: Once | CUTANEOUS | Status: DC

## 2015-11-15 MED ORDER — ACETAMINOPHEN 10 MG/ML IV SOLN
INTRAVENOUS | Status: DC | PRN
  Administered 2015-11-15: 1000 mg via INTRAVENOUS

## 2015-11-15 MED ORDER — LIDOCAINE HCL (CARDIAC) 20 MG/ML IV SOLN
INTRAVENOUS | Status: AC
  Filled 2015-11-15: qty 5

## 2015-11-15 MED ORDER — ACETAMINOPHEN 10 MG/ML IV SOLN
INTRAVENOUS | Status: AC
  Filled 2015-11-15: qty 100

## 2015-11-15 MED ORDER — LIDOCAINE-EPINEPHRINE 1 %-1:100000 IJ SOLN
INTRAMUSCULAR | Status: AC
  Filled 2015-11-15: qty 1

## 2015-11-15 MED ORDER — PROPOFOL 10 MG/ML IV BOLUS
INTRAVENOUS | Status: AC
  Filled 2015-11-15: qty 20

## 2015-11-15 MED ORDER — FENTANYL CITRATE (PF) 100 MCG/2ML IJ SOLN
25.0000 ug | INTRAMUSCULAR | Status: DC | PRN
  Administered 2015-11-15 (×2): 50 ug via INTRAVENOUS

## 2015-11-15 MED ORDER — METHOCARBAMOL 1000 MG/10ML IJ SOLN
500.0000 mg | Freq: Once | INTRAVENOUS | Status: AC
  Administered 2015-11-15: 500 mg via INTRAVENOUS
  Filled 2015-11-15: qty 550

## 2015-11-15 MED ORDER — LIDOCAINE HCL (CARDIAC) 10 MG/ML IV SOLN
INTRAVENOUS | Status: DC | PRN
  Administered 2015-11-15: 100 mg via INTRAVENOUS

## 2015-11-15 MED ORDER — MELOXICAM 7.5 MG PO TABS: 15.0000 mg | ORAL_TABLET | Freq: Every day | ORAL | 1 refills | Status: DC

## 2015-11-15 MED ORDER — PROPOFOL 10 MG/ML IV BOLUS
INTRAVENOUS | Status: DC | PRN
  Administered 2015-11-15: 200 mg via INTRAVENOUS

## 2015-11-15 MED ORDER — BUPIVACAINE HCL (PF) 0.25 % IJ SOLN
INTRAMUSCULAR | Status: AC
  Filled 2015-11-15: qty 30

## 2015-11-15 MED ORDER — LIDOCAINE-EPINEPHRINE 1 %-1:100000 IJ SOLN
INTRAMUSCULAR | Status: DC | PRN
  Administered 2015-11-15: 20 mL

## 2015-11-15 SURGICAL SUPPLY — 30 items
BANDAGE ACE 6X5 VEL STRL LF (GAUZE/BANDAGES/DRESSINGS) ×4 IMPLANT
BLADE CUDA SHAVER 3.5 (BLADE) ×2 IMPLANT
BLADE SURG SZ11 CARB STEEL (BLADE) IMPLANT
CLOTH BEACON ORANGE TIMEOUT ST (SAFETY) ×2 IMPLANT
COUNTER NEEDLE 20 DBL MAG RED (NEEDLE) ×2 IMPLANT
COVER SURGICAL LIGHT HANDLE (MISCELLANEOUS) ×2 IMPLANT
DRAPE LEGGING IMPERMEABLE (DRAPES) ×2 IMPLANT
DRAPE U-SHAPE 47X51 STRL (DRAPES) IMPLANT
DRSG EMULSION OIL 3X3 NADH (GAUZE/BANDAGES/DRESSINGS) ×2 IMPLANT
DRSG PAD ABDOMINAL 8X10 ST (GAUZE/BANDAGES/DRESSINGS) ×4 IMPLANT
DURAPREP 26ML APPLICATOR (WOUND CARE) ×2 IMPLANT
GAUZE SPONGE 4X4 12PLY STRL (GAUZE/BANDAGES/DRESSINGS) ×2 IMPLANT
GAUZE SPONGE 4X4 16PLY XRAY LF (GAUZE/BANDAGES/DRESSINGS) ×2 IMPLANT
GLOVE BIOGEL M 7.0 STRL (GLOVE) IMPLANT
GLOVE BIOGEL PI IND STRL 7.5 (GLOVE) ×1 IMPLANT
GLOVE BIOGEL PI INDICATOR 7.5 (GLOVE) ×1
GLOVE ORTHO TXT STRL SZ7.5 (GLOVE) ×2 IMPLANT
GOWN STRL REUS W/TWL LRG LVL3 (GOWN DISPOSABLE) ×2 IMPLANT
GOWN STRL REUS W/TWL XL LVL3 (GOWN DISPOSABLE) IMPLANT
KIT BASIN OR (CUSTOM PROCEDURE TRAY) ×2 IMPLANT
MANIFOLD NEPTUNE II (INSTRUMENTS) ×2 IMPLANT
MARKER PEN SURG W/LABELS BLK (STERILIZATION PRODUCTS) ×2 IMPLANT
PACK ARTHROSCOPY WL (CUSTOM PROCEDURE TRAY) ×2 IMPLANT
PAD MASON LEG HOLDER (PIN) ×2 IMPLANT
PADDING CAST COTTON 6X4 STRL (CAST SUPPLIES) ×2 IMPLANT
SUT ETHILON 4 0 PS 2 18 (SUTURE) ×2 IMPLANT
SYR 30ML LL (SYRINGE) ×2 IMPLANT
TOWEL OR 17X26 10 PK STRL BLUE (TOWEL DISPOSABLE) ×2 IMPLANT
TUBING ARTHRO INFLOW-ONLY STRL (TUBING) ×2 IMPLANT
WRAP KNEE MAXI GEL POST OP (GAUZE/BANDAGES/DRESSINGS) ×2 IMPLANT

## 2015-11-15 NOTE — Anesthesia Preprocedure Evaluation (Signed)
Anesthesia Evaluation  Patient identified by MRN, date of birth, ID band Patient awake    Reviewed: Allergy & Precautions, NPO status , Patient's Chart, lab work & pertinent test results  Airway Mallampati: II  TM Distance: >3 FB Neck ROM: Full    Dental no notable dental hx.    Pulmonary asthma , sleep apnea and Continuous Positive Airway Pressure Ventilation , Current Smoker,    Pulmonary exam normal breath sounds clear to auscultation       Cardiovascular hypertension, Normal cardiovascular exam Rhythm:Regular Rate:Normal     Neuro/Psych negative neurological ROS  negative psych ROS   GI/Hepatic Neg liver ROS, GERD  Medicated and Controlled,  Endo/Other  Morbid obesity  Renal/GU negative Renal ROS  negative genitourinary   Musculoskeletal negative musculoskeletal ROS (+)   Abdominal   Peds negative pediatric ROS (+)  Hematology negative hematology ROS (+)   Anesthesia Other Findings   Reproductive/Obstetrics negative OB ROS                             Anesthesia Physical Anesthesia Plan  ASA: III  Anesthesia Plan: General   Post-op Pain Management:    Induction: Intravenous  Airway Management Planned: LMA  Additional Equipment:   Intra-op Plan:   Post-operative Plan: Extubation in OR  Informed Consent: I have reviewed the patients History and Physical, chart, labs and discussed the procedure including the risks, benefits and alternatives for the proposed anesthesia with the patient or authorized representative who has indicated his/her understanding and acceptance.   Dental advisory given  Plan Discussed with: CRNA  Anesthesia Plan Comments:         Anesthesia Quick Evaluation

## 2015-11-15 NOTE — Transfer of Care (Signed)
Immediate Anesthesia Transfer of Care Note  Patient: [name]  Procedure(s) Performed: Procedure(s): ARTHROSCOPY KNEE AND MEDIAL MENISECTOMY (Left)  Patient Location: PACU  Anesthesia Type:General  Level of Consciousness: awake, alert , oriented and patient cooperative  Airway & Oxygen Therapy: Patient Spontanous Breathing and Patient connected to face mask oxygen  Post-op Assessment: Report given to RN, Post -op Vital signs reviewed and stable and Patient moving all extremities X 4  Post vital signs: stable  Last Vitals:  Vitals:   11/15/15 1200 11/15/15 1612  BP: (!) 144/84 129/82  Pulse: 91 89  Resp: 20 16  Temp: 37 C 36.6 C    Last Pain:  Vitals:   11/15/15 1612  TempSrc:   PainSc: 4       Patients Stated Pain Goal: 4 ([id number])  Complications: No apparent anesthesia complications

## 2015-11-15 NOTE — Anesthesia Postprocedure Evaluation (Signed)
Anesthesia Post Note  Patient: Kenneth [name]  Procedure(s) Performed: Procedure(s) (LRB): ARTHROSCOPY KNEE AND MEDIAL MENISECTOMY (Left)  Patient location during evaluation: PACU Anesthesia Type: General Level of consciousness: awake and alert Pain management: pain level controlled Vital Signs Assessment: post-procedure vital signs reviewed and stable Respiratory status: spontaneous breathing, nonlabored ventilation, respiratory function stable and patient connected to nasal cannula oxygen Cardiovascular status: blood pressure returned to baseline and stable Postop Assessment: no signs of nausea or vomiting Anesthetic complications: no    Last Vitals:  Vitals:   11/15/15 1615 11/15/15 1630  BP: (!) 141/88 (!) 167/84  Pulse: 88 80  Resp: (!) 23 14  Temp:      Last Pain:  Vitals:   11/15/15 1612  TempSrc:   PainSc: 4                  [name]

## 2015-11-15 NOTE — Anesthesia Procedure Notes (Signed)
Procedure Name: LMA Insertion Date/Time: 11/15/2015 3:25 PM Performed by: Enrigue Catena E Pre-anesthesia Checklist: Patient identified, Emergency Drugs available, Suction available and Patient being monitored Patient Re-evaluated:Patient Re-evaluated prior to inductionOxygen Delivery Method: Circle system utilized Preoxygenation: Pre-oxygenation with 100% oxygen Intubation Type: IV induction Ventilation: Mask ventilation without difficulty LMA: LMA with gastric port inserted LMA Size: 5.0 Tube type: Oral Number of attempts: 1 Placement Confirmation: positive ETCO2 Tube secured with: Tape Dental Injury: Teeth and Oropharynx as per pre-operative assessment

## 2015-11-15 NOTE — H&P (Signed)
CC- AVAAN VINK is a 55 y.o. male who presents with left knee pain.  HPI- . Knee Pain: Patient presents with knee pain involving the  left knee. Onset of the symptoms was several months ago. Inciting event: none known. Current symptoms include giving out, locking and pain located medially. Pain is aggravated by any weight bearing.  Patient has had no prior knee problems. Evaluation to date: plain films: normal and MRI: abnormal with MMT. Treatment to date: avoidance of offending activity, corticosteroid injection which was somewhat effective and prescription NSAIDS which are not very effective.  Past Medical History:  Diagnosis Date  . Arthritis   . Asthma   . Chronic bronchitis ([place])   . GERD (gastroesophageal reflux disease)   . Hypercholesteremia   . Hypertension   . OSA treated with BiPAP    [id number], bipap 23/19    Past Surgical History:  Procedure Laterality Date  . ESOPHAGOGASTRODUODENOSCOPY (EGD) WITH PROPOFOL N/A 12/11/2012   Procedure: ESOPHAGOGASTRODUODENOSCOPY (EGD) WITH PROPOFOL;  Surgeon: Arta Silence, MD;  Location: [facility];  Service: Endoscopy;  Laterality: N/A;  . FINGER AMPUTATION      Prior to Admission medications   Medication Sig Start Date End Date Taking? Authorizing Provider  furosemide (LASIX) 20 MG tablet Take 1 tablet (20 mg total) by mouth daily. 03/01/15  Yes Alycia Rossetti, MD  lansoprazole (PREVACID) 30 MG capsule TAKE 1 CAPSULE BY MOUTH DAILY AT 12:00 NOON. 07/16/14  Yes Orlena Sheldon, PA-C  aspirin 81 MG tablet Take 81 mg by mouth daily.    Historical Provider, MD  colchicine 0.6 MG tablet Take 2 pills immediately and then repeat in 1 hour if pain persists. 03/15/15   [name], MD  diclofenac (VOLTAREN) 75 MG EC tablet TAKE 1 TABLET (75 MG TOTAL) BY MOUTH TWICE DAILY. 09/23/15   [name], MD  docusate sodium (COLACE) 100 MG capsule Take 100 mg by mouth 2 (two) times daily.    Historical Provider, MD  HYDROcodone-acetaminophen  (NORCO) 5-325 MG tablet Take 1 tablet by mouth every 6 (six) hours as needed for moderate pain. 08/17/15   [name], MD  hydrocortisone (PROCTOSOL HC) 2.5 % rectal cream Place 1 application rectally 2 (two) times daily.    Historical Provider, MD  ketoconazole (NIZORAL) 2 % cream Apply 1 application topically daily. Apply to affected area twice daily. 07/13/15   Max T Hyatt, DPM  nitroGLYCERIN (NITROSTAT) 0.4 MG SL tablet Place 1 tablet (0.4 mg total) under the tongue every 5 (five) minutes as needed. For chest pain 03/01/15   Alycia Rossetti, MD  oxyCODONE-acetaminophen (ROXICET) 5-325 MG tablet Take 1 tablet by mouth every 8 (eight) hours as needed for severe pain. 09/07/15   [name], MD  PROAIR HFA 108 (90 BASE) MCG/ACT inhaler INHALE 2 PUFFS INTO THE LUNGS EVERY 4 HOURS AS NEEDED FOR SHORTNESS OF BREATH 03/12/14   Orlena Sheldon, PA-C    soft tissue tenderness over medial joint line, effusion, reduced range of motion, collateral ligaments intact, normal ipsilateral hip exam, normal contralateral knee exam  Physical Examination: General appearance - alert, well appearing, and in no distress Chest - clear to auscultation, no wheezes, rales or rhonchi, symmetric air entry Heart - normal rate and regular rhythm Abdomen - soft, nontender, nondistended, no masses or organomegaly Musculoskeletal - see above Extremities - peripheral pulses normal, no pedal edema, no clubbing or cyanosis Skin - normal coloration and turgor, no rashes, no suspicious  skin lesions noted   Asessment/Plan--- Left knee medial meniscal tear- - Plan left knee arthroscopy with meniscal debridement. Procedure risks and potential comps discussed with patient who elects to proceed. Goals are decreased pain and increased function with a high likelihood of achieving both

## 2015-11-15 NOTE — Op Note (Signed)
Kenneth [name], ARD NO.:  [id number]  MEDICAL RECORD NO.:  [id number]  LOCATION:  [facility]                         FACILITY:  [facility]  PHYSICIAN:  Pietro Cassis. Alvan Dame, M.D.  DATE OF BIRTH:  28-May-1960  DATE OF PROCEDURE:  11/15/2015 DATE OF DISCHARGE:  11/15/2015                              OPERATIVE REPORT   PREOPERATIVE DIAGNOSIS:  Left knee medial meniscal tear with probable lateral meniscal tear.  POSTOPERATIVE DIAGNOSES/FINDINGS: 1. Complex tearing to the posterior horn to midbody region of the     medial meniscus. 2. Central tearing to the anterior horn and midbody of the lateral     meniscus.  PROCEDURE:  Left knee diagnostic and operative arthroscopy with medial and lateral partial meniscectomies.  SURGEON:  Pietro Cassis. Alvan Dame, M.D.  ASSISTANT:  Surgical team.  ANESTHESIA:  General anesthetic plus local anesthetic.  COMPLICATIONS:  None.  DRAINS:  None.  BLOOD LOSS:  Minimal.  INDICATIONS FOR PROCEDURE:  Kenneth [name] is a 55 year old male, who presented to the office for evaluation of recurrent left knee symptoms. He had a mechanical catching and pain in his knee that was unresponsive to conservative treatment of activity, modification, rest and cortisone injection.  MRI had been ordered, which confirmed suspicion for meniscal tear with complex tear of the medial meniscus, identified as well as potential lateral meniscal tearing.  We reviewed the risks and benefits of continued conservative care versus operative intervention based on the persistence of his symptoms.  He, at this point, wished to proceed with surgical intervention, risks of infection and DVT, reviewed the risks of persistent pain, risk of potential progression of the meniscus tear or progressive degenerative changes were reviewed.  Consent was obtained for benefit of pain relief.  PROCEDURE IN DETAIL:  The patient was brought to the operative theater. Once adequate anesthesia,  preoperative antibiotics, Ancef administered, he was positioned supine with his left leg in a leg holder.  We positioned his right leg into a leg stirrup due to his overall size.  Once positioned appropriately safely, the left lower extremity was then prepped and draped in sterile fashion.  Time-out was performed identifying the patient, planned procedure and extremity.  Standard inferior-medial, superior-medial, inferior-lateral portals were utilized.  Diagnostic evaluation of the knee revealed the above findings.  The inferior medial portal was utilized as a sole working portal.  First after examination of the meniscus with the probe, I used a combination of the straight-biting basket and a 3.5 Cuda shaver to debride the meniscus back to a stable tear.  He had a radial tear of this posterior horn that was debrided back as well as cleavage tearing to both sides of it.  The straight-biting basket was used to debride the meniscus back to a stable level as well as stabilize the leaflets.  The 3.5 Cuda shaver was then inserted into the removed meniscal fragments each time and then blended and contoured remaining meniscus back to a stable central to peripheral region.  This was splinted into the midbody segment.  There was minimal-to-no just significant degenerative changes of the medial compartment of the knee.  Perhaps some thinning to the tibial plateau surface,  but no exposed bone.  As we then evaluated the lateral compartment of the knee, the anterior cruciate ligament was noted to be intact.  The lateral meniscus was identified by the MRI, some tearing to the anterior horn and midbody of the junction.  This was then debrided using a 3.5 Cuda shaver.  Once this was debrided, again noting minimal degenerative changes perhaps in the tibial plateau region, we re-examined the patellofemoral cartilage, noted it was intact.  Other than little bit of synovitic changes in his joint,  there was nothing that required any formal debridement.  Once the above-stated procedure was completed, the knee was re-examined and actually removed an other floating piece of meniscus in the knee upon this examination.  The instrumentation was removed from the knee.  Portal sites were reapproximated using 4-0 nylon.  The knee was injected at the end of the case with 0.25% Marcaine.  The knee was then wrapped into a sterile bulky Jones dressing.  He was brought to the recovery room in stable condition, tolerating the procedure well. Findings were reviewed with family.  He will be given prescription for walker.  He can be weightbearing as tolerated.  He can use ice on the knee 20-30 minutes an hour to help with swelling and pain.  Pain prescriptions provided.     Pietro Cassis Alvan Dame, M.D.     MDO/MEDQ  D:  11/15/2015  T:  11/15/2015  Job:  [id number]

## 2015-11-15 NOTE — Brief Op Note (Signed)
11/15/2015  4:38 PM  PATIENT:  Kenneth [name]  55 y.o. male  PRE-OPERATIVE DIAGNOSIS:  LEFT KNEE MEDIAL MENISCUS TEAR  POST-OPERATIVE DIAGNOSIS:  LEFT KNEE complex MEDIAL MENISCUS TEAR, 2. Central tearing to AH, MB lateral meniscus  PROCEDURE:  Procedure(s): ARTHROSCOPY left  KNEE with MEDIAL and lateral partial MENISCECTOMIES(Left)  SURGEON:  Surgeon(s) and Role:    * Paralee Cancel, MD - Primary  PHYSICIAN ASSISTANT: None  ANESTHESIA:   local and general  EBL:  Total I/O In: 1000 [I.V.:1000] Out: 50 [Blood:50]  BLOOD ADMINISTERED:none  DRAINS: none   LOCAL MEDICATIONS USED:  MARCAINE    and LIDOCAINE   SPECIMEN:  No Specimen  DISPOSITION OF SPECIMEN:  N/A  COUNTS:  YES  TOURNIQUET:  * No tourniquets in log *  DICTATION: .Other Dictation: Dictation Number [id number]  PLAN OF CARE: Discharge to home after PACU  PATIENT DISPOSITION:  PACU - hemodynamically stable.   Delay start of Pharmacological VTE agent (>24hrs) due to surgical blood loss or risk of bleeding: no

## 2015-11-15 NOTE — Discharge Instructions (Signed)

## 2015-11-16 ENCOUNTER — Encounter (HOSPITAL_COMMUNITY): Payer: Self-pay | Admitting: Orthopedic Surgery

## 2015-12-28 DIAGNOSIS — M25562 Pain in left knee: Secondary | ICD-10-CM | POA: Diagnosis not present

## 2016-01-07 DIAGNOSIS — M25562 Pain in left knee: Secondary | ICD-10-CM | POA: Diagnosis not present

## 2016-01-10 DIAGNOSIS — M25562 Pain in left knee: Secondary | ICD-10-CM | POA: Diagnosis not present

## 2016-01-13 DIAGNOSIS — M25562 Pain in left knee: Secondary | ICD-10-CM | POA: Diagnosis not present

## 2016-01-16 ENCOUNTER — Encounter: Payer: Self-pay | Admitting: Neurology

## 2016-01-18 ENCOUNTER — Encounter: Payer: Self-pay | Admitting: Neurology

## 2016-01-18 ENCOUNTER — Ambulatory Visit (INDEPENDENT_AMBULATORY_CARE_PROVIDER_SITE_OTHER): Payer: Medicare Other | Admitting: Neurology

## 2016-01-18 VITALS — BP 122/66 | HR 92 | Resp 20 | Ht 66.0 in | Wt 295.0 lb

## 2016-01-18 DIAGNOSIS — G4734 Idiopathic sleep related nonobstructive alveolar hypoventilation: Secondary | ICD-10-CM

## 2016-01-18 DIAGNOSIS — G4733 Obstructive sleep apnea (adult) (pediatric): Secondary | ICD-10-CM | POA: Diagnosis not present

## 2016-01-18 DIAGNOSIS — Z7722 Contact with and (suspected) exposure to environmental tobacco smoke (acute) (chronic): Secondary | ICD-10-CM

## 2016-01-18 DIAGNOSIS — Z7729 Contact with and (suspected ) exposure to other hazardous substances: Secondary | ICD-10-CM

## 2016-01-18 NOTE — Patient Instructions (Addendum)
You are not fully compliant with your BiPAP. Please recall, you have SEVERE obstructive sleep apnea and need to be treated every night and every time you plan to sleep with your BiPAP. When you use it her sleep apnea is under control completely. Please remember, the risks and ramifications of moderate to severe obstructive sleep apnea or OSA are: Cardiovascular disease, including congestive heart failure, stroke, difficult to control hypertension, arrhythmias, and even type 2 diabetes has been linked to untreated OSA. Sleep apnea causes disruption of sleep and sleep deprivation in most cases, which, in turn, can cause recurrent headaches, problems with memory, mood, concentration, focus, and vigilance. Most people with untreated sleep apnea report excessive daytime sleepiness, which can affect their ability to drive. Please do not drive if you feel sleepy.  PLEASE STOP SMOKING. GOOD JOB WITH TRYING TO LOSE WEIGHT, KEEP WORKING ON IT!  We can see you back in 4 months, you can see one of our nurse practitioners at the time and I will see you after that.

## 2016-01-18 NOTE — Progress Notes (Signed)
Subjective:    Patient ID: Kenneth [name] is a 55 y.o. male.  HPI     Interim history:   Kenneth [name] is a 55 year old right-handed gentleman with an underlying medical history of hyperlipidemia, coronary artery disease, reflux disease, morbid obesity, arthritis, chronic bronchitis in the setting of smoking, obesity hypoventilation syndrome and syncopal event, who presents for follow-up consultation of his obstructive sleep apnea, on BiPAP therapy. The patient is unaccompanied today. I last saw him on 05/10/2015, at which time he was not fully compliant with BiPAP therapy. He reported needing new supplies. He was still smoking. He had lost about 10 pounds in 12 months. I asked him to be fully compliant with BiPAP therapy.: He reports needing new supplies. He admits to not using his machine very much. Has no issues using it though. He sleeps better with it, he admits. He still smokes. He has lost a little bit of weight. Between February 2016 and February this year, he has lost about 10 pounds. We talked about his BiPAP titration study from 10/15/2014.  Today, 01/18/2016: I reviewed his BiPAP compliance data from 12/18/2015 through 01/16/2016, which is a total of 30 days, during which time he used his BiPAP only 18 days with percent used days greater than 4 hours at 23%, indicating poor compliance with an average daily usage of only 2 hours and 15 minutes, residual AHI 1 per hour, leak low with the 95th percentile at 7.7 L/m on a pressure of 23/19 cm. In the past 90 days his compliance percentage was 24%.  Today, 01/18/2016: He reports doing okay with BiPAP, just received supplies. He had recent arthroscopic knee surgery on the left secondary to meniscus tear under Dr. Alvan Dame on 11/15/2015. He admits to skipping nights, no clear reason, still smokes, has not cut back. Is in PT 2/week, Takes hydrocodone as needed, tries not to take it every day.   Previously:   I saw him on 07/30/2014, at which time we  talked about his auto BiPAP therapy. He was compliant with treatment but I suggested we bring him back for a full night titration study to optimize treatment setting and determine a set pressure. He had a CPAP titration study on 08/04/2014, this was followed by a BiPAP titration study on 10/15/2014. I went over his test results with him in detail today. His CPAP titration study from 08/04/2014 showed that his sleep apnea was not appropriately treated with CPAP. He was then switched to BiPAP therapy but an optimal pressure was not determined during that study. CPAP was titrated from 5 cm to 16 cm but his AHI was 20 per hour while on CPAP. BiPAP was started at 18/14 and titrated to 22/18 but he had ongoing respiratory events, a brief trial of BiPAP ST was not successful during that study. Sleep efficiency was 79.4%. Sleep latency 7 minutes, wake after sleep onset was 89 minutes with mild to moderate sleep fragmentation noted. Average oxygen saturation was 92%, nadir was 83%. Based on unsuccessful treatment with CPAP therapy and suboptimal treatment with BiPAP therapy during the study I asked him to return for another full night BiPAP titration study. He had this on 10/15/2014. Sleep efficiency was 83.8%, latency to sleep 6 minutes, wake after sleep onset was 63 minutes with moderate sleep fragmentation noted. He had an elevated arousal index. He had increased percentages of stage I and stage II sleep, absence of slow-wave sleep and a REM percentage of 17.9% with a reduced REM latency of  24 minutes. He had mild PLMS with an index of 10.2 per hour, resulting in 3.9 arousals per hour. BiPAP was started at 8 over 4 cm and titrated to 23/19 cm. AHI was 0 per hour at that pressure. Supplemental oxygen was not utilized. On the final pressure, his desaturation nadir was 90%, supine REM sleep was achieved during the study but not on the final pressure. He indicated that he slept better than usual. Based on his test results are  prescribed BiPAP therapy for home use with a setting of 29/19 cm via full facemask.   I reviewed his BiPAP compliance data from 04/05/2015 through 05/04/2015 which is a total of 30 days during which time he used his machine only 8 days with percent used days greater than 4 hours at 13% only, indicating poor compliance with an average usage of 1 hour and 17 minutes only. Residual AHI 3.1 per hour, leak low. Set pressure of 23/19 cm.   I first met him on 05/22/2014 at the request of his primary care physician, at which time the patient reported snoring and daytime somnolence. I suggested he return for sleep study. He had a split-night sleep study on 05/25/2014 and went over his test results with him in detail today. Baseline sleep efficiency was 77.3% with a latency to sleep of 0 minutes and wake after sleep onset of 35.5 minutes with severe sleep fragmentation noted. He had an elevated arousal index. He had absence of slow-wave sleep and absence of REM sleep prior to CPAP. He had had rare pauses on his EKG, about 3 seconds long. He had moderate to loud snoring. His total AHI was 119 per hour. This line oxygen saturation was 89%, nadir was 76%. He was then titrated on CPAP from 5-15 cm and then placed on BiPAP therapy and titrated from 16/12 cm to 24/20 cm. He did not have resolution of his sleep disordered breathing while on CPAP or BiPAP standard. I suggested Auto BiPAP at home and also suggested he undergo pulse oximetry testing while on BiPAP therapy at home.    I reviewed his BiPAP compliance data from 06/28/2014 through 07/27/2014 which is a total of 30 days during which time he used his machine only 22 days. Percent used days greater than 4 hours was 70%, indicating borderline/adequate compliance. Average usage for all nights was 4 hours and 54 minutes. He is on BiPAP with maximum IPAP of 25 cm, minimum EPAP of 14 cm and pressure support of 4. Average AHI was 3.3 per hour, adequate. Leak was acceptable  with the 95th percentile at 13.1 L/m. 95th percentile of pressure appears to be 21/17.   I spoke with his cardiologist on the phone on 05/19/14; the patient was having some 5-6 s pauses. His Holter monitor was placed earlier this month. On 04/30/2014 he had a syncopal spell. He was in the emergency room for this. He has seen Dr. Percival Spanish for cardiac workup. He has been advised to have a sleep study. He snores, he wakes up not refreshed and he has multiple nighttime awakenings and nocturia at least 4-5 times per night. His Epworth sleepiness score is 19 out of 24. He goes to bed around 9 PM and while he falls asleep fairly quickly he wakes up almost every hour he says. Sometimes he gets out of bed around 3 AM and never goes back to sleep. He denies restless leg symptoms but has some aching in his legs. He has lower extremity swelling. He wakes  up with a headache at times. He is not aware of any family history of obstructive sleep apnea. He has never had a sleep study. He has been overweight for years. He snores and this is at times quite loud. [name] not sure if he actually quits breathing in his sleep but has woken up with a sense of gasping for air. He drinks about 2 Coca-Cola as per day. He does not drink coffee. He occasionally drinks alcohol. He smokes one pack per day.   He lives with his brother and his sister-in-law. He is divorced. He does not have any children. There are no pets in his bed. Occasionally he watches TV in bed. He does not work.    His Past Medical History Is Significant For: Past Medical History:  Diagnosis Date  . Arthritis   . Asthma   . Chronic bronchitis ([place])   . GERD (gastroesophageal reflux disease)   . Hypercholesteremia   . Hypertension   . OSA treated with BiPAP    ahi-132, bipap 23/19    His Past Surgical History Is Significant For: Past Surgical History:  Procedure Laterality Date  . ESOPHAGOGASTRODUODENOSCOPY (EGD) WITH PROPOFOL N/A 12/11/2012   Procedure:  ESOPHAGOGASTRODUODENOSCOPY (EGD) WITH PROPOFOL;  Surgeon: Arta Silence, MD;  Location: [facility];  Service: Endoscopy;  Laterality: N/A;  . FINGER AMPUTATION    . KNEE ARTHROSCOPY Left 11/15/2015   Procedure: ARTHROSCOPY KNEE AND MEDIAL MENISECTOMY;  Surgeon: Paralee Cancel, MD;  Location: [facility];  Service: Orthopedics;  Laterality: Left;    His Family History Is Significant For: Family History  Problem Relation Age of Onset  . Hypertension Father   . Diabetes Father   . Diabetes      Multiple family members  . Diabetes Sister   . Diabetes Brother   . Stroke Sister     His Social History Is Significant For: Social History   Social History  . Marital status: Divorced    Spouse name: N/A  . Number of children: 0  . Years of education: N/A   Social History Main Topics  . Smoking status: Current Every Day Smoker    Packs/day: 1.00    Years: 40.00    Types: Cigarettes  . Smokeless tobacco: Never Used  . Alcohol use 0.0 oz/week     Comment: some  . Drug use:     Types: Cocaine     Comment: patient states none- read lab report 09/02/2012/ 11-20-12 case cx.due to positive screen test-pt. advised no recreational drugs to be used  . Sexual activity: Not Asked   Other Topics Concern  . None   Social History Narrative   Lives with brother and his sister in [place].      His Allergies Are:  No Known Allergies:   His Current Medications Are:  Outpatient Encounter Prescriptions as of 01/18/2016  Medication Sig  . aspirin 81 MG tablet Take 81 mg by mouth daily.  . colchicine 0.6 MG tablet Take 2 pills immediately and then repeat in 1 hour if pain persists.  . docusate sodium (COLACE) 100 MG capsule Take 100 mg by mouth 2 (two) times daily.  . furosemide (LASIX) 20 MG tablet Take 1 tablet (20 mg total) by mouth daily.  Marland Kitchen HYDROcodone-acetaminophen (NORCO) 5-325 MG tablet Take 1-2 tablets by mouth every 6 (six) hours as needed.  . hydrocortisone (PROCTOSOL HC) 2.5 % rectal cream  Place 1 application rectally 2 (two) times daily.  Marland Kitchen ketoconazole (NIZORAL) 2 % cream Apply  1 application topically daily. Apply to affected area twice daily.  . lansoprazole (PREVACID) 30 MG capsule TAKE 1 CAPSULE BY MOUTH DAILY AT 12:00 NOON.  . meloxicam (MOBIC) 7.5 MG tablet Take 2 tablets (15 mg total) by mouth daily. Take daily for first 7-10 days then as needed for pain and swelling  . nitroGLYCERIN (NITROSTAT) 0.4 MG SL tablet Place 1 tablet (0.4 mg total) under the tongue every 5 (five) minutes as needed. For chest pain  . PROAIR HFA 108 (90 BASE) MCG/ACT inhaler INHALE 2 PUFFS INTO THE LUNGS EVERY 4 HOURS AS NEEDED FOR SHORTNESS OF BREATH   Facility-Administered Encounter Medications as of 01/18/2016  Medication  . lactated ringers infusion  :  Review of Systems:  Out of a complete 14 point review of systems, all are reviewed and negative with the exception of these symptoms as listed below: Review of Systems  Neurological:       Patient states that he [name] doing well on BiPAP. Just received new mask. No new concerns.     Objective:  Neurologic Exam  Physical Exam Physical Examination:   Vitals:   01/18/16 1014  BP: 122/66  Pulse: 92  Resp: 20   General Examination: The patient is a very pleasant 55 y.o. male in no acute distress. He is morbidly obese. He is adequately groomed.   HEENT: Normocephalic, atraumatic, pupils are equal, round and reactive to light and accommodation. Conjunctivae are dry and irritated appearing, stable. Extraocular tracking is good without limitation to gaze excursion or nystagmus noted. Normal smooth pursuit is noted. Hearing is grossly intact. Face is symmetric with normal facial animation and normal facial sensation. Speech is clear with no dysarthria noted. There is no hypophonia. There is no lip, neck/head, jaw or voice tremor. Neck is supple with full range of passive and active motion. There are no carotid bruits on auscultation. Oropharynx  exam reveals: moderate mouth dryness, adequate dental hygiene with s/p tooth extractions in the b/l lower jaw and L upper jaw, and marked airway crowding, due to large tongue, redundant and thick soft palate, narrow airway entry, and tonsils were not visualized. Mallampati is class III. Tongue protrudes centrally and palate elevates symmetrically.    Chest: Clear to auscultation without wheezing, rhonchi or crackles noted.  Heart: S1+S2+0, regular and normal without murmurs, rubs or gallops noted.   Abdomen: Soft, non-tender and non-distended with normal bowel sounds appreciated on auscultation.  Extremities: There is trace pitting edema in the distal lower extremities bilaterally, better. Chronic discoloration are seen.   Skin: Dry appearing especially in the distal lower extremities. There are no varicose veins.  Musculoskeletal: exam reveals no obvious joint deformities, tenderness or joint swelling or erythema, with the exception of pain and decreased range of motion of his left knee.   Neurologically:  Mental status: The patient is awake, alert and oriented in all 4 spheres. His immediate and remote memory, attention, language skills and fund of knowledge are appropriate. There is no evidence of aphasia, agnosia, apraxia or anomia. Speech is clear with normal prosody and enunciation. Thought process is linear. Mood is normal and affect is normal.  Cranial nerves II - XII are as described above under HEENT exam. In addition: shoulder shrug is normal with equal shoulder height noted. Motor exam: Normal bulk, strength and tone is noted. There is no drift, tremor or rebound. Romberg is negative. Reflexes are 1+ throughout, cannot check L knee. Fine motor skills and coordination: intact  in the upper  extremities and left lower extremity but difficulty with the right lower extremity due to pain in his foot. Cerebellar testing: No dysmetria or intention tremor on finger to nose testing. Heel to shin  is difficult on the right and not possible on the L. There is no truncal or gait ataxia.  Sensory exam: intact to light touch in the upper and lower extremities.  Gait, station and balance: He stands with difficulty, reports L knee pain. No veering to one side is noted. No leaning to one side is noted. Posture is age-appropriate and stance is wide-based, walks with significant limp on the L, did not bring a cane.             Assessment and Plan:   In summary, Kenneth [name] is a very pleasant 55 year old male with an underlying complex medical history of hyperlipidemia, coronary artery disease, reflux disease, morbid obesity, arthritis, chronic bronchitis in the setting of smoking, obesity hypoventilation syndrome, syncope (seen by cardiology), who presents for follow-up consultation of his severe obstructive sleep apnea with evidence of significant nocturnal desaturations and nocturnal hypoxemia, in the context of ongoing smoking, and morbid obesity.  I had a long chat with the patient about his sleep study findings from May 2016 as well as July 2016. His original split-night sleep study from 05/25/2014 showed an AHI of 119 per hour (supine AHI was 132/hour), absence of REM sleep and O2 nadir of 76%. He is again advised that he has severe obstructive sleep apnea. He responded reasonably well on BiPAP therapy eventually, on fairly high pressure setting. He has in the past been better with his compliance and currently is poorly compliant. He has no objection to using BiPAP and in fact does endorses that he sleeps better with it as opposed to without it. He is again advised about the sinister complications of untreated severe obstructive sleep apnea, especially with respect to developing cardiovascular disease down the Road, including congestive heart failure, difficult to treat hypertension, cardiac arrhythmias, or stroke. Even type 2 diabetes has, in part, been linked to untreated OSA. Symptoms of untreated OSA  include daytime sleepiness, memory problems, mood irritability and mood disorder such as depression and anxiety, lack of energy, as well as recurrent headaches, especially morning headaches. We talked about smoking cessation and trying to maintain a healthy lifestyle in general, as well as the importance of weight control. He is strongly advised to quit smoking and to try to work on weight loss aggressively, but commended for his weight loss of nearly 20 lb in 6 months. We reviewed his recent compliance data and he is advised to be fully compliant with BiPAP therapy.  His physical exam is stable with the exception of recent surgery to his L knee and residual pain reported, he is strongly advised to avoid narcotic pain medication as much as possible d/t his severe sleep disordered breathing. I suggested a 4 month check up with one of our nurse practitioners and I will see him back after that. I answered all his questions today and the patient was in agreement.  I spent 25 minutes in total face-to-face time with the patient, more than 50% of which was spent in counseling and coordination of care, reviewing test results, reviewing medication and discussing or reviewing the diagnosis of OSA, its prognosis and treatment options.

## 2016-01-19 DIAGNOSIS — M25562 Pain in left knee: Secondary | ICD-10-CM | POA: Diagnosis not present

## 2016-01-24 DIAGNOSIS — M25562 Pain in left knee: Secondary | ICD-10-CM | POA: Diagnosis not present

## 2016-01-26 DIAGNOSIS — M25562 Pain in left knee: Secondary | ICD-10-CM | POA: Diagnosis not present

## 2016-01-28 ENCOUNTER — Encounter: Payer: Self-pay | Admitting: Family Medicine

## 2016-01-28 ENCOUNTER — Ambulatory Visit (INDEPENDENT_AMBULATORY_CARE_PROVIDER_SITE_OTHER): Payer: Medicare Other | Admitting: Family Medicine

## 2016-01-28 VITALS — BP 126/90 | HR 88 | Temp 98.5°F | Resp 22 | Ht 66.0 in | Wt 295.0 lb

## 2016-01-28 DIAGNOSIS — M25561 Pain in right knee: Secondary | ICD-10-CM

## 2016-01-28 DIAGNOSIS — M25562 Pain in left knee: Secondary | ICD-10-CM | POA: Diagnosis not present

## 2016-01-28 DIAGNOSIS — G8929 Other chronic pain: Secondary | ICD-10-CM | POA: Diagnosis not present

## 2016-01-28 DIAGNOSIS — R739 Hyperglycemia, unspecified: Secondary | ICD-10-CM

## 2016-01-28 LAB — CBC WITH DIFFERENTIAL/PLATELET
Basophils Absolute: 60 cells/uL (ref 0–200)
Basophils Relative: 1 %
Eosinophils Absolute: 120 cells/uL (ref 15–500)
Eosinophils Relative: 2 %
HCT: 48.3 % (ref 38.5–50.0)
Hemoglobin: 15.7 g/dL (ref 13.0–17.0)
Lymphocytes Relative: 50 %
Lymphs Abs: 3000 cells/uL (ref 850–3900)
MCH: 26.5 pg — ABNORMAL LOW (ref 27.0–33.0)
MCHC: 32.5 g/dL (ref 32.0–36.0)
MCV: 81.5 fL (ref 80.0–100.0)
MPV: 11.3 fL (ref 7.5–12.5)
Monocytes Absolute: 480 cells/uL (ref 200–950)
Monocytes Relative: 8 %
Neutro Abs: 2340 cells/uL (ref 1500–7800)
Neutrophils Relative %: 39 %
Platelets: 236 10*3/uL (ref 140–400)
RBC: 5.93 MIL/uL — ABNORMAL HIGH (ref 4.20–5.80)
RDW: 14.4 % (ref 11.0–15.0)
WBC: 6 10*3/uL (ref 3.8–10.8)

## 2016-01-28 LAB — COMPLETE METABOLIC PANEL WITH GFR
ALT: 27 U/L (ref 9–46)
AST: 16 U/L (ref 10–35)
Albumin: 4.2 g/dL (ref 3.6–5.1)
Alkaline Phosphatase: 70 U/L (ref 40–115)
BUN: 11 mg/dL (ref 7–25)
CO2: 22 mmol/L (ref 20–31)
Calcium: 9.7 mg/dL (ref 8.6–10.3)
Chloride: 100 mmol/L (ref 98–110)
Creat: 0.76 mg/dL (ref 0.70–1.33)
GFR, Est African American: >89 mL/min (ref >=60)
GFR, Est Non African American: >89 mL/min (ref >=60)
Glucose, Bld: 305 mg/dL — ABNORMAL HIGH (ref 70–99)
Potassium: 4.4 mmol/L (ref 3.5–5.3)
Sodium: 135 mmol/L (ref 135–146)
Total Bilirubin: 0.4 mg/dL (ref 0.2–1.2)
Total Protein: 8 g/dL (ref 6.1–8.1)

## 2016-01-28 LAB — HEMOGLOBIN A1C, FINGERSTICK: Hgb A1C (fingerstick): >14 % — ABNORMAL HIGH (ref <5.7)

## 2016-01-28 LAB — GLUCOSE, FINGERSTICK (STAT): Glucose, fingerstick: 321 mg/dL — ABNORMAL HIGH (ref 70–99)

## 2016-01-28 MED ORDER — METFORMIN HCL 500 MG PO TABS: 500.0000 mg | ORAL_TABLET | Freq: Two times a day (BID) | ORAL | 3 refills | Status: DC

## 2016-01-28 MED ORDER — GLIPIZIDE ER 10 MG PO TB24: 10.0000 mg | ORAL_TABLET | Freq: Every day | ORAL | 2 refills | Status: DC

## 2016-01-28 NOTE — Progress Notes (Signed)
Subjective:    Patient ID: Kenneth [name], male    DOB: 09-10-1960, 55 y.o.   MRN: [id number]  HPI 08/10/15 Patient reports 2-3 months of bilateral knee pain. The pain is worse in his left knee compared to his right knee. The pain is located posteriorly on both sides. He reports pain with standing and with walking. He Moses he climbs on and off of the exam table. He moans when I palpate his knee. There is no laxity to varus or valgus stress bilaterally. He has a negative anterior posterior drawer sign bilaterally however he moans with each test I perform on either knee. There is no erythema or effusion. There is no warmth. He has a negative Apley grind although he does wince in pain with flexion of both knees. Pain seems to be equal on the medial and lateral joint lines. Eyes any specific injury. He has not tried any Tylenol or ibuprofen. He states that he has been taking oxycodone which helps the pain is requesting a refill of oxycodone.  At that time, my plan was: Pain is out of proportion to exam. It is difficult to ascertain the exact source of his pain given the fact he reports pain with every provocative test that I performed. Given his morbid obesity, I suspect that he has osteoarthritis in both knees. I would like to obtain x-rays of both knees to evaluate further. His left knee is the worst. Using sterile technique, I injected the left knee with a mixture of 2 mL of lidocaine, 2 mL of Marcaine, and 2 mL of 40 mg per mL Kenalog. The patient tolerated the procedure well with no complication. I would like him to begin diclofenac 75 mg by mouth twice a day for arthritis. Further treatment pending the results of the x-rays and the performance of a cortisone injection. He also complains of stiffness and tightness in his hamstrings bilaterally. There is no evidence of claudication and he has normal pedal pulses bilaterally. I believe he has hamstring stiffness secondary to an abnormal gait because of his  knee pain. I will not prescribe oxycodone until we have exhausted all conservative options and I explained this to the patient in detail.  08/17/15 X-rays revealed stable mild patellofemoral degenerative changes bilaterally but no severe osteoarthritis, fracture, or dislocation.  I reviewed the x-rays with the patient today in clinic. Joint spaces seem well preserved. There is no explanation on the x-rays the severity of his knee pain. He states that the cortisone injection he had the left knee helped his pain for one or 2 days and the pain came back just as severe. He reports severe pain with standing. There is no erythema or effusion. Pain is out of proportion to exam.  01/28/16 Patient is interested in trying lidocaine cream for his knees. He would like to try 5% cream applied 4 times a day to the knees for pain. I believe this is reasonable. Over the last several weeks, he has experienced polyuria, polydipsia, and blurry vision. He is also lost 17 pounds since I last saw him. His sister has been checking his blood sugars and have found it running between 259 and 399!  Blood sugar here today is 321 confirmed on our machine. Hemoglobin A1c is >14. Past Medical History:  Diagnosis Date  . Arthritis   . Asthma   . Chronic bronchitis ([place])   . GERD (gastroesophageal reflux disease)   . Hypercholesteremia   . Hypertension   . OSA treated  with BiPAP    ahi-132, bipap 23/19   Past Surgical History:  Procedure Laterality Date  . ESOPHAGOGASTRODUODENOSCOPY (EGD) WITH PROPOFOL N/A 12/11/2012   Procedure: ESOPHAGOGASTRODUODENOSCOPY (EGD) WITH PROPOFOL;  Surgeon: Arta Silence, MD;  Location: [facility];  Service: Endoscopy;  Laterality: N/A;  . FINGER AMPUTATION    . KNEE ARTHROSCOPY Left 11/15/2015   Procedure: ARTHROSCOPY KNEE AND MEDIAL MENISECTOMY;  Surgeon: Paralee Cancel, MD;  Location: [facility];  Service: Orthopedics;  Laterality: Left;   Current Outpatient Prescriptions on File Prior to Visit    Medication Sig Dispense Refill  . aspirin 81 MG tablet Take 81 mg by mouth daily.    . colchicine 0.6 MG tablet Take 2 pills immediately and then repeat in 1 hour if pain persists. 10 tablet 0  . docusate sodium (COLACE) 100 MG capsule Take 100 mg by mouth 2 (two) times daily.    . furosemide (LASIX) 20 MG tablet Take 1 tablet (20 mg total) by mouth daily. 30 tablet 5  . lansoprazole (PREVACID) 30 MG capsule TAKE 1 CAPSULE BY MOUTH DAILY AT 12:00 NOON. 90 capsule 1  . meloxicam (MOBIC) 7.5 MG tablet Take 2 tablets (15 mg total) by mouth daily. Take daily for first 7-10 days then as needed for pain and swelling 90 tablet 1  . nitroGLYCERIN (NITROSTAT) 0.4 MG SL tablet Place 1 tablet (0.4 mg total) under the tongue every 5 (five) minutes as needed. For chest pain 20 tablet 1  . PROAIR HFA 108 (90 BASE) MCG/ACT inhaler INHALE 2 PUFFS INTO THE LUNGS EVERY 4 HOURS AS NEEDED FOR SHORTNESS OF BREATH 8.5 g 2   Current Facility-Administered Medications on File Prior to Visit  Medication Dose Route Frequency Provider Last Rate Last Dose  . lactated ringers infusion    Continuous PRN Chyrel Masson, CRNA       No Known Allergies Social History   Social History  . Marital status: Divorced    Spouse name: N/A  . Number of children: 0  . Years of education: N/A   Occupational History  . Not on file.   Social History Main Topics  . Smoking status: Current Every Day Smoker    Packs/day: 1.00    Years: 40.00    Types: Cigarettes  . Smokeless tobacco: Never Used  . Alcohol use 0.0 oz/week     Comment: some  . Drug use:     Types: Cocaine     Comment: patient states none- read lab report 09/02/2012/ 11-20-12 case cx.due to positive screen test-pt. advised no recreational drugs to be used  . Sexual activity: Not on file   Other Topics Concern  . Not on file   Social History Narrative   Lives with brother and his sister in [place].       Review of Systems  All other systems reviewed and  are negative.      Objective:   Physical Exam  Cardiovascular: Normal rate, regular rhythm and normal heart sounds.   Pulmonary/Chest: Effort normal and breath sounds normal.  Musculoskeletal:       Right knee: He exhibits bony tenderness. He exhibits normal range of motion, no swelling, no effusion, no erythema, no LCL laxity, normal patellar mobility, normal meniscus and no MCL laxity. Tenderness found. Medial joint line, lateral joint line, MCL and LCL tenderness noted.       Left knee: He exhibits bony tenderness. He exhibits normal range of motion, no swelling, no effusion, no erythema, no LCL  laxity, normal patellar mobility, normal meniscus and no MCL laxity. Tenderness found. Medial joint line, lateral joint line, MCL and LCL tenderness noted.  Vitals reviewed.         Assessment & Plan:  Hyperglycemia - Plan: Hemoglobin A1C, fingerstick, Glucose (CBG)  Chronic pain of both knees Start metformin 500 mg by mouth twice a day. Start Basaglar 20 units sq now.  Check fasting blood sugar in the morning. If greater than 130 as I would expect it to be, the patient will increase by 1 unit to 21 units. He will continue to increase insulin by 1 unit every day until fasting blood sugar is less than 130. Recheck fasting blood sugars next Friday and make a gross adjustment at that time.  I cancelled the Rx of glipizide sent to his pharmacy after reconsidering his HgA1c.  We discussed therapeutic lifestyle changes including a low carbohydrate diet, increasing aerobic exercise, and weight loss.

## 2016-02-03 ENCOUNTER — Encounter: Payer: Self-pay | Admitting: Family Medicine

## 2016-02-03 ENCOUNTER — Ambulatory Visit (INDEPENDENT_AMBULATORY_CARE_PROVIDER_SITE_OTHER): Payer: Medicare Other | Admitting: Family Medicine

## 2016-02-03 VITALS — BP 120/70 | HR 88 | Temp 98.4°F | Resp 20 | Ht 66.0 in | Wt 299.0 lb

## 2016-02-03 DIAGNOSIS — R739 Hyperglycemia, unspecified: Secondary | ICD-10-CM | POA: Diagnosis not present

## 2016-02-03 DIAGNOSIS — E119 Type 2 diabetes mellitus without complications: Secondary | ICD-10-CM

## 2016-02-03 DIAGNOSIS — Z794 Long term (current) use of insulin: Secondary | ICD-10-CM | POA: Diagnosis not present

## 2016-02-03 NOTE — Progress Notes (Signed)
Subjective:    Patient ID: Kenneth [name], male    DOB: 13-Mar-1961, 55 y.o.   MRN: [id number]  Medication Refill    08/10/15 Patient reports 2-3 months of bilateral knee pain. The pain is worse in his left knee compared to his right knee. The pain is located posteriorly on both sides. He reports pain with standing and with walking. He Moses he climbs on and off of the exam table. He moans when I palpate his knee. There is no laxity to varus or valgus stress bilaterally. He has a negative anterior posterior drawer sign bilaterally however he moans with each test I perform on either knee. There is no erythema or effusion. There is no warmth. He has a negative Apley grind although he does wince in pain with flexion of both knees. Pain seems to be equal on the medial and lateral joint lines. Eyes any specific injury. He has not tried any Tylenol or ibuprofen. He states that he has been taking oxycodone which helps the pain is requesting a refill of oxycodone.  At that time, my plan was: Pain is out of proportion to exam. It is difficult to ascertain the exact source of his pain given the fact he reports pain with every provocative test that I performed. Given his morbid obesity, I suspect that he has osteoarthritis in both knees. I would like to obtain x-rays of both knees to evaluate further. His left knee is the worst. Using sterile technique, I injected the left knee with a mixture of 2 mL of lidocaine, 2 mL of Marcaine, and 2 mL of 40 mg per mL Kenalog. The patient tolerated the procedure well with no complication. I would like him to begin diclofenac 75 mg by mouth twice a day for arthritis. Further treatment pending the results of the x-rays and the performance of a cortisone injection. He also complains of stiffness and tightness in his hamstrings bilaterally. There is no evidence of claudication and he has normal pedal pulses bilaterally. I believe he has hamstring stiffness secondary to an abnormal gait  because of his knee pain. I will not prescribe oxycodone until we have exhausted all conservative options and I explained this to the patient in detail.  08/17/15 X-rays revealed stable mild patellofemoral degenerative changes bilaterally but no severe osteoarthritis, fracture, or dislocation.  I reviewed the x-rays with the patient today in clinic. Joint spaces seem well preserved. There is no explanation on the x-rays the severity of his knee pain. He states that the cortisone injection he had the left knee helped his pain for one or 2 days and the pain came back just as severe. He reports severe pain with standing. There is no erythema or effusion. Pain is out of proportion to exam.  01/28/16 Patient is interested in trying lidocaine cream for his knees. He would like to try 5% cream applied 4 times a day to the knees for pain. I believe this is reasonable. Over the last several weeks, he has experienced polyuria, polydipsia, and blurry vision. He is also lost 17 pounds since I last saw him. His sister has been checking his blood sugars and have found it running between 259 and 399!  Blood sugar here today is 321 confirmed on our machine. Hemoglobin A1c is >14.  At that time, my plan was: Start metformin 500 mg by mouth twice a day. Start Basaglar 20 units sq now.  Check fasting blood sugar in the morning. If greater than 130 as  I would expect it to be, the patient will increase by 1 unit to 21 units. He will continue to increase insulin by 1 unit every day until fasting blood sugar is less than 130. Recheck fasting blood sugars next Friday and make a gross adjustment at that time.  I cancelled the Rx of glipizide sent to his pharmacy after reconsidering his HgA1c.  We discussed therapeutic lifestyle changes including a low carbohydrate diet, increasing aerobic exercise, and weight loss.  02/03/16 Patient only has to sugar values for me to review. Of note, he has a history of medical noncompliance. He  states that his two-hour postprandial sugar last night was 130 and his fasting blood sugar this morning was 150. He is only on 23 units of insulin. He did not titrate it any further.  He denies any hypoglycemia. He continues to smoke Past Medical History:  Diagnosis Date  . Arthritis   . Asthma   . Chronic bronchitis ([place])   . GERD (gastroesophageal reflux disease)   . Hypercholesteremia   . Hypertension   . OSA treated with BiPAP    ahi-132, bipap 23/19   Past Surgical History:  Procedure Laterality Date  . ESOPHAGOGASTRODUODENOSCOPY (EGD) WITH PROPOFOL N/A 12/11/2012   Procedure: ESOPHAGOGASTRODUODENOSCOPY (EGD) WITH PROPOFOL;  Surgeon: Arta Silence, MD;  Location: [facility];  Service: Endoscopy;  Laterality: N/A;  . FINGER AMPUTATION    . KNEE ARTHROSCOPY Left 11/15/2015   Procedure: ARTHROSCOPY KNEE AND MEDIAL MENISECTOMY;  Surgeon: Paralee Cancel, MD;  Location: [facility];  Service: Orthopedics;  Laterality: Left;   Current Outpatient Prescriptions on File Prior to Visit  Medication Sig Dispense Refill  . aspirin 81 MG tablet Take 81 mg by mouth daily.    . furosemide (LASIX) 20 MG tablet Take 1 tablet (20 mg total) by mouth daily. 30 tablet 5  . glipiZIDE (GLUCOTROL XL) 10 MG 24 hr tablet Take 1 tablet (10 mg total) by mouth daily with breakfast. 30 tablet 2  . lansoprazole (PREVACID) 30 MG capsule TAKE 1 CAPSULE BY MOUTH DAILY AT 12:00 NOON. 90 capsule 1  . metFORMIN (GLUCOPHAGE) 500 MG tablet Take 1 tablet (500 mg total) by mouth 2 (two) times daily with a meal. 180 tablet 3  . nitroGLYCERIN (NITROSTAT) 0.4 MG SL tablet Place 1 tablet (0.4 mg total) under the tongue every 5 (five) minutes as needed. For chest pain 20 tablet 1  . PROAIR HFA 108 (90 BASE) MCG/ACT inhaler INHALE 2 PUFFS INTO THE LUNGS EVERY 4 HOURS AS NEEDED FOR SHORTNESS OF BREATH 8.5 g 2  . UNABLE TO FIND Lidocaine ointment 5%     Current Facility-Administered Medications on File Prior to Visit  Medication Dose  Route Frequency Provider Last Rate Last Dose  . lactated ringers infusion    Continuous PRN Chyrel Masson, CRNA       No Known Allergies Social History   Social History  . Marital status: Divorced    Spouse name: N/A  . Number of children: 0  . Years of education: N/A   Occupational History  . Not on file.   Social History Main Topics  . Smoking status: Current Every Day Smoker    Packs/day: 1.00    Years: 40.00    Types: Cigarettes  . Smokeless tobacco: Never Used  . Alcohol use 0.0 oz/week     Comment: some  . Drug use:     Types: Cocaine     Comment: patient states none- read lab report  09/02/2012/ 11-20-12 case cx.due to positive screen test-pt. advised no recreational drugs to be used  . Sexual activity: Not on file   Other Topics Concern  . Not on file   Social History Narrative   Lives with brother and his sister in [place].       Review of Systems  All other systems reviewed and are negative.      Objective:   Physical Exam  Cardiovascular: Normal rate, regular rhythm and normal heart sounds.   Pulmonary/Chest: Effort normal and breath sounds normal.  Vitals reviewed.         Assessment & Plan:  Hyperglycemia  Insulin-requiring or dependent type II diabetes mellitus ([place]) Continue basaglar at 23 units area and I'm afraid to increase any further without any other sugar data to review. Recheck in one month as the metformin takes full effect. He strictly running between 1:30 and 150, the metformin should be able to make up for the difference. Recommended the patient quit smoking.

## 2016-02-07 ENCOUNTER — Telehealth: Payer: Self-pay | Admitting: Family Medicine

## 2016-02-07 DIAGNOSIS — M25562 Pain in left knee: Secondary | ICD-10-CM | POA: Diagnosis not present

## 2016-02-07 MED ORDER — BASAGLAR KWIKPEN 100 UNIT/ML ~~LOC~~ SOPN: 23.0000 [IU] | PEN_INJECTOR | Freq: Every day | SUBCUTANEOUS | 3 refills | Status: DC

## 2016-02-07 MED ORDER — INSULIN PEN NEEDLE 31G X 5 MM MISC: 3 refills | Status: DC

## 2016-02-07 NOTE — Telephone Encounter (Signed)
Needing refill on his insulin - med sent to pharm

## 2016-02-08 DIAGNOSIS — M1712 Unilateral primary osteoarthritis, left knee: Secondary | ICD-10-CM | POA: Diagnosis not present

## 2016-02-08 DIAGNOSIS — M25562 Pain in left knee: Secondary | ICD-10-CM | POA: Diagnosis not present

## 2016-02-09 ENCOUNTER — Other Ambulatory Visit: Payer: Self-pay | Admitting: Family Medicine

## 2016-02-09 MED ORDER — INSULIN GLARGINE 100 UNIT/ML SOLOSTAR PEN: 23.0000 [IU] | PEN_INJECTOR | Freq: Every day | SUBCUTANEOUS | 99 refills | Status: DC

## 2016-02-15 ENCOUNTER — Other Ambulatory Visit: Payer: Self-pay | Admitting: Family Medicine

## 2016-02-16 DIAGNOSIS — M25562 Pain in left knee: Secondary | ICD-10-CM | POA: Diagnosis not present

## 2016-02-16 DIAGNOSIS — M1712 Unilateral primary osteoarthritis, left knee: Secondary | ICD-10-CM | POA: Diagnosis not present

## 2016-02-24 DIAGNOSIS — M25562 Pain in left knee: Secondary | ICD-10-CM | POA: Diagnosis not present

## 2016-02-24 DIAGNOSIS — M1712 Unilateral primary osteoarthritis, left knee: Secondary | ICD-10-CM | POA: Diagnosis not present

## 2016-03-06 ENCOUNTER — Ambulatory Visit: Payer: Medicare Other | Admitting: Family Medicine

## 2016-03-07 ENCOUNTER — Ambulatory Visit: Payer: Medicare Other | Admitting: Family Medicine

## 2016-03-09 ENCOUNTER — Ambulatory Visit (INDEPENDENT_AMBULATORY_CARE_PROVIDER_SITE_OTHER): Payer: Medicare Other | Admitting: Family Medicine

## 2016-03-09 ENCOUNTER — Encounter: Payer: Self-pay | Admitting: Family Medicine

## 2016-03-09 VITALS — BP 128/74 | HR 80 | Temp 98.3°F | Resp 20 | Ht 66.0 in | Wt 303.0 lb

## 2016-03-09 DIAGNOSIS — Z794 Long term (current) use of insulin: Secondary | ICD-10-CM

## 2016-03-09 DIAGNOSIS — E119 Type 2 diabetes mellitus without complications: Secondary | ICD-10-CM | POA: Diagnosis not present

## 2016-03-09 NOTE — Progress Notes (Signed)
Subjective:    Patient ID: Kenneth [name], male    DOB: 13-Mar-1961, 55 y.o.   MRN: [id number]  Medication Refill    08/10/15 Patient reports 2-3 months of bilateral knee pain. The pain is worse in his left knee compared to his right knee. The pain is located posteriorly on both sides. He reports pain with standing and with walking. He Moses he climbs on and off of the exam table. He moans when I palpate his knee. There is no laxity to varus or valgus stress bilaterally. He has a negative anterior posterior drawer sign bilaterally however he moans with each test I perform on either knee. There is no erythema or effusion. There is no warmth. He has a negative Apley grind although he does wince in pain with flexion of both knees. Pain seems to be equal on the medial and lateral joint lines. Eyes any specific injury. He has not tried any Tylenol or ibuprofen. He states that he has been taking oxycodone which helps the pain is requesting a refill of oxycodone.  At that time, my plan was: Pain is out of proportion to exam. It is difficult to ascertain the exact source of his pain given the fact he reports pain with every provocative test that I performed. Given his morbid obesity, I suspect that he has osteoarthritis in both knees. I would like to obtain x-rays of both knees to evaluate further. His left knee is the worst. Using sterile technique, I injected the left knee with a mixture of 2 mL of lidocaine, 2 mL of Marcaine, and 2 mL of 40 mg per mL Kenalog. The patient tolerated the procedure well with no complication. I would like him to begin diclofenac 75 mg by mouth twice a day for arthritis. Further treatment pending the results of the x-rays and the performance of a cortisone injection. He also complains of stiffness and tightness in his hamstrings bilaterally. There is no evidence of claudication and he has normal pedal pulses bilaterally. I believe he has hamstring stiffness secondary to an abnormal gait  because of his knee pain. I will not prescribe oxycodone until we have exhausted all conservative options and I explained this to the patient in detail.  08/17/15 X-rays revealed stable mild patellofemoral degenerative changes bilaterally but no severe osteoarthritis, fracture, or dislocation.  I reviewed the x-rays with the patient today in clinic. Joint spaces seem well preserved. There is no explanation on the x-rays the severity of his knee pain. He states that the cortisone injection he had the left knee helped his pain for one or 2 days and the pain came back just as severe. He reports severe pain with standing. There is no erythema or effusion. Pain is out of proportion to exam.  01/28/16 Patient is interested in trying lidocaine cream for his knees. He would like to try 5% cream applied 4 times a day to the knees for pain. I believe this is reasonable. Over the last several weeks, he has experienced polyuria, polydipsia, and blurry vision. He is also lost 17 pounds since I last saw him. His sister has been checking his blood sugars and have found it running between 259 and 399!  Blood sugar here today is 321 confirmed on our machine. Hemoglobin A1c is >14.  At that time, my plan was: Start metformin 500 mg by mouth twice a day. Start Basaglar 20 units sq now.  Check fasting blood sugar in the morning. If greater than 130 as  I would expect it to be, the patient will increase by 1 unit to 21 units. He will continue to increase insulin by 1 unit every day until fasting blood sugar is less than 130. Recheck fasting blood sugars next Friday and make a gross adjustment at that time.  I cancelled the Rx of glipizide sent to his pharmacy after reconsidering his HgA1c.  We discussed therapeutic lifestyle changes including a low carbohydrate diet, increasing aerobic exercise, and weight loss.  02/03/16 Patient only has to sugar values for me to review. Of note, he has a history of medical noncompliance. He  states that his two-hour postprandial sugar last night was 130 and his fasting blood sugar this morning was 150. He is only on 23 units of insulin. He did not titrate it any further.  He denies any hypoglycemia. He continues to smoke.  At that time, my plan was: Continue basaglar at 23 units area and I'm afraid to increase any further without any other sugar data to review. Recheck in one month as the metformin takes full effect. He strictly running between 1:30 and 150, the metformin should be able to make up for the difference. Recommended the patient quit smoking.    03/09/16 Patient is tolerating the metformin well. His fasting blood sugars are all between 101 130. His two-hour postprandial sugars are between 101 160. I am very satisfied with his blood sugar readings. He denies any hypoglycemia. He is currently on 23 units of glargine a day and metformin. Past Medical History:  Diagnosis Date  . Arthritis   . Asthma   . Chronic bronchitis ([place])   . GERD (gastroesophageal reflux disease)   . Hypercholesteremia   . Hypertension   . OSA treated with BiPAP    ahi-132, bipap 23/19   Past Surgical History:  Procedure Laterality Date  . ESOPHAGOGASTRODUODENOSCOPY (EGD) WITH PROPOFOL N/A 12/11/2012   Procedure: ESOPHAGOGASTRODUODENOSCOPY (EGD) WITH PROPOFOL;  Surgeon: Arta Silence, MD;  Location: [facility];  Service: Endoscopy;  Laterality: N/A;  . FINGER AMPUTATION    . KNEE ARTHROSCOPY Left 11/15/2015   Procedure: ARTHROSCOPY KNEE AND MEDIAL MENISECTOMY;  Surgeon: Paralee Cancel, MD;  Location: [facility];  Service: Orthopedics;  Laterality: Left;   Current Outpatient Prescriptions on File Prior to Visit  Medication Sig Dispense Refill  . aspirin 81 MG tablet Take 81 mg by mouth daily.    . Blood Glucose Monitoring Suppl (ONETOUCH VERIO) w/Device KIT     . furosemide (LASIX) 20 MG tablet TAKE 1 TABLET BY MOUTH DAILY. 30 tablet 5  . Insulin Glargine (LANTUS SOLOSTAR) 100 UNIT/ML Solostar Pen  Inject 23 Units into the skin daily at 10 pm. 5 pen PRN  . Insulin Pen Needle 31G X 5 MM MISC Use qd with insulin  E11.9 100 each 3  . lansoprazole (PREVACID) 30 MG capsule TAKE 1 CAPSULE BY MOUTH DAILY AT 12:00 NOON. 90 capsule 1  . metFORMIN (GLUCOPHAGE) 500 MG tablet Take 1 tablet (500 mg total) by mouth 2 (two) times daily with a meal. 180 tablet 3  . nitroGLYCERIN (NITROSTAT) 0.4 MG SL tablet Place 1 tablet (0.4 mg total) under the tongue every 5 (five) minutes as needed. For chest pain 20 tablet 1  . ONETOUCH DELICA LANCETS 26S MISC     . ONETOUCH VERIO test strip     . PROAIR HFA 108 (90 BASE) MCG/ACT inhaler INHALE 2 PUFFS INTO THE LUNGS EVERY 4 HOURS AS NEEDED FOR SHORTNESS OF BREATH 8.5 g 2  .  UNABLE TO FIND Lidocaine ointment 5%    . glipiZIDE (GLUCOTROL XL) 10 MG 24 hr tablet Take 1 tablet (10 mg total) by mouth daily with breakfast. (Patient not taking: Reported on 03/09/2016) 30 tablet 2   Current Facility-Administered Medications on File Prior to Visit  Medication Dose Route Frequency Provider Last Rate Last Dose  . lactated ringers infusion    Continuous PRN Chyrel Masson, CRNA       No Known Allergies Social History   Social History  . Marital status: Divorced    Spouse name: N/A  . Number of children: 0  . Years of education: N/A   Occupational History  . Not on file.   Social History Main Topics  . Smoking status: Current Every Day Smoker    Packs/day: 1.00    Years: 40.00    Types: Cigarettes  . Smokeless tobacco: Never Used  . Alcohol use 0.0 oz/week     Comment: some  . Drug use:     Types: Cocaine     Comment: patient states none- read lab report 09/02/2012/ 11-20-12 case cx.due to positive screen test-pt. advised no recreational drugs to be used  . Sexual activity: Not on file   Other Topics Concern  . Not on file   Social History Narrative   Lives with brother and his sister in [place].       Review of Systems  All other systems reviewed and  are negative.      Objective:   Physical Exam  Cardiovascular: Normal rate, regular rhythm and normal heart sounds.   Pulmonary/Chest: Effort normal and breath sounds normal.  Vitals reviewed.         Assessment & Plan:  Insulin-requiring or dependent type II diabetes mellitus ([place]) No change in medication at this time. Recheck in 3 months. At that time repeat hemoglobin A1c as well as urine microalbumin and fasting lipid panel. Patient will benefit from a statin most likely. If patient has microalbuminuria I will also place on an ACE inhibitor. Continue to recommend smoking cessation but at the present time his sugars under good control

## 2016-03-11 DIAGNOSIS — G4733 Obstructive sleep apnea (adult) (pediatric): Secondary | ICD-10-CM | POA: Diagnosis not present

## 2016-03-24 DIAGNOSIS — M1712 Unilateral primary osteoarthritis, left knee: Secondary | ICD-10-CM | POA: Diagnosis not present

## 2016-03-24 DIAGNOSIS — M25562 Pain in left knee: Secondary | ICD-10-CM | POA: Diagnosis not present

## 2016-03-24 DIAGNOSIS — Z4789 Encounter for other orthopedic aftercare: Secondary | ICD-10-CM | POA: Diagnosis not present

## 2016-04-21 ENCOUNTER — Ambulatory Visit (INDEPENDENT_AMBULATORY_CARE_PROVIDER_SITE_OTHER): Payer: Medicare Other | Admitting: Family Medicine

## 2016-04-21 ENCOUNTER — Encounter: Payer: Self-pay | Admitting: Family Medicine

## 2016-04-21 VITALS — BP 140/74 | HR 84 | Temp 98.1°F | Resp 20 | Ht 66.0 in | Wt 300.0 lb

## 2016-04-21 DIAGNOSIS — G4452 New daily persistent headache (NDPH): Secondary | ICD-10-CM

## 2016-04-21 DIAGNOSIS — Z794 Long term (current) use of insulin: Secondary | ICD-10-CM

## 2016-04-21 DIAGNOSIS — E119 Type 2 diabetes mellitus without complications: Secondary | ICD-10-CM | POA: Diagnosis not present

## 2016-04-21 MED ORDER — TOPIRAMATE 25 MG PO TABS: 50.0000 mg | ORAL_TABLET | Freq: Two times a day (BID) | ORAL | 1 refills | Status: DC

## 2016-04-21 NOTE — Progress Notes (Signed)
Subjective:    Patient ID: Kenneth [name], male    DOB: 05-17-1960, 56 y.o.   MRN: [id number]  Medication Refill    08/10/15 Patient reports 2-3 months of bilateral knee pain. The pain is worse in his left knee compared to his right knee. The pain is located posteriorly on both sides. He reports pain with standing and with walking. He Moses he climbs on and off of the exam table. He moans when I palpate his knee. There is no laxity to varus or valgus stress bilaterally. He has a negative anterior posterior drawer sign bilaterally however he moans with each test I perform on either knee. There is no erythema or effusion. There is no warmth. He has a negative Apley grind although he does wince in pain with flexion of both knees. Pain seems to be equal on the medial and lateral joint lines. Eyes any specific injury. He has not tried any Tylenol or ibuprofen. He states that he has been taking oxycodone which helps the pain is requesting a refill of oxycodone.  At that time, my plan was: Pain is out of proportion to exam. It is difficult to ascertain the exact source of his pain given the fact he reports pain with every provocative test that I performed. Given his morbid obesity, I suspect that he has osteoarthritis in both knees. I would like to obtain x-rays of both knees to evaluate further. His left knee is the worst. Using sterile technique, I injected the left knee with a mixture of 2 mL of lidocaine, 2 mL of Marcaine, and 2 mL of 40 mg per mL Kenalog. The patient tolerated the procedure well with no complication. I would like him to begin diclofenac 75 mg by mouth twice a day for arthritis. Further treatment pending the results of the x-rays and the performance of a cortisone injection. He also complains of stiffness and tightness in his hamstrings bilaterally. There is no evidence of claudication and he has normal pedal pulses bilaterally. I believe he has hamstring stiffness secondary to an abnormal gait  because of his knee pain. I will not prescribe oxycodone until we have exhausted all conservative options and I explained this to the patient in detail.  08/17/15 X-rays revealed stable mild patellofemoral degenerative changes bilaterally but no severe osteoarthritis, fracture, or dislocation.  I reviewed the x-rays with the patient today in clinic. Joint spaces seem well preserved. There is no explanation on the x-rays the severity of his knee pain. He states that the cortisone injection he had the left knee helped his pain for one or 2 days and the pain came back just as severe. He reports severe pain with standing. There is no erythema or effusion. Pain is out of proportion to exam.  01/28/16 Patient is interested in trying lidocaine cream for his knees. He would like to try 5% cream applied 4 times a day to the knees for pain. I believe this is reasonable. Over the last several weeks, he has experienced polyuria, polydipsia, and blurry vision. He is also lost 17 pounds since I last saw him. His sister has been checking his blood sugars and have found it running between 259 and 399!  Blood sugar here today is 321 confirmed on our machine. Hemoglobin A1c is >14.  At that time, my plan was: Start metformin 500 mg by mouth twice a day. Start Basaglar 20 units sq now.  Check fasting blood sugar in the morning. If greater than 130 as  I would expect it to be, the patient will increase by 1 unit to 21 units. He will continue to increase insulin by 1 unit every day until fasting blood sugar is less than 130. Recheck fasting blood sugars next Friday and make a gross adjustment at that time.  I cancelled the Rx of glipizide sent to his pharmacy after reconsidering his HgA1c.  We discussed therapeutic lifestyle changes including a low carbohydrate diet, increasing aerobic exercise, and weight loss.  02/03/16 Patient only has to sugar values for me to review. Of note, he has a history of medical noncompliance. He  states that his two-hour postprandial sugar last night was 130 and his fasting blood sugar this morning was 150. He is only on 23 units of insulin. He did not titrate it any further.  He denies any hypoglycemia. He continues to smoke.  At that time, my plan was: Continue basaglar at 23 units area and I'm afraid to increase any further without any other sugar data to review. Recheck in one month as the metformin takes full effect. He strictly running between 1:30 and 150, the metformin should be able to make up for the difference. Recommended the patient quit smoking.    03/09/16 Patient is tolerating the metformin well. His fasting blood sugars are all between 100-130. His two-hour postprandial sugars are between 101 160. I am very satisfied with his blood sugar readings. He denies any hypoglycemia. He is currently on 23 units of glargine a day and metformin.  At that time, my plan was: New persistent daily headache - Plan: topiramate (TOPAMAX) 25 MG tablet No change in medication at this time. Recheck in 3 months. At that time repeat hemoglobin A1c as well as urine microalbumin and fasting lipid panel. Patient will benefit from a statin most likely. If patient has microalbuminuria I will also place on an ACE inhibitor. Continue to recommend smoking cessation but at the present time his sugars under good control  04/21/16 The patient states that sugars are running between 101 150. The majority of his sugars in the morning are between 80 and 90. The main reason he is here today is because of persistent daily headaches that have been occurring for the last 3 months. He has a history of severe obstructive sleep apnea requiring BiPAP. The patient states he has been wearing his BiPAP machine now every night for the last 2 months and that he is compliant with therapy. He denies any head injury. He denies any neurologic deficit. The headache is diffuse. It is not unilateral. There is no photophobia or phonophobia  but it is constant. Today on examination cranial nerves II through XII are grossly intact with muscle strength 5 over 5 equal and symmetric in the upper and lower extremities and normal reflexes Past Medical History:  Diagnosis Date  . Arthritis   . Asthma   . Chronic bronchitis ([place])   . GERD (gastroesophageal reflux disease)   . Hypercholesteremia   . Hypertension   . OSA treated with BiPAP    ahi-132, bipap 23/19   Past Surgical History:  Procedure Laterality Date  . ESOPHAGOGASTRODUODENOSCOPY (EGD) WITH PROPOFOL N/A 12/11/2012   Procedure: ESOPHAGOGASTRODUODENOSCOPY (EGD) WITH PROPOFOL;  Surgeon: Arta Silence, MD;  Location: [facility];  Service: Endoscopy;  Laterality: N/A;  . FINGER AMPUTATION    . KNEE ARTHROSCOPY Left 11/15/2015   Procedure: ARTHROSCOPY KNEE AND MEDIAL MENISECTOMY;  Surgeon: Paralee Cancel, MD;  Location: [facility];  Service: Orthopedics;  Laterality: Left;  Current Outpatient Prescriptions on File Prior to Visit  Medication Sig Dispense Refill  . aspirin 81 MG tablet Take 81 mg by mouth daily.    . Blood Glucose Monitoring Suppl (ONETOUCH VERIO) w/Device KIT     . furosemide (LASIX) 20 MG tablet TAKE 1 TABLET BY MOUTH DAILY. 30 tablet 5  . Insulin Glargine (LANTUS SOLOSTAR) 100 UNIT/ML Solostar Pen Inject 23 Units into the skin daily at 10 pm. 5 pen PRN  . Insulin Pen Needle 31G X 5 MM MISC Use qd with insulin  E11.9 100 each 3  . lansoprazole (PREVACID) 30 MG capsule TAKE 1 CAPSULE BY MOUTH DAILY AT 12:00 NOON. 90 capsule 1  . metFORMIN (GLUCOPHAGE) 500 MG tablet Take 1 tablet (500 mg total) by mouth 2 (two) times daily with a meal. 180 tablet 3  . nitroGLYCERIN (NITROSTAT) 0.4 MG SL tablet Place 1 tablet (0.4 mg total) under the tongue every 5 (five) minutes as needed. For chest pain 20 tablet 1  . ONETOUCH DELICA LANCETS 76H MISC     . ONETOUCH VERIO test strip     . PROAIR HFA 108 (90 BASE) MCG/ACT inhaler INHALE 2 PUFFS INTO THE LUNGS EVERY 4 HOURS AS  NEEDED FOR SHORTNESS OF BREATH 8.5 g 2  . UNABLE TO FIND Lidocaine ointment 5%     Current Facility-Administered Medications on File Prior to Visit  Medication Dose Route Frequency Provider Last Rate Last Dose  . lactated ringers infusion    Continuous PRN Chyrel Masson, CRNA       No Known Allergies Social History   Social History  . Marital status: Divorced    Spouse name: N/A  . Number of children: 0  . Years of education: N/A   Occupational History  . Not on file.   Social History Main Topics  . Smoking status: Current Every Day Smoker    Packs/day: 1.00    Years: 40.00    Types: Cigarettes  . Smokeless tobacco: Never Used  . Alcohol use 0.0 oz/week     Comment: some  . Drug use: Yes    Types: Cocaine     Comment: patient states none- read lab report 09/02/2012/ 11-20-12 case cx.due to positive screen test-pt. advised no recreational drugs to be used  . Sexual activity: Not on file   Other Topics Concern  . Not on file   Social History Narrative   Lives with brother and his sister in [place]       Review of Systems  All other systems reviewed and are negative.      Objective:   Physical Exam  Constitutional: He is oriented to person, place, and time.  Cardiovascular: Normal rate, regular rhythm and normal heart sounds.   Pulmonary/Chest: Effort normal and breath sounds normal.  Neurological: He is alert and oriented to person, place, and time. He has normal reflexes. He displays normal reflexes. No cranial nerve deficit. He exhibits normal muscle tone. Coordination normal.  Vitals reviewed.         Assessment & Plan:  Insulin-requiring or dependent type II diabetes mellitus ([place]) - Plan: COMPLETE METABOLIC PANEL WITH GFR, Hemoglobin A1c  New persistent daily headache - Plan: topiramate (TOPAMAX) 25 MG tablet  I will have the patient begin Topamax 25 mg at night and increase by 25 mg every week until he is taking 50 mg twice a day by week 4. While I  have the patient here today I will check a CMP and  a hemoglobin A1c but his reported sugars sound excellent. Recheck in 4 weeks

## 2016-04-22 LAB — COMPLETE METABOLIC PANEL WITH GFR
ALT: 22 U/L (ref 9–46)
AST: 17 U/L (ref 10–35)
Albumin: 3.8 g/dL (ref 3.6–5.1)
Alkaline Phosphatase: 54 U/L (ref 40–115)
BUN: 14 mg/dL (ref 7–25)
CO2: 26 mmol/L (ref 20–31)
Calcium: 8.9 mg/dL (ref 8.6–10.3)
Chloride: 105 mmol/L (ref 98–110)
Creat: 0.76 mg/dL (ref 0.70–1.33)
GFR, Est African American: >89 mL/min (ref >=60)
GFR, Est Non African American: >89 mL/min (ref >=60)
Glucose, Bld: 84 mg/dL (ref 70–99)
Potassium: 3.9 mmol/L (ref 3.5–5.3)
Sodium: 138 mmol/L (ref 135–146)
Total Bilirubin: 0.2 mg/dL (ref 0.2–1.2)
Total Protein: 7.2 g/dL (ref 6.1–8.1)

## 2016-04-22 LAB — HEMOGLOBIN A1C
Hgb A1c MFr Bld: 7 % — ABNORMAL HIGH (ref <5.7)
Mean Plasma Glucose: 154 mg/dL

## 2016-04-24 DIAGNOSIS — K648 Other hemorrhoids: Secondary | ICD-10-CM | POA: Diagnosis not present

## 2016-04-24 DIAGNOSIS — K5730 Diverticulosis of large intestine without perforation or abscess without bleeding: Secondary | ICD-10-CM | POA: Diagnosis not present

## 2016-04-24 DIAGNOSIS — Z86010 Personal history of colonic polyps: Secondary | ICD-10-CM | POA: Diagnosis not present

## 2016-05-09 ENCOUNTER — Telehealth: Payer: Self-pay | Admitting: Family Medicine

## 2016-05-09 DIAGNOSIS — M25562 Pain in left knee: Principal | ICD-10-CM

## 2016-05-09 DIAGNOSIS — M25561 Pain in right knee: Principal | ICD-10-CM

## 2016-05-09 DIAGNOSIS — G8929 Other chronic pain: Secondary | ICD-10-CM

## 2016-05-09 NOTE — Telephone Encounter (Signed)
Pt has been being seen by [facility] ortho for his knee.  Is not happy there and wants to switch to [facility].  Told patient would be second opinion consult and he will have to have [facility] ortho notes sent to [place] before they will see.  Referral [laced per pt request.

## 2016-05-16 DIAGNOSIS — M25562 Pain in left knee: Secondary | ICD-10-CM | POA: Diagnosis not present

## 2016-05-22 ENCOUNTER — Encounter: Payer: Self-pay | Admitting: Adult Health

## 2016-05-22 ENCOUNTER — Ambulatory Visit (INDEPENDENT_AMBULATORY_CARE_PROVIDER_SITE_OTHER): Payer: Medicare Other | Admitting: Adult Health

## 2016-05-22 VITALS — BP 120/75 | HR 82 | Ht 66.0 in | Wt 297.8 lb

## 2016-05-22 DIAGNOSIS — F17210 Nicotine dependence, cigarettes, uncomplicated: Secondary | ICD-10-CM

## 2016-05-22 DIAGNOSIS — G4733 Obstructive sleep apnea (adult) (pediatric): Secondary | ICD-10-CM

## 2016-05-22 NOTE — Patient Instructions (Signed)
Try to use CPAP nightly >4 hours each night Improve Sleep Hygiene- eliminate TV 1 hour before bedtime, no caffeine after lunch If your symptoms worsen or you develop new symptoms please let us know.

## 2016-05-22 NOTE — Progress Notes (Addendum)
PATIENT: Kenneth [name] DOB: 1960/06/04  REASON FOR VISIT: follow up- obstructive sleep apnea on BiPAP HISTORY FROM: patient  HISTORY OF PRESENT ILLNESS: Kenneth [name] is a 56 year old male with a history of obstructive sleep apnea on BiPAP. He returns today for a compliance download. His download indicates that he uses machine 25 out of 30 days for compliance of 83%. He only uses machine greater than 4 hours 13 out of 30 days for compliance of 43%. On average he uses his machine 4 hours and 8 minutes. He has an IPAP 23cmH20 and EPAP of 19 cm water. His residual AHI is 0.8. He does not have a significant leak. The patient states that he does not sleep longer than 4 hours most nights. Typically goes to bed around 8 PM and wakeups around midnight. He states that he is not sure why he cannot sleep. He does have some knee pain has been ongoing since his surgery. He is no longer taking hydrocodone. The patient does watch TV before bedtime. Also reports that he wakes up in the middle the night he will turn the television on. He states once he wakes up he stays up the rest of the night and typically will take a nap the next day. He does not use the BiPAP when he is napping. He reports that he does not drink much caffeine. Continues to smoke cigarettes. Reports that he is considering stopping. Patient reports that he is having headaches. His primary care placed him on Topamax. Reports that he usually wakes up with a headache. He returns today for an evaluation.   HISTORY per Kenneth [name] notes: Kenneth [name] is a 56 year old right-handed gentleman with an underlying medical history of hyperlipidemia, coronary artery disease, reflux disease, morbid obesity, arthritis, chronic bronchitis in the setting of smoking, obesity hypoventilation syndrome and syncopal event, who presents for follow-up consultation of his obstructive sleep apnea, on BiPAP therapy. The patient is unaccompanied today. I last saw him on 05/10/2015, at  which time he was not fully compliant with BiPAP therapy. He reported needing new supplies. He was still smoking. He had lost about 10 pounds in 12 months. I asked him to be fully compliant with BiPAP therapy.: He reports needing new supplies. He admits to not using his machine very much. Has no issues using it though. He sleeps better with it, he admits. He still smokes. He has lost a little bit of weight. Between February 2016 and February this year, he has lost about 10 pounds. We talked about his BiPAP titration study from 10/15/2014.   01/18/2016: I reviewed his BiPAP compliance data from 12/18/2015 through 01/16/2016, which is a total of 30 days, during which time he used his BiPAP only 18 days with percent used days greater than 4 hours at 23%, indicating poor compliance with an average daily usage of only 2 hours and 15 minutes, residual AHI 1 per hour, leak low with the 95th percentile at 7.7 L/m on a pressure of 23/19 cm. In the past 90 days his compliance percentage was 24%.  , 01/18/2016: He reports doing okay with BiPAP, just received supplies. He had recent arthroscopic knee surgery on the left secondary to meniscus tear under Kenneth [name] on 11/15/2015. He admits to skipping nights, no clear reason, still smokes, has not cut back. Is in PT 2/week, Takes hydrocodone as needed, tries not to take it every day.  Previously:  I saw him on 07/30/2014, at which time we talked about his  auto BiPAP therapy. He was compliant with treatment but I suggested we bring him back for a full night titration study to optimize treatment setting and determine a set pressure. He had a CPAP titration study on 08/04/2014, this was followed by a BiPAP titration study on 10/15/2014. I went over his test results with him in detail today. His CPAP titration study from 08/04/2014 showed that his sleep apnea was not appropriately treated with CPAP. He was then switched to BiPAP therapy but an optimal pressure was not  determined during that study. CPAP was titrated from 5 cm to 16 cm but his AHI was 20 per hour while on CPAP. BiPAP was started at 18/14 and titrated to 22/18 but he had ongoing respiratory events, a brief trial of BiPAP ST was not successful during that study. Sleep efficiency was 79.4%. Sleep latency 7 minutes, wake after sleep onset was 89 minutes with mild to moderate sleep fragmentation noted. Average oxygen saturation was 92%, nadir was 83%. Based on unsuccessful treatment with CPAP therapy and suboptimal treatment with BiPAP therapy during the study I asked him to return for another full night BiPAP titration study. He had this on 10/15/2014. Sleep efficiency was 83.8%, latency to sleep 6 minutes, wake after sleep onset was 63 minutes with moderate sleep fragmentation noted. He had an elevated arousal index. He had increased percentages of stage I and stage II sleep, absence of slow-wave sleep and a REM percentage of 17.9% with a reduced REM latency of 24 minutes. He had mild PLMS with an index of 10.2 per hour, resulting in 3.9 arousals per hour. BiPAP was started at 8 over 4 cm and titrated to 23/19 cm. AHI was 0 per hour at that pressure. Supplemental oxygen was not utilized. On the final pressure, his desaturation nadir was 90%, supine REM sleep was achieved during the study but not on the final pressure. He indicated that he slept better than usual. Based on his test results are prescribed BiPAP therapy for home use with a setting of 29/19 cm via full facemask.  I reviewed his BiPAP compliance data from 04/05/2015 through 05/04/2015 which is a total of 30 days during which time he used his machine only 8 days with percent used days greater than 4 hours at 13% only, indicating poor compliance with an average usage of 1 hour and 17 minutes only. Residual AHI 3.1 per hour, leak low. Set pressure of 23/19 cm.  I first met him on 05/22/2014 at the request of his primary care physician, at which time the  patient reported snoring and daytime somnolence. I suggested he return for sleep study. He had a split-night sleep study on 05/25/2014 and went over his test results with him in detail today. Baseline sleep efficiency was 77.3% with a latency to sleep of 0 minutes and wake after sleep onset of 35.5 minutes with severe sleep fragmentation noted. He had an elevated arousal index. He had absence of slow-wave sleep and absence of REM sleep prior to CPAP. He had had rare pauses on his EKG, about 3 seconds long. He had moderate to loud snoring. His total AHI was 119 per hour. This line oxygen saturation was 89%, nadir was 76%. He was then titrated on CPAP from 5-15 cm and then placed on BiPAP therapy and titrated from 16/12 cm to 24/20 cm. He did not have resolution of his sleep disordered breathing while on CPAP or BiPAP standard. I suggested Auto BiPAP at home and also suggested he  undergo pulse oximetry testing while on BiPAP therapy at home.   I reviewed his BiPAP compliance data from 06/28/2014 through 07/27/2014 which is a total of 30 days during which time he used his machine only 22 days. Percent used days greater than 4 hours was 70%, indicating borderline/adequate compliance. Average usage for all nights was 4 hours and 54 minutes. He is on BiPAP with maximum IPAP of 25 cm, minimum EPAP of 14 cm and pressure support of 4. Average AHI was 3.3 per hour, adequate. Leak was acceptable with the 95th percentile at 13.1 L/m. 95th percentile of pressure appears to be 21/17.  I spoke with his cardiologist on the phone on 05/19/14; the patient was having some 5-6 s pauses. His Holter monitor was placed earlier this month. On 04/30/2014 he had a syncopal spell. He was in the emergency room for this. He has seen Dr. Percival Spanish for cardiac workup. He has been advised to have a sleep study. He snores, he wakes up not refreshed and he has multiple nighttime awakenings and nocturia at least 4-5 times per night. His Epworth  sleepiness score is 19 out of 24. He goes to bed around 9 PM and while he falls asleep fairly quickly he wakes up almost every hour he says. Sometimes he gets out of bed around 3 AM and never goes back to sleep. He denies restless leg symptoms but has some aching in his legs. He has lower extremity swelling. He wakes up with a headache at times. He is not aware of any family history of obstructive sleep apnea. He has never had a sleep study. He has been overweight for years. He snores and this is at times quite loud. [name] not sure if he actually quits breathing in his sleep but has woken up with a sense of gasping for air. He drinks about 2 Coca-Cola as per day. He does not drink coffee. He occasionally drinks alcohol. He smokes one pack per day.  He lives with his brother and his sister-in-law. He is divorced. He does not have any children. There are no pets in his bed. Occasionally he watches TV in bed. He does not work.    REVIEW OF SYSTEMS: Out of a complete 14 system review of symptoms, the patient complains only of the following symptoms, and all other reviewed systems are negative.  Constipation, excessive thirst, eye redness, eye discharge, cough, shortness of breath, leg swelling, insomnia, snoring, sleep talking, walking difficulty, muscle cramps, joint pain, joint swelling  ALLERGIES: No Known Allergies  HOME MEDICATIONS: Outpatient Medications Prior to Visit  Medication Sig Dispense Refill  . aspirin 81 MG tablet Take 81 mg by mouth daily.    . Blood Glucose Monitoring Suppl (ONETOUCH VERIO) w/Device KIT     . furosemide (LASIX) 20 MG tablet TAKE 1 TABLET BY MOUTH DAILY. 30 tablet 5  . Insulin Glargine (LANTUS SOLOSTAR) 100 UNIT/ML Solostar Pen Inject 23 Units into the skin daily at 10 pm. 5 pen PRN  . Insulin Pen Needle 31G X 5 MM MISC Use qd with insulin  E11.9 100 each 3  . lansoprazole (PREVACID) 30 MG capsule TAKE 1 CAPSULE BY MOUTH DAILY AT 12:00 NOON. 90 capsule 1  .  metFORMIN (GLUCOPHAGE) 500 MG tablet Take 1 tablet (500 mg total) by mouth 2 (two) times daily with a meal. 180 tablet 3  . nitroGLYCERIN (NITROSTAT) 0.4 MG SL tablet Place 1 tablet (0.4 mg total) under the tongue every 5 (five) minutes as needed.  For chest pain 20 tablet 1  . ONETOUCH DELICA LANCETS 25D MISC     . ONETOUCH VERIO test strip     . PROAIR HFA 108 (90 BASE) MCG/ACT inhaler INHALE 2 PUFFS INTO THE LUNGS EVERY 4 HOURS AS NEEDED FOR SHORTNESS OF BREATH 8.5 g 2  . topiramate (TOPAMAX) 25 MG tablet Take 2 tablets (50 mg total) by mouth 2 (two) times daily. 120 tablet 1  . UNABLE TO FIND Lidocaine ointment 5%     Facility-Administered Medications Prior to Visit  Medication Dose Route Frequency Provider Last Rate Last Dose  . lactated ringers infusion    Continuous PRN Chyrel Masson, CRNA        PAST MEDICAL HISTORY: Past Medical History:  Diagnosis Date  . Arthritis   . Asthma   . Chronic bronchitis ([place])   . GERD (gastroesophageal reflux disease)   . Hypercholesteremia   . Hypertension   . OSA treated with BiPAP    ahi-132, bipap 23/19    PAST SURGICAL HISTORY: Past Surgical History:  Procedure Laterality Date  . ESOPHAGOGASTRODUODENOSCOPY (EGD) WITH PROPOFOL N/A 12/11/2012   Procedure: ESOPHAGOGASTRODUODENOSCOPY (EGD) WITH PROPOFOL;  Surgeon: Arta Silence, MD;  Location: [facility];  Service: Endoscopy;  Laterality: N/A;  . FINGER AMPUTATION    . KNEE ARTHROSCOPY Left 11/15/2015   Procedure: ARTHROSCOPY KNEE AND MEDIAL MENISECTOMY;  Surgeon: Paralee Cancel, MD;  Location: [facility];  Service: Orthopedics;  Laterality: Left;    FAMILY HISTORY: Family History  Problem Relation Age of Onset  . Hypertension Father   . Diabetes Father   . Diabetes      Multiple family members  . Diabetes Sister   . Diabetes Brother   . Stroke Sister     SOCIAL HISTORY: Social History   Social History  . Marital status: Divorced    Spouse name: N/A  . Number of children: 0   . Years of education: N/A   Occupational History  . Not on file.   Social History Main Topics  . Smoking status: Current Every Day Smoker    Packs/day: 1.00    Years: 40.00    Types: Cigarettes  . Smokeless tobacco: Never Used  . Alcohol use 0.0 oz/week     Comment: some  . Drug use: Yes    Types: Cocaine     Comment: patient states none- read lab report 09/02/2012/ 11-20-12 case cx.due to positive screen test-pt. advised no recreational drugs to be used  . Sexual activity: Not on file   Other Topics Concern  . Not on file   Social History Narrative   Lives with brother and his sister in [place]        PHYSICAL EXAM  Vitals:   05/22/16 0933  BP: 120/75  Pulse: 82  Weight: 297 lb 12.8 oz (135.1 kg)  Height: '5\' 6"'  (1.676 m)   Body mass index is 48.07 kg/m.  Generalized: Well developed, in no acute distress   Neurological examination  Mentation: Alert oriented to time, place, history taking. Follows all commands speech and language fluent Cranial nerve II-XII: Pupils were equal round reactive to light. Extraocular movements were full, visual field were full on confrontational test. Facial sensation and strength were normal. Uvula tongue midline. Head turning and shoulder shrug  were normal and symmetric. Neck circumference 20 inches, Mallampati 2-3+ Motor: The motor testing reveals 5 over 5 strength of all 4 extremities. Good symmetric motor tone is noted throughout.  Sensory: Sensory testing is  intact to soft touch on all 4 extremities. No evidence of extinction is noted.  Coordination: Cerebellar testing reveals good finger-nose-finger and heel-to-shin bilaterally.  Gait and station: Gait is normal.  Reflexes: Deep tendon reflexes are symmetric and normal bilaterally.   DIAGNOSTIC DATA (LABS, IMAGING, TESTING) - I reviewed patient records, labs, notes, testing and imaging myself where available.  Lab Results  Component Value Date   WBC 6.0 01/28/2016   HGB 15.7  01/28/2016   HCT 48.3 01/28/2016   MCV 81.5 01/28/2016   PLT 236 01/28/2016      Component Value Date/Time   NA 138 04/21/2016 1621   K 3.9 04/21/2016 1621   CL 105 04/21/2016 1621   CO2 26 04/21/2016 1621   GLUCOSE 84 04/21/2016 1621   GLUCOSE 321 (H) 01/28/2016 1144   BUN 14 04/21/2016 1621   CREATININE 0.76 04/21/2016 1621   CALCIUM 8.9 04/21/2016 1621   PROT 7.2 04/21/2016 1621   ALBUMIN 3.8 04/21/2016 1621   AST 17 04/21/2016 1621   ALT 22 04/21/2016 1621   ALKPHOS 54 04/21/2016 1621   BILITOT 0.2 04/21/2016 1621   GFRNONAA >89 04/21/2016 1621   GFRAA >89 04/21/2016 1621    Lab Results  Component Value Date   HGBA1C 7.0 (H) 04/21/2016      ASSESSMENT AND PLAN 56 y.o. year old male  has a past medical history of Arthritis; Asthma; Chronic bronchitis ([place]); GERD (gastroesophageal reflux disease); Hypercholesteremia; Hypertension; and OSA treated with BiPAP. here with:  1. Obstructive sleep apnea on BiPAP  2. Cigarette smoker  The patient's download indicates that he does have better compliance however he should be using machine greater than 4 hours every night. We did discuss ways to promote better sleep hygiene such as eliminating television 1 hour before bedtime, no caffeine after lunch time and endorsing regular sleep and wake routine. Also encourage the patient to use the BiPAP when he is napping during the day. The patient does report that he considering stopping tobacco use however he reports that he plans to do it "cold [place]." Advised patient that his headaches could be due to sleep apnea. Increasing his usage of the BiPAP may eliminate or improve his headaches. Advised that if he is still having trouble sleeping greater than 4 hours at night he should let us know. Otherwise he will follow-up in 6 months with Dr. Carmelia Bake, MSN, NP-C 05/22/2016, 9:34 AM [facility] [address], [place], [place] [address] [phone]  I reviewed the above note and documentation by the Nurse Practitioner and agree with the history, physical exam, assessment and plan as outlined above. I was immediately available for face-to-face consultation. Star Age, MD, PhD [facility])

## 2016-06-05 ENCOUNTER — Other Ambulatory Visit: Payer: Medicare Other

## 2016-06-08 ENCOUNTER — Ambulatory Visit: Payer: Medicare Other | Admitting: Family Medicine

## 2016-06-12 ENCOUNTER — Other Ambulatory Visit: Payer: Medicare Other

## 2016-06-12 ENCOUNTER — Encounter: Payer: Self-pay | Admitting: Family Medicine

## 2016-06-12 ENCOUNTER — Ambulatory Visit (INDEPENDENT_AMBULATORY_CARE_PROVIDER_SITE_OTHER): Payer: Medicare Other | Admitting: Family Medicine

## 2016-06-12 VITALS — BP 128/68 | HR 88 | Temp 98.0°F | Resp 22 | Ht 66.0 in | Wt 299.0 lb

## 2016-06-12 DIAGNOSIS — E119 Type 2 diabetes mellitus without complications: Secondary | ICD-10-CM

## 2016-06-12 DIAGNOSIS — Z794 Long term (current) use of insulin: Secondary | ICD-10-CM

## 2016-06-12 MED ORDER — EMPAGLIFLOZIN 25 MG PO TABS: 25.0000 mg | ORAL_TABLET | Freq: Every day | ORAL | 5 refills | Status: DC

## 2016-06-12 NOTE — Progress Notes (Signed)
Subjective:    Patient ID: Kenneth [name], male    DOB: 1960/09/16, 56 y.o.   MRN: [id number]  Medication Refill    08/10/15 Patient reports 2-3 months of bilateral knee pain. The pain is worse in his left knee compared to his right knee. The pain is located posteriorly on both sides. He reports pain with standing and with walking. He Moses he climbs on and off of the exam table. He moans when I palpate his knee. There is no laxity to varus or valgus stress bilaterally. He has a negative anterior posterior drawer sign bilaterally however he moans with each test I perform on either knee. There is no erythema or effusion. There is no warmth. He has a negative Apley grind although he does wince in pain with flexion of both knees. Pain seems to be equal on the medial and lateral joint lines. Eyes any specific injury. He has not tried any Tylenol or ibuprofen. He states that he has been taking oxycodone which helps the pain is requesting a refill of oxycodone.  At that time, my plan was: Pain is out of proportion to exam. It is difficult to ascertain the exact source of his pain given the fact he reports pain with every provocative test that I performed. Given his morbid obesity, I suspect that he has osteoarthritis in both knees. I would like to obtain x-rays of both knees to evaluate further. His left knee is the worst. Using sterile technique, I injected the left knee with a mixture of 2 mL of lidocaine, 2 mL of Marcaine, and 2 mL of 40 mg per mL Kenalog. The patient tolerated the procedure well with no complication. I would like him to begin diclofenac 75 mg by mouth twice a day for arthritis. Further treatment pending the results of the x-rays and the performance of a cortisone injection. He also complains of stiffness and tightness in his hamstrings bilaterally. There is no evidence of claudication and he has normal pedal pulses bilaterally. I believe he has hamstring stiffness secondary to an abnormal gait  because of his knee pain. I will not prescribe oxycodone until we have exhausted all conservative options and I explained this to the patient in detail.  08/17/15 X-rays revealed stable mild patellofemoral degenerative changes bilaterally but no severe osteoarthritis, fracture, or dislocation.  I reviewed the x-rays with the patient today in clinic. Joint spaces seem well preserved. There is no explanation on the x-rays the severity of his knee pain. He states that the cortisone injection he had the left knee helped his pain for one or 2 days and the pain came back just as severe. He reports severe pain with standing. There is no erythema or effusion. Pain is out of proportion to exam.  01/28/16 Patient is interested in trying lidocaine cream for his knees. He would like to try 5% cream applied 4 times a day to the knees for pain. I believe this is reasonable. Over the last several weeks, he has experienced polyuria, polydipsia, and blurry vision. He is also lost 17 pounds since I last saw him. His sister has been checking his blood sugars and have found it running between 259 and 399!  Blood sugar here today is 321 confirmed on our machine. Hemoglobin A1c is >14.  At that time, my plan was: Start metformin 500 mg by mouth twice a day. Start Basaglar 20 units sq now.  Check fasting blood sugar in the morning. If greater than 130 as  I would expect it to be, the patient will increase by 1 unit to 21 units. He will continue to increase insulin by 1 unit every day until fasting blood sugar is less than 130. Recheck fasting blood sugars next Friday and make a gross adjustment at that time.  I cancelled the Rx of glipizide sent to his pharmacy after reconsidering his HgA1c.  We discussed therapeutic lifestyle changes including a low carbohydrate diet, increasing aerobic exercise, and weight loss.  02/03/16 Patient only has to sugar values for me to review. Of note, he has a history of medical noncompliance. He  states that his two-hour postprandial sugar last night was 130 and his fasting blood sugar this morning was 150. He is only on 23 units of insulin. He did not titrate it any further.  He denies any hypoglycemia. He continues to smoke.  At that time, my plan was: Continue basaglar at 23 units area and I'm afraid to increase any further without any other sugar data to review. Recheck in one month as the metformin takes full effect. He strictly running between 1:30 and 150, the metformin should be able to make up for the difference. Recommended the patient quit smoking.    03/09/16 Patient is tolerating the metformin well. His fasting blood sugars are all between 100-130. His two-hour postprandial sugars are between 101 160. I am very satisfied with his blood sugar readings. He denies any hypoglycemia. He is currently on 23 units of glargine a day and metformin.  At that time, my plan was: New persistent daily headache - Plan: topiramate (TOPAMAX) 25 MG tablet No change in medication at this time. Recheck in 3 months. At that time repeat hemoglobin A1c as well as urine microalbumin and fasting lipid panel. Patient will benefit from a statin most likely. If patient has microalbuminuria I will also place on an ACE inhibitor. Continue to recommend smoking cessation but at the present time his sugars under good control  04/21/16 The patient states that sugars are running between 101 150. The majority of his sugars in the morning are between 80 and 90. The main reason he is here today is because of persistent daily headaches that have been occurring for the last 3 months. He has a history of severe obstructive sleep apnea requiring BiPAP. The patient states he has been wearing his BiPAP machine now every night for the last 2 months and that he is compliant with therapy. He denies any head injury. He denies any neurologic deficit. The headache is diffuse. It is not unilateral. There is no photophobia or phonophobia  but it is constant. Today on examination cranial nerves II through XII are grossly intact with muscle strength 5 over 5 equal and symmetric in the upper and lower extremities and normal reflexes.  AT that time, my plan was: I will have the patient begin Topamax 25 mg at night and increase by 25 mg every week until he is taking 50 mg twice a day by week 4. While I have the patient here today I will check a CMP and a hemoglobin A1c but his reported sugars sound excellent. Recheck in 4 weeks  06/12/16 Hemoglobin A1c was 7.0 in January and that was without a full 3 months of therapy. He comes in today for recheck. Is currently taking metformin 500 mg by mouth twice a day and Lantus 23 units a day. He has daily sugars checked in the morning and in the evening furthermore the last 3 months. His  a.m. blood sugars range between 101 120. His evening blood sugars range between 90 and 120. There is no evidence of hypoglycemia. He is tolerating the medications well. He is interested in possibly trying to get off insulin. Since starting Topamax, his headaches have subsided. He is refill the medication and seems to be doing much better. Past Medical History:  Diagnosis Date  . Arthritis   . Asthma   . Chronic bronchitis ([place])   . GERD (gastroesophageal reflux disease)   . Hypercholesteremia   . Hypertension   . OSA treated with BiPAP    ahi-132, bipap 23/19   Past Surgical History:  Procedure Laterality Date  . ESOPHAGOGASTRODUODENOSCOPY (EGD) WITH PROPOFOL N/A 12/11/2012   Procedure: ESOPHAGOGASTRODUODENOSCOPY (EGD) WITH PROPOFOL;  Surgeon: Arta Silence, MD;  Location: [facility];  Service: Endoscopy;  Laterality: N/A;  . FINGER AMPUTATION    . KNEE ARTHROSCOPY Left 11/15/2015   Procedure: ARTHROSCOPY KNEE AND MEDIAL MENISECTOMY;  Surgeon: Paralee Cancel, MD;  Location: [facility];  Service: Orthopedics;  Laterality: Left;   Current Outpatient Prescriptions on File Prior to Visit  Medication Sig Dispense  Refill  . aspirin 81 MG tablet Take 81 mg by mouth daily.    . Blood Glucose Monitoring Suppl (ONETOUCH VERIO) w/Device KIT     . furosemide (LASIX) 20 MG tablet TAKE 1 TABLET BY MOUTH DAILY. 30 tablet 5  . Insulin Glargine (LANTUS SOLOSTAR) 100 UNIT/ML Solostar Pen Inject 23 Units into the skin daily at 10 pm. 5 pen PRN  . Insulin Pen Needle 31G X 5 MM MISC Use qd with insulin  E11.9 100 each 3  . lansoprazole (PREVACID) 30 MG capsule TAKE 1 CAPSULE BY MOUTH DAILY AT 12:00 NOON. 90 capsule 1  . metFORMIN (GLUCOPHAGE) 500 MG tablet Take 1 tablet (500 mg total) by mouth 2 (two) times daily with a meal. 180 tablet 3  . nitroGLYCERIN (NITROSTAT) 0.4 MG SL tablet Place 1 tablet (0.4 mg total) under the tongue every 5 (five) minutes as needed. For chest pain 20 tablet 1  . ONETOUCH DELICA LANCETS 11S MISC     . ONETOUCH VERIO test strip     . PROAIR HFA 108 (90 BASE) MCG/ACT inhaler INHALE 2 PUFFS INTO THE LUNGS EVERY 4 HOURS AS NEEDED FOR SHORTNESS OF BREATH 8.5 g 2  . topiramate (TOPAMAX) 25 MG tablet Take 2 tablets (50 mg total) by mouth 2 (two) times daily. 120 tablet 1  . UNABLE TO FIND Lidocaine ointment 5%     Current Facility-Administered Medications on File Prior to Visit  Medication Dose Route Frequency Provider Last Rate Last Dose  . lactated ringers infusion    Continuous PRN Chyrel Masson, CRNA       No Known Allergies Social History   Social History  . Marital status: Divorced    Spouse name: N/A  . Number of children: 0  . Years of education: N/A   Occupational History  . Not on file.   Social History Main Topics  . Smoking status: Current Every Day Smoker    Packs/day: 1.00    Years: 40.00    Types: Cigarettes  . Smokeless tobacco: Never Used  . Alcohol use 0.0 oz/week     Comment: some  . Drug use: Yes    Types: Cocaine     Comment: patient states none- read lab report 09/02/2012/ 11-20-12 case cx.due to positive screen test-pt. advised no recreational drugs  to be used  . Sexual activity: Not  on file   Other Topics Concern  . Not on file   Social History Narrative   Lives with brother and his sister in [place]       Review of Systems  All other systems reviewed and are negative.      Objective:   Physical Exam  Constitutional: He is oriented to person, place, and time.  Cardiovascular: Normal rate, regular rhythm and normal heart sounds.   Pulmonary/Chest: Effort normal and breath sounds normal.  Neurological: He is alert and oriented to person, place, and time. He has normal reflexes. No cranial nerve deficit. He exhibits normal muscle tone. Coordination normal.  Vitals reviewed.         Assessment & Plan:  Insulin-requiring or dependent type II diabetes mellitus (HCC)  Continue metformin 500 mg by mouth twice a day. Discontinue insulin as I no longer think this patient is insulin-dependent. We will switch the patient to Jardiance 25 mg by mouth daily. He will continue to monitor his fasting and two-hour postprandial sugars and notify me immediately if sugars rise above 200. Recheck hemoglobin A1c and lab work in 3 months including a microalbumin. Blood pressure is acceptable. Patient also had a small skin tag on his lower left eyelid which I removed with the scissors. No anesthesia was used. Hemostasis was achieved with pressure.

## 2016-06-13 DIAGNOSIS — G4733 Obstructive sleep apnea (adult) (pediatric): Secondary | ICD-10-CM | POA: Diagnosis not present

## 2016-07-10 ENCOUNTER — Ambulatory Visit (INDEPENDENT_AMBULATORY_CARE_PROVIDER_SITE_OTHER): Payer: Medicare Other | Admitting: Family Medicine

## 2016-07-10 ENCOUNTER — Encounter: Payer: Self-pay | Admitting: Family Medicine

## 2016-07-10 VITALS — BP 118/64 | HR 82 | Temp 97.9°F | Resp 18 | Ht 66.0 in | Wt 290.0 lb

## 2016-07-10 DIAGNOSIS — G8929 Other chronic pain: Secondary | ICD-10-CM | POA: Diagnosis not present

## 2016-07-10 DIAGNOSIS — M25561 Pain in right knee: Secondary | ICD-10-CM

## 2016-07-10 DIAGNOSIS — G4452 New daily persistent headache (NDPH): Secondary | ICD-10-CM

## 2016-07-10 DIAGNOSIS — M25562 Pain in left knee: Secondary | ICD-10-CM

## 2016-07-10 MED ORDER — MELOXICAM 15 MG PO TABS
15.0000 mg | ORAL_TABLET | Freq: Every day | ORAL | 4 refills | Status: DC
Start: 2016-07-10 — End: 2017-01-24

## 2016-07-10 MED ORDER — TOPIRAMATE 25 MG PO TABS: 50.0000 mg | ORAL_TABLET | Freq: Two times a day (BID) | ORAL | 1 refills | Status: DC

## 2016-07-10 MED ORDER — LANCING DEVICE MISC: 2 refills | Status: DC

## 2016-07-10 NOTE — Progress Notes (Signed)
Subjective:    Patient ID: Kenneth [name], male    DOB: 29-Sep-1960, 56 y.o.   MRN: [id number]  HPI  08/10/15 Patient reports 2-3 months of bilateral knee pain. The pain is worse in his left knee compared to his right knee. The pain is located posteriorly on both sides. He reports pain with standing and with walking. He Moses he climbs on and off of the exam table. He moans when I palpate his knee. There is no laxity to varus or valgus stress bilaterally. He has a negative anterior posterior drawer sign bilaterally however he moans with each test I perform on either knee. There is no erythema or effusion. There is no warmth. He has a negative Apley grind although he does wince in pain with flexion of both knees. Pain seems to be equal on the medial and lateral joint lines. Eyes any specific injury. He has not tried any Tylenol or ibuprofen. He states that he has been taking oxycodone which helps the pain is requesting a refill of oxycodone.  At that time, my plan was: Pain is out of proportion to exam. It is difficult to ascertain the exact source of his pain given the fact he reports pain with every provocative test that I performed. Given his morbid obesity, I suspect that he has osteoarthritis in both knees. I would like to obtain x-rays of both knees to evaluate further. His left knee is the worst. Using sterile technique, I injected the left knee with a mixture of 2 mL of lidocaine, 2 mL of Marcaine, and 2 mL of 40 mg per mL Kenalog. The patient tolerated the procedure well with no complication. I would like him to begin diclofenac 75 mg by mouth twice a day for arthritis. Further treatment pending the results of the x-rays and the performance of a cortisone injection. He also complains of stiffness and tightness in his hamstrings bilaterally. There is no evidence of claudication and he has normal pedal pulses bilaterally. I believe he has hamstring stiffness secondary to an abnormal gait because of his  knee pain. I will not prescribe oxycodone until we have exhausted all conservative options and I explained this to the patient in detail.  08/17/15 X-rays revealed stable mild patellofemoral degenerative changes bilaterally but no severe osteoarthritis, fracture, or dislocation.  I reviewed the x-rays with the patient today in clinic. Joint spaces seem well preserved. There is no explanation on the x-rays the severity of his knee pain. He states that the cortisone injection he had the left knee helped his pain for one or 2 days and the pain came back just as severe. He reports severe pain with standing. There is no erythema or effusion. Pain is out of proportion to exam.  At that time, my plan was: I am concerned about malingering for secondary gain. Another possibility would be some type of cartilage defect such as a meniscal tear that would only be evidenced on MRI. Therefore I will consult orthopedics. In good faith I will give the patient Norco 5/325 one every 6 hours for severe pain. I gave him 30 tablets. I asked pointed the patient that I would not refill this. I will not prescribe long-term narcotics when there is no obvious cause for his knee pain on the x-ray. I will wait to see orthopedics recommendations.   07/10/16 Patient ultimately saw Dr. Alvan Dame who operated on his left knee for meniscal tear. Afterwards, the patient continues to have severe chronic pain in both  knees. He states that the pain is severe. He has tried two separate anti-inflammatories although he cannot recall which one  but they provided him no relief. He is requesting something for pain.  He states that the previous orthopedist did nothing to help his pain. He has a slight effusion in his left knee. He states the pain is worse with prolonged standing and walking. He states that his knee hurts so bad he is unable to exercise. As stated earlier, x-rays reveal only mild patellofemoral degenerative changes but no significant  osteoarthritis. He is currently not taking any type of anti-inflammatory. Past Medical History:  Diagnosis Date  . Arthritis   . Asthma   . Chronic bronchitis ([place])   . GERD (gastroesophageal reflux disease)   . Hypercholesteremia   . Hypertension   . OSA treated with BiPAP    ahi-132, bipap 23/19   Past Surgical History:  Procedure Laterality Date  . ESOPHAGOGASTRODUODENOSCOPY (EGD) WITH PROPOFOL N/A 12/11/2012   Procedure: ESOPHAGOGASTRODUODENOSCOPY (EGD) WITH PROPOFOL;  Surgeon: Arta Silence, MD;  Location: [facility];  Service: Endoscopy;  Laterality: N/A;  . FINGER AMPUTATION    . KNEE ARTHROSCOPY Left 11/15/2015   Procedure: ARTHROSCOPY KNEE AND MEDIAL MENISECTOMY;  Surgeon: Paralee Cancel, MD;  Location: [facility];  Service: Orthopedics;  Laterality: Left;   Current Outpatient Prescriptions on File Prior to Visit  Medication Sig Dispense Refill  . aspirin 81 MG tablet Take 81 mg by mouth daily.    . Blood Glucose Monitoring Suppl (ONETOUCH VERIO) w/Device KIT     . empagliflozin (JARDIANCE) 25 MG TABS tablet Take 25 mg by mouth daily. 30 tablet 5  . furosemide (LASIX) 20 MG tablet TAKE 1 TABLET BY MOUTH DAILY. 30 tablet 5  . lansoprazole (PREVACID) 30 MG capsule TAKE 1 CAPSULE BY MOUTH DAILY AT 12:00 NOON. 90 capsule 1  . metFORMIN (GLUCOPHAGE) 500 MG tablet Take 1 tablet (500 mg total) by mouth 2 (two) times daily with a meal. 180 tablet 3  . nitroGLYCERIN (NITROSTAT) 0.4 MG SL tablet Place 1 tablet (0.4 mg total) under the tongue every 5 (five) minutes as needed. For chest pain 20 tablet 1  . ONETOUCH DELICA LANCETS 56P MISC     . ONETOUCH VERIO test strip     . PROAIR HFA 108 (90 BASE) MCG/ACT inhaler INHALE 2 PUFFS INTO THE LUNGS EVERY 4 HOURS AS NEEDED FOR SHORTNESS OF BREATH 8.5 g 2  . UNABLE TO FIND Lidocaine ointment 5%     Current Facility-Administered Medications on File Prior to Visit  Medication Dose Route Frequency Provider Last Rate Last Dose  . lactated ringers  infusion    Continuous PRN Chyrel Masson, CRNA       No Known Allergies Social History   Social History  . Marital status: Divorced    Spouse name: N/A  . Number of children: 0  . Years of education: N/A   Occupational History  . Not on file.   Social History Main Topics  . Smoking status: Current Every Day Smoker    Packs/day: 1.00    Years: 40.00    Types: Cigarettes  . Smokeless tobacco: Never Used  . Alcohol use 0.0 oz/week     Comment: some  . Drug use: Yes    Types: Cocaine     Comment: patient states none- read lab report 09/02/2012/ 11-20-12 case cx.due to positive screen test-pt. advised no recreational drugs to be used  . Sexual activity: Not on file  Other Topics Concern  . Not on file   Social History Narrative   Lives with brother and his sister in [place].       Review of Systems  All other systems reviewed and are negative.      Objective:   Physical Exam  Cardiovascular: Normal rate, regular rhythm and normal heart sounds.   Pulmonary/Chest: Effort normal and breath sounds normal.  Musculoskeletal:       Right knee: He exhibits bony tenderness. He exhibits normal range of motion, no swelling, no effusion, no erythema, no LCL laxity, normal patellar mobility, normal meniscus and no MCL laxity. Tenderness found. Medial joint line, lateral joint line, MCL and LCL tenderness noted.       Left knee: He exhibits bony tenderness. He exhibits normal range of motion, no swelling, no effusion, no erythema, no LCL laxity, normal patellar mobility, normal meniscus and no MCL laxity. Tenderness found. Medial joint line, lateral joint line, MCL and LCL tenderness noted.  Vitals reviewed.  Physical exam today April 16 is unremarkable. He does have a mild effusion in his left knee that wasn't present last year. He also reports diffuse tenderness to palpation along the medial and lateral joint lines bilaterally.       Assessment & Plan:  Chronic pain of both  knees  New persistent daily headache - Plan: topiramate (TOPAMAX) 25 MG tablet   Patient is requesting a refill of Topamax. He also requests anything for pain to help his knees. I explained to the patient that I do not see any objective findings that would warrant narcotics for knee pain. I will be willing to prescribe the patient meloxicam 15 mg a day for bilateral knee pain. At this point, if he continues to have bilateral knee pain, he can get a second opinion with another orthopedist or consider a referral to a pain clinic.

## 2016-07-21 ENCOUNTER — Other Ambulatory Visit: Payer: Medicare Other

## 2016-07-21 DIAGNOSIS — E1165 Type 2 diabetes mellitus with hyperglycemia: Secondary | ICD-10-CM

## 2016-07-22 LAB — HEMOGLOBIN A1C
Hgb A1c MFr Bld: 5.3 % (ref <5.7)
Mean Plasma Glucose: 105 mg/dL

## 2016-08-25 ENCOUNTER — Ambulatory Visit (INDEPENDENT_AMBULATORY_CARE_PROVIDER_SITE_OTHER): Payer: Medicare Other | Admitting: Family Medicine

## 2016-08-25 ENCOUNTER — Encounter: Payer: Self-pay | Admitting: Family Medicine

## 2016-08-25 VITALS — BP 134/80 | HR 82 | Temp 98.0°F | Resp 20 | Ht 66.0 in | Wt 288.0 lb

## 2016-08-25 DIAGNOSIS — Z23 Encounter for immunization: Secondary | ICD-10-CM

## 2016-08-25 DIAGNOSIS — Z0000 Encounter for general adult medical examination without abnormal findings: Secondary | ICD-10-CM

## 2016-08-25 DIAGNOSIS — E119 Type 2 diabetes mellitus without complications: Secondary | ICD-10-CM | POA: Diagnosis not present

## 2016-08-25 LAB — CBC WITH DIFFERENTIAL/PLATELET
Basophils Absolute: 62 cells/uL (ref 0–200)
Basophils Relative: 1 %
Eosinophils Absolute: 186 cells/uL (ref 15–500)
Eosinophils Relative: 3 %
HCT: 46.8 % (ref 38.5–50.0)
Hemoglobin: 15 g/dL (ref 13.0–17.0)
Lymphocytes Relative: 49 %
Lymphs Abs: 3038 cells/uL (ref 850–3900)
MCH: 26.8 pg — ABNORMAL LOW (ref 27.0–33.0)
MCHC: 32.1 g/dL (ref 32.0–36.0)
MCV: 83.6 fL (ref 80.0–100.0)
MPV: 10.4 fL (ref 7.5–12.5)
Monocytes Absolute: 496 cells/uL (ref 200–950)
Monocytes Relative: 8 %
Neutro Abs: 2418 cells/uL (ref 1500–7800)
Neutrophils Relative %: 39 %
Platelets: 242 10*3/uL (ref 140–400)
RBC: 5.6 MIL/uL (ref 4.20–5.80)
RDW: 15.8 % — ABNORMAL HIGH (ref 11.0–15.0)
WBC: 6.2 10*3/uL (ref 3.8–10.8)

## 2016-08-25 LAB — LIPID PANEL
Cholesterol: 184 mg/dL (ref <200)
HDL: 42 mg/dL (ref >40)
LDL Cholesterol: 116 mg/dL — ABNORMAL HIGH (ref <100)
Total CHOL/HDL Ratio: 4.4 Ratio (ref <5.0)
Triglycerides: 130 mg/dL (ref <150)
VLDL: 26 mg/dL (ref <30)

## 2016-08-25 LAB — COMPLETE METABOLIC PANEL WITH GFR
ALT: 24 U/L (ref 9–46)
AST: 17 U/L (ref 10–35)
Albumin: 4 g/dL (ref 3.6–5.1)
Alkaline Phosphatase: 63 U/L (ref 40–115)
BUN: 14 mg/dL (ref 7–25)
CO2: 22 mmol/L (ref 20–31)
Calcium: 9.4 mg/dL (ref 8.6–10.3)
Chloride: 107 mmol/L (ref 98–110)
Creat: 0.95 mg/dL (ref 0.70–1.33)
GFR, Est African American: >89 mL/min (ref >=60)
GFR, Est Non African American: 89 mL/min (ref >=60)
Glucose, Bld: 95 mg/dL (ref 70–99)
Potassium: 4.4 mmol/L (ref 3.5–5.3)
Sodium: 140 mmol/L (ref 135–146)
Total Bilirubin: 0.2 mg/dL (ref 0.2–1.2)
Total Protein: 7.3 g/dL (ref 6.1–8.1)

## 2016-08-25 NOTE — Addendum Note (Signed)
Addended by: Shary Decamp B on: 08/25/2016 12:26 PM   Modules accepted: Orders

## 2016-08-25 NOTE — Progress Notes (Signed)
 Subjective:    Patient ID: Kenneth [name], male    DOB: 11/19/1960, 56 y.o.   MRN: [id number]  HPI  Here today for complete physical exam. He is due for Pneumovax 23 along with a tetanus shot. Colonoscopy was performed earlier this year and was clear. He is due for PSA for prostate cancer screening. He is also due to see an eye specialist. His last hemoglobin A1c was outstanding at 5.3 and we discontinued his insulin. He is due for repeat his hemoglobin A1c in July or August. Diabetic foot exam is performed today and is normal. Past Medical History:  Diagnosis Date  . Arthritis   . Asthma   . Chronic bronchitis (HCC)   . GERD (gastroesophageal reflux disease)   . Hypercholesteremia   . Hypertension   . OSA treated with BiPAP    ahi-132, bipap 23/19   Past Surgical History:  Procedure Laterality Date  . ESOPHAGOGASTRODUODENOSCOPY (EGD) WITH PROPOFOL N/A 12/11/2012   Procedure: ESOPHAGOGASTRODUODENOSCOPY (EGD) WITH PROPOFOL;  Surgeon: William Outlaw, MD;  Location: [facility];  Service: Endoscopy;  Laterality: N/A;  . FINGER AMPUTATION    . KNEE ARTHROSCOPY Left 11/15/2015   Procedure: ARTHROSCOPY KNEE AND MEDIAL MENISECTOMY;  Surgeon: Matthew Olin, MD;  Location: [facility];  Service: Orthopedics;  Laterality: Left;   Current Outpatient Prescriptions on File Prior to Visit  Medication Sig Dispense Refill  . aspirin 81 MG tablet Take 81 mg by mouth daily.    . Blood Glucose Monitoring Suppl (ONETOUCH VERIO) w/Device KIT     . empagliflozin (JARDIANCE) 25 MG TABS tablet Take 25 mg by mouth daily. 30 tablet 5  . furosemide (LASIX) 20 MG tablet TAKE 1 TABLET BY MOUTH DAILY. 30 tablet 5  . Lancet Devices (LANCING DEVICE) MISC OneTouch Delica Lancing Device. 1 each 2  . lansoprazole (PREVACID) 30 MG capsule TAKE 1 CAPSULE BY MOUTH DAILY AT 12:00 NOON. 90 capsule 1  . meloxicam (MOBIC) 15 MG tablet Take 1 tablet (15 mg total) by mouth daily. 30 tablet 4  . metFORMIN (GLUCOPHAGE) 500 MG tablet  Take 1 tablet (500 mg total) by mouth 2 (two) times daily with a meal. 180 tablet 3  . nitroGLYCERIN (NITROSTAT) 0.4 MG SL tablet Place 1 tablet (0.4 mg total) under the tongue every 5 (five) minutes as needed. For chest pain 20 tablet 1  . ONETOUCH DELICA LANCETS 33G MISC     . ONETOUCH VERIO test strip     . PROAIR HFA 108 (90 BASE) MCG/ACT inhaler INHALE 2 PUFFS INTO THE LUNGS EVERY 4 HOURS AS NEEDED FOR SHORTNESS OF BREATH 8.5 g 2  . topiramate (TOPAMAX) 25 MG tablet Take 2 tablets (50 mg total) by mouth 2 (two) times daily. 120 tablet 1  . UNABLE TO FIND Lidocaine ointment 5%     Current Facility-Administered Medications on File Prior to Visit  Medication Dose Route Frequency Provider Last Rate Last Dose  . lactated ringers infusion    Continuous PRN Ouellette, Richard G, CRNA       No Known Allergies Social History   Social History  . Marital status: Divorced    Spouse name: N/A  . Number of children: 0  . Years of education: N/A   Occupational History  . Not on file.   Social History Main Topics  . Smoking status: Current Every Day Smoker    Packs/day: 1.00    Years: 40.00    Types: Cigarettes  . Smokeless tobacco: Never Used  .   Alcohol use 0.0 oz/week     Comment: some  . Drug use: Yes    Types: Cocaine     Comment: patient states none- read lab report 09/02/2012/ 11-20-12 case cx.due to positive screen test-pt. advised no recreational drugs to be used  . Sexual activity: Not on file   Other Topics Concern  . Not on file   Social History Narrative   Lives with brother and his sister in law.     Family History  Problem Relation Age of Onset  . Hypertension Father   . Diabetes Father   . Diabetes Unknown        Multiple family members  . Diabetes Sister   . Diabetes Brother   . Stroke Sister      Review of Systems  All other systems reviewed and are negative.      Objective:   Physical Exam  Constitutional: He is oriented to person, place, and time.  He appears well-developed and well-nourished. No distress.  HENT:  Head: Normocephalic and atraumatic.  Right Ear: External ear normal.  Left Ear: External ear normal.  Nose: Nose normal.  Mouth/Throat: Oropharynx is clear and moist. No oropharyngeal exudate.  Eyes: Conjunctivae and EOM are normal. Pupils are equal, round, and reactive to light. Right eye exhibits no discharge. Left eye exhibits no discharge. No scleral icterus.  Neck: Normal range of motion. Neck supple. No JVD present. No tracheal deviation present. No thyromegaly present.  Cardiovascular: Normal rate, regular rhythm, normal heart sounds and intact distal pulses.  Exam reveals no gallop and no friction rub.   No murmur heard. Pulmonary/Chest: Effort normal and breath sounds normal. No stridor. No respiratory distress. He has no wheezes. He has no rales. He exhibits no tenderness.  Abdominal: Soft. Bowel sounds are normal. He exhibits no distension and no mass. There is no tenderness. There is no rebound and no guarding.  Musculoskeletal: Normal range of motion. He exhibits tenderness. He exhibits no edema.  Lymphadenopathy:    He has no cervical adenopathy.  Neurological: He is alert and oriented to person, place, and time. He has normal reflexes. He displays normal reflexes. No cranial nerve deficit. He exhibits normal muscle tone. Coordination normal.  Skin: Skin is warm. No rash noted. He is not diaphoretic. No erythema. No pallor.  Psychiatric: He has a normal mood and affect. His behavior is normal. Judgment and thought content normal.  Vitals reviewed.         Assessment & Plan:  General medical exam - Plan: PSA  Controlled type 2 diabetes mellitus without complication, without long-term current use of insulin (HCC) - Plan: CBC with Differential/Platelet, COMPLETE METABOLIC PANEL WITH GFR, Lipid panel, Microalbumin, urine, Ambulatory referral to Ophthalmology  Patient is trying hard to work on diet and  exercise. [name] lost 24 pounds over the last year due to dietary changes. He is successfully discontinued Lantus because of that. Recheck hemoglobin A1c in August. Continue to encourage him to pursue weight loss. Patient received Pneumovax 23 today along with the tetanus vaccine. Colonoscopy is up-to-date. I will check a PSA. I'll schedule the patient for an ophthalmology appointment for a diabetic eye exam. He declines hepatitis C screening. He declines HIV screening. Check fasting lipid panel. Goal LDL is less than 100 

## 2016-08-26 LAB — PSA: PSA: 0.5 ng/mL (ref <=4.0)

## 2016-08-26 LAB — MICROALBUMIN, URINE: Microalb, Ur: 0.6 mg/dL

## 2016-08-30 ENCOUNTER — Other Ambulatory Visit: Payer: Self-pay | Admitting: Family Medicine

## 2016-08-30 MED ORDER — ATORVASTATIN CALCIUM 10 MG PO TABS: 10.0000 mg | ORAL_TABLET | Freq: Every day | ORAL | 3 refills | Status: DC

## 2016-09-05 ENCOUNTER — Encounter: Payer: Self-pay | Admitting: Family Medicine

## 2016-09-05 ENCOUNTER — Ambulatory Visit (INDEPENDENT_AMBULATORY_CARE_PROVIDER_SITE_OTHER): Payer: Medicare Other | Admitting: Family Medicine

## 2016-09-05 VITALS — BP 100/60 | HR 78 | Temp 98.4°F | Resp 18 | Ht 66.0 in | Wt 286.0 lb

## 2016-09-05 DIAGNOSIS — L03012 Cellulitis of left finger: Secondary | ICD-10-CM | POA: Diagnosis not present

## 2016-09-05 MED ORDER — SULFAMETHOXAZOLE-TRIMETHOPRIM 800-160 MG PO TABS: 1.0000 | ORAL_TABLET | Freq: Two times a day (BID) | ORAL | 0 refills | Status: DC

## 2016-09-05 NOTE — Progress Notes (Signed)
Subjective:    Patient ID: Kenneth [name], male    DOB: January 09, 1961, 56 y.o.   MRN: [id number]  HPI Patient reports pain and swelling in his left thumb for more than a month. On examination, there is a 3 mm in diameter accumulation of pus under the thumbnail at the central distal portion. The skin in that area is extremely tender to touch. There is some swelling and induration in that area as well. Past Medical History:  Diagnosis Date  . Arthritis   . Asthma   . Chronic bronchitis ([place])   . GERD (gastroesophageal reflux disease)   . Hypercholesteremia   . Hypertension   . OSA treated with BiPAP    ahi-132, bipap 23/19   Past Surgical History:  Procedure Laterality Date  . ESOPHAGOGASTRODUODENOSCOPY (EGD) WITH PROPOFOL N/A 12/11/2012   Procedure: ESOPHAGOGASTRODUODENOSCOPY (EGD) WITH PROPOFOL;  Surgeon: Arta Silence, MD;  Location: [facility];  Service: Endoscopy;  Laterality: N/A;  . FINGER AMPUTATION    . KNEE ARTHROSCOPY Left 11/15/2015   Procedure: ARTHROSCOPY KNEE AND MEDIAL MENISECTOMY;  Surgeon: Paralee Cancel, MD;  Location: [facility];  Service: Orthopedics;  Laterality: Left;   Current Outpatient Prescriptions on File Prior to Visit  Medication Sig Dispense Refill  . aspirin 81 MG tablet Take 81 mg by mouth daily.    Marland Kitchen atorvastatin (LIPITOR) 10 MG tablet Take 1 tablet (10 mg total) by mouth daily. 90 tablet 3  . Blood Glucose Monitoring Suppl (ONETOUCH VERIO) w/Device KIT     . empagliflozin (JARDIANCE) 25 MG TABS tablet Take 25 mg by mouth daily. 30 tablet 5  . furosemide (LASIX) 20 MG tablet TAKE 1 TABLET BY MOUTH DAILY. 30 tablet 5  . Lancet Devices (LANCING DEVICE) MISC OneTouch Engineer, agricultural Device. 1 each 2  . lansoprazole (PREVACID) 30 MG capsule TAKE 1 CAPSULE BY MOUTH DAILY AT 12:00 NOON. 90 capsule 1  . meloxicam (MOBIC) 15 MG tablet Take 1 tablet (15 mg total) by mouth daily. 30 tablet 4  . metFORMIN (GLUCOPHAGE) 500 MG tablet Take 1 tablet (500 mg total) by mouth  2 (two) times daily with a meal. 180 tablet 3  . nitroGLYCERIN (NITROSTAT) 0.4 MG SL tablet Place 1 tablet (0.4 mg total) under the tongue every 5 (five) minutes as needed. For chest pain 20 tablet 1  . omeprazole (PRILOSEC) 40 MG capsule     . ONETOUCH DELICA LANCETS 60V MISC     . ONETOUCH VERIO test strip     . PROAIR HFA 108 (90 BASE) MCG/ACT inhaler INHALE 2 PUFFS INTO THE LUNGS EVERY 4 HOURS AS NEEDED FOR SHORTNESS OF BREATH 8.5 g 2  . topiramate (TOPAMAX) 25 MG tablet Take 2 tablets (50 mg total) by mouth 2 (two) times daily. 120 tablet 1  . UNABLE TO FIND Lidocaine ointment 5%     Current Facility-Administered Medications on File Prior to Visit  Medication Dose Route Frequency Provider Last Rate Last Dose  . lactated ringers infusion    Continuous PRN Chyrel Masson, CRNA       No Known Allergies Social History   Social History  . Marital status: Divorced    Spouse name: N/A  . Number of children: 0  . Years of education: N/A   Occupational History  . Not on file.   Social History Main Topics  . Smoking status: Current Every Day Smoker    Packs/day: 1.00    Years: 40.00    Types: Cigarettes  .  Smokeless tobacco: Never Used  . Alcohol use 0.0 oz/week     Comment: some  . Drug use: Yes    Types: Cocaine     Comment: patient states none- read lab report 09/02/2012/ 11-20-12 case cx.due to positive screen test-pt. advised no recreational drugs to be used  . Sexual activity: Not on file   Other Topics Concern  . Not on file   Social History Narrative   Lives with brother and his sister in [place].        Review of Systems  All other systems reviewed and are negative.      Objective:   Physical Exam  Constitutional: He appears well-developed and well-nourished.  Cardiovascular: Normal rate, regular rhythm and normal heart sounds.   Pulmonary/Chest: Effort normal and breath sounds normal. No respiratory distress. He has no wheezes. He has no rales.  Vitals  reviewed.   Left thumb. 3 mm diameter accumulation of pus under the distal central portion of the thumbnail with surrounding cellulitis and swelling      Assessment & Plan:  Paronychia of finger, left  I achieved anesthesia using a digital block with 0.1% lidocaine without epinephrine. A tourniquet was then applied to the base of the thumb. Using a metal elevator, the affected portion of the thumbnail was separated from the underlying nailbed. The metal elevator was introduced approximately 5 mm into the space between the nail and the underlying nail bed. This created a potential space where a scalpel was introduced lancing the pustule and <1 mm of yellow purulent pus was expressed from the space.  The wound was then dressed with Neosporin and a Band-Aid. Wound care was discussed. Begin Bactrim double strength tablets twice daily for 7 days. Recheck immediately if worsening.

## 2016-09-06 ENCOUNTER — Ambulatory Visit: Payer: Medicare Other | Admitting: Family Medicine

## 2016-09-19 DIAGNOSIS — G4733 Obstructive sleep apnea (adult) (pediatric): Secondary | ICD-10-CM | POA: Diagnosis not present

## 2016-09-25 ENCOUNTER — Other Ambulatory Visit: Payer: Self-pay | Admitting: Family Medicine

## 2016-09-25 DIAGNOSIS — G4452 New daily persistent headache (NDPH): Secondary | ICD-10-CM

## 2016-10-20 ENCOUNTER — Encounter: Payer: Self-pay | Admitting: Family Medicine

## 2016-10-20 DIAGNOSIS — E119 Type 2 diabetes mellitus without complications: Secondary | ICD-10-CM | POA: Diagnosis not present

## 2016-10-20 DIAGNOSIS — H35033 Hypertensive retinopathy, bilateral: Secondary | ICD-10-CM | POA: Diagnosis not present

## 2016-10-20 DIAGNOSIS — H2513 Age-related nuclear cataract, bilateral: Secondary | ICD-10-CM | POA: Diagnosis not present

## 2016-10-20 DIAGNOSIS — H25013 Cortical age-related cataract, bilateral: Secondary | ICD-10-CM | POA: Diagnosis not present

## 2016-10-20 LAB — HM DIABETES EYE EXAM

## 2016-11-14 ENCOUNTER — Ambulatory Visit (INDEPENDENT_AMBULATORY_CARE_PROVIDER_SITE_OTHER): Payer: Medicare Other | Admitting: Family Medicine

## 2016-11-14 ENCOUNTER — Encounter: Payer: Self-pay | Admitting: Family Medicine

## 2016-11-14 VITALS — BP 144/70 | HR 86 | Temp 98.4°F | Resp 20 | Ht 66.0 in | Wt 286.0 lb

## 2016-11-14 DIAGNOSIS — M7552 Bursitis of left shoulder: Secondary | ICD-10-CM

## 2016-11-14 DIAGNOSIS — E119 Type 2 diabetes mellitus without complications: Secondary | ICD-10-CM | POA: Diagnosis not present

## 2016-11-14 NOTE — Progress Notes (Signed)
Subjective:     Patient ID: Kenneth [name], male   DOB: [birth year], 56 y.o.   MRN: [id number]  HPI Patient reports one month of pain in his left shoulder. Patient reports no specific injury. However it hurts to abduct his shoulder greater than 90. It hurts to sleep on the shoulder at night. Is no crepitus in the shoulder. He has a positive empty can sign. He has a positive Hawkins sign. He has pain with passive range of motion and abduction greater than 120. There is no pain with internal or external rotation. He is also questioning when he can discontinue Jardiance.  Is currently on a combination of that with metformin. Last hemoglobin A1c was checked in April and was excellent at 5.3. He is not regularly checking his blood sugars. He denies any symptoms of hypoglycemia. Past Medical History:  Diagnosis Date  . Arthritis   . Asthma   . Chronic bronchitis ([place])   . GERD (gastroesophageal reflux disease)   . Hypercholesteremia   . Hypertension   . OSA treated with BiPAP    ahi-132, bipap 23/19   Past Surgical History:  Procedure Laterality Date  . ESOPHAGOGASTRODUODENOSCOPY (EGD) WITH PROPOFOL N/A 12/11/2012   Procedure: ESOPHAGOGASTRODUODENOSCOPY (EGD) WITH PROPOFOL;  Surgeon: Arta Silence, MD;  Location: [facility];  Service: Endoscopy;  Laterality: N/A;  . FINGER AMPUTATION    . KNEE ARTHROSCOPY Left 11/15/2015   Procedure: ARTHROSCOPY KNEE AND MEDIAL MENISECTOMY;  Surgeon: Paralee Cancel, MD;  Location: [facility];  Service: Orthopedics;  Laterality: Left;   Current Outpatient Prescriptions on File Prior to Visit  Medication Sig Dispense Refill  . aspirin 81 MG tablet Take 81 mg by mouth daily.    Marland Kitchen atorvastatin (LIPITOR) 10 MG tablet Take 1 tablet (10 mg total) by mouth daily. 90 tablet 3  . Blood Glucose Monitoring Suppl (ONETOUCH VERIO) w/Device KIT     . empagliflozin (JARDIANCE) 25 MG TABS tablet Take 25 mg by mouth daily. 30 tablet 5  . furosemide (LASIX) 20 MG tablet TAKE 1 TABLET  BY MOUTH DAILY. 30 tablet 5  . Lancet Devices (LANCING DEVICE) MISC OneTouch Engineer, agricultural Device. 1 each 2  . lansoprazole (PREVACID) 30 MG capsule TAKE 1 CAPSULE BY MOUTH DAILY AT 12:00 NOON. 90 capsule 1  . meloxicam (MOBIC) 15 MG tablet Take 1 tablet (15 mg total) by mouth daily. 30 tablet 4  . metFORMIN (GLUCOPHAGE) 500 MG tablet Take 1 tablet (500 mg total) by mouth 2 (two) times daily with a meal. 180 tablet 3  . nitroGLYCERIN (NITROSTAT) 0.4 MG SL tablet Place 1 tablet (0.4 mg total) under the tongue every 5 (five) minutes as needed. For chest pain 20 tablet 1  . omeprazole (PRILOSEC) 40 MG capsule     . ONETOUCH DELICA LANCETS 96Q MISC     . ONETOUCH VERIO test strip     . PROAIR HFA 108 (90 BASE) MCG/ACT inhaler INHALE 2 PUFFS INTO THE LUNGS EVERY 4 HOURS AS NEEDED FOR SHORTNESS OF BREATH 8.5 g 2  . sulfamethoxazole-trimethoprim (BACTRIM DS,SEPTRA DS) 800-160 MG tablet Take 1 tablet by mouth 2 (two) times daily. 14 tablet 0  . topiramate (TOPAMAX) 25 MG tablet TAKE 2 TABLETS BY MOUTH 2 TIMES DAILY. 120 tablet 1  . UNABLE TO FIND Lidocaine ointment 5%     Current Facility-Administered Medications on File Prior to Visit  Medication Dose Route Frequency Provider Last Rate Last Dose  . lactated ringers infusion    Continuous PRN  Chyrel Masson, CRNA       No Known Allergies Social History   Social History  . Marital status: Divorced    Spouse name: N/A  . Number of children: 0  . Years of education: N/A   Occupational History  . Not on file.   Social History Main Topics  . Smoking status: Current Every Day Smoker    Packs/day: 1.00    Years: 40.00    Types: Cigarettes  . Smokeless tobacco: Never Used  . Alcohol use 0.0 oz/week     Comment: some  . Drug use: Yes    Types: Cocaine     Comment: patient states none- read lab report 09/02/2012/ 11-20-12 case cx.due to positive screen test-pt. advised no recreational drugs to be used  . Sexual activity: Not on file    Other Topics Concern  . Not on file   Social History Narrative   Lives with brother and his sister in [place].       Review of Systems  All other systems reviewed and are negative.      Objective:   Physical Exam  Cardiovascular: Normal rate, regular rhythm and normal heart sounds.   Pulmonary/Chest: Effort normal and breath sounds normal. No respiratory distress. He has no wheezes. He has no rales.  Musculoskeletal:       Left shoulder: He exhibits decreased range of motion, tenderness and pain. He exhibits no bony tenderness, no swelling, no effusion, no crepitus, no spasm and normal strength.  Vitals reviewed.      Assessment:     I believe the patient has subacromial bursitis in the left shoulder Controlled type 2 diabetes mellitus without complication, without long-term current use of insulin (HCC) - Plan: BASIC METABOLIC PANEL WITH GFR, Hemoglobin A1c      Plan:     Using sterile technique, I injected the left shoulder with a mixture of 2 mL of lidocaine, 2 mL of Marcaine, and 2 mL of 40 mg per mL Kenalog. Patient tolerated the procedure well without complication. I will also check a hemoglobin A1c. If his hemoglobin A1c is well below 6, I will discontinue Jardiance.

## 2016-11-15 ENCOUNTER — Other Ambulatory Visit: Payer: Self-pay | Admitting: Family Medicine

## 2016-11-15 LAB — BASIC METABOLIC PANEL WITH GFR
BUN: 18 mg/dL (ref 7–25)
CO2: 22 mmol/L (ref 20–32)
Calcium: 9.7 mg/dL (ref 8.6–10.3)
Chloride: 108 mmol/L (ref 98–110)
Creat: 0.92 mg/dL (ref 0.70–1.33)
GFR, Est African American: >89 mL/min (ref >=60)
GFR, Est Non African American: >89 mL/min (ref >=60)
Glucose, Bld: 95 mg/dL (ref 70–99)
Potassium: 4.6 mmol/L (ref 3.5–5.3)
Sodium: 139 mmol/L (ref 135–146)

## 2016-11-15 LAB — HEMOGLOBIN A1C
Hgb A1c MFr Bld: 5.5 % (ref <5.7)
Mean Plasma Glucose: 111 mg/dL

## 2016-11-21 ENCOUNTER — Telehealth: Payer: Self-pay

## 2016-11-21 ENCOUNTER — Ambulatory Visit: Payer: Medicare Other | Admitting: Neurology

## 2016-11-21 NOTE — Telephone Encounter (Signed)
Pt did not show for their appt with Dr. Athar today.  

## 2016-11-30 ENCOUNTER — Encounter: Payer: Self-pay | Admitting: Neurology

## 2016-12-04 ENCOUNTER — Encounter: Payer: Self-pay | Admitting: Neurology

## 2016-12-05 ENCOUNTER — Ambulatory Visit (INDEPENDENT_AMBULATORY_CARE_PROVIDER_SITE_OTHER): Payer: Medicare Other | Admitting: Neurology

## 2016-12-05 ENCOUNTER — Encounter: Payer: Self-pay | Admitting: Neurology

## 2016-12-05 VITALS — BP 145/87 | HR 77 | Ht 66.5 in | Wt 285.0 lb

## 2016-12-05 DIAGNOSIS — G4733 Obstructive sleep apnea (adult) (pediatric): Secondary | ICD-10-CM

## 2016-12-05 NOTE — Progress Notes (Signed)
Subjective:    Patient ID: Kenneth [name] is a 56 y.o. male.  HPI     Interim history:   Kenneth [name] is a 56 year old right-handed gentleman with an underlying medical history of hyperlipidemia, coronary artery disease, reflux disease, morbid obesity, arthritis, chronic bronchitis in the setting of smoking, obesity hypoventilation syndrome and syncopal event, who presents for follow-up consultation of his obstructive sleep apnea, on BiPAP therapy. The patient is unaccompanied today. He no showed for an appointment on 11/21/2016. I last saw him on 01/18/2016, at which time he was poorly compliant with BiPAP therapy with a percentage of 23%. He had recent left knee surgery under Dr. Alvan Dame on 11/15/2015. He was still smoking and was taking hydrocodone as needed.   He was seen in the interim by Ward Givens, NP, on 05/22/16, at which time his compliance was a little better in the lower 40s but he was again advised to be fully compliant with treatment.   Today, 12/05/2016: I reviewed his BiPAP compliance data from 11/05/2016 through 12/04/2016 which is a total of 30 days, during which time he used his BiPAP 21 days with percent used days greater than 4 hours at 40% only, indicating poor compliance with an average usage of 4 hours and 2 minutes for days on treatment, average AHI 1.3 per hour which is at goal, leak low with the 95th percentile at 1 L per minute, pressure of 29/19 cm.  I reviewed his BiPAP compliance data from 10/21/2016 through 11/19/2016, which is a total of 30 days, during which time he used his BiPAP only 21 days with percent used days greater than 4 hours at only 30%, indicating poor compliance, average usage of 3 hours and 40 minutes 4 days on treatment, average AHI at goal at 1.4 per hour, leak on the low side with the 95th percentile at 2.2 L/m on a pressure of 23/19 cm. In the past 90 days his compliance percentage was similar at 36%.  He reports L shoulder pain, better after steroid  injection under PCP. Still bothered by residual L knee pain too. intermittent congestion makes BiPAP use more difficult, but when he is able to sleep with it, he sleeps well and notices the difference. Trying to lose weight. Still smokes about 1/2 ppd.    The patient's allergies, current medications, family history, past medical history, past social history, past surgical history and problem list were reviewed and updated as appropriate.   Previously:   I saw him on 05/10/2015, at which time he was not fully compliant with BiPAP therapy. He reported needing new supplies. He was still smoking. He had lost about 10 pounds in 12 months. I asked him to be fully compliant with BiPAP therapy.: He reports needing new supplies. He admits to not using his machine very much. Has no issues using it though. He sleeps better with it, he admits. He still smokes. He has lost a little bit of weight. Between February 2016 and February this year, he has lost about 10 pounds. We talked about his BiPAP titration study from 10/15/2014.   I reviewed his BiPAP compliance data from 12/18/2015 through 01/16/2016, which is a total of 30 days, during which time he used his BiPAP only 18 days with percent used days greater than 4 hours at 23%, indicating poor compliance with an average daily usage of only 2 hours and 15 minutes, residual AHI 1 per hour, leak low with the 95th percentile at 7.7 L/m on a pressure  of 23/19 cm. In the past 90 days his compliance percentage was 24%.   I saw him on 07/30/2014, at which time we talked about his auto BiPAP therapy. He was compliant with treatment but I suggested we bring him back for a full night titration study to optimize treatment setting and determine a set pressure. He had a CPAP titration study on 08/04/2014, this was followed by a BiPAP titration study on 10/15/2014. I went over his test results with him in detail today. His CPAP titration study from 08/04/2014 showed that his sleep  apnea was not appropriately treated with CPAP. He was then switched to BiPAP therapy but an optimal pressure was not determined during that study. CPAP was titrated from 5 cm to 16 cm but his AHI was 20 per hour while on CPAP. BiPAP was started at 18/14 and titrated to 22/18 but he had ongoing respiratory events, a brief trial of BiPAP ST was not successful during that study. Sleep efficiency was 79.4%. Sleep latency 7 minutes, wake after sleep onset was 89 minutes with mild to moderate sleep fragmentation noted. Average oxygen saturation was 92%, nadir was 83%. Based on unsuccessful treatment with CPAP therapy and suboptimal treatment with BiPAP therapy during the study I asked him to return for another full night BiPAP titration study. He had this on 10/15/2014. Sleep efficiency was 83.8%, latency to sleep 6 minutes, wake after sleep onset was 63 minutes with moderate sleep fragmentation noted. He had an elevated arousal index. He had increased percentages of stage I and stage II sleep, absence of slow-wave sleep and a REM percentage of 17.9% with a reduced REM latency of 24 minutes. He had mild PLMS with an index of 10.2 per hour, resulting in 3.9 arousals per hour. BiPAP was started at 8 over 4 cm and titrated to 23/19 cm. AHI was 0 per hour at that pressure. Supplemental oxygen was not utilized. On the final pressure, his desaturation nadir was 90%, supine REM sleep was achieved during the study but not on the final pressure. He indicated that he slept better than usual. Based on his test results are prescribed BiPAP therapy for home use with a setting of 29/19 cm via full facemask.   I reviewed his BiPAP compliance data from 04/05/2015 through 05/04/2015 which is a total of 30 days during which time he used his machine only 8 days with percent used days greater than 4 hours at 13% only, indicating poor compliance with an average usage of 1 hour and 17 minutes only. Residual AHI 3.1 per hour, leak low. Set  pressure of 23/19 cm.   I first met him on 05/22/2014 at the request of his primary care physician, at which time the patient reported snoring and daytime somnolence. I suggested he return for sleep study. He had a split-night sleep study on 05/25/2014 and went over his test results with him in detail today. Baseline sleep efficiency was 77.3% with a latency to sleep of 0 minutes and wake after sleep onset of 35.5 minutes with severe sleep fragmentation noted. He had an elevated arousal index. He had absence of slow-wave sleep and absence of REM sleep prior to CPAP. He had had rare pauses on his EKG, about 3 seconds long. He had moderate to loud snoring. His total AHI was 119 per hour. This line oxygen saturation was 89%, nadir was 76%. He was then titrated on CPAP from 5-15 cm and then placed on BiPAP therapy and titrated from 16/12 cm  to 24/20 cm. He did not have resolution of his sleep disordered breathing while on CPAP or BiPAP standard. I suggested Auto BiPAP at home and also suggested he undergo pulse oximetry testing while on BiPAP therapy at home.    I reviewed his BiPAP compliance data from 06/28/2014 through 07/27/2014 which is a total of 30 days during which time he used his machine only 22 days. Percent used days greater than 4 hours was 70%, indicating borderline/adequate compliance. Average usage for all nights was 4 hours and 54 minutes. He is on BiPAP with maximum IPAP of 25 cm, minimum EPAP of 14 cm and pressure support of 4. Average AHI was 3.3 per hour, adequate. Leak was acceptable with the 95th percentile at 13.1 L/m. 95th percentile of pressure appears to be 21/17.   I spoke with his cardiologist on the phone on 05/19/14; the patient was having some 5-6 s pauses. His Holter monitor was placed earlier this month. On 04/30/2014 he had a syncopal spell. He was in the emergency room for this. He has seen Dr. Percival Spanish for cardiac workup. He has been advised to have a sleep study. He snores, he  wakes up not refreshed and he has multiple nighttime awakenings and nocturia at least 4-5 times per night. His Epworth sleepiness score is 19 out of 24. He goes to bed around 9 PM and while he falls asleep fairly quickly he wakes up almost every hour he says. Sometimes he gets out of bed around 3 AM and never goes back to sleep. He denies restless leg symptoms but has some aching in his legs. He has lower extremity swelling. He wakes up with a headache at times. He is not aware of any family history of obstructive sleep apnea. He has never had a sleep study. He has been overweight for years. He snores and this is at times quite loud. [name] not sure if he actually quits breathing in his sleep but has woken up with a sense of gasping for air. He drinks about 2 Coca-Cola as per day. He does not drink coffee. He occasionally drinks alcohol. He smokes one pack per day.   He lives with his brother and his sister-in-law. He is divorced. He does not have any children. There are no pets in his bed. Occasionally he watches TV in bed. He does not work.    His Past Medical History Is Significant For: Past Medical History:  Diagnosis Date  . Arthritis   . Asthma   . Chronic bronchitis ([place])   . GERD (gastroesophageal reflux disease)   . Hypercholesteremia   . Hypertension   . OSA treated with BiPAP    ahi-132, bipap 23/19    His Past Surgical History Is Significant For: Past Surgical History:  Procedure Laterality Date  . ESOPHAGOGASTRODUODENOSCOPY (EGD) WITH PROPOFOL N/A 12/11/2012   Procedure: ESOPHAGOGASTRODUODENOSCOPY (EGD) WITH PROPOFOL;  Surgeon: Arta Silence, MD;  Location: [facility];  Service: Endoscopy;  Laterality: N/A;  . FINGER AMPUTATION    . KNEE ARTHROSCOPY Left 11/15/2015   Procedure: ARTHROSCOPY KNEE AND MEDIAL MENISECTOMY;  Surgeon: Paralee Cancel, MD;  Location: [facility];  Service: Orthopedics;  Laterality: Left;    His Family History Is Significant For: Family History  Problem  Relation Age of Onset  . Hypertension Father   . Diabetes Father   . Diabetes Unknown        Multiple family members  . Diabetes Sister   . Diabetes Brother   . Stroke Sister  His Social History Is Significant For: Social History   Social History  . Marital status: Divorced    Spouse name: N/A  . Number of children: 0  . Years of education: N/A   Social History Main Topics  . Smoking status: Current Every Day Smoker    Packs/day: 1.00    Years: 40.00    Types: Cigarettes  . Smokeless tobacco: Never Used  . Alcohol use 0.0 oz/week     Comment: some  . Drug use: Yes    Types: Cocaine     Comment: patient states none- read lab report 09/02/2012/ 11-20-12 case cx.due to positive screen test-pt. advised no recreational drugs to be used  . Sexual activity: Not Asked   Other Topics Concern  . None   Social History Narrative   Lives with brother and his sister in [place]      His Allergies Are:  No Known Allergies:   His Current Medications Are:  Outpatient Encounter Prescriptions as of 12/05/2016  Medication Sig  . aspirin 81 MG tablet Take 81 mg by mouth daily.  Marland Kitchen atorvastatin (LIPITOR) 10 MG tablet Take 1 tablet (10 mg total) by mouth daily.  . Blood Glucose Monitoring Suppl (ONETOUCH VERIO) w/Device KIT   . furosemide (LASIX) 20 MG tablet TAKE 1 TABLET BY MOUTH DAILY.  Marland Kitchen lansoprazole (PREVACID) 30 MG capsule TAKE 1 CAPSULE BY MOUTH DAILY AT 12:00 NOON.  . meloxicam (MOBIC) 15 MG tablet Take 1 tablet (15 mg total) by mouth daily.  . metFORMIN (GLUCOPHAGE) 500 MG tablet Take 1 tablet (500 mg total) by mouth 2 (two) times daily with a meal.  . nitroGLYCERIN (NITROSTAT) 0.4 MG SL tablet Place 1 tablet (0.4 mg total) under the tongue every 5 (five) minutes as needed. For chest pain  . omeprazole (PRILOSEC) 40 MG capsule   . PROAIR HFA 108 (90 BASE) MCG/ACT inhaler INHALE 2 PUFFS INTO THE LUNGS EVERY 4 HOURS AS NEEDED FOR SHORTNESS OF BREATH  . sulfamethoxazole-trimethoprim  (BACTRIM DS,SEPTRA DS) 800-160 MG tablet Take 1 tablet by mouth 2 (two) times daily.  Marland Kitchen topiramate (TOPAMAX) 25 MG tablet TAKE 2 TABLETS BY MOUTH 2 TIMES DAILY.  Marland Kitchen UNABLE TO FIND Lidocaine ointment 5%  . [DISCONTINUED] ONETOUCH DELICA LANCETS 00X MISC   . [DISCONTINUED] ONETOUCH VERIO test strip   . [DISCONTINUED] Lancet Devices (LANCING DEVICE) MISC OneTouch Delica Lancing Device.   Facility-Administered Encounter Medications as of 12/05/2016  Medication  . lactated ringers infusion  :  Review of Systems:  Out of a complete 14 point review of systems, all are reviewed and negative with the exception of these symptoms as listed below: Review of Systems  Neurological:       Pt presents today to follow up on his cpap. Pt is using [employer] for his DME.    Objective:  Neurological Exam  Physical Exam Physical Examination:   Vitals:   12/05/16 0849  BP: (!) 145/87  Pulse: 77    General Examination: The patient is a very pleasant 57 y.o. male in no acute distress. He appears well-developed and well-nourished and adequately groomed.   HEENT: Normocephalic, atraumatic, pupils are equal, round and reactive to light and accommodation. Conjunctivae are dry and irritated appearing, stable. Extraocular tracking is good without limitation to gaze excursion or nystagmus noted. Normal smooth pursuit is noted. Hearing is grossly intact. Face is symmetric with normal facial animation and normal facial sensation. Speech is clear with no dysarthria noted. There is no hypophonia. There  is no lip, neck/head, jaw or voice tremor. Neck is supple with full range of passive and active motion. There are no carotid bruits on auscultation. Oropharynx exam reveals: moderate mouth dryness, adequate dental hygiene with s/p tooth extractions in the b/l lower jaw and L upper jaw, and marked airway crowding. Mild clear nasal discharge. Mallampati is class III. Tongue protrudes centrally and palate elevates  symmetrically.    Chest: Clear to auscultation without wheezing, rhonchi or crackles noted.  Heart: S1+S2+0, regular and normal without murmurs, rubs or gallops noted.   Abdomen: Soft, non-tender and non-distended with normal bowel sounds appreciated on auscultation.  Extremities: There is no pitting edema in the distal lower extremities bilaterally, better. Chronic discolorations are seen.   Skin: Dry appearing especially in the distal lower extremities. There are no varicose veins.  Musculoskeletal: exam reveals no obvious joint deformities, tenderness or joint swelling or erythema, with the exception of pain and decreased range of motion of his left knee.   Neurologically:  Mental status: The patient is awake, alert and oriented in all 4 spheres. His immediate and remote memory, attention, language skills and fund of knowledge are appropriate. There is no evidence of aphasia, agnosia, apraxia or anomia. Speech is clear with normal prosody and enunciation. Thought process is linear. Mood is normal and affect is normal.  Cranial nerves II - XII are as described above under HEENT exam. In addition: shoulder shrug is normal with equal shoulder height noted. Motor exam: Normal bulk, strength and tone is noted. There is no drift, tremor or rebound. Romberg is negative. Reflexes are 1+ throughout, trace R knee, did not check L knee, 1+ both ankles. Fine motor skills and coordination: grossly intact. Cerebellar testing: No dysmetria or intention tremor. There is no truncal or gait ataxia.  Sensory exam: intact to light touch in the upper and lower extremities.  Gait, station and balance: He stands with difficulty, reports L knee pain. No veering to one side is noted. No leaning to one side is noted. Posture is age-appropriate and stance is wide-based, walks with mild limp on the L, no cane.             Assessment and Plan:   In summary, KNUTE MAZZUCA is a very pleasant 56 year old male with  an underlying complex medical history of hyperlipidemia, coronary artery disease, reflux disease, morbid obesity, arthritis, chronic bronchitis in the setting of smoking, obesity hypoventilation syndrome, syncope (seen by cardiology), who presents for follow-up consultation of his severe obstructive sleep apnea with evidence of significant nocturnal desaturations and nocturnal hypoxemia, in the context of ongoing smoking, and morbid obesity. He was compliant in the beginning of his treatment. Since then he has declined in his treatment adherence but has had intermittent difficulties including pain and recurrent congestion. He has been trying to lose weight and has in fact lost about 18 pounds since December 2017. He is encouraged to continue to work on it. He is furthermore strongly advised to try to quit smoking. I had another long chat with the patient about his sleep study findings from May 2016 as well as July 2016. His original split-night sleep study from 05/25/2014 showed an AHI of 119 per hour (supine AHI was 132/hour), absence of REM sleep and O2 nadir of 76%. He is again advised that he has severe obstructive sleep apnea. He responded reasonably well on BiPAP therapy eventually, albeit on fairly high pressure setting, which does not bother him and when he is  able to use his BiPAP he still endorses better sleep quality and better daytime energy. He is again advised about the sinister complications of untreated severe obstructive sleep apnea, especially with respect to developing cardiovascular disease down the Road, including congestive heart failure, difficult to treat hypertension, cardiac arrhythmias, or stroke. Even type 2 diabetes has, in part, been linked to untreated OSA. Symptoms of untreated OSA include daytime sleepiness, memory problems, mood irritability and mood disorder such as depression and anxiety, lack of energy, as well as recurrent headaches, especially morning headaches. We talked  about smoking cessation and trying to maintain a healthy lifestyle in general, as well as the importance of weight control.  We reviewed his recent compliance data and he is advised to be fully compliant with BiPAP therapy. I suggested a 6 mo FU with NP.   I spent 25 minutes in total face-to-face time with the patient, more than 50% of which was spent in counseling and coordination of care, reviewing test results, reviewing medication and discussing or reviewing the diagnosis of OSA, its prognosis and treatment options. Pertinent laboratory and imaging test results that were available during this visit with the patient were reviewed by me and considered in my medical decision making (see chart for details).

## 2016-12-05 NOTE — Patient Instructions (Addendum)
Please continue using your BiPAP regularly. While your insurance requires that you use PAP at least 4 hours each night on 70% of the nights, I recommend, that you not skip any nights and use it throughout the night if you can. Getting used to PAP and staying with the treatment long term does take time and patience and discipline. Untreated obstructive sleep apnea when it is moderate to severe can have an adverse impact on cardiovascular health and raise her risk for heart disease, arrhythmias, hypertension, congestive heart failure, stroke and diabetes. Untreated obstructive sleep apnea causes sleep disruption, nonrestorative sleep, and sleep deprivation. This can have an impact on your day to day functioning and cause daytime sleepiness and impairment of cognitive function, memory loss, mood disturbance, and problems focussing. Using PAP regularly can improve these symptoms. You were at a compliance percentage of 70% back in 2016; you can do it! You have lost weight in the past 9 month, keep working on it! We will do a 6 month recheck with Ward Givens, NP.

## 2016-12-08 ENCOUNTER — Ambulatory Visit: Payer: Medicare Other | Admitting: Family Medicine

## 2016-12-11 ENCOUNTER — Ambulatory Visit: Payer: Medicare Other | Admitting: Family Medicine

## 2016-12-14 ENCOUNTER — Ambulatory Visit: Payer: Medicare Other | Admitting: Family Medicine

## 2016-12-14 ENCOUNTER — Other Ambulatory Visit: Payer: Self-pay | Admitting: Family Medicine

## 2016-12-14 MED ORDER — OMEPRAZOLE 40 MG PO CPDR: 40.0000 mg | DELAYED_RELEASE_CAPSULE | Freq: Every day | ORAL | 3 refills | Status: DC

## 2016-12-14 NOTE — Telephone Encounter (Signed)
Pt needs refill on prilosec, sent to [facility].

## 2016-12-14 NOTE — Telephone Encounter (Signed)
Prescription sent to pharmacy.

## 2016-12-19 DIAGNOSIS — G4733 Obstructive sleep apnea (adult) (pediatric): Secondary | ICD-10-CM | POA: Diagnosis not present

## 2016-12-20 ENCOUNTER — Encounter: Payer: Self-pay | Admitting: Family Medicine

## 2016-12-22 ENCOUNTER — Encounter: Payer: Self-pay | Admitting: Family Medicine

## 2016-12-22 ENCOUNTER — Encounter: Payer: Medicare Other | Admitting: Family Medicine

## 2016-12-25 NOTE — Progress Notes (Signed)
This encounter was created in error - please disregard.

## 2017-01-04 ENCOUNTER — Ambulatory Visit: Payer: Medicare Other | Admitting: Neurology

## 2017-01-24 ENCOUNTER — Other Ambulatory Visit: Payer: Self-pay | Admitting: Family Medicine

## 2017-02-05 ENCOUNTER — Other Ambulatory Visit: Payer: Self-pay | Admitting: Family Medicine

## 2017-02-05 DIAGNOSIS — G4452 New daily persistent headache (NDPH): Secondary | ICD-10-CM

## 2017-03-16 ENCOUNTER — Ambulatory Visit: Payer: Medicare Other | Admitting: Family Medicine

## 2017-03-21 DIAGNOSIS — G4733 Obstructive sleep apnea (adult) (pediatric): Secondary | ICD-10-CM | POA: Diagnosis not present

## 2017-05-04 ENCOUNTER — Encounter (HOSPITAL_COMMUNITY): Payer: Self-pay | Admitting: Family Medicine

## 2017-05-04 ENCOUNTER — Ambulatory Visit (HOSPITAL_COMMUNITY)
Admission: EM | Admit: 2017-05-04 | Discharge: 2017-05-04 | Disposition: A | Discharge status: Home / Self Care | Payer: Medicare Other | Attending: Internal Medicine | Admitting: Internal Medicine

## 2017-05-04 DIAGNOSIS — R6884 Jaw pain: Secondary | ICD-10-CM

## 2017-05-04 DIAGNOSIS — H9202 Otalgia, left ear: Secondary | ICD-10-CM | POA: Diagnosis not present

## 2017-05-04 MED ORDER — MELOXICAM 15 MG PO TABS
15.0000 mg | ORAL_TABLET | Freq: Every day | ORAL | 0 refills | Status: DC
Start: 2017-05-04 — End: 2018-03-28

## 2017-05-04 MED ORDER — FLUTICASONE PROPIONATE 50 MCG/ACT NA SUSP: 1.0000 | Freq: Every day | NASAL | 2 refills | Status: DC

## 2017-05-04 NOTE — Discharge Instructions (Signed)
Please wear mouth guard at night as this may be exacerbating your pain. Daily nasal spray as well as meloxicam- please check to see if you are already taking this. Soft foods for the next week.  Follow up with your primary care provider in the next 1-2 weeks for recheck.

## 2017-05-04 NOTE — ED Triage Notes (Signed)
Pt here for left ear pain x a few weeks. radiation into left facial area. Hurts with chewing and eating.

## 2017-05-04 NOTE — ED Provider Notes (Signed)
[place]    CSN: [id number] Arrival date & time: 05/04/17  1001     History   Chief Complaint Chief Complaint  Patient presents with  . Otalgia    HPI Kenneth [name] is a 57 y.o. male.   Kenneth [name] presents with complaints of left jaw and ear pain which has been ongoing for the past few weeks. Worse at night, wakes him up and he can't sleep. No known fevers. Without cough or congestion. Sleeps with a cpap. without sore throat, without change in hearing. Has not taken any medications for symptoms. Doesn't know if he takes daily meloxicam or not, which is on his med list. No known injuries. States pain is fine currently, but worsens with opening of the jaw. History of asthma, gerd, htn, osa.    ROS per HPI.       Past Medical History:  Diagnosis Date  . Arthritis   . Asthma   . Chronic bronchitis ([place])   . GERD (gastroesophageal reflux disease)   . Hypercholesteremia   . Hypertension   . OSA treated with BiPAP    ahi-132, bipap 23/19    Patient Active Problem List   Diagnosis Date Noted  . OSA treated with BiPAP   . Syncope 05/13/2014  . Asthma   . Knee pain   . Chest pain 10/23/2011  . Dysphagia 10/23/2011  . Hyperlipidemia 10/23/2011  . Obesity 10/23/2011    Past Surgical History:  Procedure Laterality Date  . ESOPHAGOGASTRODUODENOSCOPY (EGD) WITH PROPOFOL N/A 12/11/2012   Procedure: ESOPHAGOGASTRODUODENOSCOPY (EGD) WITH PROPOFOL;  Surgeon: Arta Silence, MD;  [facility];  Service: Endoscopy;  Laterality: N/A;  . FINGER AMPUTATION    . KNEE ARTHROSCOPY Left 11/15/2015   Procedure: ARTHROSCOPY KNEE AND MEDIAL MENISECTOMY;  Surgeon: Paralee Cancel, MD;  Location: [facility];  Service: Orthopedics;  Laterality: Left;       Home Medications    Prior to Admission medications   Medication Sig Start Date End Date Taking? Authorizing Provider  aspirin 81 MG tablet Take 81 mg by mouth daily.    [provider]  atorvastatin (LIPITOR)  10 MG tablet Take 1 tablet (10 mg total) by mouth daily. 08/30/16   [name], MD  Blood Glucose Monitoring Suppl (ONETOUCH VERIO) w/Device KIT  01/28/16   [provider]  fluticasone (FLONASE) 50 MCG/ACT nasal spray Place 1 spray into both nostrils daily. 05/04/17   Zigmund Gottron, NP  furosemide (LASIX) 20 MG tablet TAKE 1 TABLET BY MOUTH DAILY. 02/15/16   [name], MD  lansoprazole (PREVACID) 30 MG capsule TAKE 1 CAPSULE BY MOUTH DAILY AT 12:00 NOON. 07/16/14   Dixon, Lonie Peak, PA-C  meloxicam (MOBIC) 15 MG tablet TAKE 1 TABLET BY MOUTH DAILY. 01/24/17   [name], MD  meloxicam (MOBIC) 15 MG tablet Take 1 tablet (15 mg total) by mouth daily. 05/04/17   Zigmund Gottron, NP  metFORMIN (GLUCOPHAGE) 500 MG tablet Take 1 tablet (500 mg total) by mouth 2 (two) times daily with a meal. 01/28/16   [name], MD  nitroGLYCERIN (NITROSTAT) 0.4 MG SL tablet Place 1 tablet (0.4 mg total) under the tongue every 5 (five) minutes as needed. For chest pain 03/01/15   Alycia Rossetti, MD  omeprazole (PRILOSEC) 40 MG capsule Take 1 capsule (40 mg total) by mouth daily. 12/14/16   [name], MD  PROAIR HFA 108 (90 BASE) MCG/ACT inhaler INHALE 2 PUFFS INTO THE  LUNGS EVERY 4 HOURS AS NEEDED FOR SHORTNESS OF BREATH 03/12/14   Dena Billet B, PA-C  sulfamethoxazole-trimethoprim (BACTRIM DS,SEPTRA DS) 800-160 MG tablet Take 1 tablet by mouth 2 (two) times daily. 09/05/16   [name], MD  topiramate (TOPAMAX) 25 MG tablet TAKE 2 TABLETS BY MOUTH 2 TIMES DAILY. 02/05/17   [name], MD  UNABLE TO FIND Lidocaine ointment 5%    [provider]    Family History Family History  Problem Relation Age of Onset  . Hypertension Father   . Diabetes Father   . Diabetes Unknown        Multiple family members  . Diabetes Sister   . Diabetes Brother   . Stroke Sister     Social History Social History   Tobacco Use  . Smoking status: Current Every Day  Smoker    Packs/day: 1.00    Years: 40.00    Pack years: 40.00    Types: Cigarettes  . Smokeless tobacco: Never Used  Substance Use Topics  . Alcohol use: Yes    Alcohol/week: 0.0 oz    Comment: some  . Drug use: Yes    Types: Cocaine    Comment: patient states none- read lab report 09/02/2012/ 11-20-12 case cx.due to positive screen test-pt. advised no recreational drugs to be used     Allergies   Patient has no known allergies.   Review of Systems Review of Systems   Physical Exam Triage Vital Signs ED Triage Vitals  Enc Vitals Group     BP 05/04/17 1013 135/84     Pulse Rate 05/04/17 1013 85     Resp 05/04/17 1013 18     Temp 05/04/17 1013 98.5 F (36.9 C)     Temp src --      SpO2 05/04/17 1013 96 %     Weight --      Height --      Head Circumference --      Peak Flow --      Pain Score 05/04/17 1011 4     Pain Loc --      Pain Edu? --      Excl. in [place]? --    No data found.  Updated Vital Signs BP 135/84   Pulse 85   Temp 98.5 F (36.9 C)   Resp 18   SpO2 96%   Visual Acuity Right Eye Distance:   Left Eye Distance:   Bilateral Distance:    Right Eye Near:   Left Eye Near:    Bilateral Near:     Physical Exam  Constitutional: He is oriented to person, place, and time. He appears well-developed and well-nourished.  HENT:  Head: Normocephalic and atraumatic.    Right Ear: Tympanic membrane, external ear and ear canal normal.  Left Ear: Tympanic membrane, external ear and ear canal normal.  Nose: Nose normal. Right sinus exhibits no maxillary sinus tenderness and no frontal sinus tenderness. Left sinus exhibits no maxillary sinus tenderness and no frontal sinus tenderness.  Mouth/Throat: Uvula is midline, oropharynx is clear and moist and mucous membranes are normal.  Left jaw pain, worse with opening of jaw; without crepitus or malocclusion noted  Eyes: Conjunctivae are normal. Pupils are equal, round, and reactive to light.  Neck: Normal  range of motion.  Cardiovascular: Normal rate and regular rhythm.  Pulmonary/Chest: Effort normal and breath sounds normal.  Lymphadenopathy:    He has no cervical adenopathy.  Neurological: He is  alert and oriented to person, place, and time.  Skin: Skin is warm and dry.  Vitals reviewed.    UC Treatments / Results  Labs (all labs ordered are listed, but only abnormal results are displayed) Labs Reviewed - No data to display  EKG  EKG Interpretation None       Radiology No results found.  Procedures Procedures (including critical care time)  Medications Ordered in UC Medications - No data to display   Initial Impression / Assessment and Plan / UC Course  I have reviewed the triage vital signs and the nursing notes.  Pertinent labs & imaging results that were available during my care of the patient were reviewed by me and considered in my medical decision making (see chart for details).     Left ear Wnl on exam. Appears that jaw pain, likely TMJ contributing to ear pain. Ensure daily nsaid. Recommended mouth guard as pain worse at night. Soft foods. Follow up with PCP for recheck. Patient verbalized understanding and agreeable to plan.    Final Clinical Impressions(s) / UC Diagnoses   Final diagnoses:  Otalgia of left ear  Jaw pain    ED Discharge Orders        Ordered    meloxicam (MOBIC) 15 MG tablet  Daily     05/04/17 1023    fluticasone (FLONASE) 50 MCG/ACT nasal spray  Daily     05/04/17 1023       Controlled Substance Prescriptions [place] Controlled Substance Registry consulted? Not Applicable   Zigmund Gottron, NP 05/04/17 1028

## 2017-05-19 ENCOUNTER — Other Ambulatory Visit: Payer: Self-pay | Admitting: Family Medicine

## 2017-05-19 DIAGNOSIS — G4452 New daily persistent headache (NDPH): Secondary | ICD-10-CM

## 2017-05-22 ENCOUNTER — Ambulatory Visit (INDEPENDENT_AMBULATORY_CARE_PROVIDER_SITE_OTHER): Payer: Medicare Other | Admitting: Family Medicine

## 2017-05-22 ENCOUNTER — Encounter: Payer: Self-pay | Admitting: Family Medicine

## 2017-05-22 VITALS — BP 130/80 | HR 88 | Temp 98.0°F | Resp 18 | Ht 66.0 in | Wt 300.0 lb

## 2017-05-22 DIAGNOSIS — E119 Type 2 diabetes mellitus without complications: Secondary | ICD-10-CM

## 2017-05-22 DIAGNOSIS — M109 Gout, unspecified: Secondary | ICD-10-CM

## 2017-05-22 MED ORDER — PREDNISONE 20 MG PO TABS
ORAL_TABLET | ORAL | 0 refills | Status: DC
Start: 2017-05-22 — End: 2017-06-07

## 2017-05-22 NOTE — Progress Notes (Signed)
Subjective:    Patient ID: Kenneth [name], male    DOB: 1960/09/06, 57 y.o.   MRN: [id number]  HPI Patient has a history of gout.  He developed sudden onset of left knee pain a few days ago.  The knee is red hot and swollen.  It is tender to the touch.  He has pain with flexion and extension.  He denies any specific injury.  He denies any penetrating trauma to the knee.  He did not schedule the appointment for this reason however but he is also overdue for lab work to monitor his diabetes.  Unfortunately he is not fasting and therefore I cannot check his cholesterol Past Medical History:  Diagnosis Date  . Arthritis   . Asthma   . Chronic bronchitis ([place])   . GERD (gastroesophageal reflux disease)   . Hypercholesteremia   . Hypertension   . OSA treated with BiPAP    ahi-132, bipap 23/19   Past Surgical History:  Procedure Laterality Date  . ESOPHAGOGASTRODUODENOSCOPY (EGD) WITH PROPOFOL N/A 12/11/2012   Procedure: ESOPHAGOGASTRODUODENOSCOPY (EGD) WITH PROPOFOL;  Surgeon: Arta Silence, MD;  [facility];  Service: Endoscopy;  Laterality: N/A;  . FINGER AMPUTATION    . KNEE ARTHROSCOPY Left 11/15/2015   Procedure: ARTHROSCOPY KNEE AND MEDIAL MENISECTOMY;  Surgeon: Paralee Cancel, MD;  [facility];  Service: Orthopedics;  Laterality: Left;   Current Outpatient Medications on File Prior to Visit  Medication Sig Dispense Refill  . aspirin 81 MG tablet Take 81 mg by mouth daily.    Marland Kitchen atorvastatin (LIPITOR) 10 MG tablet Take 1 tablet (10 mg total) by mouth daily. 90 tablet 3  . Blood Glucose Monitoring Suppl (ONETOUCH VERIO) w/Device KIT     . fluticasone (FLONASE) 50 MCG/ACT nasal spray Place 1 spray into both nostrils daily. 16 g 2  . furosemide (LASIX) 20 MG tablet TAKE 1 TABLET BY MOUTH DAILY. 30 tablet 5  . lansoprazole (PREVACID) 30 MG capsule TAKE 1 CAPSULE BY MOUTH DAILY AT 12:00 NOON. 90 capsule 1  . metFORMIN (GLUCOPHAGE) 500 MG tablet Take 1 tablet (500 mg total) by  mouth 2 (two) times daily with a meal. 180 tablet 3  . nitroGLYCERIN (NITROSTAT) 0.4 MG SL tablet Place 1 tablet (0.4 mg total) under the tongue every 5 (five) minutes as needed. For chest pain 20 tablet 1  . omeprazole (PRILOSEC) 40 MG capsule Take 1 capsule (40 mg total) by mouth daily. 90 capsule 3  . PROAIR HFA 108 (90 BASE) MCG/ACT inhaler INHALE 2 PUFFS INTO THE LUNGS EVERY 4 HOURS AS NEEDED FOR SHORTNESS OF BREATH 8.5 g 2  . UNABLE TO FIND Lidocaine ointment 5%    . meloxicam (MOBIC) 15 MG tablet Take 1 tablet (15 mg total) by mouth daily. (Patient not taking: Reported on 05/22/2017) 30 tablet 0  . topiramate (TOPAMAX) 25 MG tablet TAKE 2 TABLETS BY MOUTH 2 TIMES DAILY. (Patient not taking: Reported on 05/22/2017) 120 tablet 1   Current Facility-Administered Medications on File Prior to Visit  Medication Dose Route Frequency Provider Last Rate Last Dose  . lactated ringers infusion    Continuous PRN Chyrel Masson, CRNA       No Known Allergies Social History   Socioeconomic History  . Marital status: Divorced    Spouse name: Not on file  . Number of children: 0  . Years of education: Not on file  . Highest education level: Not on file  Social Needs  .  Financial resource strain: Not on file  . Food insecurity - worry: Not on file  . Food insecurity - inability: Not on file  . Transportation needs - medical: Not on file  . Transportation needs - non-medical: Not on file  Occupational History  . Not on file  Tobacco Use  . Smoking status: Current Every Day Smoker    Packs/day: 1.00    Years: 40.00    Pack years: 40.00    Types: Cigarettes  . Smokeless tobacco: Never Used  Substance and Sexual Activity  . Alcohol use: Yes    Alcohol/week: 0.0 oz    Comment: some  . Drug use: Yes    Types: Cocaine    Comment: patient states none- read lab report 09/02/2012/ 11-20-12 case cx.due to positive screen test-pt. advised no recreational drugs to be used  . Sexual activity:  Not on file  Other Topics Concern  . Not on file  Social History Narrative   Lives with brother and his sister in [place].        Review of Systems  All other systems reviewed and are negative.      Objective:   Physical Exam  Cardiovascular: Normal rate, regular rhythm and normal heart sounds.  Pulmonary/Chest: Effort normal and breath sounds normal. No respiratory distress. He has no wheezes. He has no rales. He exhibits no tenderness.  Musculoskeletal:       Left knee: He exhibits decreased range of motion, swelling, effusion and erythema. Tenderness found. Medial joint line and lateral joint line tenderness noted.  Vitals reviewed.         Assessment & Plan:  Acute gout of left knee, unspecified cause - Plan: Uric acid, predniSONE (DELTASONE) 20 MG tablet  Controlled type 2 diabetes mellitus without complication, without long-term current use of insulin (HCC) - Plan: CBC with Differential/Platelet, COMPLETE METABOLIC PANEL WITH GFR, Hemoglobin A1c, Microalbumin, urine  Patient appears to have a gout exacerbation in his left knee.  Begin prednisone taper pack and reassess in 48 hours.  I have a low index of suspicion for septic arthritis but if worsening, consider septic arthritis.  While the patient is here, I will obtain lab work to monitor his diabetes including hemoglobin A1c as well as a urine microalbumin.  Goal hemoglobin A1c is less than 6.5.

## 2017-05-23 LAB — COMPLETE METABOLIC PANEL WITH GFR
AG Ratio: 1.2 (calc) (ref 1.0–2.5)
ALT: 20 U/L (ref 9–46)
AST: 17 U/L (ref 10–35)
Albumin: 4.1 g/dL (ref 3.6–5.1)
Alkaline phosphatase (APISO): 64 U/L (ref 40–115)
BUN: 15 mg/dL (ref 7–25)
CO2: 27 mmol/L (ref 20–32)
Calcium: 9.3 mg/dL (ref 8.6–10.3)
Chloride: 107 mmol/L (ref 98–110)
Creat: 0.93 mg/dL (ref 0.70–1.33)
GFR, Est African American: 105 mL/min/{1.73_m2} (ref >=60)
GFR, Est Non African American: 91 mL/min/{1.73_m2} (ref >=60)
Globulin: 3.4 g/dL (calc) (ref 1.9–3.7)
Glucose, Bld: 134 mg/dL — ABNORMAL HIGH (ref 65–99)
Potassium: 4.7 mmol/L (ref 3.5–5.3)
Sodium: 141 mmol/L (ref 135–146)
Total Bilirubin: 0.3 mg/dL (ref 0.2–1.2)
Total Protein: 7.5 g/dL (ref 6.1–8.1)

## 2017-05-23 LAB — MICROALBUMIN, URINE: Microalb, Ur: 1.1 mg/dL

## 2017-05-23 LAB — CBC WITH DIFFERENTIAL/PLATELET
Basophils Absolute: 73 cells/uL (ref 0–200)
Basophils Relative: 1.1 %
Eosinophils Absolute: 152 cells/uL (ref 15–500)
Eosinophils Relative: 2.3 %
HCT: 46.4 % (ref 38.5–50.0)
Hemoglobin: 14.9 g/dL (ref 13.2–17.1)
Lymphs Abs: 3036 cells/uL (ref 850–3900)
MCH: 26.6 pg — ABNORMAL LOW (ref 27.0–33.0)
MCHC: 32.1 g/dL (ref 32.0–36.0)
MCV: 82.7 fL (ref 80.0–100.0)
MPV: 12 fL (ref 7.5–12.5)
Monocytes Relative: 10.3 %
Neutro Abs: 2660 cells/uL (ref 1500–7800)
Neutrophils Relative %: 40.3 %
Platelets: 263 10*3/uL (ref 140–400)
RBC: 5.61 10*6/uL (ref 4.20–5.80)
RDW: 13.5 % (ref 11.0–15.0)
Total Lymphocyte: 46 %
WBC mixed population: 680 cells/uL (ref 200–950)
WBC: 6.6 10*3/uL (ref 3.8–10.8)

## 2017-05-23 LAB — HEMOGLOBIN A1C
Hgb A1c MFr Bld: 6.3 % of total Hgb — ABNORMAL HIGH (ref <5.7)
Mean Plasma Glucose: 134 (calc)
eAG (mmol/L): 7.4 (calc)

## 2017-05-23 LAB — URIC ACID: Uric Acid, Serum: 5.3 mg/dL (ref 4.0–8.0)

## 2017-06-01 ENCOUNTER — Telehealth: Payer: Self-pay | Admitting: Family Medicine

## 2017-06-01 NOTE — Telephone Encounter (Signed)
Pt states that his knee is no better and wants to know what to do now?

## 2017-06-04 ENCOUNTER — Encounter: Payer: Self-pay | Admitting: Adult Health

## 2017-06-04 NOTE — Telephone Encounter (Signed)
Next step is cortisone shot or ortho referral

## 2017-06-05 ENCOUNTER — Encounter: Payer: Self-pay | Admitting: Adult Health

## 2017-06-05 ENCOUNTER — Ambulatory Visit (INDEPENDENT_AMBULATORY_CARE_PROVIDER_SITE_OTHER): Payer: Medicare Other | Admitting: Adult Health

## 2017-06-05 VITALS — BP 126/68 | HR 84 | Wt 303.6 lb

## 2017-06-05 DIAGNOSIS — G4733 Obstructive sleep apnea (adult) (pediatric): Secondary | ICD-10-CM | POA: Diagnosis not present

## 2017-06-05 NOTE — Patient Instructions (Signed)
Your Plan:  Continue using Bipap nightly. Try to >4 hours every night Take machine to DME company to have them look at filter.  Please call [employer] at [phone], and press option 1 when prompted. Their customer service representatives will be glad to assist you. If they are unable to answer, please leave a message and they will call you back. Make sure to leave your name and return phone number.    Thank you for coming to see [name] at [facility]. I hope we have been able to provide you high quality care today.  You may receive a patient satisfaction survey over the next few weeks. We would appreciate your feedback and comments so that we may continue to improve ourselves and the health of our patients.

## 2017-06-05 NOTE — Progress Notes (Addendum)
PATIENT: Kenneth [name] DOB: 09/16/1960  REASON FOR VISIT: follow up HISTORY FROM: patient  HISTORY OF PRESENT ILLNESS: Today 06/05/17 Mr. Candelas is a 57 year old male with a history of osa on BIpap. He returns today for   Compliance download.  His download indicates that he used his machine to use the machine 25 out of 30 days for compliance of 83%.  Machine greater than 4 hours 19 out of 30 days for compliance of 63%.  Overall his compliance is better than the previous visit.  On average he uses his machine 5 hours and 24 minutes.  He is on a pressure of 23/19 cm.  His residual AHI is 1.1.  His leak in the 95th percentile is 17.3 L/min.  The patient reports that he feels that his filter may be broken.  He has not taken his machine to his DME company.  He does note that he feels better when he uses the CPAP.  He also notes that he is currently suffering with gout.  He returns today for an evaluation.    HISTORY 12/05/2016: I reviewed his BiPAP compliance data from 11/05/2016 through 12/04/2016 which is a total of 30 days, during which time he used his BiPAP 21 days with percent used days greater than 4 hours at 40% only, indicating poor compliance with an average usage of 4 hours and 2 minutes for days on treatment, average AHI 1.3 per hour which is at goal, leak low with the 95th percentile at 1 L per minute, pressure of 29/19 cm.  I reviewed his BiPAP compliance data from 10/21/2016 through 11/19/2016, which is a total of 30 days, during which time he used his BiPAP only 21 days with percent used days greater than 4 hours at only 30%, indicating poor compliance, average usage of 3 hours and 40 minutes 4 days on treatment, average AHI at goal at 1.4 per hour, leak on the low side with the 95th percentile at 2.2 L/m on a pressure of 23/19 cm. In the past 90 days his compliance percentage was similar at 36%.  He reports L shoulder pain, better after steroid injection under PCP. Still bothered by  residual L knee pain too. intermittent congestion makes BiPAP use more difficult, but when he is able to sleep with it, he sleeps well and notices the difference. Trying to lose weight. Still smokes about 1/2 ppd.   The patient's allergies, current medications, family history, past medical history, past social history, past surgical history and problem list were reviewed and updated as appropriate.    REVIEW OF SYSTEMS: Out of a complete 14 system review of symptoms, the patient complains only of the following symptoms, and all other reviewed systems are negative.  Epworth sleepiness score is 6 patient has a slight limp due to gout.  ALLERGIES: No Known Allergies  HOME MEDICATIONS: Outpatient Medications Prior to Visit  Medication Sig Dispense Refill  . aspirin 81 MG tablet Take 81 mg by mouth daily.    Marland Kitchen atorvastatin (LIPITOR) 10 MG tablet Take 1 tablet (10 mg total) by mouth daily. 90 tablet 3  . Blood Glucose Monitoring Suppl (ONETOUCH VERIO) w/Device KIT     . fluticasone (FLONASE) 50 MCG/ACT nasal spray Place 1 spray into both nostrils daily. 16 g 2  . furosemide (LASIX) 20 MG tablet TAKE 1 TABLET BY MOUTH DAILY. 30 tablet 5  . lansoprazole (PREVACID) 30 MG capsule TAKE 1 CAPSULE BY MOUTH DAILY AT 12:00 NOON. [place]  capsule 1  . meloxicam (MOBIC) 15 MG tablet Take 1 tablet (15 mg total) by mouth daily. 30 tablet 0  . metFORMIN (GLUCOPHAGE) 500 MG tablet Take 1 tablet (500 mg total) by mouth 2 (two) times daily with a meal. 180 tablet 3  . nitroGLYCERIN (NITROSTAT) 0.4 MG SL tablet Place 1 tablet (0.4 mg total) under the tongue every 5 (five) minutes as needed. For chest pain 20 tablet 1  . omeprazole (PRILOSEC) 40 MG capsule Take 1 capsule (40 mg total) by mouth daily. 90 capsule 3  . predniSONE (DELTASONE) 20 MG tablet 3 tabs poqday 1-2, 2 tabs poqday 3-4, 1 tab poqday 5-6 12 tablet 0  . PROAIR HFA 108 (90 BASE) MCG/ACT inhaler INHALE 2 PUFFS INTO THE LUNGS EVERY 4 HOURS AS NEEDED FOR  SHORTNESS OF BREATH 8.5 g 2  . topiramate (TOPAMAX) 25 MG tablet TAKE 2 TABLETS BY MOUTH 2 TIMES DAILY. 120 tablet 1  . UNABLE TO FIND Lidocaine ointment 5%     Facility-Administered Medications Prior to Visit  Medication Dose Route Frequency Provider Last Rate Last Dose  . lactated ringers infusion    Continuous PRN Chyrel Masson, CRNA        PAST MEDICAL HISTORY: Past Medical History:  Diagnosis Date  . Arthritis   . Asthma   . Chronic bronchitis ([place])   . GERD (gastroesophageal reflux disease)   . Hypercholesteremia   . Hypertension   . OSA treated with BiPAP    ahi-132, bipap 23/19    PAST SURGICAL HISTORY: Past Surgical History:  Procedure Laterality Date  . ESOPHAGOGASTRODUODENOSCOPY (EGD) WITH PROPOFOL N/A 12/11/2012   Procedure: ESOPHAGOGASTRODUODENOSCOPY (EGD) WITH PROPOFOL;  Surgeon: Arta Silence, MD;  [facility];  Service: Endoscopy;  Laterality: N/A;  . FINGER AMPUTATION    . KNEE ARTHROSCOPY Left 11/15/2015   Procedure: ARTHROSCOPY KNEE AND MEDIAL MENISECTOMY;  Surgeon: Paralee Cancel, MD;  Location[facility];  Service: Orthopedics;  Laterality: Left;    FAMILY HISTORY: Family History  Problem Relation Age of Onset  . Hypertension Father   . Diabetes Father   . Diabetes Unknown        Multiple family members  . Diabetes Sister   . Diabetes Brother   . Stroke Sister     SOCIAL HISTORY: Social History   Socioeconomic History  . Marital status: Divorced    Spouse name: Not on file  . Number of children: 0  . Years of education: Not on file  . Highest education level: Not on file  Social Needs  . Financial resource strain: Not on file  . Food insecurity - worry: Not on file  . Food insecurity - inability: Not on file  . Transportation needs - medical: Not on file  . Transportation needs - non-medical: Not on file  Occupational History  . Not on file  Tobacco Use  . Smoking status: Current Every Day Smoker    Packs/day: 1.00     Years: 40.00    Pack years: 40.00    Types: Cigarettes  . Smokeless tobacco: Never Used  . Tobacco comment: 06/05/17 1 pack per 1 1/2 days  Substance and Sexual Activity  . Alcohol use: Yes    Alcohol/week: 0.0 oz    Comment: some  . Drug use: Yes    Types: Cocaine    Comment: patient states none- read lab report 09/02/2012/ 11-20-12 case cx.due to positive screen test-pt. advised no recreational drugs to be used  . Sexual  activity: Not on file  Other Topics Concern  . Not on file  Social History Narrative   Lives with brother and his sister in [place].        PHYSICAL EXAM  Vitals:   06/05/17 1003  BP: 126/68  Pulse: 84  Weight: (!) 303 lb 9.6 oz (137.7 kg)   Body mass index is 49 kg/m.  Generalized: Well developed, in no acute distress   Neurological examination  Mentation: Alert oriented to time, place, history taking. Follows all commands speech and language fluent Cranial nerve II-XII: Pupils were equal round reactive to light. Extraocular movements were full, visual field were full on confrontational test. Facial sensation and strength were normal. Uvula tongue midline. Head turning and shoulder shrug  were normal and symmetric. Motor: The motor testing reveals 5 over 5 strength of all 4 extremities. Good symmetric motor tone is noted throughout.  Sensory: Sensory testing is intact to soft touch on all 4 extremities. No evidence of extinction is noted.  Coordination: Cerebellar testing reveals good finger-nose-finger and heel-to-shin bilaterally.  Gait and station: Patient has a slight limp due to gout. Reflexes: Deep tendon reflexes are symmetric and normal bilaterally.   DIAGNOSTIC DATA (LABS, IMAGING, TESTING) - I reviewed patient records, labs, notes, testing and imaging myself where available.  Lab Results  Component Value Date   WBC 6.6 05/22/2017   HGB 14.9 05/22/2017   HCT 46.4 05/22/2017   MCV 82.7 05/22/2017   PLT 263 05/22/2017      Component Value  Date/Time   NA 141 05/22/2017 1433   K 4.7 05/22/2017 1433   CL 107 05/22/2017 1433   CO2 27 05/22/2017 1433   GLUCOSE 134 (H) 05/22/2017 1433   GLUCOSE 321 (H) 01/28/2016 1144   BUN 15 05/22/2017 1433   CREATININE 0.93 05/22/2017 1433   CALCIUM 9.3 05/22/2017 1433   PROT 7.5 05/22/2017 1433   ALBUMIN 4.0 08/25/2016 1039   AST 17 05/22/2017 1433   ALT 20 05/22/2017 1433   ALKPHOS 63 08/25/2016 1039   BILITOT 0.3 05/22/2017 1433   GFRNONAA 91 05/22/2017 1433   GFRAA 105 05/22/2017 1433   Lab Results  Component Value Date   CHOL 184 08/25/2016   HDL 42 08/25/2016   LDLCALC 116 (H) 08/25/2016   TRIG 130 08/25/2016   CHOLHDL 4.4 08/25/2016   Lab Results  Component Value Date   HGBA1C 6.3 (H) 05/22/2017      ASSESSMENT AND PLAN 57 y.o. year old male  has a past medical history of Arthritis, Asthma, Chronic bronchitis ([place]), GERD (gastroesophageal reflux disease), Hypercholesteremia, Hypertension, and OSA treated with BiPAP. here with :  1.  Obstructive sleep apnea on Bipap  Overall the patient is doing well.  His compliance has improved since the last visit.  He is encouraged to continue using the CPAP nightly.  He should try to use the machine greater than 4 hours every night.  Evaluate  An order was sent to his DME company to have them evaluate his filter.  The patient was also given the number to his DME company to call as well.  He is advised that if his symptoms worsen or he develops new symptoms he should let us know.  He will follow-up in 6 months or sooner if needed.   I spent 15 minutes with the patient. 50% of this time was spent reviewing his [place], MSN, NP-C 06/05/2017, 10:08 AM [facility] [address]  [place], [place] [address] [phone]  I reviewed the above note and documentation by the Nurse Practitioner and agree with the history, physical exam, assessment and plan as outlined above. I was  immediately available for face-to-face consultation. Star Age, MD, PhD [facility])

## 2017-06-05 NOTE — Progress Notes (Signed)
DME BiPAP order to check filter successfully faxed to [place].

## 2017-06-06 NOTE — Telephone Encounter (Signed)
Pt has apt 06/07/17 for shot

## 2017-06-07 ENCOUNTER — Ambulatory Visit (INDEPENDENT_AMBULATORY_CARE_PROVIDER_SITE_OTHER): Payer: Medicare Other | Admitting: Family Medicine

## 2017-06-07 ENCOUNTER — Encounter: Payer: Self-pay | Admitting: Family Medicine

## 2017-06-07 VITALS — BP 120/62 | HR 94 | Temp 98.1°F | Resp 18 | Ht 66.0 in | Wt 300.0 lb

## 2017-06-07 DIAGNOSIS — M25562 Pain in left knee: Secondary | ICD-10-CM | POA: Diagnosis not present

## 2017-06-07 DIAGNOSIS — R739 Hyperglycemia, unspecified: Secondary | ICD-10-CM

## 2017-06-07 MED ORDER — METFORMIN HCL 500 MG PO TABS: 500.0000 mg | ORAL_TABLET | Freq: Two times a day (BID) | ORAL | 3 refills | Status: DC

## 2017-06-07 NOTE — Progress Notes (Signed)
Subjective:    Patient ID: Kenneth [name], male    DOB: [birth year], 57 y.o.   MRN: [id number]  HPI  05/22/17 Patient has a history of gout.  He developed sudden onset of left knee pain a few days ago.  The knee is red hot and swollen.  It is tender to the touch.  He has pain with flexion and extension.  He denies any specific injury.  He denies any penetrating trauma to the knee.  He did not schedule the appointment for this reason however but he is also overdue for lab work to monitor his diabetes.  Unfortunately he is not fasting and therefore I cannot check his cholesterol.  AT that time, my plan was: Patient appears to have a gout exacerbation in his left knee.  Begin prednisone taper pack and reassess in 48 hours.  I have a low index of suspicion for septic arthritis but if worsening, consider septic arthritis.  While the patient is here, I will obtain lab work to monitor his diabetes including hemoglobin A1c as well as a urine microalbumin.  Goal hemoglobin A1c is less than 6.5.  06/07/17 The patient's knee pain is approximately 20% better after taking the prednisone.  Swelling is markedly better as is the erythema and warmth.  However the patient continues to complain of pain.  He had an x-ray of the left knee that showed mild degenerative changes in 2017.  Otherwise his workup of knee pain has been unremarkable in the past.  I still suspect gout versus osteoarthritis as a cause of his knee pain. Past Medical History:  Diagnosis Date  . Arthritis   . Asthma   . Chronic bronchitis ([place])   . GERD (gastroesophageal reflux disease)   . Hypercholesteremia   . Hypertension   . OSA treated with BiPAP    ahi-132, bipap 23/19   Past Surgical History:  Procedure Laterality Date  . ESOPHAGOGASTRODUODENOSCOPY (EGD) WITH PROPOFOL N/A 12/11/2012   Procedure: ESOPHAGOGASTRODUODENOSCOPY (EGD) WITH PROPOFOL;  Surgeon: Arta Silence, MD;  Location: [facility];  Service: Endoscopy;  Laterality: N/A;  .  FINGER AMPUTATION    . KNEE ARTHROSCOPY Left 11/15/2015   Procedure: ARTHROSCOPY KNEE AND MEDIAL MENISECTOMY;  Surgeon: Paralee Cancel, MD;  Location: [facility];  Service: Orthopedics;  Laterality: Left;   Current Outpatient Medications on File Prior to Visit  Medication Sig Dispense Refill  . aspirin 81 MG tablet Take 81 mg by mouth daily.    Marland Kitchen atorvastatin (LIPITOR) 10 MG tablet Take 1 tablet (10 mg total) by mouth daily. 90 tablet 3  . Blood Glucose Monitoring Suppl (ONETOUCH VERIO) w/Device KIT     . fluticasone (FLONASE) 50 MCG/ACT nasal spray Place 1 spray into both nostrils daily. 16 g 2  . furosemide (LASIX) 20 MG tablet TAKE 1 TABLET BY MOUTH DAILY. 30 tablet 5  . lansoprazole (PREVACID) 30 MG capsule TAKE 1 CAPSULE BY MOUTH DAILY AT 12:00 NOON. 90 capsule 1  . meloxicam (MOBIC) 15 MG tablet Take 1 tablet (15 mg total) by mouth daily. 30 tablet 0  . nitroGLYCERIN (NITROSTAT) 0.4 MG SL tablet Place 1 tablet (0.4 mg total) under the tongue every 5 (five) minutes as needed. For chest pain 20 tablet 1  . omeprazole (PRILOSEC) 40 MG capsule Take 1 capsule (40 mg total) by mouth daily. 90 capsule 3  . PROAIR HFA 108 (90 BASE) MCG/ACT inhaler INHALE 2 PUFFS INTO THE LUNGS EVERY 4 HOURS AS NEEDED FOR SHORTNESS OF BREATH  8.5 g 2  . topiramate (TOPAMAX) 25 MG tablet TAKE 2 TABLETS BY MOUTH 2 TIMES DAILY. 120 tablet 1  . UNABLE TO FIND Lidocaine ointment 5%     Current Facility-Administered Medications on File Prior to Visit  Medication Dose Route Frequency Provider Last Rate Last Dose  . lactated ringers infusion    Continuous PRN Chyrel Masson, CRNA       No Known Allergies Social History   Socioeconomic History  . Marital status: Divorced    Spouse name: Not on file  . Number of children: 0  . Years of education: Not on file  . Highest education level: Not on file  Social Needs  . Financial resource strain: Not on file  . Food insecurity - worry: Not on file  . Food insecurity -  inability: Not on file  . Transportation needs - medical: Not on file  . Transportation needs - non-medical: Not on file  Occupational History  . Not on file  Tobacco Use  . Smoking status: Current Every Day Smoker    Packs/day: 1.00    Years: 40.00    Pack years: 40.00    Types: Cigarettes  . Smokeless tobacco: Never Used  . Tobacco comment: 06/05/17 1 pack per 1 1/2 days  Substance and Sexual Activity  . Alcohol use: Yes    Alcohol/week: 0.0 oz    Comment: some  . Drug use: Yes    Types: Cocaine    Comment: patient states none- read lab report 09/02/2012/ 11-20-12 case cx.due to positive screen test-pt. advised no recreational drugs to be used  . Sexual activity: Not on file  Other Topics Concern  . Not on file  Social History Narrative   Lives with brother and his sister in [place].        Review of Systems  All other systems reviewed and are negative.      Objective:   Physical Exam  Cardiovascular: Normal rate, regular rhythm and normal heart sounds.  Pulmonary/Chest: Effort normal and breath sounds normal. No respiratory distress. He has no wheezes. He has no rales. He exhibits no tenderness.  Musculoskeletal:       Left knee: He exhibits decreased range of motion, swelling, effusion and erythema. Tenderness found. Medial joint line and lateral joint line tenderness noted.  Vitals reviewed.         Assessment & Plan:  Left knee pain, osteoarthritis versus gout  Using sterile technique, the left knee was injected with a mixture of 2 cc of lidocaine, 2 cc of Marcaine, and 2 cc of 40 mg/mL Kenalog.  Patient tolerated the procedure well without complication.

## 2017-06-19 DIAGNOSIS — G4733 Obstructive sleep apnea (adult) (pediatric): Secondary | ICD-10-CM | POA: Diagnosis not present

## 2017-07-09 ENCOUNTER — Telehealth: Payer: Self-pay | Admitting: Family Medicine

## 2017-07-09 MED ORDER — ACCU-CHEK AVIVA PLUS W/DEVICE KIT: PACK | 1 refills | Status: DC

## 2017-07-09 NOTE — Telephone Encounter (Signed)
accu chek aviva plus

## 2017-07-09 NOTE — Telephone Encounter (Signed)
Meter sent to pharm 

## 2017-07-09 NOTE — Telephone Encounter (Signed)
Has lost his diabetic meter, please call in new one to [facility].

## 2017-08-31 DIAGNOSIS — G4733 Obstructive sleep apnea (adult) (pediatric): Secondary | ICD-10-CM | POA: Diagnosis not present

## 2017-09-11 ENCOUNTER — Telehealth: Payer: Self-pay | Admitting: Family Medicine

## 2017-09-11 DIAGNOSIS — G4452 New daily persistent headache (NDPH): Secondary | ICD-10-CM

## 2017-09-11 MED ORDER — TOPIRAMATE 25 MG PO TABS: 50.0000 mg | ORAL_TABLET | Freq: Two times a day (BID) | ORAL | 1 refills | Status: DC

## 2017-09-11 NOTE — Telephone Encounter (Signed)
Prescription sent to pharmacy.

## 2017-09-11 NOTE — Telephone Encounter (Signed)
Patient needs refill on his topamax  Please send to [facility] on [address]

## 2017-09-17 DIAGNOSIS — G4733 Obstructive sleep apnea (adult) (pediatric): Secondary | ICD-10-CM | POA: Diagnosis not present

## 2017-10-02 ENCOUNTER — Other Ambulatory Visit: Payer: Self-pay | Admitting: Family Medicine

## 2017-10-23 DIAGNOSIS — G4733 Obstructive sleep apnea (adult) (pediatric): Secondary | ICD-10-CM | POA: Diagnosis not present

## 2017-10-24 DIAGNOSIS — H2513 Age-related nuclear cataract, bilateral: Secondary | ICD-10-CM | POA: Diagnosis not present

## 2017-10-24 DIAGNOSIS — E119 Type 2 diabetes mellitus without complications: Secondary | ICD-10-CM | POA: Diagnosis not present

## 2017-10-24 DIAGNOSIS — H35362 Drusen (degenerative) of macula, left eye: Secondary | ICD-10-CM | POA: Diagnosis not present

## 2017-10-24 DIAGNOSIS — H25013 Cortical age-related cataract, bilateral: Secondary | ICD-10-CM | POA: Diagnosis not present

## 2017-10-24 DIAGNOSIS — H35033 Hypertensive retinopathy, bilateral: Secondary | ICD-10-CM | POA: Diagnosis not present

## 2017-10-24 LAB — HM DIABETES EYE EXAM

## 2017-10-25 ENCOUNTER — Encounter: Payer: Self-pay | Admitting: *Deleted

## 2017-10-26 ENCOUNTER — Encounter: Payer: Self-pay | Admitting: *Deleted

## 2017-11-06 ENCOUNTER — Encounter: Payer: Self-pay | Admitting: Family Medicine

## 2017-11-06 ENCOUNTER — Ambulatory Visit (INDEPENDENT_AMBULATORY_CARE_PROVIDER_SITE_OTHER): Payer: Medicare Other | Admitting: Family Medicine

## 2017-11-06 VITALS — BP 130/70 | HR 80 | Temp 97.9°F | Resp 20 | Ht 66.0 in | Wt 296.0 lb

## 2017-11-06 DIAGNOSIS — M25562 Pain in left knee: Secondary | ICD-10-CM | POA: Diagnosis not present

## 2017-11-06 NOTE — Progress Notes (Signed)
Subjective:    Patient ID: Kenneth [name], male    DOB: 1960/09/19, 57 y.o.   MRN: [id number]  HPI  05/22/17 Patient has a history of gout.  He developed sudden onset of left knee pain a few days ago.  The knee is red hot and swollen.  It is tender to the touch.  He has pain with flexion and extension.  He denies any specific injury.  He denies any penetrating trauma to the knee.  He did not schedule the appointment for this reason however but he is also overdue for lab work to monitor his diabetes.  Unfortunately he is not fasting and therefore I cannot check his cholesterol.  AT that time, my plan was: Patient appears to have a gout exacerbation in his left knee.  Begin prednisone taper pack and reassess in 48 hours.  I have a low index of suspicion for septic arthritis but if worsening, consider septic arthritis.  While the patient is here, I will obtain lab work to monitor his diabetes including hemoglobin A1c as well as a urine microalbumin.  Goal hemoglobin A1c is less than 6.5.  06/07/17 The patient's knee pain is approximately 20% better after taking the prednisone.  Swelling is markedly better as is the erythema and warmth.  However the patient continues to complain of pain.  He had an x-ray of the left knee that showed mild degenerative changes in 2017.  Otherwise his workup of knee pain has been unremarkable in the past.  I still suspect gout versus osteoarthritis as a cause of his knee pain.  At that time, my plan was: Using sterile technique, the left knee was injected with a mixture of 2 cc of lidocaine, 2 cc of Marcaine, and 2 cc of 40 mg/mL Kenalog.  Patient tolerated the procedure well without complication.  11/06/17 Patient was worked in today urgently complaining of left knee pain.  He states that after I gave the patient a cortisone shot, his pain stayed away for almost 5 months.  However over the last 2 weeks the pain is gradually returned.  On exam today there is a small effusion in  the knee.  There is tenderness to palpation over the medial and lateral joint line.  He has pain with range of motion.  There is no erythema or warmth. Past Medical History:  Diagnosis Date  . Arthritis   . Asthma   . Chronic bronchitis ([place])   . GERD (gastroesophageal reflux disease)   . Hypercholesteremia   . Hypertension   . OSA treated with BiPAP    ahi-132, bipap 23/19   Past Surgical History:  Procedure Laterality Date  . ESOPHAGOGASTRODUODENOSCOPY (EGD) WITH PROPOFOL N/A 12/11/2012   Procedure: ESOPHAGOGASTRODUODENOSCOPY (EGD) WITH PROPOFOL;  Surgeon: Arta Silence, MD;  [facility];  Service: Endoscopy;  Laterality: N/A;  . FINGER AMPUTATION    . KNEE ARTHROSCOPY Left 11/15/2015   Procedure: ARTHROSCOPY KNEE AND MEDIAL MENISECTOMY;  Surgeon: Paralee Cancel, MD;  [facility];  Service: Orthopedics;  Laterality: Left;   Current Outpatient Medications on File Prior to Visit  Medication Sig Dispense Refill  . aspirin 81 MG tablet Take 81 mg by mouth daily.    Marland Kitchen atorvastatin (LIPITOR) 10 MG tablet Take 1 tablet (10 mg total) by mouth daily. 90 tablet 3  . Blood Glucose Monitoring Suppl (ACCU-CHEK AVIVA PLUS) w/Device KIT Check BS bid 1 kit 1  . Blood Glucose Monitoring Suppl (ONETOUCH VERIO) w/Device KIT     .  fluticasone (FLONASE) 50 MCG/ACT nasal spray Place 1 spray into both nostrils daily. 16 g 2  . furosemide (LASIX) 20 MG tablet TAKE 1 TABLET BY MOUTH DAILY. 30 tablet 5  . lansoprazole (PREVACID) 30 MG capsule TAKE 1 CAPSULE BY MOUTH DAILY AT 12:00 NOON. 90 capsule 1  . meloxicam (MOBIC) 15 MG tablet Take 1 tablet (15 mg total) by mouth daily. 30 tablet 0  . meloxicam (MOBIC) 15 MG tablet TAKE 1 TABLET BY MOUTH DAILY. 30 tablet 4  . metFORMIN (GLUCOPHAGE) 500 MG tablet Take 1 tablet (500 mg total) by mouth 2 (two) times daily with a meal. 180 tablet 3  . nitroGLYCERIN (NITROSTAT) 0.4 MG SL tablet Place 1 tablet (0.4 mg total) under the tongue every 5 (five)  minutes as needed. For chest pain 20 tablet 1  . omeprazole (PRILOSEC) 40 MG capsule Take 1 capsule (40 mg total) by mouth daily. 90 capsule 3  . PROAIR HFA 108 (90 BASE) MCG/ACT inhaler INHALE 2 PUFFS INTO THE LUNGS EVERY 4 HOURS AS NEEDED FOR SHORTNESS OF BREATH 8.5 g 2  . topiramate (TOPAMAX) 25 MG tablet Take 2 tablets (50 mg total) by mouth 2 (two) times daily. 120 tablet 1  . UNABLE TO FIND Lidocaine ointment 5%     Current Facility-Administered Medications on File Prior to Visit  Medication Dose Route Frequency Provider Last Rate Last Dose  . lactated ringers infusion    Continuous PRN Chyrel Masson, CRNA       No Known Allergies Social History   Socioeconomic History  . Marital status: Divorced    Spouse name: Not on file  . Number of children: 0  . Years of education: Not on file  . Highest education level: Not on file  Occupational History  . Not on file  Social Needs  . Financial resource strain: Not on file  . Food insecurity:    Worry: Not on file    Inability: Not on file  . Transportation needs:    Medical: Not on file    Non-medical: Not on file  Tobacco Use  . Smoking status: Current Every Day Smoker    Packs/day: 1.00    Years: 40.00    Pack years: 40.00    Types: Cigarettes  . Smokeless tobacco: Never Used  . Tobacco comment: 06/05/17 1 pack per 1 1/2 days  Substance and Sexual Activity  . Alcohol use: Yes    Alcohol/week: 0.0 standard drinks    Comment: some  . Drug use: Yes    Types: Cocaine    Comment: patient states none- read lab report 09/02/2012/ 11-20-12 case cx.due to positive screen test-pt. advised no recreational drugs to be used  . Sexual activity: Not on file  Lifestyle  . Physical activity:    Days per week: Not on file    Minutes per session: Not on file  . Stress: Not on file  Relationships  . Social connections:    Talks on phone: Not on file    Gets together: Not on file    Attends religious service: Not on file     Active member of club or organization: Not on file    Attends meetings of clubs or organizations: Not on file    Relationship status: Not on file  . Intimate partner violence:    Fear of current or ex partner: Not on file    Emotionally abused: Not on file    Physically abused: Not on file  Forced sexual activity: Not on file  Other Topics Concern  . Not on file  Social History Narrative   Lives with brother and his sister in [place].        Review of Systems  All other systems reviewed and are negative.      Objective:   Physical Exam  Cardiovascular: Normal rate, regular rhythm and normal heart sounds.  Pulmonary/Chest: Effort normal and breath sounds normal. No respiratory distress. He has no wheezes. He has no rales. He exhibits no tenderness.  Musculoskeletal:       Left knee: He exhibits decreased range of motion, swelling, effusion and erythema. Tenderness found. Medial joint line and lateral joint line tenderness noted.  Vitals reviewed.         Assessment & Plan:  Acute pain of left knee  I suspect underlying osteoarthritis.  We discussed options.  Patient can get a cortisone injection however I explained that this is not curing the problem but only "kicking the can further down the road".  Another option will be to see orthopedic surgery to discuss possible arthroscopy.  Patient would like to get a cortisone injection today.  He defers seeing a orthopedist at the present time as long as the shot provides some relief.  Using sterile technique, the left knee was injected with a mixture of 2 cc of lidocaine, 2 cc of Marcaine, and 2 cc of 40 mg/mL Kenalog.  Patient tolerated the procedure well without complication.

## 2017-12-10 ENCOUNTER — Other Ambulatory Visit: Payer: Self-pay | Admitting: Family Medicine

## 2017-12-10 ENCOUNTER — Encounter: Payer: Self-pay | Admitting: Adult Health

## 2017-12-10 DIAGNOSIS — G4452 New daily persistent headache (NDPH): Secondary | ICD-10-CM

## 2017-12-12 ENCOUNTER — Ambulatory Visit (INDEPENDENT_AMBULATORY_CARE_PROVIDER_SITE_OTHER): Payer: Medicare Other | Admitting: Adult Health

## 2017-12-12 ENCOUNTER — Encounter: Payer: Self-pay | Admitting: Adult Health

## 2017-12-12 ENCOUNTER — Telehealth: Payer: Self-pay | Admitting: *Deleted

## 2017-12-12 VITALS — BP 117/74 | HR 79 | Ht 66.0 in | Wt 296.2 lb

## 2017-12-12 DIAGNOSIS — G4733 Obstructive sleep apnea (adult) (pediatric): Secondary | ICD-10-CM | POA: Diagnosis not present

## 2017-12-12 NOTE — Progress Notes (Signed)
Community message sent via Epic, re: new BiPAP order.

## 2017-12-12 NOTE — Patient Instructions (Signed)
Your Plan:  Try using CPAP nightly and >4 hours each night If your symptoms worsen or you develop new symptoms please let us know.   Thank you for coming to see [name] at [facility]. I hope we have been able to provide you high quality care today.  You may receive a patient satisfaction survey over the next few weeks. We would appreciate your feedback and comments so that we may continue to improve ourselves and the health of our patients.

## 2017-12-12 NOTE — Telephone Encounter (Signed)
Per Epic message from [employer], patient is not eligible for new CPAP machine at this time. He received a new machine in 2016. Called patient and LVM advising him of his.  Advised him  [facility] will make pressure adjustment as ordered by NP. Left office number for any questions.

## 2017-12-12 NOTE — Progress Notes (Addendum)
PATIENT: Kenneth [name] DOB: 10-Nov-1960  REASON FOR VISIT: follow up HISTORY FROM: patient  HISTORY OF PRESENT ILLNESS: Today 12/12/17:  Kenneth [name] is a 57 year old male with a history of he returns today for follow-up.  His Bipap machine 54 out of 90 days for compliance of 60%.  He uses machine greater than 4 hours 39 days for compliance of 43%.  On average he uses his machine 5 hours and 10 minutes.  He is on 23/19 centimeters of water.  His residual AHI is 0.9.  His leak in the 90th percentile is 4.4.  He states that he does not always sleep well at night.  Sometimes he has trouble falling asleep and staying asleep therefore he sometimes does not put on the CPAP or he takes it off.  He returns today for evaluation.  HISTORY 06/05/17 Kenneth [name] is a 57 year old male with a history of osa on BIpap. He returns today for   Compliance download.  His download indicates that he used his machine to use the machine 25 out of 30 days for compliance of 83%.  Machine greater than 4 hours 19 out of 30 days for compliance of 63%.  Overall his compliance is better than the previous visit.  On average he uses his machine 5 hours and 24 minutes.  He is on a pressure of 23/19 cm.  His residual AHI is 1.1.  His leak in the 95th percentile is 17.3 L/min.  The patient reports that he feels that his filter may be broken.  He has not taken his machine to his DME company.  He does note that he feels better when he uses the CPAP.  He also notes that he is currently suffering with gout.  He returns today for an evaluation.  REVIEW OF SYSTEMS: Out of a complete 14 system review of symptoms, the patient complains only of the following symptoms, and all other reviewed systems are negative.  See HPI  ALLERGIES: No Known Allergies  HOME MEDICATIONS: Outpatient Medications Prior to Visit  Medication Sig Dispense Refill  . aspirin 81 MG tablet Take 81 mg by mouth daily.    Marland Kitchen atorvastatin (LIPITOR) 10 MG tablet TAKE 1  TABLET BY MOUTH DAILY. 90 tablet 3  . Blood Glucose Monitoring Suppl (ACCU-CHEK AVIVA PLUS) w/Device KIT Check BS bid 1 kit 1  . Blood Glucose Monitoring Suppl (ONETOUCH VERIO) w/Device KIT     . fluticasone (FLONASE) 50 MCG/ACT nasal spray Place 1 spray into both nostrils daily. 16 g 2  . furosemide (LASIX) 20 MG tablet TAKE 1 TABLET BY MOUTH DAILY. 30 tablet 5  . lansoprazole (PREVACID) 30 MG capsule TAKE 1 CAPSULE BY MOUTH DAILY AT 12:00 NOON. 90 capsule 1  . meloxicam (MOBIC) 15 MG tablet Take 1 tablet (15 mg total) by mouth daily. 30 tablet 0  . metFORMIN (GLUCOPHAGE) 500 MG tablet Take 1 tablet (500 mg total) by mouth 2 (two) times daily with a meal. 180 tablet 3  . nitroGLYCERIN (NITROSTAT) 0.4 MG SL tablet Place 1 tablet (0.4 mg total) under the tongue every 5 (five) minutes as needed. For chest pain 20 tablet 1  . omeprazole (PRILOSEC) 40 MG capsule Take 1 capsule (40 mg total) by mouth daily. 90 capsule 3  . PROAIR HFA 108 (90 BASE) MCG/ACT inhaler INHALE 2 PUFFS INTO THE LUNGS EVERY 4 HOURS AS NEEDED FOR SHORTNESS OF BREATH 8.5 g 2  . topiramate (TOPAMAX) 25 MG tablet TAKE 2  TABLETS BY MOUTH 2 TIMES DAILY. 120 tablet 1  . UNABLE TO FIND Lidocaine ointment 5%    . meloxicam (MOBIC) 15 MG tablet TAKE 1 TABLET BY MOUTH DAILY. 30 tablet 4   Facility-Administered Medications Prior to Visit  Medication Dose Route Frequency Provider Last Rate Last Dose  . lactated ringers infusion    Continuous PRN Chyrel Masson, CRNA        PAST MEDICAL HISTORY: Past Medical History:  Diagnosis Date  . Arthritis   . Asthma   . Chronic bronchitis ([place])   . GERD (gastroesophageal reflux disease)   . Hypercholesteremia   . Hypertension   . OSA treated with BiPAP    ahi-132, bipap 23/19    PAST SURGICAL HISTORY: Past Surgical History:  Procedure Laterality Date  . ESOPHAGOGASTRODUODENOSCOPY (EGD) WITH PROPOFOL N/A 12/11/2012   Procedure: ESOPHAGOGASTRODUODENOSCOPY (EGD) WITH PROPOFOL;   Surgeon: Arta Silence, MD;  [facility];  Service: Endoscopy;  Laterality: N/A;  . FINGER AMPUTATION    . KNEE ARTHROSCOPY Left 11/15/2015   Procedure: ARTHROSCOPY KNEE AND MEDIAL MENISECTOMY;  Surgeon: Paralee Cancel, MD;  Location: [facility];  Service: Orthopedics;  Laterality: Left;    FAMILY HISTORY: Family History  Problem Relation Age of Onset  . Hypertension Father   . Diabetes Father   . Diabetes Unknown        Multiple family members  . Diabetes Sister   . Diabetes Brother   . Stroke Sister     SOCIAL HISTORY: Social History   Socioeconomic History  . Marital status: Divorced    Spouse name: Not on file  . Number of children: 0  . Years of education: Not on file  . Highest education level: Not on file  Occupational History  . Not on file  Social Needs  . Financial resource strain: Not on file  . Food insecurity:    Worry: Not on file    Inability: Not on file  . Transportation needs:    Medical: Not on file    Non-medical: Not on file  Tobacco Use  . Smoking status: Current Every Day Smoker    Packs/day: 1.00    Years: 40.00    Pack years: 40.00    Types: Cigarettes  . Smokeless tobacco: Never Used  . Tobacco comment: 06/05/17 1 pack per 1 1/2 days  Substance and Sexual Activity  . Alcohol use: Yes    Alcohol/week: 0.0 standard drinks    Comment: some  . Drug use: Yes    Types: Cocaine    Comment: patient states none- read lab report 09/02/2012/ 11-20-12 case cx.due to positive screen test-pt. advised no recreational drugs to be used  . Sexual activity: Not on file  Lifestyle  . Physical activity:    Days per week: Not on file    Minutes per session: Not on file  . Stress: Not on file  Relationships  . Social connections:    Talks on phone: Not on file    Gets together: Not on file    Attends religious service: Not on file    Active member of club or organization: Not on file    Attends meetings of clubs or organizations: Not on file     Relationship status: Not on file  . Intimate partner violence:    Fear of current or ex partner: Not on file    Emotionally abused: Not on file    Physically abused: Not on file    Forced  sexual activity: Not on file  Other Topics Concern  . Not on file  Social History Narrative   Lives with brother and his sister in [place].        PHYSICAL EXAM  Vitals:   12/12/17 1015  BP: 117/74  Pulse: 79  Weight: 296 lb 3.2 oz (134.4 kg)  Height: '5\' 6"'$  (1.676 m)   Body mass index is 47.81 kg/m.  Generalized: Well developed, in no acute distress   Neurological examination  Mentation: Alert oriented to time, place, history taking. Follows all commands speech and language fluent Cranial nerve II-XII: Pupils were equal round reactive to light. Extraocular movements were full, visual field were full on confrontational test. Facial sensation and strength were normal. Uvula tongue midline. Head turning and shoulder shrug  were normal and symmetric.  Neck circumference 19 inches, Mallampati 4 Motor: The motor testing reveals 5 over 5 strength of all 4 extremities. Good symmetric motor tone is noted throughout.  Sensory: Sensory testing is intact to soft touch on all 4 extremities. No evidence of extinction is noted.  Coordination: Cerebellar testing reveals good finger-nose-finger and heel-to-shin bilaterally.  Gait and station: Gait is normal.  Reflexes: Deep tendon reflexes are symmetric and normal bilaterally.   DIAGNOSTIC DATA (LABS, IMAGING, TESTING) - I reviewed patient records, labs, notes, testing and imaging myself where available.  Lab Results  Component Value Date   WBC 6.6 05/22/2017   HGB 14.9 05/22/2017   HCT 46.4 05/22/2017   MCV 82.7 05/22/2017   PLT 263 05/22/2017      Component Value Date/Time   NA 141 05/22/2017 1433   K 4.7 05/22/2017 1433   CL 107 05/22/2017 1433   CO2 27 05/22/2017 1433   GLUCOSE 134 (H) 05/22/2017 1433   GLUCOSE 321 (H) 01/28/2016 1144   BUN 15  05/22/2017 1433   CREATININE 0.93 05/22/2017 1433   CALCIUM 9.3 05/22/2017 1433   PROT 7.5 05/22/2017 1433   ALBUMIN 4.0 08/25/2016 1039   AST 17 05/22/2017 1433   ALT 20 05/22/2017 1433   ALKPHOS 63 08/25/2016 1039   BILITOT 0.3 05/22/2017 1433   GFRNONAA 91 05/22/2017 1433   GFRAA 105 05/22/2017 1433   Lab Results  Component Value Date   CHOL 184 08/25/2016   HDL 42 08/25/2016   LDLCALC 116 (H) 08/25/2016   TRIG 130 08/25/2016   CHOLHDL 4.4 08/25/2016   Lab Results  Component Value Date   HGBA1C 6.3 (H) 05/22/2017      ASSESSMENT AND PLAN 57 y.o. year old male  has a past medical history of Arthritis, Asthma, Chronic bronchitis ([place]), GERD (gastroesophageal reflux disease), Hypercholesteremia, Hypertension, and OSA treated with BiPAP. here with :  1.  Obstructive sleep apnea BiPAP  The patient's download suboptimal compliance.  I discussed the risks associated with untreated sleep apnea.  Patient voiced understanding.  I encouraged him to use his CPAP nightly and greater than 4 hours each night.  There was no change in his pressure today.  He was advised that if his symptoms worsen or he develops new symptoms he should let us know.  We will follow-up in 6 months or sooner if needed.    Ward Givens, MSN, NP-C 12/12/2017, 10:45 AM [facility] [address] [place], [place] [address] [phone]  I reviewed the above note and documentation by the Nurse Practitioner and agree with the history, physical exam, assessment and plan as outlined above. I was immediately available for face-to-face  consultation. Star Age, MD, PhD [facility])

## 2018-01-08 DIAGNOSIS — G4733 Obstructive sleep apnea (adult) (pediatric): Secondary | ICD-10-CM | POA: Diagnosis not present

## 2018-01-10 ENCOUNTER — Ambulatory Visit: Payer: Medicare Other | Admitting: Family Medicine

## 2018-01-25 ENCOUNTER — Other Ambulatory Visit: Payer: Self-pay

## 2018-01-25 NOTE — Patient Outreach (Signed)
[facility]) Care Management  01/25/2018  Kenneth [name] 1960/05/09 [id number]   Medication Adherence call to [name] left a message for patient to call back patient is due on Metformin 500 mg under [place].   [facility] Management Direct Dial [phone]  Fax [phone] [email or web address]

## 2018-02-03 ENCOUNTER — Emergency Department (HOSPITAL_COMMUNITY)
Admission: EM | Admit: 2018-02-03 | Discharge: 2018-02-03 | Disposition: A | Discharge status: Home / Self Care | Payer: Medicare Other | Attending: Emergency Medicine | Admitting: Emergency Medicine

## 2018-02-03 ENCOUNTER — Emergency Department (HOSPITAL_COMMUNITY): Payer: Medicare Other

## 2018-02-03 ENCOUNTER — Encounter (HOSPITAL_COMMUNITY): Payer: Self-pay | Admitting: Emergency Medicine

## 2018-02-03 ENCOUNTER — Ambulatory Visit (HOSPITAL_COMMUNITY)
Admission: EM | Admit: 2018-02-03 | Discharge: 2018-02-03 | Disposition: A | Discharge status: Other Acute Inpatient Hospital | Payer: Medicare Other | Source: Home / Self Care

## 2018-02-03 DIAGNOSIS — Z7982 Long term (current) use of aspirin: Secondary | ICD-10-CM | POA: Diagnosis not present

## 2018-02-03 DIAGNOSIS — R519 Headache, unspecified: Secondary | ICD-10-CM

## 2018-02-03 DIAGNOSIS — I10 Essential (primary) hypertension: Secondary | ICD-10-CM | POA: Diagnosis not present

## 2018-02-03 DIAGNOSIS — R51 Headache: Secondary | ICD-10-CM | POA: Insufficient documentation

## 2018-02-03 DIAGNOSIS — F17210 Nicotine dependence, cigarettes, uncomplicated: Secondary | ICD-10-CM | POA: Diagnosis not present

## 2018-02-03 DIAGNOSIS — Z79899 Other long term (current) drug therapy: Secondary | ICD-10-CM | POA: Diagnosis not present

## 2018-02-03 LAB — I-STAT CHEM 8, ED
BUN: 19 mg/dL (ref 6–20)
Calcium, Ion: 1.19 mmol/L (ref 1.15–1.40)
Chloride: 109 mmol/L (ref 98–111)
Creatinine, Ser: 0.8 mg/dL (ref 0.61–1.24)
Glucose, Bld: 120 mg/dL — ABNORMAL HIGH (ref 70–99)
HCT: 48 % (ref 39.0–52.0)
Hemoglobin: 16.3 g/dL (ref 13.0–17.0)
Potassium: 3.8 mmol/L (ref 3.5–5.1)
Sodium: 141 mmol/L (ref 135–145)
TCO2: 24 mmol/L (ref 22–32)

## 2018-02-03 LAB — CBC WITH DIFFERENTIAL/PLATELET
Abs Immature Granulocytes: 0.03 10*3/uL (ref 0.00–0.07)
Basophils Absolute: 0.1 10*3/uL (ref 0.0–0.1)
Basophils Relative: 1 %
Eosinophils Absolute: 0.1 10*3/uL (ref 0.0–0.5)
Eosinophils Relative: 2 %
HCT: 46.6 % (ref 39.0–52.0)
Hemoglobin: 14.7 g/dL (ref 13.0–17.0)
Immature Granulocytes: 0 %
Lymphocytes Relative: 38 %
Lymphs Abs: 2.8 10*3/uL (ref 0.7–4.0)
MCH: 26.6 pg (ref 26.0–34.0)
MCHC: 31.5 g/dL (ref 30.0–36.0)
MCV: 84.4 fL (ref 80.0–100.0)
Monocytes Absolute: 0.6 10*3/uL (ref 0.1–1.0)
Monocytes Relative: 8 %
Neutro Abs: 3.9 10*3/uL (ref 1.7–7.7)
Neutrophils Relative %: 51 %
Platelets: 241 10*3/uL (ref 150–400)
RBC: 5.52 MIL/uL (ref 4.22–5.81)
RDW: 17.7 % — ABNORMAL HIGH (ref 11.5–15.5)
WBC: 7.5 10*3/uL (ref 4.0–10.5)
nRBC: 0 % (ref 0.0–0.2)

## 2018-02-03 LAB — CBG MONITORING, ED: Glucose-Capillary: 143 mg/dL — ABNORMAL HIGH (ref 70–99)

## 2018-02-03 MED ORDER — KETOROLAC TROMETHAMINE 30 MG/ML IJ SOLN
30.0000 mg | Freq: Once | INTRAMUSCULAR | Status: AC
  Administered 2018-02-03: 30 mg via INTRAVENOUS
  Filled 2018-02-03: qty 1

## 2018-02-03 MED ORDER — METOCLOPRAMIDE HCL 5 MG/ML IJ SOLN
10.0000 mg | Freq: Once | INTRAMUSCULAR | Status: AC
  Administered 2018-02-03: 10 mg via INTRAVENOUS
  Filled 2018-02-03: qty 2

## 2018-02-03 NOTE — ED Triage Notes (Signed)
Pt arrives with reports of his blood sugar dropping and a HA. Report his CBG was 94 then 101 after some peanut butter. Reports of HA for seven days. States he taken metformin and normally doesn't check his blood sugar.

## 2018-02-03 NOTE — ED Notes (Signed)
Pt verbalized understanding of discharge paperwork.

## 2018-02-03 NOTE — ED Provider Notes (Signed)
[facility] EMERGENCY DEPARTMENT Provider Note   CSN: [id number] Arrival date & time: 02/03/18  1108     History   Chief Complaint Chief Complaint  Patient presents with  . Hypoglycemia  . Headache    HPI Kenneth [name] is a 57 y.o. male.  Patient is a 57 year old male who presents with complaints of headache, and is concerned about his blood sugars.  He states that they have been running low.  He checked it this morning and it was 94.  He then ate and sugar improved somewhat.  He denies any vomiting or diarrhea.  He denies any visual disturbances or weakness.  The history is provided by the patient.  Hypoglycemia  Initial blood sugar:  94 Blood sugar after intervention:  140 Timing:  Constant Progression:  Unchanged Chronicity:  New Relieved by:  Nothing Ineffective treatments:  None tried Associated symptoms: no altered mental status   Headache      Past Medical History:  Diagnosis Date  . Arthritis   . Asthma   . Chronic bronchitis ([place])   . GERD (gastroesophageal reflux disease)   . Hypercholesteremia   . Hypertension   . OSA treated with BiPAP    ahi-132, bipap 23/19    Patient Active Problem List   Diagnosis Date Noted  . OSA treated with BiPAP   . Syncope 05/13/2014  . Asthma   . Knee pain   . Chest pain 10/23/2011  . Dysphagia 10/23/2011  . Hyperlipidemia 10/23/2011  . Obesity 10/23/2011    Past Surgical History:  Procedure Laterality Date  . ESOPHAGOGASTRODUODENOSCOPY (EGD) WITH PROPOFOL N/A 12/11/2012   Procedure: ESOPHAGOGASTRODUODENOSCOPY (EGD) WITH PROPOFOL;  Surgeon: Arta Silence, MD;  Location: [facility] ENDOSCOPY;  Service: Endoscopy;  Laterality: N/A;  . FINGER AMPUTATION    . KNEE ARTHROSCOPY Left 11/15/2015   Procedure: ARTHROSCOPY KNEE AND MEDIAL MENISECTOMY;  Surgeon: Paralee Cancel, MD;  Location: [facility] ORS;  Service: Orthopedics;  Laterality: Left;        Home Medications    Prior to Admission medications     Medication Sig Start Date End Date Taking? Authorizing Provider  aspirin 81 MG tablet Take 81 mg by mouth daily.    [provider]  atorvastatin (LIPITOR) 10 MG tablet TAKE 1 TABLET BY MOUTH DAILY. 12/10/17   [name], MD  Blood Glucose Monitoring Suppl (ACCU-CHEK AVIVA PLUS) w/Device KIT Check BS bid 07/09/17   [name], MD  Blood Glucose Monitoring Suppl (ONETOUCH VERIO) w/Device KIT  01/28/16   [provider]  fluticasone (FLONASE) 50 MCG/ACT nasal spray Place 1 spray into both nostrils daily. 05/04/17   Zigmund Gottron, NP  furosemide (LASIX) 20 MG tablet TAKE 1 TABLET BY MOUTH DAILY. 02/15/16   [name], MD  lansoprazole (PREVACID) 30 MG capsule TAKE 1 CAPSULE BY MOUTH DAILY AT 12:00 NOON. 07/16/14   Orlena Sheldon, PA-C  meloxicam (MOBIC) 15 MG tablet Take 1 tablet (15 mg total) by mouth daily. 05/04/17   Zigmund Gottron, NP  metFORMIN (GLUCOPHAGE) 500 MG tablet Take 1 tablet (500 mg total) by mouth 2 (two) times daily with a meal. 06/07/17   [name], MD  nitroGLYCERIN (NITROSTAT) 0.4 MG SL tablet Place 1 tablet (0.4 mg total) under the tongue every 5 (five) minutes as needed. For chest pain 03/01/15   Alycia Rossetti, MD  omeprazole (PRILOSEC) 40 MG capsule Take 1 capsule (40 mg total) by mouth daily. 12/14/16  [name], MD  PROAIR HFA 108 [phone] BASE) MCG/ACT inhaler INHALE 2 PUFFS INTO THE LUNGS EVERY 4 HOURS AS NEEDED FOR SHORTNESS OF BREATH 03/12/14   Dena Billet B, PA-C  topiramate (TOPAMAX) 25 MG tablet TAKE 2 TABLETS BY MOUTH 2 TIMES DAILY. 12/10/17   [name], MD  UNABLE TO FIND Lidocaine ointment 5%    [provider]    Family History Family History  Problem Relation Age of Onset  . Hypertension Father   . Diabetes Father   . Diabetes Unknown        Multiple family members  . Diabetes Sister   . Diabetes Brother   . Stroke Sister     Social History Social History   Tobacco Use  . Smoking  status: Current Every Day Smoker    Packs/day: 1.00    Years: 40.00    Pack years: 40.00    Types: Cigarettes  . Smokeless tobacco: Never Used  . Tobacco comment: 06/05/17 1 pack per 1 1/2 days  Substance Use Topics  . Alcohol use: Yes    Alcohol/week: 0.0 standard drinks    Comment: some  . Drug use: Yes    Types: Cocaine    Comment: patient states none- read lab report 09/02/2012/ 11-20-12 case cx.due to positive screen test-pt. advised no recreational drugs to be used     Allergies   Patient has no known allergies.   Review of Systems Review of Systems  Neurological: Positive for headaches.  All other systems reviewed and are negative.    Physical Exam Updated Vital Signs BP (!) 113/54   Pulse 84   Temp 98.2 F (36.8 C) (Oral)   Resp (!) 22   Ht _0  (1.676 m)   Wt 136.1 kg   SpO2 96%   BMI 48.42 kg/m   Physical Exam  Constitutional: He is oriented to person, place, and time. He appears well-developed and well-nourished. No distress.  HENT:  Head: Normocephalic and atraumatic.  Mouth/Throat: Oropharynx is clear and moist.  Neck: Normal range of motion. Neck supple.  Cardiovascular: Normal rate and regular rhythm. Exam reveals no friction rub.  No murmur heard. Pulmonary/Chest: Effort normal and breath sounds normal. No respiratory distress. He has no wheezes. He has no rales.  Abdominal: Soft. Bowel sounds are normal. He exhibits no distension. There is no tenderness.  Musculoskeletal: Normal range of motion. He exhibits no edema.  Neurological: He is alert and oriented to person, place, and time. No cranial nerve deficit. Coordination normal.  Skin: Skin is warm and dry. He is not diaphoretic.  Nursing note and vitals reviewed.    ED Treatments / Results  Labs (all labs ordered are listed, but only abnormal results are displayed) Labs Reviewed  CBC WITH DIFFERENTIAL/PLATELET - Abnormal; Notable for the following components:      Result Value   RDW  17.7 (*)    All other components within normal limits  CBG MONITORING, ED - Abnormal; Notable for the following components:   Glucose-Capillary 143 (*)    All other components within normal limits  I-STAT CHEM 8, ED - Abnormal; Notable for the following components:   Glucose, Bld 120 (*)    All other components within normal limits    EKG None  Radiology Ct Head Wo Contrast  Result Date: 02/03/2018 CLINICAL DATA:  Headache for the last 7 days EXAM: CT HEAD WITHOUT CONTRAST TECHNIQUE: Contiguous axial images were obtained from the base of the  skull through the vertex without intravenous contrast. COMPARISON:  None. FINDINGS: Brain: The brainstem, cerebellum, cerebral peduncles, thalami, basal ganglia, basilar cisterns, and ventricular system appear within normal limits. No intracranial hemorrhage, mass lesion, or acute CVA. Vascular: There is atherosclerotic calcification of the cavernous carotid arteries bilaterally. Skull: Unremarkable Sinuses/Orbits: Unremarkable Other: No supplemental non-categorized findings. IMPRESSION: 1. No appreciable intracranial abnormality to account for the patient's headache. 2. There is atherosclerotic calcification of the cavernous carotid arteries bilaterally. Electronically Signed   By: Van Clines M.D.   On: 02/03/2018 12:39    Procedures Procedures (including critical care time)  Medications Ordered in ED Medications  ketorolac (TORADOL) 30 MG/ML injection 30 mg (30 mg Intravenous Given 02/03/18 1142)  metoCLOPramide (REGLAN) injection 10 mg (10 mg Intravenous Given 02/03/18 1142)     Initial Impression / Assessment and Plan / ED Course  I have reviewed the triage vital signs and the nursing notes.  Pertinent labs & imaging results that were available during my care of the patient were reviewed by me and considered in my medical decision making (see chart for details).  Patient feeling better after administration of Toradol and Reglan.  He  is neurologically intact and work-up is unremarkable.  CT scan is negative.  I see no indication for further work-up and believe the patient to be appropriate for discharge.  Final Clinical Impressions(s) / ED Diagnoses   Final diagnoses:  None    ED Discharge Orders    None       Veryl Speak, MD 02/03/18 1434

## 2018-02-03 NOTE — ED Triage Notes (Signed)
Pt here with multiple complaints; pt instructed to go to ED for further eval; pt declined transportation and ambulated out of [facility]

## 2018-02-05 ENCOUNTER — Encounter: Payer: Self-pay | Admitting: Family Medicine

## 2018-02-05 ENCOUNTER — Ambulatory Visit (INDEPENDENT_AMBULATORY_CARE_PROVIDER_SITE_OTHER): Payer: Medicare Other | Admitting: Family Medicine

## 2018-02-05 VITALS — BP 142/86 | HR 86 | Temp 98.3°F | Resp 18 | Ht 66.0 in | Wt 298.0 lb

## 2018-02-05 DIAGNOSIS — G43901 Migraine, unspecified, not intractable, with status migrainosus: Secondary | ICD-10-CM

## 2018-02-05 DIAGNOSIS — I10 Essential (primary) hypertension: Secondary | ICD-10-CM

## 2018-02-05 DIAGNOSIS — M79671 Pain in right foot: Secondary | ICD-10-CM

## 2018-02-05 MED ORDER — AMLODIPINE BESYLATE 10 MG PO TABS: 10.0000 mg | ORAL_TABLET | Freq: Every day | ORAL | 3 refills | Status: DC

## 2018-02-05 MED ORDER — PREDNISONE 20 MG PO TABS: ORAL_TABLET | ORAL | 0 refills | Status: DC

## 2018-02-05 NOTE — Progress Notes (Signed)
Subjective:     Patient ID: Kenneth [name], male   DOB: Jun 17, 1960, 57 y.o.   MRN: [id number]  HPI Patient has a history of chronic daily headache suspected due to migraine.  This was treated in the past with daily Topamax.  Since taking Topamax, his headaches have subsided.  Recently his headache has returned.  This occurred approximately 1 week ago.  Patient admits to drinking heavily 1 week ago.  He was doing this for several days.  He missed his Topamax for several days.  He started getting a frontal headache.  The pain is constant.  In the afternoons the pain worsens.  There is no photophobia or phonophobia.  It is a constant pressure-like pain.  He has resumed his Topamax however the headache is not yet gone away.  It feels and sounds more like a tension headache.  Exacerbating factors are elevated blood pressures.  He has been checking his blood pressure at home and his blood pressures have been averaging between 130 and 168/80-90.  In fact, one day he saw his blood pressure as high as 211 systolic.  He denies any chest pain or shortness of breath however he went to the emergency room.  In the emergency room, his headache improved with Toradol.  A head CT was negative for any acute intracranial process.  He denies any falls or concussions while drinking to the best of his knowledge.  He denies any neurologic deficit.  He also reports a one-week history of heel pain.  On examination of the posterior aspect of his calcaneus is a vertical skin laceration.  It is 3 cm long.  It is a sharp "paper cut" like laceration.  There is thick hyperkeratotic scar forming around it preventing it from healing.  Whenever he steps, his body weight splits the fissure in the skin causing heel pain Past Medical History:  Diagnosis Date  . Arthritis   . Asthma   . Chronic bronchitis ([place])   . GERD (gastroesophageal reflux disease)   . Hypercholesteremia   . Hypertension   . OSA treated with BiPAP    ahi-132, bipap 23/19     Past Surgical History:  Procedure Laterality Date  . ESOPHAGOGASTRODUODENOSCOPY (EGD) WITH PROPOFOL N/A 12/11/2012   Procedure: ESOPHAGOGASTRODUODENOSCOPY (EGD) WITH PROPOFOL;  Surgeon: Arta Silence, MD;  Location: [facility];  Service: Endoscopy;  Laterality: N/A;  . FINGER AMPUTATION    . KNEE ARTHROSCOPY Left 11/15/2015   Procedure: ARTHROSCOPY KNEE AND MEDIAL MENISECTOMY;  Surgeon: Paralee Cancel, MD;  Location: [facility];  Service: Orthopedics;  Laterality: Left;   Current Outpatient Medications on File Prior to Visit  Medication Sig Dispense Refill  . aspirin 81 MG tablet Take 81 mg by mouth daily.    Marland Kitchen atorvastatin (LIPITOR) 10 MG tablet TAKE 1 TABLET BY MOUTH DAILY. 90 tablet 3  . Blood Glucose Monitoring Suppl (ACCU-CHEK AVIVA PLUS) w/Device KIT Check BS bid 1 kit 1  . Blood Glucose Monitoring Suppl (ONETOUCH VERIO) w/Device KIT     . fluticasone (FLONASE) 50 MCG/ACT nasal spray Place 1 spray into both nostrils daily. 16 g 2  . furosemide (LASIX) 20 MG tablet TAKE 1 TABLET BY MOUTH DAILY. 30 tablet 5  . lansoprazole (PREVACID) 30 MG capsule TAKE 1 CAPSULE BY MOUTH DAILY AT 12:00 NOON. 90 capsule 1  . meloxicam (MOBIC) 15 MG tablet Take 1 tablet (15 mg total) by mouth daily. 30 tablet 0  . metFORMIN (GLUCOPHAGE) 500 MG tablet Take 1 tablet (500  mg total) by mouth 2 (two) times daily with a meal. 180 tablet 3  . nitroGLYCERIN (NITROSTAT) 0.4 MG SL tablet Place 1 tablet (0.4 mg total) under the tongue every 5 (five) minutes as needed. For chest pain 20 tablet 1  . omeprazole (PRILOSEC) 40 MG capsule Take 1 capsule (40 mg total) by mouth daily. 90 capsule 3  . PROAIR HFA 108 (90 BASE) MCG/ACT inhaler INHALE 2 PUFFS INTO THE LUNGS EVERY 4 HOURS AS NEEDED FOR SHORTNESS OF BREATH 8.5 g 2  . topiramate (TOPAMAX) 25 MG tablet TAKE 2 TABLETS BY MOUTH 2 TIMES DAILY. 120 tablet 1  . UNABLE TO FIND Lidocaine ointment 5%     Current Facility-Administered Medications on File Prior to Visit   Medication Dose Route Frequency Provider Last Rate Last Dose  . lactated ringers infusion    Continuous PRN Chyrel Masson, CRNA       No Known Allergies Social History   Socioeconomic History  . Marital status: Divorced    Spouse name: Not on file  . Number of children: 0  . Years of education: Not on file  . Highest education level: Not on file  Occupational History  . Not on file  Social Needs  . Financial resource strain: Not on file  . Food insecurity:    Worry: Not on file    Inability: Not on file  . Transportation needs:    Medical: Not on file    Non-medical: Not on file  Tobacco Use  . Smoking status: Current Every Day Smoker    Packs/day: 1.00    Years: 40.00    Pack years: 40.00    Types: Cigarettes  . Smokeless tobacco: Never Used  . Tobacco comment: 06/05/17 1 pack per 1 1/2 days  Substance and Sexual Activity  . Alcohol use: Yes    Alcohol/week: 0.0 standard drinks    Comment: some  . Drug use: Yes    Types: Cocaine    Comment: patient states none- read lab report 09/02/2012/ 11-20-12 case cx.due to positive screen test-pt. advised no recreational drugs to be used  . Sexual activity: Not on file  Lifestyle  . Physical activity:    Days per week: Not on file    Minutes per session: Not on file  . Stress: Not on file  Relationships  . Social connections:    Talks on phone: Not on file    Gets together: Not on file    Attends religious service: Not on file    Active member of club or organization: Not on file    Attends meetings of clubs or organizations: Not on file    Relationship status: Not on file  . Intimate partner violence:    Fear of current or ex partner: Not on file    Emotionally abused: Not on file    Physically abused: Not on file    Forced sexual activity: Not on file  Other Topics Concern  . Not on file  Social History Narrative   Lives with brother and his sister in [place]       Review of Systems  All other systems  reviewed and are negative.      Objective:   Physical Exam  Constitutional: He is oriented to person, place, and time.  Cardiovascular: Normal rate, regular rhythm and normal heart sounds.  Pulmonary/Chest: Effort normal and breath sounds normal. No respiratory distress. He has no wheezes. He has no rales.  Musculoskeletal:  Right ankle: He exhibits swelling and laceration. Tenderness. No lateral malleolus, no medial malleolus, no AITFL and no head of 5th metatarsal tenderness found. Achilles tendon exhibits no pain, no defect and normal Thompson's test results.       Feet:  Neurological: He is alert and oriented to person, place, and time. He has normal reflexes. He displays normal reflexes. No cranial nerve deficit. He exhibits normal muscle tone. Coordination normal.  Vitals reviewed.      Assessment:      Benign essential HTN  Pain of right heel  Status migrainosus      Plan:     I believe his headache is likely should have his underlying headache disorder which I believe is migraine in nature versus chronic tension headache.  I believe this occurred due to the discontinuation of his Topamax and his heavy drinking.  He is now abstaining from alcohol and has resumed his Topamax.  I will treat the patient similar to status migrainosus with a prednisone taper pack.  We will also treat his blood pressure with amlodipine 10 mg a day.  The lesion on his heel, I treated by debriding away the thick hyperkeratotic tissue from both edges of the laceration.  I then coated the area with Silvadene and dressed with a nonadherent gauze and Coban.  Recheck the patient and redress the wound in 48 hours and continue this until healed.

## 2018-02-07 ENCOUNTER — Encounter: Payer: Self-pay | Admitting: Family Medicine

## 2018-02-07 ENCOUNTER — Ambulatory Visit (INDEPENDENT_AMBULATORY_CARE_PROVIDER_SITE_OTHER): Payer: Medicare Other | Admitting: Family Medicine

## 2018-02-07 VITALS — BP 120/76 | HR 104 | Temp 97.7°F | Ht 66.0 in | Wt 298.0 lb

## 2018-02-07 DIAGNOSIS — G43901 Migraine, unspecified, not intractable, with status migrainosus: Secondary | ICD-10-CM | POA: Diagnosis not present

## 2018-02-07 DIAGNOSIS — M79671 Pain in right foot: Secondary | ICD-10-CM | POA: Diagnosis not present

## 2018-02-07 DIAGNOSIS — I10 Essential (primary) hypertension: Secondary | ICD-10-CM

## 2018-02-07 NOTE — Progress Notes (Signed)
Subjective:     Patient ID: Kenneth [name], male   DOB: October 31, 1960, 57 y.o.   MRN: [id number]  HPI  02/05/18 Patient has a history of chronic daily headache suspected due to migraine.  This was treated in the past with daily Topamax.  Since taking Topamax, his headaches have subsided.  Recently his headache has returned.  This occurred approximately 1 week ago.  Patient admits to drinking heavily 1 week ago.  He was doing this for several days.  He missed his Topamax for several days.  He started getting a frontal headache.  The pain is constant.  In the afternoons the pain worsens.  There is no photophobia or phonophobia.  It is a constant pressure-like pain.  He has resumed his Topamax however the headache is not yet gone away.  It feels and sounds more like a tension headache.  Exacerbating factors are elevated blood pressures.  He has been checking his blood pressure at home and his blood pressures have been averaging between 130 and 168/80-90.  In fact, one day he saw his blood pressure as high as 417 systolic.  He denies any chest pain or shortness of breath however he went to the emergency room.  In the emergency room, his headache improved with Toradol.  A head CT was negative for any acute intracranial process.  He denies any falls or concussions while drinking to the best of his knowledge.  He denies any neurologic deficit.  He also reports a one-week history of heel pain.  On examination of the posterior aspect of his calcaneus is a vertical skin laceration.  It is 3 cm long.  It is a sharp "paper cut" like laceration.  There is thick hyperkeratotic scar forming around it preventing it from healing.  Whenever he steps, his body weight splits the fissure in the skin causing heel pain.  At that time, my plan was: I believe his headache is likely should have his underlying headache disorder which I believe is migraine in nature versus chronic tension headache.  I believe this occurred due to the  discontinuation of his Topamax and his heavy drinking.  He is now abstaining from alcohol and has resumed his Topamax.  I will treat the patient similar to status migrainosus with a prednisone taper pack.  We will also treat his blood pressure with amlodipine 10 mg a day.  The lesion on his heel, I treated by debriding away the thick hyperkeratotic tissue from both edges of the laceration.  I then coated the area with Silvadene and dressed with a nonadherent gauze and Coban.  Recheck the patient and redress the wound in 48 hours and continue this until healed.  02/07/18 The wound looks much better today.  The edges are slowly coming together.  The hyperkeratotic tissue has been removed and the skin is much more supple having been dressed and bandaged appropriately for the last 48 hours.  There is no erythema or exudate or evidence of cellulitis.  The patient's headache has completely resolved on prednisone.  His blood pressure is much better at 120/76.  Overall he is feeling much better Past Medical History:  Diagnosis Date  . Arthritis   . Asthma   . Chronic bronchitis ([place])   . GERD (gastroesophageal reflux disease)   . Hypercholesteremia   . Hypertension   . OSA treated with BiPAP    ahi-132, bipap 23/19   Past Surgical History:  Procedure Laterality Date  . ESOPHAGOGASTRODUODENOSCOPY (EGD) WITH PROPOFOL  N/A 12/11/2012   Procedure: ESOPHAGOGASTRODUODENOSCOPY (EGD) WITH PROPOFOL;  Surgeon: Arta Silence, MD;  Location: [facility];  Service: Endoscopy;  Laterality: N/A;  . FINGER AMPUTATION    . KNEE ARTHROSCOPY Left 11/15/2015   Procedure: ARTHROSCOPY KNEE AND MEDIAL MENISECTOMY;  Surgeon: Paralee Cancel, MD;  Location: [facility];  Service: Orthopedics;  Laterality: Left;   Current Outpatient Medications on File Prior to Visit  Medication Sig Dispense Refill  . amLODipine (NORVASC) 10 MG tablet Take 1 tablet (10 mg total) by mouth daily. 90 tablet 3  . aspirin 81 MG tablet Take 81 mg by mouth  daily.    Marland Kitchen atorvastatin (LIPITOR) 10 MG tablet TAKE 1 TABLET BY MOUTH DAILY. 90 tablet 3  . Blood Glucose Monitoring Suppl (ACCU-CHEK AVIVA PLUS) w/Device KIT Check BS bid 1 kit 1  . Blood Glucose Monitoring Suppl (ONETOUCH VERIO) w/Device KIT     . fluticasone (FLONASE) 50 MCG/ACT nasal spray Place 1 spray into both nostrils daily. 16 g 2  . furosemide (LASIX) 20 MG tablet TAKE 1 TABLET BY MOUTH DAILY. 30 tablet 5  . lansoprazole (PREVACID) 30 MG capsule TAKE 1 CAPSULE BY MOUTH DAILY AT 12:00 NOON. 90 capsule 1  . meloxicam (MOBIC) 15 MG tablet Take 1 tablet (15 mg total) by mouth daily. 30 tablet 0  . metFORMIN (GLUCOPHAGE) 500 MG tablet Take 1 tablet (500 mg total) by mouth 2 (two) times daily with a meal. 180 tablet 3  . nitroGLYCERIN (NITROSTAT) 0.4 MG SL tablet Place 1 tablet (0.4 mg total) under the tongue every 5 (five) minutes as needed. For chest pain 20 tablet 1  . omeprazole (PRILOSEC) 40 MG capsule Take 1 capsule (40 mg total) by mouth daily. 90 capsule 3  . predniSONE (DELTASONE) 20 MG tablet 3 tabs poqday 1-2, 2 tabs poqday 3-4, 1 tab poqday 5-6 12 tablet 0  . PROAIR HFA 108 (90 BASE) MCG/ACT inhaler INHALE 2 PUFFS INTO THE LUNGS EVERY 4 HOURS AS NEEDED FOR SHORTNESS OF BREATH 8.5 g 2  . topiramate (TOPAMAX) 25 MG tablet TAKE 2 TABLETS BY MOUTH 2 TIMES DAILY. 120 tablet 1  . UNABLE TO FIND Lidocaine ointment 5%     Current Facility-Administered Medications on File Prior to Visit  Medication Dose Route Frequency Provider Last Rate Last Dose  . lactated ringers infusion    Continuous PRN Chyrel Masson, CRNA       No Known Allergies Social History   Socioeconomic History  . Marital status: Divorced    Spouse name: Not on file  . Number of children: 0  . Years of education: Not on file  . Highest education level: Not on file  Occupational History  . Not on file  Social Needs  . Financial resource strain: Not on file  . Food insecurity:    Worry: Not on file     Inability: Not on file  . Transportation needs:    Medical: Not on file    Non-medical: Not on file  Tobacco Use  . Smoking status: Current Every Day Smoker    Packs/day: 1.00    Years: 40.00    Pack years: 40.00    Types: Cigarettes  . Smokeless tobacco: Never Used  . Tobacco comment: 06/05/17 1 pack per 1 1/2 days  Substance and Sexual Activity  . Alcohol use: Yes    Alcohol/week: 0.0 standard drinks    Comment: some  . Drug use: Yes    Types: Cocaine  Comment: patient states none- read lab report 09/02/2012/ 11-20-12 case cx.due to positive screen test-pt. advised no recreational drugs to be used  . Sexual activity: Not on file  Lifestyle  . Physical activity:    Days per week: Not on file    Minutes per session: Not on file  . Stress: Not on file  Relationships  . Social connections:    Talks on phone: Not on file    Gets together: Not on file    Attends religious service: Not on file    Active member of club or organization: Not on file    Attends meetings of clubs or organizations: Not on file    Relationship status: Not on file  . Intimate partner violence:    Fear of current or ex partner: Not on file    Emotionally abused: Not on file    Physically abused: Not on file    Forced sexual activity: Not on file  Other Topics Concern  . Not on file  Social History Narrative   Lives with brother and his sister in [place].       Review of Systems  All other systems reviewed and are negative.      Objective:   Physical Exam  Constitutional: He is oriented to person, place, and time.  Cardiovascular: Normal rate, regular rhythm and normal heart sounds.  Pulmonary/Chest: Effort normal and breath sounds normal. No respiratory distress. He has no wheezes. He has no rales.  Musculoskeletal:       Right ankle: He exhibits swelling and laceration. Tenderness. No lateral malleolus, no medial malleolus, no AITFL and no head of 5th metatarsal tenderness found. Achilles  tendon exhibits no pain, no defect and normal Thompson's test results.       Feet:  Neurological: He is alert and oriented to person, place, and time. He has normal reflexes. No cranial nerve deficit. He exhibits normal muscle tone. Coordination normal.  Vitals reviewed.      Assessment:      Pain of right heel  Status migrainosus  Benign essential HTN We discussed wound care.  I recommended daily dressing changes to the lesion on his right heel.  He will apply a copious amount of Polysporin to the wound, cover with a nonadherent gauze and then wrapping Coban.  He will perform daily dressing changes over the weekend and then we will recheck on Monday.  I anticipate the wound should heal by them.  His heel pain has since resolved now that the wound is looking better.  His headaches have gone away.  His blood pressure is much better controlled.  Therefore I recommend he continue to complete the prednisone taper back and continue to take the amlodipine.     Plan:

## 2018-02-12 ENCOUNTER — Telehealth: Payer: Self-pay | Admitting: Family Medicine

## 2018-02-12 ENCOUNTER — Other Ambulatory Visit: Payer: Self-pay | Admitting: Family Medicine

## 2018-02-12 DIAGNOSIS — G4733 Obstructive sleep apnea (adult) (pediatric): Secondary | ICD-10-CM | POA: Diagnosis not present

## 2018-02-12 MED ORDER — BUTALBITAL-APAP-CAFFEINE 50-325-40 MG PO TABS: 1.0000 | ORAL_TABLET | Freq: Four times a day (QID) | ORAL | 0 refills | Status: DC | PRN

## 2018-02-12 NOTE — Telephone Encounter (Signed)
Mary called in stating pt has followed all instructions per dr. Dennard Schaumann for his head and it is still killing him   Please advise on what next steps are.

## 2018-02-12 NOTE — Telephone Encounter (Signed)
I would add Fioricet 1 tablet every 6 hours as needed for headache and recheck if no better in 2 days

## 2018-02-13 NOTE — Telephone Encounter (Signed)
Pt's sister aware and MD sent in medication

## 2018-02-25 ENCOUNTER — Other Ambulatory Visit: Payer: Self-pay | Admitting: Family Medicine

## 2018-02-25 DIAGNOSIS — G4452 New daily persistent headache (NDPH): Secondary | ICD-10-CM

## 2018-02-28 ENCOUNTER — Ambulatory Visit (INDEPENDENT_AMBULATORY_CARE_PROVIDER_SITE_OTHER): Payer: Medicare Other | Admitting: Family Medicine

## 2018-02-28 ENCOUNTER — Encounter: Payer: Self-pay | Admitting: Family Medicine

## 2018-02-28 VITALS — BP 140/70 | HR 90 | Temp 98.4°F | Resp 20 | Ht 66.0 in | Wt 296.0 lb

## 2018-02-28 DIAGNOSIS — R519 Headache, unspecified: Secondary | ICD-10-CM

## 2018-02-28 DIAGNOSIS — R51 Headache: Secondary | ICD-10-CM | POA: Diagnosis not present

## 2018-02-28 MED ORDER — AMITRIPTYLINE HCL 50 MG PO TABS: 50.0000 mg | ORAL_TABLET | Freq: Every day | ORAL | 0 refills | Status: DC

## 2018-02-28 NOTE — Progress Notes (Signed)
Subjective:     Patient ID: Kenneth [name], male   DOB: [birth year], 57 y.o.   MRN: [id number]  Wound Check     02/05/18 Patient has a history of chronic daily headache suspected due to migraine.  This was treated in the past with daily Topamax.  Since taking Topamax, his headaches have subsided.  Recently his headache has returned.  This occurred approximately 1 week ago.  Patient admits to drinking heavily 1 week ago.  He was doing this for several days.  He missed his Topamax for several days.  He started getting a frontal headache.  The pain is constant.  In the afternoons the pain worsens.  There is no photophobia or phonophobia.  It is a constant pressure-like pain.  He has resumed his Topamax however the headache is not yet gone away.  It feels and sounds more like a tension headache.  Exacerbating factors are elevated blood pressures.  He has been checking his blood pressure at home and his blood pressures have been averaging between 130 and 168/80-90.  In fact, one day he saw his blood pressure as high as 740 systolic.  He denies any chest pain or shortness of breath however he went to the emergency room.  In the emergency room, his headache improved with Toradol.  A head CT was negative for any acute intracranial process.  He denies any falls or concussions while drinking to the best of his knowledge.  He denies any neurologic deficit.  He also reports a one-week history of heel pain.  On examination of the posterior aspect of his calcaneus is a vertical skin laceration.  It is 3 cm long.  It is a sharp "paper cut" like laceration.  There is thick hyperkeratotic scar forming around it preventing it from healing.  Whenever he steps, his body weight splits the fissure in the skin causing heel pain.  At that time, my plan was: I believe his headache is likely should have his underlying headache disorder which I believe is migraine in nature versus chronic tension headache.  I believe this occurred due to  the discontinuation of his Topamax and his heavy drinking.  He is now abstaining from alcohol and has resumed his Topamax.  I will treat the patient similar to status migrainosus with a prednisone taper pack.  We will also treat his blood pressure with amlodipine 10 mg a day.  The lesion on his heel, I treated by debriding away the thick hyperkeratotic tissue from both edges of the laceration.  I then coated the area with Silvadene and dressed with a nonadherent gauze and Coban.  Recheck the patient and redress the wound in 48 hours and continue this until healed.  02/07/18 The wound looks much better today.  The edges are slowly coming together.  The hyperkeratotic tissue has been removed and the skin is much more supple having been dressed and bandaged appropriately for the last 48 hours.  There is no erythema or exudate or evidence of cellulitis.  The patient's headache has completely resolved on prednisone.  His blood pressure is much better at 120/76.  Overall he is feeling much better.  At that time, my plan was: We discussed wound care.  I recommended daily dressing changes to the lesion on his right heel.  He will apply a copious amount of Polysporin to the wound, cover with a nonadherent gauze and then wrapping Coban.  He will perform daily dressing changes over the weekend and then we  will recheck on Monday.  I anticipate the wound should heal by them.  His heel pain has since resolved now that the wound is looking better.  His headaches have gone away.  His blood pressure is much better controlled.  Therefore I recommend he continue to complete the prednisone taper back and continue to take the amlodipine.  02/28/18 Patient called back shortly thereafter and stated the headaches had returned just as severe as before despite the fact he was not drinking alcohol and despite the fact he was wearing his CPAP machine.  We started the patient on Fioricet to be used as needed for breakthrough headaches.   He states this medication helps very little.  He states he has the headaches every day all day.  He reports the headaches are in a bandlike fashion in the front of his forehead just above his eyes.  He describes it as a constant nagging pressure-like pain that will not go away.  He denies any photophobia.  He denies any phonophobia.  He denies any nausea or vomiting.  Headaches are not unilateral.  There is no rhinorrhea associated with them.  There is no unilateral lacrimation.  There is no blurry vision or vision changes.  Headaches sound more like chronic tension headaches.  Still taking Topamax 50 mg twice daily Past Medical History:  Diagnosis Date  . Arthritis   . Asthma   . Chronic bronchitis ([place])   . GERD (gastroesophageal reflux disease)   . Hypercholesteremia   . Hypertension   . OSA treated with BiPAP    ahi-132, bipap 23/19   Past Surgical History:  Procedure Laterality Date  . ESOPHAGOGASTRODUODENOSCOPY (EGD) WITH PROPOFOL N/A 12/11/2012   Procedure: ESOPHAGOGASTRODUODENOSCOPY (EGD) WITH PROPOFOL;  Surgeon: Arta Silence, MD;  Location: [facility];  Service: Endoscopy;  Laterality: N/A;  . FINGER AMPUTATION    . KNEE ARTHROSCOPY Left 11/15/2015   Procedure: ARTHROSCOPY KNEE AND MEDIAL MENISECTOMY;  Surgeon: Paralee Cancel, MD;  Location: [facility];  Service: Orthopedics;  Laterality: Left;   Current Outpatient Medications on File Prior to Visit  Medication Sig Dispense Refill  . amLODipine (NORVASC) 10 MG tablet Take 1 tablet (10 mg total) by mouth daily. 90 tablet 3  . aspirin 81 MG tablet Take 81 mg by mouth daily.    Marland Kitchen atorvastatin (LIPITOR) 10 MG tablet TAKE 1 TABLET BY MOUTH DAILY. 90 tablet 3  . Blood Glucose Monitoring Suppl (ACCU-CHEK AVIVA PLUS) w/Device KIT Check BS bid 1 kit 1  . Blood Glucose Monitoring Suppl (ONETOUCH VERIO) w/Device KIT     . butalbital-acetaminophen-caffeine (FIORICET, ESGIC) 50-325-40 MG tablet Take 1-2 tablets by mouth every 6 (six) hours as  needed for headache. 20 tablet 0  . fluticasone (FLONASE) 50 MCG/ACT nasal spray Place 1 spray into both nostrils daily. 16 g 2  . furosemide (LASIX) 20 MG tablet TAKE 1 TABLET BY MOUTH DAILY. 30 tablet 5  . lansoprazole (PREVACID) 30 MG capsule TAKE 1 CAPSULE BY MOUTH DAILY AT 12:00 NOON. 90 capsule 1  . meloxicam (MOBIC) 15 MG tablet Take 1 tablet (15 mg total) by mouth daily. 30 tablet 0  . metFORMIN (GLUCOPHAGE) 500 MG tablet Take 1 tablet (500 mg total) by mouth 2 (two) times daily with a meal. 180 tablet 3  . nitroGLYCERIN (NITROSTAT) 0.4 MG SL tablet Place 1 tablet (0.4 mg total) under the tongue every 5 (five) minutes as needed. For chest pain 20 tablet 1  . omeprazole (PRILOSEC) 40 MG capsule TAKE  1 CAPSULE BY MOUTH DAILY. 90 capsule 3  . predniSONE (DELTASONE) 20 MG tablet 3 tabs poqday 1-2, 2 tabs poqday 3-4, 1 tab poqday 5-6 12 tablet 0  . PROAIR HFA 108 (90 BASE) MCG/ACT inhaler INHALE 2 PUFFS INTO THE LUNGS EVERY 4 HOURS AS NEEDED FOR SHORTNESS OF BREATH 8.5 g 2  . topiramate (TOPAMAX) 25 MG tablet TAKE 2 TABLETS BY MOUTH 2 TIMES DAILY. 120 tablet 1  . UNABLE TO FIND Lidocaine ointment 5%     Current Facility-Administered Medications on File Prior to Visit  Medication Dose Route Frequency Provider Last Rate Last Dose  . lactated ringers infusion    Continuous PRN Chyrel Masson, CRNA       No Known Allergies Social History   Socioeconomic History  . Marital status: Divorced    Spouse name: Not on file  . Number of children: 0  . Years of education: Not on file  . Highest education level: Not on file  Occupational History  . Not on file  Social Needs  . Financial resource strain: Not on file  . Food insecurity:    Worry: Not on file    Inability: Not on file  . Transportation needs:    Medical: Not on file    Non-medical: Not on file  Tobacco Use  . Smoking status: Current Every Day Smoker    Packs/day: 1.00    Years: 40.00    Pack years: 40.00    Types:  Cigarettes  . Smokeless tobacco: Never Used  . Tobacco comment: 06/05/17 1 pack per 1 1/2 days  Substance and Sexual Activity  . Alcohol use: Yes    Alcohol/week: 0.0 standard drinks    Comment: some  . Drug use: Yes    Types: Cocaine    Comment: patient states none- read lab report 09/02/2012/ 11-20-12 case cx.due to positive screen test-pt. advised no recreational drugs to be used  . Sexual activity: Not on file  Lifestyle  . Physical activity:    Days per week: Not on file    Minutes per session: Not on file  . Stress: Not on file  Relationships  . Social connections:    Talks on phone: Not on file    Gets together: Not on file    Attends religious service: Not on file    Active member of club or organization: Not on file    Attends meetings of clubs or organizations: Not on file    Relationship status: Not on file  . Intimate partner violence:    Fear of current or ex partner: Not on file    Emotionally abused: Not on file    Physically abused: Not on file    Forced sexual activity: Not on file  Other Topics Concern  . Not on file  Social History Narrative   Lives with brother and his sister in [place].       Review of Systems  All other systems reviewed and are negative.      Objective:   Physical Exam  Constitutional: He is oriented to person, place, and time.  Cardiovascular: Normal rate, regular rhythm and normal heart sounds.  Pulmonary/Chest: Effort normal and breath sounds normal. No respiratory distress. He has no wheezes. He has no rales.  Neurological: He is alert and oriented to person, place, and time. He has normal reflexes. No cranial nerve deficit. He exhibits normal muscle tone. Coordination normal.  Vitals reviewed.      Assessment:  Chronic daily headache - Plan: AMB referral to headache clinic, amitriptyline (ELAVIL) 50 MG tablet      Plan:     Headaches sound like a chronic tension headache.  He has tried and failed Topamax.  He is  tried and failed prednisone.  He is tried and failed Fioricet.  We discussed options today including adding a try cyclic antidepressant as prevention for chronic tension headaches, trying Aimovig for possible atypical migraines, or a referral to a headache specialist for possible Botox injections.  After long discussion he would like to try amitriptyline 50 mg p.o. nightly.  If headaches are no better in 2 weeks, I would recommend a consultation with neurology.  Patient is comfortable with this plan.

## 2018-03-28 ENCOUNTER — Other Ambulatory Visit: Payer: Self-pay | Admitting: Family Medicine

## 2018-04-12 DIAGNOSIS — M1711 Unilateral primary osteoarthritis, right knee: Secondary | ICD-10-CM | POA: Diagnosis not present

## 2018-04-12 DIAGNOSIS — G8929 Other chronic pain: Secondary | ICD-10-CM | POA: Diagnosis not present

## 2018-04-12 DIAGNOSIS — Z79891 Long term (current) use of opiate analgesic: Secondary | ICD-10-CM | POA: Diagnosis not present

## 2018-04-12 DIAGNOSIS — M1712 Unilateral primary osteoarthritis, left knee: Secondary | ICD-10-CM | POA: Diagnosis not present

## 2018-04-12 DIAGNOSIS — G894 Chronic pain syndrome: Secondary | ICD-10-CM | POA: Diagnosis not present

## 2018-04-12 DIAGNOSIS — G890 Central pain syndrome: Secondary | ICD-10-CM | POA: Diagnosis not present

## 2018-04-12 DIAGNOSIS — M25562 Pain in left knee: Secondary | ICD-10-CM | POA: Diagnosis not present

## 2018-04-12 DIAGNOSIS — M25561 Pain in right knee: Secondary | ICD-10-CM | POA: Diagnosis not present

## 2018-04-17 ENCOUNTER — Encounter: Payer: Self-pay | Admitting: Adult Health

## 2018-04-18 ENCOUNTER — Telehealth: Payer: Self-pay | Admitting: Adult Health

## 2018-04-18 DIAGNOSIS — G4733 Obstructive sleep apnea (adult) (pediatric): Secondary | ICD-10-CM | POA: Diagnosis not present

## 2018-04-18 NOTE — Telephone Encounter (Signed)
Received reply from [facility]: order received.

## 2018-04-18 NOTE — Addendum Note (Signed)
Addended by: Star Age on: 04/18/2018 11:55 AM   Modules accepted: Orders

## 2018-04-18 NOTE — Telephone Encounter (Signed)
Pt has called while @ [facility], he was told the noise he hears it because CPAP is all the way turned up.  Pt is asking for a call to know if he can have it turned down.  Please call

## 2018-04-18 NOTE — Telephone Encounter (Signed)
Order sent to [facility] for them to make the pressure changes.

## 2018-04-18 NOTE — Telephone Encounter (Signed)
I reviewed his BiPAP compliance data from the past 30 days, he is adequate with his compliance, AHI at goal at 1.1, leak acceptable. Pressure is indeed high, we can try to reduce the pressure to 21/17 cm to see if he tolerates it better. Please update patient and sent order to his DME company.

## 2018-04-18 NOTE — Telephone Encounter (Signed)
Called patient and informed him of Dr Guadelupe Sabin message. Wife asked if they should bring machine to this office, and I advised her no, that we do not service the machines. I advised he call [facility], that the order was just sent in. He  verbalized understanding, appreciation.

## 2018-04-18 NOTE — Telephone Encounter (Signed)
30 day compliance report on Dr Guadelupe Sabin desk.

## 2018-04-18 NOTE — Telephone Encounter (Signed)
Please print a 30 d download for my review.

## 2018-04-26 ENCOUNTER — Other Ambulatory Visit: Payer: Self-pay | Admitting: Family Medicine

## 2018-04-26 DIAGNOSIS — M1711 Unilateral primary osteoarthritis, right knee: Secondary | ICD-10-CM | POA: Diagnosis not present

## 2018-04-26 DIAGNOSIS — Z79891 Long term (current) use of opiate analgesic: Secondary | ICD-10-CM | POA: Diagnosis not present

## 2018-04-26 DIAGNOSIS — M1712 Unilateral primary osteoarthritis, left knee: Secondary | ICD-10-CM | POA: Diagnosis not present

## 2018-04-26 DIAGNOSIS — M25562 Pain in left knee: Secondary | ICD-10-CM

## 2018-04-26 DIAGNOSIS — M25561 Pain in right knee: Secondary | ICD-10-CM | POA: Diagnosis not present

## 2018-04-26 DIAGNOSIS — G8929 Other chronic pain: Secondary | ICD-10-CM | POA: Diagnosis not present

## 2018-04-26 DIAGNOSIS — G894 Chronic pain syndrome: Secondary | ICD-10-CM | POA: Diagnosis not present

## 2018-05-09 DIAGNOSIS — M25562 Pain in left knee: Secondary | ICD-10-CM | POA: Diagnosis not present

## 2018-05-09 DIAGNOSIS — M25561 Pain in right knee: Secondary | ICD-10-CM | POA: Diagnosis not present

## 2018-05-09 DIAGNOSIS — M1711 Unilateral primary osteoarthritis, right knee: Secondary | ICD-10-CM | POA: Diagnosis not present

## 2018-05-09 DIAGNOSIS — M1712 Unilateral primary osteoarthritis, left knee: Secondary | ICD-10-CM | POA: Diagnosis not present

## 2018-05-15 ENCOUNTER — Other Ambulatory Visit: Payer: Self-pay | Admitting: Family Medicine

## 2018-05-15 DIAGNOSIS — G4452 New daily persistent headache (NDPH): Secondary | ICD-10-CM

## 2018-05-21 DIAGNOSIS — G4733 Obstructive sleep apnea (adult) (pediatric): Secondary | ICD-10-CM | POA: Diagnosis not present

## 2018-06-21 ENCOUNTER — Other Ambulatory Visit: Payer: Self-pay | Admitting: Family Medicine

## 2018-06-21 DIAGNOSIS — R739 Hyperglycemia, unspecified: Secondary | ICD-10-CM

## 2018-07-18 DIAGNOSIS — G4733 Obstructive sleep apnea (adult) (pediatric): Secondary | ICD-10-CM | POA: Diagnosis not present

## 2018-07-26 ENCOUNTER — Other Ambulatory Visit: Payer: Self-pay | Admitting: Family Medicine

## 2018-07-26 MED ORDER — BUTALBITAL-APAP-CAFFEINE 50-325-40 MG PO TABS: 1.0000 | ORAL_TABLET | Freq: Four times a day (QID) | ORAL | 0 refills | Status: AC | PRN

## 2018-07-29 DIAGNOSIS — M1711 Unilateral primary osteoarthritis, right knee: Secondary | ICD-10-CM | POA: Diagnosis not present

## 2018-07-29 DIAGNOSIS — M1712 Unilateral primary osteoarthritis, left knee: Secondary | ICD-10-CM | POA: Diagnosis not present

## 2018-08-05 ENCOUNTER — Ambulatory Visit (INDEPENDENT_AMBULATORY_CARE_PROVIDER_SITE_OTHER): Payer: Medicare Other | Admitting: Family Medicine

## 2018-08-05 ENCOUNTER — Other Ambulatory Visit: Payer: Self-pay

## 2018-08-05 ENCOUNTER — Encounter: Payer: Self-pay | Admitting: Family Medicine

## 2018-08-05 VITALS — BP 150/60 | HR 94 | Temp 98.8°F | Resp 20 | Ht 66.0 in | Wt 288.0 lb

## 2018-08-05 DIAGNOSIS — M7552 Bursitis of left shoulder: Secondary | ICD-10-CM | POA: Diagnosis not present

## 2018-08-05 NOTE — Progress Notes (Signed)
Subjective:     Patient ID: Kenneth [name], male   DOB: April 10, 1960, 58 y.o.   MRN: [id number]  HPI Patient reports a 3 week history of pain in his left shoulder. Patient reports no specific injury. However it hurts to abduct his shoulder greater than 90. It hurts to sleep on the shoulder at night. Is no crepitus in the shoulder. He has a positive empty can sign. He has a positive Hawkins sign. He has pain with passive range of motion and abduction greater than 120. There is no pain with internal or external rotation.  Past Medical History:  Diagnosis Date  . Arthritis   . Asthma   . Chronic bronchitis ([place])   . GERD (gastroesophageal reflux disease)   . Hypercholesteremia   . Hypertension   . OSA treated with BiPAP    ahi-132, bipap 23/19   Past Surgical History:  Procedure Laterality Date  . ESOPHAGOGASTRODUODENOSCOPY (EGD) WITH PROPOFOL N/A 12/11/2012   Procedure: ESOPHAGOGASTRODUODENOSCOPY (EGD) WITH PROPOFOL;  Surgeon: Arta Silence, MD;  Location: [facility];  Service: Endoscopy;  Laterality: N/A;  . FINGER AMPUTATION    . KNEE ARTHROSCOPY Left 11/15/2015   Procedure: ARTHROSCOPY KNEE AND MEDIAL MENISECTOMY;  Surgeon: Paralee Cancel, MD;  Location: [facility];  Service: Orthopedics;  Laterality: Left;   Current Outpatient Medications on File Prior to Visit  Medication Sig Dispense Refill  . amitriptyline (ELAVIL) 50 MG tablet Take 1 tablet (50 mg total) by mouth at bedtime. 30 tablet 0  . amLODipine (NORVASC) 10 MG tablet Take 1 tablet (10 mg total) by mouth daily. 90 tablet 3  . aspirin 81 MG tablet Take 81 mg by mouth daily.    Marland Kitchen atorvastatin (LIPITOR) 10 MG tablet TAKE 1 TABLET BY MOUTH DAILY. 90 tablet 3  . Blood Glucose Monitoring Suppl (ACCU-CHEK AVIVA PLUS) w/Device KIT Check BS bid 1 kit 1  . Blood Glucose Monitoring Suppl (ONETOUCH VERIO) w/Device KIT     . butalbital-acetaminophen-caffeine (FIORICET) 50-325-40 MG tablet Take 1-2 tablets by mouth every 6 (six) hours as needed  for headache. 20 tablet 0  . fluticasone (FLONASE) 50 MCG/ACT nasal spray Place 1 spray into both nostrils daily. 16 g 2  . furosemide (LASIX) 20 MG tablet TAKE 1 TABLET BY MOUTH DAILY. 30 tablet 5  . lansoprazole (PREVACID) 30 MG capsule TAKE 1 CAPSULE BY MOUTH DAILY AT 12:00 NOON. 90 capsule 1  . meloxicam (MOBIC) 15 MG tablet TAKE 1 TABLET BY MOUTH DAILY. 30 tablet 4  . metFORMIN (GLUCOPHAGE) 500 MG tablet TAKE 1 TABLET BY MOUTH 2 TIMES DAILY WITH A MEAL. 180 tablet 3  . nitroGLYCERIN (NITROSTAT) 0.4 MG SL tablet Place 1 tablet (0.4 mg total) under the tongue every 5 (five) minutes as needed. For chest pain 20 tablet 1  . omeprazole (PRILOSEC) 40 MG capsule TAKE 1 CAPSULE BY MOUTH DAILY. 90 capsule 3  . predniSONE (DELTASONE) 20 MG tablet 3 tabs poqday 1-2, 2 tabs poqday 3-4, 1 tab poqday 5-6 12 tablet 0  . PROAIR HFA 108 (90 BASE) MCG/ACT inhaler INHALE 2 PUFFS INTO THE LUNGS EVERY 4 HOURS AS NEEDED FOR SHORTNESS OF BREATH 8.5 g 2  . topiramate (TOPAMAX) 25 MG tablet TAKE 2 TABLETS BY MOUTH 2 TIMES DAILY. 120 tablet 1  . UNABLE TO FIND Lidocaine ointment 5%     Current Facility-Administered Medications on File Prior to Visit  Medication Dose Route Frequency Provider Last Rate Last Dose  . lactated ringers infusion  Continuous PRN Chyrel Masson, CRNA       No Known Allergies Social History   Socioeconomic History  . Marital status: Divorced    Spouse name: Not on file  . Number of children: 0  . Years of education: Not on file  . Highest education level: Not on file  Occupational History  . Not on file  Social Needs  . Financial resource strain: Not on file  . Food insecurity:    Worry: Not on file    Inability: Not on file  . Transportation needs:    Medical: Not on file    Non-medical: Not on file  Tobacco Use  . Smoking status: Current Every Day Smoker    Packs/day: 1.00    Years: 40.00    Pack years: 40.00    Types: Cigarettes  . Smokeless tobacco: Never Used   . Tobacco comment: 06/05/17 1 pack per 1 1/2 days  Substance and Sexual Activity  . Alcohol use: Yes    Alcohol/week: 0.0 standard drinks    Comment: some  . Drug use: Yes    Types: Cocaine    Comment: patient states none- read lab report 09/02/2012/ 11-20-12 case cx.due to positive screen test-pt. advised no recreational drugs to be used  . Sexual activity: Not on file  Lifestyle  . Physical activity:    Days per week: Not on file    Minutes per session: Not on file  . Stress: Not on file  Relationships  . Social connections:    Talks on phone: Not on file    Gets together: Not on file    Attends religious service: Not on file    Active member of club or organization: Not on file    Attends meetings of clubs or organizations: Not on file    Relationship status: Not on file  . Intimate partner violence:    Fear of current or ex partner: Not on file    Emotionally abused: Not on file    Physically abused: Not on file    Forced sexual activity: Not on file  Other Topics Concern  . Not on file  Social History Narrative   Lives with brother and his sister in [place].       Review of Systems  All other systems reviewed and are negative.      Objective:   Physical Exam  Cardiovascular: Normal rate, regular rhythm and normal heart sounds.  Pulmonary/Chest: Effort normal and breath sounds normal. No respiratory distress. He has no wheezes. He has no rales.  Musculoskeletal:     Left shoulder: He exhibits decreased range of motion, tenderness and pain. He exhibits no bony tenderness, no swelling, no effusion, no crepitus, no spasm and normal strength.  Vitals reviewed.      Assessment:     Subacromial bursitis of left shoulder joint      Plan:     Using sterile technique, I injected the left shoulder with a mixture of 2 mL of lidocaine, 2 mL of Marcaine, and 2 mL of 40 mg per mL Kenalog. Patient tolerated the procedure well without complication.

## 2018-08-21 DIAGNOSIS — G4733 Obstructive sleep apnea (adult) (pediatric): Secondary | ICD-10-CM | POA: Diagnosis not present

## 2018-08-27 ENCOUNTER — Other Ambulatory Visit: Payer: Self-pay | Admitting: Family Medicine

## 2018-08-27 ENCOUNTER — Encounter: Payer: Self-pay | Admitting: Family Medicine

## 2018-08-27 DIAGNOSIS — G4452 New daily persistent headache (NDPH): Secondary | ICD-10-CM

## 2018-10-04 DIAGNOSIS — M25562 Pain in left knee: Secondary | ICD-10-CM | POA: Diagnosis not present

## 2018-10-04 DIAGNOSIS — M1712 Unilateral primary osteoarthritis, left knee: Secondary | ICD-10-CM | POA: Diagnosis not present

## 2018-10-30 DIAGNOSIS — G4733 Obstructive sleep apnea (adult) (pediatric): Secondary | ICD-10-CM | POA: Diagnosis not present

## 2018-11-04 ENCOUNTER — Other Ambulatory Visit: Payer: Self-pay

## 2018-11-04 DIAGNOSIS — Z20822 Contact with and (suspected) exposure to covid-19: Secondary | ICD-10-CM

## 2018-11-06 LAB — NOVEL CORONAVIRUS, NAA: SARS-CoV-2, NAA: NOT DETECTED

## 2018-11-07 ENCOUNTER — Ambulatory Visit: Payer: Medicare Other | Admitting: Family Medicine

## 2018-11-14 ENCOUNTER — Other Ambulatory Visit: Payer: Self-pay

## 2018-11-14 ENCOUNTER — Ambulatory Visit (INDEPENDENT_AMBULATORY_CARE_PROVIDER_SITE_OTHER): Payer: Medicare Other | Admitting: Family Medicine

## 2018-11-14 ENCOUNTER — Encounter: Payer: Self-pay | Admitting: Family Medicine

## 2018-11-14 VITALS — BP 136/80 | HR 78 | Temp 97.8°F | Resp 18 | Ht 66.0 in | Wt 287.0 lb

## 2018-11-14 DIAGNOSIS — S60552A Superficial foreign body of left hand, initial encounter: Secondary | ICD-10-CM | POA: Diagnosis not present

## 2018-11-14 DIAGNOSIS — M722 Plantar fascial fibromatosis: Secondary | ICD-10-CM | POA: Diagnosis not present

## 2018-11-14 DIAGNOSIS — T148XXA Other injury of unspecified body region, initial encounter: Secondary | ICD-10-CM

## 2018-11-14 NOTE — Progress Notes (Signed)
Subjective:    Patient ID: Kenneth [name], male    DOB: 1960/06/25, 58 y.o.   MRN: [id number]  HPI Patient presents today with 2 concerns.  First he has a splinter in his left hand.  The splinter has been there for approximately 1 day.  He got the splinter yesterday while trying to load a lawnmower onto his trailer.  The splinter is located over the radial aspect of the third digit just proximal to the PIP joint on the flexor side.  That area was anesthetized with 0.1% lidocaine with epinephrine.  Pair forceps was inserted into the skin.  The splinter was grasped with a forceps and then removed with traction.  The wound was then probed and no additional splinter felt were seen.  Second concern is pain in his left heel.  The patient has pain over the plantar aspect of his left heel at the insertion of the plantar fascia.  It hurts like someone stabbing him with a needle every time he stands on his foot or tries to walk.  If he takes weight off the foot the heel feels better.  Examination today shows no plantar wart or foreign body or cut or injury.  He is tender to palpation over the plantar fascial insertion on the calcaneus.  He denies any falls or injuries. Past Medical History:  Diagnosis Date  . Arthritis   . Asthma   . Chronic bronchitis ([place])   . GERD (gastroesophageal reflux disease)   . Hypercholesteremia   . Hypertension   . OSA treated with BiPAP    ahi-132, bipap 23/19   Past Surgical History:  Procedure Laterality Date  . ESOPHAGOGASTRODUODENOSCOPY (EGD) WITH PROPOFOL N/A 12/11/2012   Procedure: ESOPHAGOGASTRODUODENOSCOPY (EGD) WITH PROPOFOL;  Surgeon: Arta Silence, MD;  [facility];  Service: Endoscopy;  Laterality: N/A;  . FINGER AMPUTATION    . KNEE ARTHROSCOPY Left 11/15/2015   Procedure: ARTHROSCOPY KNEE AND MEDIAL MENISECTOMY;  Surgeon: Paralee Cancel, MD;  [facility];  Service: Orthopedics;  Laterality: Left;   Current Outpatient Medications on File Prior to  Visit  Medication Sig Dispense Refill  . amitriptyline (ELAVIL) 50 MG tablet Take 1 tablet (50 mg total) by mouth at bedtime. 30 tablet 0  . aspirin 81 MG tablet Take 81 mg by mouth daily.    Marland Kitchen atorvastatin (LIPITOR) 10 MG tablet TAKE 1 TABLET BY MOUTH DAILY. 90 tablet 3  . Blood Glucose Monitoring Suppl (ACCU-CHEK AVIVA PLUS) w/Device KIT Check BS bid 1 kit 1  . Blood Glucose Monitoring Suppl (ONETOUCH VERIO) w/Device KIT     . butalbital-acetaminophen-caffeine (FIORICET) 50-325-40 MG tablet Take 1-2 tablets by mouth every 6 (six) hours as needed for headache. 20 tablet 0  . fluticasone (FLONASE) 50 MCG/ACT nasal spray Place 1 spray into both nostrils daily. 16 g 2  . metFORMIN (GLUCOPHAGE) 500 MG tablet TAKE 1 TABLET BY MOUTH 2 TIMES DAILY WITH A MEAL. 180 tablet 3  . nitroGLYCERIN (NITROSTAT) 0.4 MG SL tablet Place 1 tablet (0.4 mg total) under the tongue every 5 (five) minutes as needed. For chest pain 20 tablet 1  . omeprazole (PRILOSEC) 40 MG capsule TAKE 1 CAPSULE BY MOUTH DAILY. 90 capsule 3  . PROAIR HFA 108 (90 BASE) MCG/ACT inhaler INHALE 2 PUFFS INTO THE LUNGS EVERY 4 HOURS AS NEEDED FOR SHORTNESS OF BREATH 8.5 g 2  . topiramate (TOPAMAX) 25 MG tablet TAKE 2 TABLETS BY MOUTH 2 TIMES DAILY. 120 tablet 1  .  UNABLE TO FIND Lidocaine ointment 5%    . amLODipine (NORVASC) 10 MG tablet Take 1 tablet (10 mg total) by mouth daily. 90 tablet 3   Current Facility-Administered Medications on File Prior to Visit  Medication Dose Route Frequency Provider Last Rate Last Dose  . lactated ringers infusion    Continuous PRN Chyrel Masson, CRNA       No Known Allergies Social History   Socioeconomic History  . Marital status: Divorced    Spouse name: Not on file  . Number of children: 0  . Years of education: Not on file  . Highest education level: Not on file  Occupational History  . Not on file  Social Needs  . Financial resource strain: Not on file  . Food insecurity    Worry:  Not on file    Inability: Not on file  . Transportation needs    Medical: Not on file    Non-medical: Not on file  Tobacco Use  . Smoking status: Current Every Day Smoker    Packs/day: 1.00    Years: 40.00    Pack years: 40.00    Types: Cigarettes  . Smokeless tobacco: Never Used  . Tobacco comment: 06/05/17 1 pack per 1 1/2 days  Substance and Sexual Activity  . Alcohol use: Yes    Alcohol/week: 0.0 standard drinks    Comment: some  . Drug use: Yes    Types: Cocaine    Comment: patient states none- read lab report 09/02/2012/ 11-20-12 case cx.due to positive screen test-pt. advised no recreational drugs to be used  . Sexual activity: Not on file  Lifestyle  . Physical activity    Days per week: Not on file    Minutes per session: Not on file  . Stress: Not on file  Relationships  . Social Herbalist on phone: Not on file    Gets together: Not on file    Attends religious service: Not on file    Active member of club or organization: Not on file    Attends meetings of clubs or organizations: Not on file    Relationship status: Not on file  . Intimate partner violence    Fear of current or ex partner: Not on file    Emotionally abused: Not on file    Physically abused: Not on file    Forced sexual activity: Not on file  Other Topics Concern  . Not on file  Social History Narrative   Lives with brother and his sister in [place].        Review of Systems  All other systems reviewed and are negative.      Objective:   Physical Exam Vitals signs reviewed.  Constitutional:      Appearance: He is obese.  Cardiovascular:     Rate and Rhythm: Normal rate and regular rhythm.  Pulmonary:     Effort: Pulmonary effort is normal.     Breath sounds: Normal breath sounds.  Musculoskeletal:       Hands:     Left foot: Tenderness and bony tenderness present. No swelling, deformity or laceration.       Feet:  Neurological:     Mental Status: He is alert.            Assessment & Plan:  1. Plantar fasciitis of left foot The heel was prepped with Betadine.  The skin was anesthetized with ethyl chloride.  Area diagrammed on the foot above  was injected with 1 cc of lidocaine and 1 cc of 40 mg/mL Kenalog at the point of maximum tenderness near the insertion of the plantar fascia on the calcaneus.  Patient tolerated the procedure well without complication.  2. Splinter in skin Splinter was removed as described in the history of present illness.

## 2018-11-26 ENCOUNTER — Other Ambulatory Visit: Payer: Self-pay | Admitting: Family Medicine

## 2018-11-26 DIAGNOSIS — G4452 New daily persistent headache (NDPH): Secondary | ICD-10-CM

## 2018-11-29 DIAGNOSIS — G4733 Obstructive sleep apnea (adult) (pediatric): Secondary | ICD-10-CM | POA: Diagnosis not present

## 2018-12-16 ENCOUNTER — Ambulatory Visit (INDEPENDENT_AMBULATORY_CARE_PROVIDER_SITE_OTHER): Payer: Medicare Other | Admitting: Adult Health

## 2018-12-16 ENCOUNTER — Other Ambulatory Visit: Payer: Self-pay

## 2018-12-16 ENCOUNTER — Telehealth: Payer: Self-pay

## 2018-12-16 VITALS — BP 120/64 | HR 83 | Temp 98.0°F | Ht 66.0 in | Wt 289.8 lb

## 2018-12-16 DIAGNOSIS — G4733 Obstructive sleep apnea (adult) (pediatric): Secondary | ICD-10-CM | POA: Diagnosis not present

## 2018-12-16 NOTE — Telephone Encounter (Signed)
Pt called to confirm his appointment with NP Ward Givens.

## 2018-12-16 NOTE — Patient Instructions (Signed)
Continue using CPAP nightly and greater than 4 hours each night °If your symptoms worsen or you develop new symptoms please let us know.  ° °

## 2018-12-16 NOTE — Progress Notes (Addendum)
PATIENT: Kenneth [name] DOB: Jun 29, 1960  REASON FOR VISIT: follow up HISTORY FROM: patient  HISTORY OF PRESENT ILLNESS: Today 12/16/18:  [name] is a 57 year old male with a history of obstructive sleep apnea on BiPAP.  He returns today for follow-up.  His download indicates that he uses machine 22 out of 30 days for compliance of 73%.  He uses machine greater than 4 hours 17 days for compliance of 57%.  On average he uses his machine 5 hours and 53 minutes.  His residual AHI is 1.6 on 21/17 centimeters of water.  His leak in the 95th percentile is 25.7 L/min.  There are still some nights that he takes off his mask unknowingly.  Overall he feels that the CPAP is working fairly well for him.  He does notice the mask leaking but reports that he just changed out his mask.  HISTORY 12/12/17:  [name] is a 58 year old male with a history of he returns today for follow-up.  His Bipap machine 54 out of 90 days for compliance of 60%.  He uses machine greater than 4 hours 39 days for compliance of 43%.  On average he uses his machine 5 hours and 10 minutes.  He is on 23/19 centimeters of water.  His residual AHI is 0.9.  His leak in the 90th percentile is 4.4.  He states that he does not always sleep well at night.  Sometimes he has trouble falling asleep and staying asleep therefore he sometimes does not put on the CPAP or he takes it off.  He returns today for evaluation.  REVIEW OF SYSTEMS: Out of a complete 14 system review of symptoms, the patient complains only of the following symptoms, and all other reviewed systems are negative.  ALLERGIES: No Known Allergies  HOME MEDICATIONS: Outpatient Medications Prior to Visit  Medication Sig Dispense Refill  . amitriptyline (ELAVIL) 50 MG tablet Take 1 tablet (50 mg total) by mouth at bedtime. 30 tablet 0  . amLODipine (NORVASC) 10 MG tablet Take 1 tablet (10 mg total) by mouth daily. 90 tablet 3  . aspirin 81 MG tablet Take 81 mg by mouth daily.     Marland Kitchen atorvastatin (LIPITOR) 10 MG tablet TAKE 1 TABLET BY MOUTH DAILY. 90 tablet 3  . Blood Glucose Monitoring Suppl (ACCU-CHEK AVIVA PLUS) w/Device KIT Check BS bid 1 kit 1  . Blood Glucose Monitoring Suppl (ONETOUCH VERIO) w/Device KIT     . butalbital-acetaminophen-caffeine (FIORICET) 50-325-40 MG tablet Take 1-2 tablets by mouth every 6 (six) hours as needed for headache. 20 tablet 0  . fluticasone (FLONASE) 50 MCG/ACT nasal spray Place 1 spray into both nostrils daily. 16 g 2  . metFORMIN (GLUCOPHAGE) 500 MG tablet TAKE 1 TABLET BY MOUTH 2 TIMES DAILY WITH A MEAL. 180 tablet 3  . nitroGLYCERIN (NITROSTAT) 0.4 MG SL tablet Place 1 tablet (0.4 mg total) under the tongue every 5 (five) minutes as needed. For chest pain 20 tablet 1  . omeprazole (PRILOSEC) 40 MG capsule TAKE 1 CAPSULE BY MOUTH DAILY. 90 capsule 3  . PROAIR HFA 108 (90 BASE) MCG/ACT inhaler INHALE 2 PUFFS INTO THE LUNGS EVERY 4 HOURS AS NEEDED FOR SHORTNESS OF BREATH 8.5 g 2  . topiramate (TOPAMAX) 25 MG tablet TAKE 2 TABLETS BY MOUTH 2 TIMES DAILY. 120 tablet 5  . UNABLE TO FIND Lidocaine ointment 5%     Facility-Administered Medications Prior to Visit  Medication Dose Route Frequency Provider Last Rate Last Dose  .  lactated ringers infusion    Continuous PRN Chyrel Masson, CRNA        PAST MEDICAL HISTORY: Past Medical History:  Diagnosis Date  . Arthritis   . Asthma   . Chronic bronchitis ([place])   . GERD (gastroesophageal reflux disease)   . Hypercholesteremia   . Hypertension   . OSA treated with BiPAP    ahi-132, bipap 23/19    PAST SURGICAL HISTORY: Past Surgical History:  Procedure Laterality Date  . ESOPHAGOGASTRODUODENOSCOPY (EGD) WITH PROPOFOL N/A 12/11/2012   Procedure: ESOPHAGOGASTRODUODENOSCOPY (EGD) WITH PROPOFOL;  Surgeon: Arta Silence, MD;  [facility];  Service: Endoscopy;  Laterality: N/A;  . FINGER AMPUTATION    . KNEE ARTHROSCOPY Left 11/15/2015   Procedure: ARTHROSCOPY KNEE  AND MEDIAL MENISECTOMY;  Surgeon: Paralee Cancel, MD;  Location: [facility];  Service: Orthopedics;  Laterality: Left;    FAMILY HISTORY: Family History  Problem Relation Age of Onset  . Hypertension Father   . Diabetes Father   . Diabetes Unknown        Multiple family members  . Diabetes Sister   . Diabetes Brother   . Stroke Sister     SOCIAL HISTORY: Social History   Socioeconomic History  . Marital status: Divorced    Spouse name: Not on file  . Number of children: 0  . Years of education: Not on file  . Highest education level: Not on file  Occupational History  . Not on file  Social Needs  . Financial resource strain: Not on file  . Food insecurity    Worry: Not on file    Inability: Not on file  . Transportation needs    Medical: Not on file    Non-medical: Not on file  Tobacco Use  . Smoking status: Current Every Day Smoker    Packs/day: 1.00    Years: 40.00    Pack years: 40.00    Types: Cigarettes  . Smokeless tobacco: Never Used  . Tobacco comment: 06/05/17 1 pack per 1 1/2 days  Substance and Sexual Activity  . Alcohol use: Yes    Alcohol/week: 0.0 standard drinks    Comment: some  . Drug use: Yes    Types: Cocaine    Comment: patient states none- read lab report 09/02/2012/ 11-20-12 case cx.due to positive screen test-pt. advised no recreational drugs to be used  . Sexual activity: Not on file  Lifestyle  . Physical activity    Days per week: Not on file    Minutes per session: Not on file  . Stress: Not on file  Relationships  . Social Herbalist on phone: Not on file    Gets together: Not on file    Attends religious service: Not on file    Active member of club or organization: Not on file    Attends meetings of clubs or organizations: Not on file    Relationship status: Not on file  . Intimate partner violence    Fear of current or ex partner: Not on file    Emotionally abused: Not on file    Physically abused: Not on file     Forced sexual activity: Not on file  Other Topics Concern  . Not on file  Social History Narrative   Lives with brother and his sister in [place].        PHYSICAL EXAM  Vitals:   12/16/18 1118  BP: 120/64  Pulse: 83  Temp: 98 F (36.7  C)  SpO2: 95%  Weight: 289 lb 12.8 oz (131.5 kg)  Height: 5' 6" (1.676 m)   Body mass index is 46.77 kg/m.  Generalized: Well developed, in no acute distress  Chest: Lungs clear to auscultation bilaterally  Neurological examination  Mentation: Alert oriented to time, place, history taking. Follows all commands speech and language fluent Cranial nerve II-XII: Extraocular movements were full, visual field were full on confrontational test Head turning and shoulder shrug  were normal and symmetric. Motor: The motor testing reveals 5 over 5 strength of all 4 extremities. Good symmetric motor tone is noted throughout.  Sensory: Sensory testing is intact to soft touch on all 4 extremities. No evidence of extinction is noted.  Gait and station: Gait is normal.   DIAGNOSTIC DATA (LABS, IMAGING, TESTING) - I reviewed patient records, labs, notes, testing and imaging myself where available.  Lab Results  Component Value Date   WBC 7.5 02/03/2018   HGB 16.3 02/03/2018   HCT 48.0 02/03/2018   MCV 84.4 02/03/2018   PLT 241 02/03/2018      Component Value Date/Time   NA 141 02/03/2018 1302   K 3.8 02/03/2018 1302   CL 109 02/03/2018 1302   CO2 27 05/22/2017 1433   GLUCOSE 120 (H) 02/03/2018 1302   GLUCOSE 321 (H) 01/28/2016 1144   BUN 19 02/03/2018 1302   CREATININE 0.80 02/03/2018 1302   CREATININE 0.93 05/22/2017 1433   CALCIUM 9.3 05/22/2017 1433   PROT 7.5 05/22/2017 1433   ALBUMIN 4.0 08/25/2016 1039   AST 17 05/22/2017 1433   ALT 20 05/22/2017 1433   ALKPHOS 63 08/25/2016 1039   BILITOT 0.3 05/22/2017 1433   GFRNONAA 91 05/22/2017 1433   GFRAA 105 05/22/2017 1433   Lab Results  Component Value Date   CHOL 184 08/25/2016   HDL 42  08/25/2016   LDLCALC 116 (H) 08/25/2016   TRIG 130 08/25/2016   CHOLHDL 4.4 08/25/2016   Lab Results  Component Value Date   HGBA1C 6.3 (H) 05/22/2017      ASSESSMENT AND PLAN 57 y.o. year old male  has a past medical history of Arthritis, Asthma, Chronic bronchitis ([place]), GERD (gastroesophageal reflux disease), Hypercholesteremia, Hypertension, and OSA treated with BiPAP. here with:  1. Obstructive sleep apnea on CPAP  The patient's CPAP download shows suboptimal compliance but good treatment of his apnea.  He is encouraged to continue using CPAP nightly and greater than 4 hours each night.  He is advised that if his symptoms worsen or he develops new symptoms he should let us know.  He will follow-up in 1 year or sooner if needed   I spent 15 minutes with the patient. 50% of this time was spent reviewing CPAP download   Ward Givens, MSN, NP-C 12/16/2018, 11:28 AM [facility] [address], [place], [place] [address] [phone]  I reviewed the above note and documentation by the Nurse Practitioner and agree with the history, exam, assessment and plan as outlined above. I was immediately available for consultation. Star Age, MD, PhD [facility])

## 2018-12-25 ENCOUNTER — Ambulatory Visit (INDEPENDENT_AMBULATORY_CARE_PROVIDER_SITE_OTHER): Payer: Medicare Other

## 2018-12-25 ENCOUNTER — Other Ambulatory Visit: Payer: Self-pay

## 2018-12-25 DIAGNOSIS — Z23 Encounter for immunization: Secondary | ICD-10-CM

## 2018-12-31 ENCOUNTER — Other Ambulatory Visit: Payer: Self-pay

## 2018-12-31 NOTE — Patient Outreach (Signed)
[facility]) Care Management  12/31/2018  Kenneth [name] [birth year] [id number]   Medication Adherence call to Kenneth [name] HIPPA Compliant Voice message left with a call back number. Kenneth [name] is showing past due on Atorvastatin 10 mg under [place].   [facility] Management Direct Dial [phone]  Fax [phone] [email or web address]

## 2019-01-07 DIAGNOSIS — M25562 Pain in left knee: Secondary | ICD-10-CM | POA: Diagnosis not present

## 2019-01-07 DIAGNOSIS — M1712 Unilateral primary osteoarthritis, left knee: Secondary | ICD-10-CM | POA: Diagnosis not present

## 2019-01-09 ENCOUNTER — Other Ambulatory Visit: Payer: Self-pay | Admitting: Family Medicine

## 2019-01-13 ENCOUNTER — Telehealth: Payer: Self-pay | Admitting: Family Medicine

## 2019-01-13 NOTE — Telephone Encounter (Signed)
Patient needs refill on atorvastatin  [facility]

## 2019-01-14 MED ORDER — ATORVASTATIN CALCIUM 10 MG PO TABS: 10.0000 mg | ORAL_TABLET | Freq: Every day | ORAL | 1 refills | Status: DC

## 2019-01-14 NOTE — Telephone Encounter (Signed)
Medication called/sent to requested pharmacy  

## 2019-01-21 ENCOUNTER — Other Ambulatory Visit: Payer: Self-pay | Admitting: Family Medicine

## 2019-02-19 DIAGNOSIS — G4733 Obstructive sleep apnea (adult) (pediatric): Secondary | ICD-10-CM | POA: Diagnosis not present

## 2019-02-25 ENCOUNTER — Other Ambulatory Visit: Payer: Self-pay

## 2019-02-25 ENCOUNTER — Ambulatory Visit (INDEPENDENT_AMBULATORY_CARE_PROVIDER_SITE_OTHER): Payer: Medicare Other | Admitting: Family Medicine

## 2019-02-25 ENCOUNTER — Encounter: Payer: Self-pay | Admitting: Family Medicine

## 2019-02-25 VITALS — BP 138/82 | HR 82 | Temp 98.2°F | Resp 16 | Ht 66.0 in | Wt 290.0 lb

## 2019-02-25 DIAGNOSIS — E1165 Type 2 diabetes mellitus with hyperglycemia: Secondary | ICD-10-CM

## 2019-02-25 DIAGNOSIS — L03119 Cellulitis of unspecified part of limb: Secondary | ICD-10-CM | POA: Diagnosis not present

## 2019-02-25 DIAGNOSIS — L02619 Cutaneous abscess of unspecified foot: Secondary | ICD-10-CM

## 2019-02-25 DIAGNOSIS — M79672 Pain in left foot: Secondary | ICD-10-CM | POA: Diagnosis not present

## 2019-02-25 DIAGNOSIS — E119 Type 2 diabetes mellitus without complications: Secondary | ICD-10-CM | POA: Insufficient documentation

## 2019-02-25 MED ORDER — SULFAMETHOXAZOLE-TRIMETHOPRIM 800-160 MG PO TABS: 1.0000 | ORAL_TABLET | Freq: Two times a day (BID) | ORAL | 0 refills | Status: DC

## 2019-02-25 MED ORDER — HYDROCODONE-ACETAMINOPHEN 5-325 MG PO TABS: 1.0000 | ORAL_TABLET | Freq: Four times a day (QID) | ORAL | 0 refills | Status: DC | PRN

## 2019-02-25 NOTE — Progress Notes (Signed)
   Subjective:    Patient ID: Kenneth [name], male    DOB: September 09, 1960, 58 y.o.   MRN: [id number]  Patient presents for Foot Pain (x1 week- L foot, back of heel- reports pain when he steps down on it- so edema to rest of foot)   Pt here with left heel pain, he has been taking ibuprofen, has pain when he walks or puts pressure on it, no particular injury  He has had this discomfort for the past week.  He is unable to visualize the bottom of his foot.  He has a crack in the heel other looking back in the chart looks like he had a splinter that was removed out of his left foot back in October.  Sister told him it was red.  He is also has some plantar fasciitis he states it was the other foot however review the chart this is also on the left foot and he had had an injection as well a couple months ago.    No known gout history.     Last A1C 7.4% IN jnue , he has not been checking his sugar, he has been urinating a lot, drinks lots of water , due for repeat A1c   Review Of Systems:  GEN- denies fatigue, fever, weight loss,weakness, recent illness HEENT- denies eye drainage, change in vision, nasal discharge, CVS- denies chest pain, palpitations RESP- denies SOB, cough, wheeze ABD- denies N/V, change in stools, abd pain GU- denies dysuria, hematuria, dribbling, incontinence MSK- + joint pain, muscle aches, injury Neuro- denies headache, dizziness, syncope, seizure activity       Objective:    BP 138/82   Pulse 82   Temp 98.2 F (36.8 C) (Temporal)   Resp 16   Ht 5\' 6"  (1.676 m)   Wt 290 lb (131.5 kg)   SpO2 93%   BMI 46.81 kg/m  GEN- NAD, alert and oriented x3 CVS- RRR, no murmur RESP-CTAB EXT- Trace edema, bilat foot callus, TTP left heel with erythema, crack in heel TTP over crack, no pus expressed, pain with weight bearing, no fluctance Pulses- Radial, DP- 2+        Assessment & Plan:      Problem List Items Addressed This Visit      Unprioritized   DM type 2  (diabetes mellitus, type 2) ([place])    We'll recheck A1c.  He will continue his current medications.  My concern is for cellulitis of the foot he is high risk as he is diabetic.  He has this crack where he has the tenderness and as well as the redness.  However I do not feel a specific abscess to drain today.  We'll have him follow-up on Friday for recheck we'll go ahead and start him on oral antibiotics.  We'll also check a uric acid to rule out gout.  I have given him hydrocodone for pain      Relevant Orders   CBC with Differential (Completed)   Comprehensive metabolic panel (Completed)   Hemoglobin A1c (Completed)    Other Visit Diagnoses    Pain of left heel    -  Primary   Relevant Orders   Uric Acid (Completed)   Cellulitis and abscess of foot          Note: This dictation was prepared with Dragon dictation along with smaller phrase technology. Any transcriptional errors that result from this process are unintentional.

## 2019-02-25 NOTE — Patient Instructions (Addendum)
Take the antibiotics Take the hydrocodone for pain We will discuss labs on Friday Soak foot in warm epson salt 2-3 times a day for the inflammation F/U Friday AM for recheck with [facility] or [facility]

## 2019-02-26 ENCOUNTER — Encounter: Payer: Self-pay | Admitting: Family Medicine

## 2019-02-26 LAB — COMPREHENSIVE METABOLIC PANEL
AG Ratio: 1.1 (calc) (ref 1.0–2.5)
ALT: 19 U/L (ref 9–46)
AST: 17 U/L (ref 10–35)
Albumin: 4.1 g/dL (ref 3.6–5.1)
Alkaline phosphatase (APISO): 69 U/L (ref 35–144)
BUN: 12 mg/dL (ref 7–25)
CO2: 23 mmol/L (ref 20–32)
Calcium: 9.2 mg/dL (ref 8.6–10.3)
Chloride: 108 mmol/L (ref 98–110)
Creat: 1.03 mg/dL (ref 0.70–1.33)
Globulin: 3.8 g/dL (calc) — ABNORMAL HIGH (ref 1.9–3.7)
Glucose, Bld: 102 mg/dL — ABNORMAL HIGH (ref 65–99)
Potassium: 4.3 mmol/L (ref 3.5–5.3)
Sodium: 141 mmol/L (ref 135–146)
Total Bilirubin: 0.2 mg/dL (ref 0.2–1.2)
Total Protein: 7.9 g/dL (ref 6.1–8.1)

## 2019-02-26 LAB — HEMOGLOBIN A1C
Hgb A1c MFr Bld: 6.5 % of total Hgb — ABNORMAL HIGH (ref <5.7)
Mean Plasma Glucose: 140 (calc)
eAG (mmol/L): 7.7 (calc)

## 2019-02-26 LAB — URIC ACID: Uric Acid, Serum: 4.9 mg/dL (ref 4.0–8.0)

## 2019-02-26 LAB — CBC WITH DIFFERENTIAL/PLATELET
Absolute Monocytes: 681 cells/uL (ref 200–950)
Basophils Absolute: 74 cells/uL (ref 0–200)
Basophils Relative: 1 %
Eosinophils Absolute: 111 cells/uL (ref 15–500)
Eosinophils Relative: 1.5 %
HCT: 45.5 % (ref 38.5–50.0)
Hemoglobin: 14.5 g/dL (ref 13.2–17.1)
Lymphs Abs: 3197 cells/uL (ref 850–3900)
MCH: 26.6 pg — ABNORMAL LOW (ref 27.0–33.0)
MCHC: 31.9 g/dL — ABNORMAL LOW (ref 32.0–36.0)
MCV: 83.3 fL (ref 80.0–100.0)
MPV: 11.7 fL (ref 7.5–12.5)
Monocytes Relative: 9.2 %
Neutro Abs: 3337 cells/uL (ref 1500–7800)
Neutrophils Relative %: 45.1 %
Platelets: 300 10*3/uL (ref 140–400)
RBC: 5.46 10*6/uL (ref 4.20–5.80)
RDW: 13.4 % (ref 11.0–15.0)
Total Lymphocyte: 43.2 %
WBC: 7.4 10*3/uL (ref 3.8–10.8)

## 2019-02-26 NOTE — Assessment & Plan Note (Signed)
We'll recheck A1c.  He will continue his current medications.  My concern is for cellulitis of the foot he is high risk as he is diabetic.  He has this crack where he has the tenderness and as well as the redness.  However I do not feel a specific abscess to drain today.  We'll have him follow-up on Friday for recheck we'll go ahead and start him on oral antibiotics.  We'll also check a uric acid to rule out gout.  I have given him hydrocodone for pain

## 2019-02-28 ENCOUNTER — Encounter: Payer: Self-pay | Admitting: Family Medicine

## 2019-02-28 ENCOUNTER — Other Ambulatory Visit: Payer: Self-pay

## 2019-02-28 ENCOUNTER — Ambulatory Visit (INDEPENDENT_AMBULATORY_CARE_PROVIDER_SITE_OTHER): Payer: Medicare Other | Admitting: Family Medicine

## 2019-02-28 DIAGNOSIS — M79672 Pain in left foot: Secondary | ICD-10-CM | POA: Diagnosis not present

## 2019-02-28 MED ORDER — MELOXICAM 15 MG PO TABS: 15.0000 mg | ORAL_TABLET | Freq: Every day | ORAL | 0 refills | Status: DC

## 2019-02-28 NOTE — Progress Notes (Signed)
   Subjective:    Patient ID: Kenneth [name], male    DOB: September 09, 1960, 58 y.o.   MRN: [id number]  Patient presents for Foot Pain  Pt here for intermin f/u on left foot pain, My differentials were Gout vs cellulitis, he had previous splinter removed from this region and steroid injection though that was in August, He had crack in the skin with erythema and point tenderness. Symptoms were not consistent with plantar fascitis.  Labs showed normal WBC, Uric Acid  A1C improved at 6.5%- showing good control of diabetes  He was prescribed norco, Bactrim to cover cellulitis/infection , erythema has gone done, but pain unchanged except with pain meds Uric acid level returned normal- so likley not gout      Review Of Systems:  GEN- denies fatigue, fever, weight loss,weakness, recent illness HEENT- denies eye drainage, change in vision, nasal discharge, CVS- denies chest pain, palpitations RESP- denies SOB, cough, wheeze MSK- + joint pain, muscle aches, injury Neuro- denies headache, dizziness, syncope, seizure activity       Objective:    BP 114/60   Pulse 88   Temp 98.4 F (36.9 C) (Oral)   Resp 16   Ht 5\' 6"  (1.676 m)   Wt 288 lb 2 oz (130.7 kg)   SpO2 95%   BMI 46.50 kg/m  GEN- NAD, alert and oriented x3 EXT- No edema, bilat foot callus, TTP left heel wi, crack in heel TTP over crack, no pus expressed, pain with weight bearing, no fluctuance FROM ankle  Pulses- Radial, DP- 2+         Assessment & Plan:      Problem List Items Addressed This Visit    None    Visit Diagnoses    Pain of left heel       Erythema improved over crack in skin, but still has inflammation and pain, no discrete abscess, will refer to podiatry, Possible foreign body, or heel spur?? Complete antibiotics Continue epson salt soaks, add mobic for inflammation Earliest podiatry appt Tuesday Would recommend xray of foot    Relevant Orders   Ambulatory referral to Podiatry      Note: This  dictation was prepared with Dragon dictation along with smaller phrase technology. Any transcriptional errors that result from this process are unintentional.

## 2019-02-28 NOTE — Patient Instructions (Addendum)
[facility]- Dr. Hyatt   [address]  Tuesday 12/8 at  [place] taking antibiotics,keep soaking Use pain medication Elevate foot  Take meloxicam 15mg  once a day

## 2019-03-04 ENCOUNTER — Ambulatory Visit: Payer: Medicare Other | Admitting: Podiatry

## 2019-03-04 ENCOUNTER — Other Ambulatory Visit: Payer: Self-pay

## 2019-03-06 ENCOUNTER — Ambulatory Visit (INDEPENDENT_AMBULATORY_CARE_PROVIDER_SITE_OTHER): Payer: Medicare Other

## 2019-03-06 ENCOUNTER — Other Ambulatory Visit: Payer: Self-pay

## 2019-03-06 ENCOUNTER — Ambulatory Visit (INDEPENDENT_AMBULATORY_CARE_PROVIDER_SITE_OTHER): Payer: Medicare Other | Admitting: Podiatry

## 2019-03-06 ENCOUNTER — Telehealth: Payer: Self-pay | Admitting: *Deleted

## 2019-03-06 ENCOUNTER — Encounter: Payer: Self-pay | Admitting: Podiatry

## 2019-03-06 ENCOUNTER — Encounter

## 2019-03-06 DIAGNOSIS — E119 Type 2 diabetes mellitus without complications: Secondary | ICD-10-CM | POA: Diagnosis not present

## 2019-03-06 DIAGNOSIS — M79672 Pain in left foot: Secondary | ICD-10-CM

## 2019-03-06 DIAGNOSIS — R234 Changes in skin texture: Secondary | ICD-10-CM | POA: Diagnosis not present

## 2019-03-06 DIAGNOSIS — M722 Plantar fascial fibromatosis: Secondary | ICD-10-CM | POA: Diagnosis not present

## 2019-03-06 MED ORDER — MUPIROCIN 2 % EX OINT: 1.0000 "application " | TOPICAL_OINTMENT | Freq: Two times a day (BID) | CUTANEOUS | 2 refills | Status: DC

## 2019-03-06 MED ORDER — UREA 40 % EX CREA: 1.0000 "application " | TOPICAL_CREAM | Freq: Every day | CUTANEOUS | 1 refills | Status: DC

## 2019-03-06 NOTE — Patient Instructions (Signed)
Start with the antibiotic ointment. Once the wound has healed I want you to switch to using the urea cream. Monitor for any redness, drainage, swelling or any other signs of infection. If there are any issues please give me a call at [phone]

## 2019-03-07 ENCOUNTER — Telehealth: Payer: Self-pay | Admitting: Podiatry

## 2019-03-07 NOTE — Telephone Encounter (Signed)
Unable to contact pt's sister on the listed phone number to inform pt could pick up Revitaderm40 in our office for around $22.00. I informed schedulers that if pt's sister called to inform of the Revitaderm40 cream.

## 2019-03-07 NOTE — Telephone Encounter (Signed)
Pt sister mary  called to say that cream Dr.Wagoner prescribed is 95.00 dollarsis there anything that he can get that his ins will cover. [facility] [address] [employer]

## 2019-03-07 NOTE — Telephone Encounter (Signed)
Unable to contact caller after multiple attempts and phone line busy.

## 2019-03-11 ENCOUNTER — Other Ambulatory Visit: Payer: Self-pay | Admitting: Family Medicine

## 2019-03-12 ENCOUNTER — Ambulatory Visit: Payer: Medicare Other | Admitting: Family Medicine

## 2019-03-17 NOTE — Progress Notes (Signed)
Subjective:   Patient ID: Kenneth [name], male   DOB: 58 y.o.   MRN: [id number]   HPI 58 year old male presents the office today for concerns of pain to the bottom of his left heel over the area of the cracked, dry skin.  There has been on the past 3 weeks with gradual onset.  No recent injury to his foot.  The area does hurt with pressure.  Denies any swelling or redness or any drainage.  No bleeding.  He is diabetic and last A1c was 6.5.  No injury.  Does not recall stepping on any foreign objects.  No other concerns today.   Review of Systems  All other systems reviewed and are negative.  Past Medical History:  Diagnosis Date  . Arthritis   . Asthma   . Chronic bronchitis ([place])   . GERD (gastroesophageal reflux disease)   . Hypercholesteremia   . Hypertension   . OSA treated with BiPAP    ahi-132, bipap 23/19    Past Surgical History:  Procedure Laterality Date  . ESOPHAGOGASTRODUODENOSCOPY (EGD) WITH PROPOFOL N/A 12/11/2012   Procedure: ESOPHAGOGASTRODUODENOSCOPY (EGD) WITH PROPOFOL;  Surgeon: Arta Silence, MD;  Location: [facility];  Service: Endoscopy;  Laterality: N/A;  . FINGER AMPUTATION    . KNEE ARTHROSCOPY Left 11/15/2015   Procedure: ARTHROSCOPY KNEE AND MEDIAL MENISECTOMY;  Surgeon: Paralee Cancel, MD;  Location: [facility];  Service: Orthopedics;  Laterality: Left;     Current Outpatient Medications:  .  amitriptyline (ELAVIL) 50 MG tablet, Take 1 tablet (50 mg total) by mouth at bedtime., Disp: 30 tablet, Rfl: 0 .  amLODipine (NORVASC) 10 MG tablet, TAKE 1 TABLET BY MOUTH DAILY., Disp: 90 tablet, Rfl: 3 .  aspirin 81 MG tablet, Take 81 mg by mouth daily., Disp: , Rfl:  .  atorvastatin (LIPITOR) 10 MG tablet, Take 1 tablet (10 mg total) by mouth daily., Disp: 90 tablet, Rfl: 1 .  Blood Glucose Monitoring Suppl (ACCU-CHEK AVIVA PLUS) w/Device KIT, Check BS bid, Disp: 1 kit, Rfl: 1 .  Blood Glucose Monitoring Suppl (ONETOUCH VERIO) w/Device KIT, , Disp: , Rfl:  .   butalbital-acetaminophen-caffeine (FIORICET) 50-325-40 MG tablet, Take 1-2 tablets by mouth every 6 (six) hours as needed for headache., Disp: 20 tablet, Rfl: 0 .  fluticasone (FLONASE) 50 MCG/ACT nasal spray, Place 1 spray into both nostrils daily., Disp: 16 g, Rfl: 2 .  HYDROcodone-acetaminophen (NORCO) 5-325 MG tablet, Take 1 tablet by mouth every 6 (six) hours as needed for moderate pain., Disp: 20 tablet, Rfl: 0 .  meloxicam (MOBIC) 15 MG tablet, Take 1 tablet (15 mg total) by mouth daily., Disp: 30 tablet, Rfl: 0 .  metFORMIN (GLUCOPHAGE) 500 MG tablet, TAKE 1 TABLET BY MOUTH 2 TIMES DAILY WITH A MEAL., Disp: 180 tablet, Rfl: 3 .  nitroGLYCERIN (NITROSTAT) 0.4 MG SL tablet, Place 1 tablet (0.4 mg total) under the tongue every 5 (five) minutes as needed. For chest pain, Disp: 20 tablet, Rfl: 1 .  PROAIR HFA 108 (90 BASE) MCG/ACT inhaler, INHALE 2 PUFFS INTO THE LUNGS EVERY 4 HOURS AS NEEDED FOR SHORTNESS OF BREATH, Disp: 8.5 g, Rfl: 2 .  sulfamethoxazole-trimethoprim (BACTRIM DS) 800-160 MG tablet, Take 1 tablet by mouth 2 (two) times daily., Disp: 14 tablet, Rfl: 0 .  topiramate (TOPAMAX) 25 MG tablet, TAKE 2 TABLETS BY MOUTH 2 TIMES DAILY., Disp: 120 tablet, Rfl: 5 .  UNABLE TO FIND, Lidocaine ointment 5%, Disp: , Rfl:  .  mupirocin ointment (BACTROBAN)  2 %, Apply 1 application topically 2 (two) times daily., Disp: 30 g, Rfl: 2 .  omeprazole (PRILOSEC) 40 MG capsule, TAKE 1 CAPSULE BY MOUTH DAILY., Disp: 90 capsule, Rfl: 3 .  urea (CARMOL) 40 % CREA, Apply 1 application topically daily., Disp: 85 g, Rfl: 1 No current facility-administered medications for this visit.  Facility-Administered Medications Ordered in Other Visits:  .  lactated ringers infusion, , , Continuous PRN, Chyrel Masson, CRNA, New Bag at 11/15/15 1545  No Known Allergies       Objective:  Physical Exam  General: AAO x3, NAD  Dermatological: On the plantar aspect left heel is a skin fissure with tenderness  palpation.  Superficial granular wound is present.  There is no surrounding erythema, ascending cellulitis.  There is no fluctuation or crepitation there is no malodor.  No other open lesions.  Vascular: Dorsalis Pedis artery and Posterior Tibial artery pedal pulses are 2/4 bilateral with immedate capillary fill time. There is no pain with calf compression, swelling, warmth, erythema.   Neruologic: Grossly intact via light touch bilateral.   Musculoskeletal: Tenderness palpation directly to the skin fissure pain over the area of tenderness.  No pain with lateral compression of calcaneus.  Achilles tendon appears to be intact.  Muscular strength 5/5 in all groups tested bilateral.  Gait: Unassisted, Nonantalgic.       Assessment:   Skin fissure left heel, pain    Plan:  -Treatment options discussed including all alternatives, risks, and complications -Etiology of symptoms were discussed -X-rays obtained reviewed.  No evidence of acute fracture or foreign body. -I lightly debrided the hyperkeratotic tissue, skin fissure without any complications or bleeding.  Prescribed Pearson ointment.  Once the wound is healed he can switch to using urea cream. -Monitor for any clinical signs or symptoms of infection and directed to call the office immediately should any occur or go to the ER.  Return in about 2 weeks (around 03/20/2019).  Trula Slade DPM

## 2019-03-24 ENCOUNTER — Ambulatory Visit: Payer: Medicare Other | Admitting: Podiatry

## 2019-03-26 DIAGNOSIS — G4733 Obstructive sleep apnea (adult) (pediatric): Secondary | ICD-10-CM | POA: Diagnosis not present

## 2019-05-14 ENCOUNTER — Telehealth: Payer: Self-pay | Admitting: Adult Health

## 2019-05-14 DIAGNOSIS — G4733 Obstructive sleep apnea (adult) (pediatric): Secondary | ICD-10-CM

## 2019-05-14 NOTE — Telephone Encounter (Signed)
Patient sister in law states the patient needs a new machine and [facility] is needing MD to contact them stating he is able to get his machine march 4th

## 2019-05-14 NOTE — Telephone Encounter (Signed)
Pt last seen 12-16-18.  See annually.  Pt would like to get new bipap machine.  [facility] relayed needed to contact us.  Next appt 11/2019.  Please advise.

## 2019-05-19 NOTE — Telephone Encounter (Signed)
Order placed

## 2019-05-20 NOTE — Telephone Encounter (Signed)
I called sister in law, Stanton Kidney and relayed that new order for new machine placed in Epic.  Will message [facility] to let them know.  Wanted to let her know as well.  CM to [facility]/received.

## 2019-05-22 DIAGNOSIS — G4733 Obstructive sleep apnea (adult) (pediatric): Secondary | ICD-10-CM | POA: Diagnosis not present

## 2019-05-24 DIAGNOSIS — M1712 Unilateral primary osteoarthritis, left knee: Secondary | ICD-10-CM | POA: Diagnosis not present

## 2019-05-24 DIAGNOSIS — M1711 Unilateral primary osteoarthritis, right knee: Secondary | ICD-10-CM | POA: Diagnosis not present

## 2019-05-25 ENCOUNTER — Ambulatory Visit: Payer: Medicare Other | Attending: Internal Medicine

## 2019-05-25 DIAGNOSIS — Z23 Encounter for immunization: Secondary | ICD-10-CM

## 2019-05-25 NOTE — Progress Notes (Signed)
   Covid-19 Vaccination Clinic  Name:  Kenneth [name]    MRN: [id number] DOB: [birth year]  05/25/2019  [name] was observed post Covid-19 immunization for 15 minutes without incidence. He was provided with Vaccine Information Sheet and instruction to access the V-Safe system.   [name] was instructed to call 911 with any severe reactions post vaccine: Marland Kitchen Difficulty breathing  . Swelling of your face and throat  . A fast heartbeat  . A bad rash all over your body  . Dizziness and weakness    Immunizations Administered    Name Date Dose VIS Date Route   Moderna COVID-19 Vaccine 05/25/2019 12:57 PM 0.5 mL 02/25/2019 Intramuscular   Manufacturer: Moderna   Lot: [id number]   [id number]

## 2019-05-29 ENCOUNTER — Telehealth: Payer: Self-pay | Admitting: *Deleted

## 2019-05-29 NOTE — Telephone Encounter (Signed)
Received call from patient sister, Stanton Kidney.   Reports that patient fasting CBG noted at 190 this morning. Patient took MTF 500mg  with breakfast.   States that prior to lunch, patient CBG noted at 280. Reports that she gave patient Novolog 10u. Reports that patient did eat lunch and is drinking water.   Advised that no further insulin should be given. Advised to monitor for hypoglycemia D/T insulin.   States that patient was seen by insurance nurse for house call on 05/28/2019. Reports that A1C noted at 7.1%.  MD made aware and advised appointment required.   Appointment scheduled.

## 2019-05-30 ENCOUNTER — Ambulatory Visit: Payer: Self-pay | Admitting: Family Medicine

## 2019-06-02 ENCOUNTER — Encounter: Payer: Self-pay | Admitting: Family Medicine

## 2019-06-02 ENCOUNTER — Ambulatory Visit (INDEPENDENT_AMBULATORY_CARE_PROVIDER_SITE_OTHER): Payer: Medicare Other | Admitting: Family Medicine

## 2019-06-02 ENCOUNTER — Other Ambulatory Visit: Payer: Self-pay

## 2019-06-02 VITALS — BP 126/82 | HR 88 | Temp 97.9°F | Resp 16 | Ht 66.0 in | Wt 292.0 lb

## 2019-06-02 DIAGNOSIS — E1165 Type 2 diabetes mellitus with hyperglycemia: Secondary | ICD-10-CM | POA: Diagnosis not present

## 2019-06-02 DIAGNOSIS — R519 Headache, unspecified: Secondary | ICD-10-CM | POA: Diagnosis not present

## 2019-06-02 DIAGNOSIS — Z125 Encounter for screening for malignant neoplasm of prostate: Secondary | ICD-10-CM | POA: Diagnosis not present

## 2019-06-02 MED ORDER — AIMOVIG 70 MG/ML ~~LOC~~ SOAJ: 70.0000 mg | SUBCUTANEOUS | 3 refills | Status: DC

## 2019-06-02 MED ORDER — ALBUTEROL SULFATE HFA 108 (90 BASE) MCG/ACT IN AERS: INHALATION_SPRAY | RESPIRATORY_TRACT | 2 refills | Status: DC

## 2019-06-02 NOTE — Progress Notes (Signed)
Subjective:     Patient ID: Kenneth [name], male   DOB: 1961/01/20, 59 y.o.   MRN: [id number]  Wound Check    02/05/18 Patient has a history of chronic daily headache suspected due to migraine.  This was treated in the past with daily Topamax.  Since taking Topamax, his headaches have subsided.  Recently his headache has returned.  This occurred approximately 1 week ago.  Patient admits to drinking heavily 1 week ago.  He was doing this for several days.  He missed his Topamax for several days.  He started getting a frontal headache.  The pain is constant.  In the afternoons the pain worsens.  There is no photophobia or phonophobia.  It is a constant pressure-like pain.  He has resumed his Topamax however the headache is not yet gone away.  It feels and sounds more like a tension headache.  Exacerbating factors are elevated blood pressures.  He has been checking his blood pressure at home and his blood pressures have been averaging between 130 and 168/80-90.  In fact, one day he saw his blood pressure as high as 194 systolic.  He denies any chest pain or shortness of breath however he went to the emergency room.  In the emergency room, his headache improved with Toradol.  A head CT was negative for any acute intracranial process.  He denies any falls or concussions while drinking to the best of his knowledge.  He denies any neurologic deficit.  He also reports a one-week history of heel pain.  On examination of the posterior aspect of his calcaneus is a vertical skin laceration.  It is 3 cm long.  It is a sharp "paper cut" like laceration.  There is thick hyperkeratotic scar forming around it preventing it from healing.  Whenever he steps, his body weight splits the fissure in the skin causing heel pain.  At that time, my plan was: I believe his headache is likely should have his underlying headache disorder which I believe is migraine in nature versus chronic tension headache.  I believe this occurred due to  the discontinuation of his Topamax and his heavy drinking.  He is now abstaining from alcohol and has resumed his Topamax.  I will treat the patient similar to status migrainosus with a prednisone taper pack.  We will also treat his blood pressure with amlodipine 10 mg a day.  The lesion on his heel, I treated by debriding away the thick hyperkeratotic tissue from both edges of the laceration.  I then coated the area with Silvadene and dressed with a nonadherent gauze and Coban.  Recheck the patient and redress the wound in 48 hours and continue this until healed.  02/07/18 The wound looks much better today.  The edges are slowly coming together.  The hyperkeratotic tissue has been removed and the skin is much more supple having been dressed and bandaged appropriately for the last 48 hours.  There is no erythema or exudate or evidence of cellulitis.  The patient's headache has completely resolved on prednisone.  His blood pressure is much better at 120/76.  Overall he is feeling much better.  At that time, my plan was: We discussed wound care.  I recommended daily dressing changes to the lesion on his right heel.  He will apply a copious amount of Polysporin to the wound, cover with a nonadherent gauze and then wrapping Coban.  He will perform daily dressing changes over the weekend and then we will  recheck on Monday.  I anticipate the wound should heal by them.  His heel pain has since resolved now that the wound is looking better.  His headaches have gone away.  His blood pressure is much better controlled.  Therefore I recommend he continue to complete the prednisone taper back and continue to take the amlodipine.  02/28/18 Patient called back shortly thereafter and stated the headaches had returned just as severe as before despite the fact he was not drinking alcohol and despite the fact he was wearing his CPAP machine.  We started the patient on Fioricet to be used as needed for breakthrough headaches.   He states this medication helps very little.  He states he has the headaches every day all day.  He reports the headaches are in a bandlike fashion in the front of his forehead just above his eyes.  He describes it as a constant nagging pressure-like pain that will not go away.  He denies any photophobia.  He denies any phonophobia.  He denies any nausea or vomiting.  Headaches are not unilateral.  There is no rhinorrhea associated with them.  There is no unilateral lacrimation.  There is no blurry vision or vision changes.  Headaches sound more like chronic tension headaches.  Still taking Topamax 50 mg twice daily.  At that time, my plan was: Headaches sound like a chronic tension headache.  He has tried and failed Topamax.  He is tried and failed prednisone.  He is tried and failed Fioricet.  We discussed options today including adding a tricyclic antidepressant as prevention for chronic tension headaches, trying Aimovig for possible atypical migraines, or a referral to a headache specialist for possible Botox injections.  After long discussion he would like to try amitriptyline 50 mg p.o. nightly.  If headaches are no better in 2 weeks, I would recommend a consultation with neurology.  Patient is comfortable with this plan.  06/02/19 Has OSA managed with BiPAP 23/19.  He states that he has been compliant with wearing his BiPAP machine although he is supposed to get a new machine this Wednesday due to a malfunction in his current machine.  However he states that his headaches have been present even before his machine started malfunctioning.  He also notes that he was using marijuana and cocaine over the weekend however he states that the headaches were present prior to using the drugs.  He does not believe that he ever started taking the amitriptyline as prescribed last time.  The headaches are constant.  They occur on a daily basis.  They are in a bandlike pattern in his forehead.  Previously I felt patient  was having chronic tension headaches however I do believe there is an element of chronic daily headache that may be atypical migraine obviously complicated by his drug abuse and noncompliance and obstructive sleep apnea.  He has never tried Aimovig.  Migraines do run in his family.  He states that his brother has migraines.  He does not know about his extended family.  His father killed his mother and he does not associate with his father.  Last PSA was 0.5 in 2018.  Patient is due to recheck a PSA for prostate cancer screening.  He is also due to recheck hemoglobin A1c.  His last hemoglobin A1c was 6.5 in December. Past Medical History:  Diagnosis Date  . Arthritis   . Asthma   . Chronic bronchitis ([place])   . GERD (gastroesophageal reflux disease)   .  Hypercholesteremia   . Hypertension   . OSA treated with BiPAP    ahi-132, bipap 23/19   Past Surgical History:  Procedure Laterality Date  . ESOPHAGOGASTRODUODENOSCOPY (EGD) WITH PROPOFOL N/A 12/11/2012   Procedure: ESOPHAGOGASTRODUODENOSCOPY (EGD) WITH PROPOFOL;  Surgeon: Arta Silence, MD;  Location: [facility];  Service: Endoscopy;  Laterality: N/A;  . FINGER AMPUTATION    . KNEE ARTHROSCOPY Left 11/15/2015   Procedure: ARTHROSCOPY KNEE AND MEDIAL MENISECTOMY;  Surgeon: Paralee Cancel, MD;  Location: [facility];  Service: Orthopedics;  Laterality: Left;   Current Outpatient Medications on File Prior to Visit  Medication Sig Dispense Refill  . amitriptyline (ELAVIL) 50 MG tablet Take 1 tablet (50 mg total) by mouth at bedtime. 30 tablet 0  . amLODipine (NORVASC) 10 MG tablet TAKE 1 TABLET BY MOUTH DAILY. 90 tablet 3  . aspirin 81 MG tablet Take 81 mg by mouth daily.    Marland Kitchen atorvastatin (LIPITOR) 10 MG tablet Take 1 tablet (10 mg total) by mouth daily. 90 tablet 1  . Blood Glucose Monitoring Suppl (ACCU-CHEK AVIVA PLUS) w/Device KIT Check BS bid 1 kit 1  . Blood Glucose Monitoring Suppl (ONETOUCH VERIO) w/Device KIT     .  butalbital-acetaminophen-caffeine (FIORICET) 50-325-40 MG tablet Take 1-2 tablets by mouth every 6 (six) hours as needed for headache. 20 tablet 0  . fluticasone (FLONASE) 50 MCG/ACT nasal spray Place 1 spray into both nostrils daily. 16 g 2  . HYDROcodone-acetaminophen (NORCO) 5-325 MG tablet Take 1 tablet by mouth every 6 (six) hours as needed for moderate pain. 20 tablet 0  . meloxicam (MOBIC) 15 MG tablet Take 1 tablet (15 mg total) by mouth daily. 30 tablet 0  . metFORMIN (GLUCOPHAGE) 500 MG tablet TAKE 1 TABLET BY MOUTH 2 TIMES DAILY WITH A MEAL. 180 tablet 3  . mupirocin ointment (BACTROBAN) 2 % Apply 1 application topically 2 (two) times daily. 30 g 2  . nitroGLYCERIN (NITROSTAT) 0.4 MG SL tablet Place 1 tablet (0.4 mg total) under the tongue every 5 (five) minutes as needed. For chest pain 20 tablet 1  . omeprazole (PRILOSEC) 40 MG capsule TAKE 1 CAPSULE BY MOUTH DAILY. 90 capsule 3  . PROAIR HFA 108 (90 BASE) MCG/ACT inhaler INHALE 2 PUFFS INTO THE LUNGS EVERY 4 HOURS AS NEEDED FOR SHORTNESS OF BREATH 8.5 g 2  . sulfamethoxazole-trimethoprim (BACTRIM DS) 800-160 MG tablet Take 1 tablet by mouth 2 (two) times daily. 14 tablet 0  . topiramate (TOPAMAX) 25 MG tablet TAKE 2 TABLETS BY MOUTH 2 TIMES DAILY. 120 tablet 5  . UNABLE TO FIND Lidocaine ointment 5%    . urea (CARMOL) 40 % CREA Apply 1 application topically daily. 85 g 1   Current Facility-Administered Medications on File Prior to Visit  Medication Dose Route Frequency Provider Last Rate Last Admin  . lactated ringers infusion    Continuous PRN Chyrel Masson, CRNA   New Bag at 11/15/15 1545   No Known Allergies Social History   Socioeconomic History  . Marital status: Divorced    Spouse name: Not on file  . Number of children: 0  . Years of education: Not on file  . Highest education level: Not on file  Occupational History  . Not on file  Tobacco Use  . Smoking status: Current Every Day Smoker    Packs/day: 1.00     Years: 40.00    Pack years: 40.00    Types: Cigarettes  . Smokeless tobacco: Never Used  .  Tobacco comment: 06/05/17 1 pack per 1 1/2 days  Substance and Sexual Activity  . Alcohol use: Yes    Alcohol/week: 0.0 standard drinks    Comment: some  . Drug use: Yes    Types: Cocaine    Comment: patient states none- read lab report 09/02/2012/ 11-20-12 case cx.due to positive screen test-pt. advised no recreational drugs to be used  . Sexual activity: Not on file  Other Topics Concern  . Not on file  Social History Narrative   Lives with brother and his sister in [employer] [place].     Social Determinants of Health   Financial Resource Strain:   . Difficulty of Paying Living Expenses: Not on file  Food Insecurity:   . Worried About Charity fundraiser in the Last Year: Not on file  . Ran Out of Food in the Last Year: Not on file  Transportation Needs:   . Lack of Transportation (Medical): Not on file  . Lack of Transportation (Non-Medical): Not on file  Physical Activity:   . Days of Exercise per Week: Not on file  . Minutes of Exercise per Session: Not on file  Stress:   . Feeling of Stress : Not on file  Social Connections:   . Frequency of Communication with Friends and Family: Not on file  . Frequency of Social Gatherings with Friends and Family: Not on file  . Attends Religious Services: Not on file  . Active Member of Clubs or Organizations: Not on file  . Attends Archivist Meetings: Not on file  . Marital Status: Not on file  Intimate Partner Violence:   . Fear of Current or Ex-Partner: Not on file  . Emotionally Abused: Not on file  . Physically Abused: Not on file  . Sexually Abused: Not on file     Review of Systems  All other systems reviewed and are negative.      Objective:   Physical Exam  Constitutional: He is oriented to person, place, and time.  Eyes: Pupils are equal, round, and reactive to light. EOM are normal.  Cardiovascular: Normal rate,  regular rhythm and normal heart sounds.  Pulmonary/Chest: Effort normal and breath sounds normal. No respiratory distress. He has no wheezes. He has no rales.  Neurological: He is alert and oriented to person, place, and time. He has normal reflexes. No cranial nerve deficit. He exhibits normal muscle tone. Coordination normal.  Vitals reviewed.      Assessment:     Type 2 diabetes mellitus with hyperglycemia, without long-term current use of insulin (HCC) - Plan: PSA, Hemoglobin A1c, COMPLETE METABOLIC PANEL WITH GFR, CBC with Differential/Platelet, Lipid panel  Chronic daily headache  Prostate cancer screening I will check a hemoglobin A1c to monitor the management of the patient's diabetes.  Meanwhile I will also check a fasting lipid panel and a CMP.  Ideally I like his LDL cholesterol to be below 100.  Patient is not on an ACE inhibitor and would benefit from an ACE inhibitor for renal protection.  If his creatinine will tolerate it, I will likely switch his amlodipine to an ACE inhibitor.  I believe his headaches are likely a combination of migraines, drug abuse, obstructive sleep apnea etc.  I will discontinue amitriptyline given the fact the patient never took it and start him on Aimovig 70 mg subcu monthly to see if we can reduce the frequency of his headaches.  I will also screen for prostate cancer with PSA.  Plan:

## 2019-06-03 ENCOUNTER — Other Ambulatory Visit: Payer: Self-pay | Admitting: Family Medicine

## 2019-06-03 LAB — COMPLETE METABOLIC PANEL WITH GFR
AG Ratio: 1.3 (calc) (ref 1.0–2.5)
ALT: 18 U/L (ref 9–46)
AST: 15 U/L (ref 10–35)
Albumin: 3.9 g/dL (ref 3.6–5.1)
Alkaline phosphatase (APISO): 71 U/L (ref 35–144)
BUN: 19 mg/dL (ref 7–25)
CO2: 24 mmol/L (ref 20–32)
Calcium: 8.9 mg/dL (ref 8.6–10.3)
Chloride: 108 mmol/L (ref 98–110)
Creat: 0.96 mg/dL (ref 0.70–1.33)
GFR, Est African American: 100 mL/min/{1.73_m2} (ref >=60)
GFR, Est Non African American: 86 mL/min/{1.73_m2} (ref >=60)
Globulin: 3.1 g/dL (calc) (ref 1.9–3.7)
Glucose, Bld: 158 mg/dL — ABNORMAL HIGH (ref 65–99)
Potassium: 4.3 mmol/L (ref 3.5–5.3)
Sodium: 140 mmol/L (ref 135–146)
Total Bilirubin: 0.2 mg/dL (ref 0.2–1.2)
Total Protein: 7 g/dL (ref 6.1–8.1)

## 2019-06-03 LAB — CBC WITH DIFFERENTIAL/PLATELET
Absolute Monocytes: 728 cells/uL (ref 200–950)
Basophils Absolute: 41 cells/uL (ref 0–200)
Basophils Relative: 0.6 %
Eosinophils Absolute: 109 cells/uL (ref 15–500)
Eosinophils Relative: 1.6 %
HCT: 41.9 % (ref 38.5–50.0)
Hemoglobin: 13.9 g/dL (ref 13.2–17.1)
Lymphs Abs: 2706 cells/uL (ref 850–3900)
MCH: 27.4 pg (ref 27.0–33.0)
MCHC: 33.2 g/dL (ref 32.0–36.0)
MCV: 82.6 fL (ref 80.0–100.0)
MPV: 11.7 fL (ref 7.5–12.5)
Monocytes Relative: 10.7 %
Neutro Abs: 3216 cells/uL (ref 1500–7800)
Neutrophils Relative %: 47.3 %
Platelets: 263 10*3/uL (ref 140–400)
RBC: 5.07 10*6/uL (ref 4.20–5.80)
RDW: 13.7 % (ref 11.0–15.0)
Total Lymphocyte: 39.8 %
WBC: 6.8 10*3/uL (ref 3.8–10.8)

## 2019-06-03 LAB — PSA: PSA: 0.3 ng/mL (ref <=4.0)

## 2019-06-03 LAB — HEMOGLOBIN A1C
Hgb A1c MFr Bld: 7.5 % of total Hgb — ABNORMAL HIGH (ref <5.7)
Mean Plasma Glucose: 169 (calc)
eAG (mmol/L): 9.3 (calc)

## 2019-06-03 LAB — LIPID PANEL
Cholesterol: 157 mg/dL (ref <200)
HDL: 43 mg/dL (ref >=40)
LDL Cholesterol (Calc): 84 mg/dL (calc)
Non-HDL Cholesterol (Calc): 114 mg/dL (calc) (ref <130)
Total CHOL/HDL Ratio: 3.7 (calc) (ref <5.0)
Triglycerides: 199 mg/dL — ABNORMAL HIGH (ref <150)

## 2019-06-03 MED ORDER — METFORMIN HCL 1000 MG PO TABS: 1000.0000 mg | ORAL_TABLET | Freq: Two times a day (BID) | ORAL | 3 refills | Status: DC

## 2019-06-04 DIAGNOSIS — G4733 Obstructive sleep apnea (adult) (pediatric): Secondary | ICD-10-CM | POA: Diagnosis not present

## 2019-06-06 ENCOUNTER — Telehealth: Payer: Self-pay | Admitting: *Deleted

## 2019-06-06 MED ORDER — AIMOVIG 70 MG/ML ~~LOC~~ SOAJ: 70.0000 mg | SUBCUTANEOUS | 3 refills | Status: DC

## 2019-06-06 NOTE — Telephone Encounter (Signed)
Received PA determination for Aimovig.   PA- [id number] approved 06/04/2019- 03/26/2020.

## 2019-06-28 ENCOUNTER — Ambulatory Visit: Payer: Medicare Other | Attending: Internal Medicine

## 2019-06-28 DIAGNOSIS — Z23 Encounter for immunization: Secondary | ICD-10-CM

## 2019-06-28 NOTE — Progress Notes (Signed)
   Covid-19 Vaccination Clinic  Name:  Kenneth [name]    MRN: [id number] DOB: 03-24-1961  06/28/2019  [name] was observed post Covid-19 immunization for 15 minutes without incident. He was provided with Vaccine Information Sheet and instruction to access the V-Safe system.   [name] was instructed to call 911 with any severe reactions post vaccine: Marland Kitchen Difficulty breathing  . Swelling of face and throat  . A fast heartbeat  . A bad rash all over body  . Dizziness and weakness   Immunizations Administered    Name Date Dose VIS Date Route   Moderna COVID-19 Vaccine 06/28/2019 12:31 PM 0.5 mL 02/25/2019 Intramuscular   Manufacturer: Moderna   Lot: [id number]   [id number]

## 2019-07-02 ENCOUNTER — Telehealth: Payer: Self-pay | Admitting: Family Medicine

## 2019-07-02 NOTE — Telephone Encounter (Signed)
Patient is having leg pain and wants a "couple of hydrocodone for the pain" [place] drug

## 2019-07-03 ENCOUNTER — Other Ambulatory Visit: Payer: Self-pay | Admitting: Family Medicine

## 2019-07-03 MED ORDER — HYDROCODONE-ACETAMINOPHEN 5-325 MG PO TABS: 1.0000 | ORAL_TABLET | Freq: Four times a day (QID) | ORAL | 0 refills | Status: DC | PRN

## 2019-07-03 NOTE — Telephone Encounter (Signed)
Pt 's sister aware.

## 2019-07-03 NOTE — Telephone Encounter (Signed)
I sent 10 pills for breakthrough knee pain.

## 2019-07-05 DIAGNOSIS — G4733 Obstructive sleep apnea (adult) (pediatric): Secondary | ICD-10-CM | POA: Diagnosis not present

## 2019-07-14 DIAGNOSIS — G4733 Obstructive sleep apnea (adult) (pediatric): Secondary | ICD-10-CM | POA: Diagnosis not present

## 2019-07-29 DIAGNOSIS — M1712 Unilateral primary osteoarthritis, left knee: Secondary | ICD-10-CM | POA: Diagnosis not present

## 2019-08-04 DIAGNOSIS — G4733 Obstructive sleep apnea (adult) (pediatric): Secondary | ICD-10-CM | POA: Diagnosis not present

## 2019-08-07 ENCOUNTER — Other Ambulatory Visit: Payer: Self-pay | Admitting: Family Medicine

## 2019-08-07 ENCOUNTER — Telehealth: Payer: Self-pay | Admitting: Family Medicine

## 2019-08-07 DIAGNOSIS — G4452 New daily persistent headache (NDPH): Secondary | ICD-10-CM

## 2019-08-07 MED ORDER — TOPIRAMATE 25 MG PO TABS: 50.0000 mg | ORAL_TABLET | Freq: Two times a day (BID) | ORAL | 5 refills | Status: DC

## 2019-08-07 NOTE — Telephone Encounter (Signed)
Medication called/sent to requested pharmacy  

## 2019-08-07 NOTE — Telephone Encounter (Signed)
Cb#[id number] refill topiramate

## 2019-09-02 DIAGNOSIS — G4733 Obstructive sleep apnea (adult) (pediatric): Secondary | ICD-10-CM | POA: Diagnosis not present

## 2019-09-04 DIAGNOSIS — G4733 Obstructive sleep apnea (adult) (pediatric): Secondary | ICD-10-CM | POA: Diagnosis not present

## 2019-09-25 DIAGNOSIS — G4733 Obstructive sleep apnea (adult) (pediatric): Secondary | ICD-10-CM | POA: Diagnosis not present

## 2019-10-04 DIAGNOSIS — G4733 Obstructive sleep apnea (adult) (pediatric): Secondary | ICD-10-CM | POA: Diagnosis not present

## 2019-10-10 ENCOUNTER — Ambulatory Visit: Payer: Medicare Other | Admitting: Family Medicine

## 2019-10-20 ENCOUNTER — Encounter: Payer: Self-pay | Admitting: Family Medicine

## 2019-10-20 ENCOUNTER — Ambulatory Visit (INDEPENDENT_AMBULATORY_CARE_PROVIDER_SITE_OTHER): Payer: Medicare Other | Admitting: Family Medicine

## 2019-10-20 ENCOUNTER — Other Ambulatory Visit: Payer: Self-pay

## 2019-10-20 VITALS — BP 122/74 | HR 91 | Temp 97.4°F | Ht 66.0 in | Wt 281.0 lb

## 2019-10-20 DIAGNOSIS — M25561 Pain in right knee: Secondary | ICD-10-CM

## 2019-10-20 DIAGNOSIS — G8929 Other chronic pain: Secondary | ICD-10-CM

## 2019-10-20 NOTE — Progress Notes (Signed)
Subjective:    Patient ID: Kenneth [name], male    DOB: May 23, 1960, 59 y.o.   MRN: [id number]  HPI  Patient presents today complaining of right-sided knee pain.  He states his been hurting for several weeks.  The pain has been so bad that he has hesitated to even put weight on his knee at times.  He states that his leg has buckled underneath him on 2 separate occasions causing him to fall due to the pain.  He has pain with flexion greater than 90 degrees.  He has tenderness to palpation over the lateral joint line.  There is no erythema.  There is no warmth.  There is an effusion.  He has pain with flexion and extension.  He also has pain with Apley grind Past Medical History:  Diagnosis Date  . Arthritis   . Asthma   . Chronic bronchitis ([place])   . GERD (gastroesophageal reflux disease)   . Hypercholesteremia   . Hypertension   . OSA treated with BiPAP    ahi-132, bipap 23/19   Past Surgical History:  Procedure Laterality Date  . ESOPHAGOGASTRODUODENOSCOPY (EGD) WITH PROPOFOL N/A 12/11/2012   Procedure: ESOPHAGOGASTRODUODENOSCOPY (EGD) WITH PROPOFOL;  Surgeon: Arta Silence, MD;  Location: [facility];  Service: Endoscopy;  Laterality: N/A;  . FINGER AMPUTATION    . KNEE ARTHROSCOPY Left 11/15/2015   Procedure: ARTHROSCOPY KNEE AND MEDIAL MENISECTOMY;  Surgeon: Paralee Cancel, MD;  Location: [facility];  Service: Orthopedics;  Laterality: Left;   Current Outpatient Medications on File Prior to Visit  Medication Sig Dispense Refill  . albuterol (PROAIR HFA) 108 (90 Base) MCG/ACT inhaler INHALE 2 PUFFS INTO THE LUNGS EVERY 4 HOURS AS NEEDED FOR SHORTNESS OF BREATH 8.5 g 2  . amitriptyline (ELAVIL) 50 MG tablet Take 1 tablet (50 mg total) by mouth at bedtime. 30 tablet 0  . amLODipine (NORVASC) 10 MG tablet TAKE 1 TABLET BY MOUTH DAILY. 90 tablet 3  . aspirin 81 MG tablet Take 81 mg by mouth daily.    Marland Kitchen atorvastatin (LIPITOR) 10 MG tablet Take 1 tablet (10 mg total) by mouth daily. 90 tablet 1   . Blood Glucose Monitoring Suppl (ACCU-CHEK AVIVA PLUS) w/Device KIT Check BS bid 1 kit 1  . Blood Glucose Monitoring Suppl (ONETOUCH VERIO) w/Device KIT     . Erenumab-aooe (AIMOVIG) 70 MG/ML SOAJ Inject 70 mg into the skin every 30 (thirty) days. 1 mL 3  . fluticasone (FLONASE) 50 MCG/ACT nasal spray Place 1 spray into both nostrils daily. 16 g 2  . HYDROcodone-acetaminophen (NORCO) 5-325 MG tablet Take 1 tablet by mouth every 6 (six) hours as needed for moderate pain. 10 tablet 0  . meloxicam (MOBIC) 15 MG tablet Take 1 tablet (15 mg total) by mouth daily. 30 tablet 0  . metFORMIN (GLUCOPHAGE) 1000 MG tablet Take 1 tablet (1,000 mg total) by mouth 2 (two) times daily with a meal. 180 tablet 3  . mupirocin ointment (BACTROBAN) 2 % Apply 1 application topically 2 (two) times daily. 30 g 2  . nitroGLYCERIN (NITROSTAT) 0.4 MG SL tablet Place 1 tablet (0.4 mg total) under the tongue every 5 (five) minutes as needed. For chest pain 20 tablet 1  . omeprazole (PRILOSEC) 40 MG capsule TAKE 1 CAPSULE BY MOUTH DAILY. 90 capsule 3  . topiramate (TOPAMAX) 25 MG tablet Take 2 tablets (50 mg total) by mouth 2 (two) times daily. 120 tablet 5  . UNABLE TO FIND Lidocaine ointment 5%    .  urea (CARMOL) 40 % CREA Apply 1 application topically daily. 85 g 1   Current Facility-Administered Medications on File Prior to Visit  Medication Dose Route Frequency Provider Last Rate Last Admin  . lactated ringers infusion    Continuous PRN Chyrel Masson, CRNA   New Bag at 11/15/15 1545   No Known Allergies Social History   Socioeconomic History  . Marital status: Divorced    Spouse name: Not on file  . Number of children: 0  . Years of education: Not on file  . Highest education level: Not on file  Occupational History  . Not on file  Tobacco Use  . Smoking status: Current Every Day Smoker    Packs/day: 1.00    Years: 40.00    Pack years: 40.00    Types: Cigarettes  . Smokeless tobacco: Never Used   . Tobacco comment: 06/05/17 1 pack per 1 1/2 days  Substance and Sexual Activity  . Alcohol use: Yes    Alcohol/week: 0.0 standard drinks    Comment: some  . Drug use: Yes    Types: Cocaine    Comment: patient states none- read lab report 09/02/2012/ 11-20-12 case cx.due to positive screen test-pt. advised no recreational drugs to be used  . Sexual activity: Not on file  Other Topics Concern  . Not on file  Social History Narrative   Lives with brother and his sister in [employer] [place].     Social Determinants of Health   Financial Resource Strain:   . Difficulty of Paying Living Expenses:   Food Insecurity:   . Worried About Charity fundraiser in the Last Year:   . Arboriculturist in the Last Year:   Transportation Needs:   . Film/video editor (Medical):   Marland Kitchen Lack of Transportation (Non-Medical):   Physical Activity:   . Days of Exercise per Week:   . Minutes of Exercise per Session:   Stress:   . Feeling of Stress :   Social Connections:   . Frequency of Communication with Friends and Family:   . Frequency of Social Gatherings with Friends and Family:   . Attends Religious Services:   . Active Member of Clubs or Organizations:   . Attends Archivist Meetings:   Marland Kitchen Marital Status:   Intimate Partner Violence:   . Fear of Current or Ex-Partner:   . Emotionally Abused:   Marland Kitchen Physically Abused:   . Sexually Abused:      Review of Systems  All other systems reviewed and are negative.      Objective:   Physical Exam  Cardiovascular: Normal rate, regular rhythm and normal heart sounds.   Pulmonary/Chest: Effort normal and breath sounds normal.  Musculoskeletal:       Right knee: He exhibits bony tenderness. He exhibits normal range of motion, no swelling, no effusion, no erythema, no LCL laxity, normal patellar mobility, normal meniscus and no MCL laxity. Tenderness found. Medial joint line, lateral joint line, MCL and LCL tenderness noted.              Assessment & Plan:   .Chronic pain of right knee  I believe this is due to osteoarthritis.  Using sterile technique, I injected the right knee with 2 cc lidocaine, 2 cc of Marcaine, and 2 cc of 40 mg/mL Kenalog.  The patient tolerated the procedure well without complication.

## 2019-10-21 DIAGNOSIS — G4733 Obstructive sleep apnea (adult) (pediatric): Secondary | ICD-10-CM | POA: Diagnosis not present

## 2019-10-29 ENCOUNTER — Other Ambulatory Visit: Payer: Self-pay | Admitting: Family Medicine

## 2019-10-30 ENCOUNTER — Telehealth: Payer: Self-pay | Admitting: Family Medicine

## 2019-10-30 NOTE — Telephone Encounter (Signed)
Error

## 2019-10-30 NOTE — Telephone Encounter (Signed)
error 

## 2019-11-03 ENCOUNTER — Other Ambulatory Visit: Payer: Self-pay

## 2019-11-03 ENCOUNTER — Ambulatory Visit (INDEPENDENT_AMBULATORY_CARE_PROVIDER_SITE_OTHER): Payer: Medicare Other | Admitting: Family Medicine

## 2019-11-03 ENCOUNTER — Encounter: Payer: Self-pay | Admitting: Family Medicine

## 2019-11-03 VITALS — BP 132/84 | HR 91 | Temp 97.0°F | Ht 66.0 in | Wt 287.0 lb

## 2019-11-03 DIAGNOSIS — E1165 Type 2 diabetes mellitus with hyperglycemia: Secondary | ICD-10-CM | POA: Diagnosis not present

## 2019-11-03 NOTE — Progress Notes (Signed)
Subjective:     Patient ID: Kenneth [name], male   DOB: 19-Oct-1960, 59 y.o.   MRN: [id number]  Patient is here today for follow-up of his diabetes.  I last saw the patient in March.  At that time his A1c was 7.5.  He is here today to recheck that.  He continues to smoke.  He denies any chest pain shortness of breath or dyspnea on exertion.  Blood pressure today is well controlled at 132/84.  He received communication in the mail regarding [facility] screening.  He questions whether he needs carotid Dopplers, peripheral artery disease screening, AAA screening, osteoporosis screening or an EKG.  Today he is in normal sinus rhythm based on exam so I do not feel a one-time EKG would give him any added information.  Based on his body weight and his gender, I do not think he needs screening for osteoporosis.  He has no bruit on exam today so I feel that flow limiting severe carotid artery stenosis is unlikely.  He has excellent pulses 2/4 dorsalis pedis and posterior tibialis bilaterally and normal sensation to 10 g monofilament.  Therefore I do not feel that he has any evidence of peripheral vascular disease.  He is a smoker and therefore would qualify for AAA screening when he turns 5.  This would be one test that I think is reasonable however for $150, the patient will defer that at the present time. Past Medical History:  Diagnosis Date  . Arthritis   . Asthma   . Chronic bronchitis ([place])   . GERD (gastroesophageal reflux disease)   . Hypercholesteremia   . Hypertension   . OSA treated with BiPAP    ahi-132, bipap 23/19   Past Surgical History:  Procedure Laterality Date  . ESOPHAGOGASTRODUODENOSCOPY (EGD) WITH PROPOFOL N/A 12/11/2012   Procedure: ESOPHAGOGASTRODUODENOSCOPY (EGD) WITH PROPOFOL;  Surgeon: Arta Silence, MD;  [facility];  Service: Endoscopy;  Laterality: N/A;  . FINGER AMPUTATION    . KNEE ARTHROSCOPY Left 11/15/2015   Procedure: ARTHROSCOPY KNEE AND MEDIAL MENISECTOMY;   Surgeon: Paralee Cancel, MD;  [facility]: [facility];  Service: Orthopedics;  Laterality: Left;   Current Outpatient Medications on File Prior to Visit  Medication Sig Dispense Refill  . AIMOVIG 70 MG/ML SOAJ INJECT 70 MG INTO THE SKIN EVERY 30 DAYS. 1 mL 3  . albuterol (PROAIR HFA) 108 (90 Base) MCG/ACT inhaler INHALE 2 PUFFS INTO THE LUNGS EVERY 4 HOURS AS NEEDED FOR SHORTNESS OF BREATH 8.5 g 2  . amitriptyline (ELAVIL) 50 MG tablet Take 1 tablet (50 mg total) by mouth at bedtime. 30 tablet 0  . amLODipine (NORVASC) 10 MG tablet TAKE 1 TABLET BY MOUTH DAILY. 90 tablet 3  . aspirin 81 MG tablet Take 81 mg by mouth daily.    Marland Kitchen atorvastatin (LIPITOR) 10 MG tablet Take 1 tablet (10 mg total) by mouth daily. 90 tablet 1  . Blood Glucose Monitoring Suppl (ACCU-CHEK AVIVA PLUS) w/Device KIT Check BS bid 1 kit 1  . Blood Glucose Monitoring Suppl (ONETOUCH VERIO) w/Device KIT     . fluticasone (FLONASE) 50 MCG/ACT nasal spray Place 1 spray into both nostrils daily. 16 g 2  . HYDROcodone-acetaminophen (NORCO) 5-325 MG tablet Take 1 tablet by mouth every 6 (six) hours as needed for moderate pain. 10 tablet 0  . meloxicam (MOBIC) 15 MG tablet Take 1 tablet (15 mg total) by mouth daily. 30 tablet 0  . metFORMIN (GLUCOPHAGE) 1000 MG tablet Take 1 tablet (  1,000 mg total) by mouth 2 (two) times daily with a meal. 180 tablet 3  . mupirocin ointment (BACTROBAN) 2 % Apply 1 application topically 2 (two) times daily. 30 g 2  . nitroGLYCERIN (NITROSTAT) 0.4 MG SL tablet Place 1 tablet (0.4 mg total) under the tongue every 5 (five) minutes as needed. For chest pain 20 tablet 1  . omeprazole (PRILOSEC) 40 MG capsule TAKE 1 CAPSULE BY MOUTH DAILY. 90 capsule 3  . topiramate (TOPAMAX) 25 MG tablet Take 2 tablets (50 mg total) by mouth 2 (two) times daily. 120 tablet 5  . UNABLE TO FIND Lidocaine ointment 5%    . urea (CARMOL) 40 % CREA Apply 1 application topically daily. 85 g 1   Current Facility-Administered Medications  on File Prior to Visit  Medication Dose Route Frequency Provider Last Rate Last Admin  . lactated ringers infusion    Continuous PRN Chyrel Masson, CRNA   New Bag at 11/15/15 1545   No Known Allergies Social History   Socioeconomic History  . Marital status: Divorced    Spouse name: Not on file  . Number of children: 0  . Years of education: Not on file  . Highest education level: Not on file  Occupational History  . Not on file  Tobacco Use  . Smoking status: Current Every Day Smoker    Packs/day: 1.00    Years: 40.00    Pack years: 40.00    Types: Cigarettes  . Smokeless tobacco: Never Used  . Tobacco comment: 06/05/17 1 pack per 1 1/2 days  Substance and Sexual Activity  . Alcohol use: Yes    Alcohol/week: 0.0 standard drinks    Comment: some  . Drug use: Yes    Types: Cocaine    Comment: patient states none- read lab report 09/02/2012/ 11-20-12 case cx.due to positive screen test-pt. advised no recreational drugs to be used  . Sexual activity: Not on file  Other Topics Concern  . Not on file  Social History Narrative   Lives with brother and his sister in [employer].     Social Determinants of Health   Financial Resource Strain:   . Difficulty of Paying Living Expenses:   Food Insecurity:   . Worried About Charity fundraiser in the Last Year:   . Arboriculturist in the Last Year:   Transportation Needs:   . Film/video editor (Medical):   Marland Kitchen Lack of Transportation (Non-Medical):   Physical Activity:   . Days of Exercise per Week:   . Minutes of Exercise per Session:   Stress:   . Feeling of Stress :   Social Connections:   . Frequency of Communication with Friends and Family:   . Frequency of Social Gatherings with Friends and Family:   . Attends Religious Services:   . Active Member of Clubs or Organizations:   . Attends Archivist Meetings:   Marland Kitchen Marital Status:   Intimate Partner Violence:   . Fear of Current or Ex-Partner:   . Emotionally  Abused:   Marland Kitchen Physically Abused:   . Sexually Abused:      Review of Systems  All other systems reviewed and are negative.      Objective:   Physical Exam Vitals reviewed.  Eyes:     Pupils: Pupils are equal, round, and reactive to light.  Cardiovascular:     Rate and Rhythm: Normal rate and regular rhythm.     Heart sounds: Normal  heart sounds.  Pulmonary:     Effort: Pulmonary effort is normal. No respiratory distress.     Breath sounds: Normal breath sounds. No wheezing or rales.  Neurological:     Mental Status: He is alert and oriented to person, place, and time.     Cranial Nerves: No cranial nerve deficit.     Motor: No abnormal muscle tone.     Coordination: Coordination normal.     Deep Tendon Reflexes: Reflexes are normal and symmetric.        Assessment:     Type 2 diabetes mellitus with hyperglycemia, without long-term current use of insulin (HCC) - Plan: Hemoglobin A1c, COMPLETE METABOLIC PANEL WITH GFR, Microalbumin, urine, Lipid panel     Plan:     Blood pressure today is well controlled.  Recommended smoking cessation as this I believe is the most beneficial outcome for the patient.  Check a hemoglobin A1c.  Check a CMP.  Check a urine microalbumin.  Check a fasting lipid panel.  Goal LDL cholesterol is less than 100.  Goal A1c is less than 6.5.

## 2019-11-04 DIAGNOSIS — G4733 Obstructive sleep apnea (adult) (pediatric): Secondary | ICD-10-CM | POA: Diagnosis not present

## 2019-11-05 ENCOUNTER — Other Ambulatory Visit: Payer: Self-pay | Admitting: Family Medicine

## 2019-12-05 DIAGNOSIS — G4733 Obstructive sleep apnea (adult) (pediatric): Secondary | ICD-10-CM | POA: Diagnosis not present

## 2019-12-09 DIAGNOSIS — G4733 Obstructive sleep apnea (adult) (pediatric): Secondary | ICD-10-CM | POA: Diagnosis not present

## 2019-12-13 ENCOUNTER — Other Ambulatory Visit: Payer: Self-pay

## 2019-12-13 ENCOUNTER — Ambulatory Visit (HOSPITAL_COMMUNITY)
Admission: EM | Admit: 2019-12-13 | Discharge: 2019-12-13 | Discharge status: Against Medical Advice | Payer: Medicare Other

## 2019-12-13 NOTE — ED Triage Notes (Signed)
Pt called from waiting room without any response. Per registration, pt has been called to register multiple times without any response.

## 2019-12-15 ENCOUNTER — Other Ambulatory Visit: Payer: Self-pay

## 2019-12-15 ENCOUNTER — Ambulatory Visit (INDEPENDENT_AMBULATORY_CARE_PROVIDER_SITE_OTHER): Payer: Medicare Other | Admitting: Family Medicine

## 2019-12-15 VITALS — BP 110/70 | HR 92 | Temp 98.0°F | Ht 66.0 in | Wt 278.0 lb

## 2019-12-15 DIAGNOSIS — L03032 Cellulitis of left toe: Secondary | ICD-10-CM

## 2019-12-15 MED ORDER — CEPHALEXIN 500 MG PO CAPS: 500.0000 mg | ORAL_CAPSULE | Freq: Three times a day (TID) | ORAL | 0 refills | Status: DC

## 2019-12-15 MED ORDER — SENNOSIDES-DOCUSATE SODIUM 8.6-50 MG PO TABS: 2.0000 | ORAL_TABLET | Freq: Every evening | ORAL | 2 refills | Status: DC | PRN

## 2019-12-15 NOTE — Progress Notes (Signed)
Subjective:     Patient ID: Kenneth [name], male   DOB: 10/28/1960, 59 y.o.   MRN: [id number]  Thursday, the patient lost his balance and slipped in the shower.  He sustained a very superficial scratch on the medial aspect of his left second toe from the MTP joint to the DIP joint.  The toe in that area now is erythematous warm and painful.  He also reports trouble going to the bathroom.  He has been having constipation. Past Medical History:  Diagnosis Date  . Arthritis   . Asthma   . Chronic bronchitis ([place])   . GERD (gastroesophageal reflux disease)   . Hypercholesteremia   . Hypertension   . OSA treated with BiPAP    ahi-132, bipap 23/19   Past Surgical History:  Procedure Laterality Date  . ESOPHAGOGASTRODUODENOSCOPY (EGD) WITH PROPOFOL N/A 12/11/2012   Procedure: ESOPHAGOGASTRODUODENOSCOPY (EGD) WITH PROPOFOL;  Surgeon: Arta Silence, MD;  [facility];  Service: Endoscopy;  Laterality: N/A;  . FINGER AMPUTATION    . KNEE ARTHROSCOPY Left 11/15/2015   Procedure: ARTHROSCOPY KNEE AND MEDIAL MENISECTOMY;  Surgeon: Paralee Cancel, MD;  [facility];  Service: Orthopedics;  Laterality: Left;   Current Outpatient Medications on File Prior to Visit  Medication Sig Dispense Refill  . AIMOVIG 70 MG/ML SOAJ INJECT 70 MG INTO THE SKIN EVERY 30 DAYS. 1 mL 3  . albuterol (VENTOLIN HFA) 108 (90 Base) MCG/ACT inhaler INHALE 2 PUFFS INTO THE LUNGS EVERY 4 HOURS AS NEEDED FOR SHORTNESS OF BREATH 8.5 g 2  . amitriptyline (ELAVIL) 50 MG tablet Take 1 tablet (50 mg total) by mouth at bedtime. 30 tablet 0  . amLODipine (NORVASC) 10 MG tablet TAKE 1 TABLET BY MOUTH DAILY. 90 tablet 3  . aspirin 81 MG tablet Take 81 mg by mouth daily.    Marland Kitchen atorvastatin (LIPITOR) 10 MG tablet Take 1 tablet (10 mg total) by mouth daily. 90 tablet 1  . Blood Glucose Monitoring Suppl (ACCU-CHEK AVIVA PLUS) w/Device KIT Check BS bid 1 kit 1  . Blood Glucose Monitoring Suppl (ONETOUCH VERIO) w/Device KIT     .  fluticasone (FLONASE) 50 MCG/ACT nasal spray Place 1 spray into both nostrils daily. 16 g 2  . meloxicam (MOBIC) 15 MG tablet Take 1 tablet (15 mg total) by mouth daily. 30 tablet 0  . metFORMIN (GLUCOPHAGE) 1000 MG tablet Take 1 tablet (1,000 mg total) by mouth 2 (two) times daily with a meal. 180 tablet 3  . mupirocin ointment (BACTROBAN) 2 % Apply 1 application topically 2 (two) times daily. 30 g 2  . nitroGLYCERIN (NITROSTAT) 0.4 MG SL tablet Place 1 tablet (0.4 mg total) under the tongue every 5 (five) minutes as needed. For chest pain 20 tablet 1  . omeprazole (PRILOSEC) 40 MG capsule TAKE 1 CAPSULE BY MOUTH DAILY. 90 capsule 3  . topiramate (TOPAMAX) 25 MG tablet Take 2 tablets (50 mg total) by mouth 2 (two) times daily. 120 tablet 5  . UNABLE TO FIND Lidocaine ointment 5%    . urea (CARMOL) 40 % CREA Apply 1 application topically daily. 85 g 1  . HYDROcodone-acetaminophen (NORCO) 5-325 MG tablet Take 1 tablet by mouth every 6 (six) hours as needed for moderate pain. (Patient not taking: Reported on 12/15/2019) 10 tablet 0   Current Facility-Administered Medications on File Prior to Visit  Medication Dose Route Frequency Provider Last Rate Last Admin  . lactated ringers infusion    Continuous PRN Frankey Shown  G, CRNA   New Bag at 11/15/15 1545   No Known Allergies Social History   Socioeconomic History  . Marital status: Divorced    Spouse name: Not on file  . Number of children: 0  . Years of education: Not on file  . Highest education level: Not on file  Occupational History  . Not on file  Tobacco Use  . Smoking status: Current Every Day Smoker    Packs/day: 1.00    Years: 40.00    Pack years: 40.00    Types: Cigarettes  . Smokeless tobacco: Never Used  . Tobacco comment: 06/05/17 1 pack per 1 1/2 days  Substance and Sexual Activity  . Alcohol use: Yes    Alcohol/week: 0.0 standard drinks    Comment: some  . Drug use: Yes    Types: Cocaine    Comment: patient  states none- read lab report 09/02/2012/ 11-20-12 case cx.due to positive screen test-pt. advised no recreational drugs to be used  . Sexual activity: Not on file  Other Topics Concern  . Not on file  Social History Narrative   Lives with brother and his sister in [employer].     Social Determinants of Health   Financial Resource Strain:   . Difficulty of Paying Living Expenses: Not on file  Food Insecurity:   . Worried About Charity fundraiser in the Last Year: Not on file  . Ran Out of Food in the Last Year: Not on file  Transportation Needs:   . Lack of Transportation (Medical): Not on file  . Lack of Transportation (Non-Medical): Not on file  Physical Activity:   . Days of Exercise per Week: Not on file  . Minutes of Exercise per Session: Not on file  Stress:   . Feeling of Stress : Not on file  Social Connections:   . Frequency of Communication with Friends and Family: Not on file  . Frequency of Social Gatherings with Friends and Family: Not on file  . Attends Religious Services: Not on file  . Active Member of Clubs or Organizations: Not on file  . Attends Archivist Meetings: Not on file  . Marital Status: Not on file  Intimate Partner Violence:   . Fear of Current or Ex-Partner: Not on file  . Emotionally Abused: Not on file  . Physically Abused: Not on file  . Sexually Abused: Not on file     Review of Systems  All other systems reviewed and are negative.      Objective:   Physical Exam Vitals reviewed.  Eyes:     Pupils: Pupils are equal, round, and reactive to light.  Cardiovascular:     Rate and Rhythm: Normal rate and regular rhythm.     Heart sounds: Normal heart sounds.  Pulmonary:     Effort: Pulmonary effort is normal. No respiratory distress.     Breath sounds: Normal breath sounds. No wheezing or rales.  Musculoskeletal:     Left foot: Swelling, laceration and tenderness present.       Feet:  Neurological:     Mental Status: He is alert  and oriented to person, place, and time.     Cranial Nerves: No cranial nerve deficit.     Motor: No abnormal muscle tone.     Coordination: Coordination normal.     Deep Tendon Reflexes: Reflexes are normal and symmetric.        Assessment:     Cellulitis of left toe  Plan:     Begin Keflex 500 mg p.o. 3 times daily for 7 days.  Recheck immediately if worsening.  Otherwise recheck in 1 week.  Begin Senokot S2 tablets p.o. nightly as needed constipation

## 2019-12-16 ENCOUNTER — Encounter: Payer: Self-pay | Admitting: Adult Health

## 2019-12-16 ENCOUNTER — Ambulatory Visit (INDEPENDENT_AMBULATORY_CARE_PROVIDER_SITE_OTHER): Payer: Medicare Other | Admitting: Adult Health

## 2019-12-16 VITALS — BP 130/73 | HR 86 | Ht 66.0 in | Wt 277.0 lb

## 2019-12-16 DIAGNOSIS — G4733 Obstructive sleep apnea (adult) (pediatric): Secondary | ICD-10-CM | POA: Diagnosis not present

## 2019-12-16 NOTE — Progress Notes (Addendum)
PATIENT: Kenneth [name] DOB: [birth year]  REASON FOR VISIT: follow up HISTORY FROM: patient  HISTORY OF PRESENT ILLNESS: Today 12/16/19:  [name] is a 59 year old male with a history of obstructive sleep apnea on BiPAP.  He returns today for follow-up.  His download indicates that he uses machine 28 out of 30 days for compliance of 93%.  He uses machine greater than 4 hours 14 days for compliance of 47%.  On average he uses his machine 4 hours and 9 minutes.  His residual AHI is 0.5 on 21/17 centimeters of water.  Leak in the 95th percentile is 26.7 L/min.  Patient reports that he does not sleep well at night.  He recently saw his PCP who has him on Elavil.  The patient states that he has had some constipation due to this medication.  HISTORY 12/16/18:  [name] is a 59 year old male with a history of obstructive sleep apnea on BiPAP.  He returns today for follow-up.  His download indicates that he uses machine 22 out of 30 days for compliance of 73%.  He uses machine greater than 4 hours 17 days for compliance of 57%.  On average he uses his machine 5 hours and 53 minutes.  His residual AHI is 1.6 on 21/17 centimeters of water.  His leak in the 95th percentile is 25.7 L/min.  There are still some nights that he takes off his mask unknowingly.  Overall he feels that the CPAP is working fairly well for him.  He does notice the mask leaking but reports that he just changed out his mask.  REVIEW OF SYSTEMS: Out of a complete 14 system review of symptoms, the patient complains only of the following symptoms, and all other reviewed systems are negative.  See HPI  ALLERGIES: No Known Allergies  HOME MEDICATIONS: Outpatient Medications Prior to Visit  Medication Sig Dispense Refill  . AIMOVIG 70 MG/ML SOAJ INJECT 70 MG INTO THE SKIN EVERY 30 DAYS. 1 mL 3  . albuterol (VENTOLIN HFA) 108 (90 Base) MCG/ACT inhaler INHALE 2 PUFFS INTO THE LUNGS EVERY 4 HOURS AS NEEDED FOR SHORTNESS OF BREATH 8.5  g 2  . amitriptyline (ELAVIL) 50 MG tablet Take 1 tablet (50 mg total) by mouth at bedtime. 30 tablet 0  . amLODipine (NORVASC) 10 MG tablet TAKE 1 TABLET BY MOUTH DAILY. 90 tablet 3  . aspirin 81 MG tablet Take 81 mg by mouth daily.    Marland Kitchen atorvastatin (LIPITOR) 10 MG tablet Take 1 tablet (10 mg total) by mouth daily. 90 tablet 1  . Blood Glucose Monitoring Suppl (ACCU-CHEK AVIVA PLUS) w/Device KIT Check BS bid 1 kit 1  . Blood Glucose Monitoring Suppl (ONETOUCH VERIO) w/Device KIT     . cephALEXin (KEFLEX) 500 MG capsule Take 1 capsule (500 mg total) by mouth 3 (three) times daily. 21 capsule 0  . fluticasone (FLONASE) 50 MCG/ACT nasal spray Place 1 spray into both nostrils daily. 16 g 2  . HYDROcodone-acetaminophen (NORCO) 5-325 MG tablet Take 1 tablet by mouth every 6 (six) hours as needed for moderate pain. (Patient not taking: Reported on 12/15/2019) 10 tablet 0  . meloxicam (MOBIC) 15 MG tablet Take 1 tablet (15 mg total) by mouth daily. 30 tablet 0  . metFORMIN (GLUCOPHAGE) 1000 MG tablet Take 1 tablet (1,000 mg total) by mouth 2 (two) times daily with a meal. 180 tablet 3  . mupirocin ointment (BACTROBAN) 2 % Apply 1 application topically 2 (two) times daily.  30 g 2  . nitroGLYCERIN (NITROSTAT) 0.4 MG SL tablet Place 1 tablet (0.4 mg total) under the tongue every 5 (five) minutes as needed. For chest pain 20 tablet 1  . omeprazole (PRILOSEC) 40 MG capsule TAKE 1 CAPSULE BY MOUTH DAILY. 90 capsule 3  . senna-docusate (SENOKOT S) 8.6-50 MG tablet Take 2 tablets by mouth at bedtime as needed for mild constipation. 60 tablet 2  . topiramate (TOPAMAX) 25 MG tablet Take 2 tablets (50 mg total) by mouth 2 (two) times daily. 120 tablet 5  . UNABLE TO FIND Lidocaine ointment 5%    . urea (CARMOL) 40 % CREA Apply 1 application topically daily. 85 g 1   Facility-Administered Medications Prior to Visit  Medication Dose Route Frequency Provider Last Rate Last Admin  . lactated ringers infusion     Continuous PRN Chyrel Masson, CRNA   New Bag at 11/15/15 1545    PAST MEDICAL HISTORY: Past Medical History:  Diagnosis Date  . Arthritis   . Asthma   . Chronic bronchitis ([place])   . GERD (gastroesophageal reflux disease)   . Hypercholesteremia   . Hypertension   . OSA treated with BiPAP    ahi-132, bipap 23/19    PAST SURGICAL HISTORY: Past Surgical History:  Procedure Laterality Date  . ESOPHAGOGASTRODUODENOSCOPY (EGD) WITH PROPOFOL N/A 12/11/2012   Procedure: ESOPHAGOGASTRODUODENOSCOPY (EGD) WITH PROPOFOL;  Surgeon: Arta Silence, MD;  [facility];  Service: Endoscopy;  Laterality: N/A;  . FINGER AMPUTATION    . KNEE ARTHROSCOPY Left 11/15/2015   Procedure: ARTHROSCOPY KNEE AND MEDIAL MENISECTOMY;  Surgeon: Paralee Cancel, MD;  [facility];  Service: Orthopedics;  Laterality: Left;    FAMILY HISTORY: Family History  Problem Relation Age of Onset  . Hypertension Father   . Diabetes Father   . Diabetes Other        Multiple family members  . Diabetes Sister   . Diabetes Brother   . Stroke Sister     SOCIAL HISTORY: Social History   Socioeconomic History  . Marital status: Divorced    Spouse name: Not on file  . Number of children: 0  . Years of education: Not on file  . Highest education level: Not on file  Occupational History  . Not on file  Tobacco Use  . Smoking status: Current Every Day Smoker    Packs/day: 1.00    Years: 40.00    Pack years: 40.00    Types: Cigarettes  . Smokeless tobacco: Never Used  . Tobacco comment: 06/05/17 1 pack per 1 1/2 days  Substance and Sexual Activity  . Alcohol use: Yes    Alcohol/week: 0.0 standard drinks    Comment: some  . Drug use: Yes    Types: Cocaine    Comment: patient states none- read lab report 09/02/2012/ 11-20-12 case cx.due to positive screen test-pt. advised no recreational drugs to be used  . Sexual activity: Not on file  Other Topics Concern  . Not on file  Social History  Narrative   Lives with brother and his sister in [employer].     Social Determinants of Health   Financial Resource Strain:   . Difficulty of Paying Living Expenses: Not on file  Food Insecurity:   . Worried About Charity fundraiser in the Last Year: Not on file  . Ran Out of Food in the Last Year: Not on file  Transportation Needs:   . Lack of Transportation (Medical): Not on file  .  Lack of Transportation (Non-Medical): Not on file  Physical Activity:   . Days of Exercise per Week: Not on file  . Minutes of Exercise per Session: Not on file  Stress:   . Feeling of Stress : Not on file  Social Connections:   . Frequency of Communication with Friends and Family: Not on file  . Frequency of Social Gatherings with Friends and Family: Not on file  . Attends Religious Services: Not on file  . Active Member of Clubs or Organizations: Not on file  . Attends Archivist Meetings: Not on file  . Marital Status: Not on file  Intimate Partner Violence:   . Fear of Current or Ex-Partner: Not on file  . Emotionally Abused: Not on file  . Physically Abused: Not on file  . Sexually Abused: Not on file      PHYSICAL EXAM  Vitals:   12/16/19 1056  BP: 130/73  Pulse: 86  Weight: 277 lb (125.6 kg)  Height: '5\' 6"'  (1.676 m)   Body mass index is 44.71 kg/m.  Generalized: Well developed, in no acute distress  Chest: Lungs clear to auscultation bilaterally  Neurological examination  Mentation: Alert oriented to time, place, history taking. Follows all commands speech and language fluent Cranial nerve II-XII: Extraocular movements were full, visual field were full on confrontational test Head turning and shoulder shrug  were normal and symmetric. Motor: The motor testing reveals 5 over 5 strength of all 4 extremities. Good symmetric motor tone is noted throughout.  Sensory: Sensory testing is intact to soft touch on all 4 extremities. No evidence of extinction is noted.  Gait and  station: Gait is normal.    DIAGNOSTIC DATA (LABS, IMAGING, TESTING) - I reviewed patient records, labs, notes, testing and imaging myself where available.  Lab Results  Component Value Date   WBC 6.8 06/02/2019   HGB 13.9 06/02/2019   HCT 41.9 06/02/2019   MCV 82.6 06/02/2019   PLT 263 06/02/2019      Component Value Date/Time   NA 140 06/02/2019 1203   K 4.3 06/02/2019 1203   CL 108 06/02/2019 1203   CO2 24 06/02/2019 1203   GLUCOSE 158 (H) 06/02/2019 1203   GLUCOSE 321 (H) 01/28/2016 1144   BUN 19 06/02/2019 1203   CREATININE 0.96 06/02/2019 1203   CALCIUM 8.9 06/02/2019 1203   PROT 7.0 06/02/2019 1203   ALBUMIN 4.0 08/25/2016 1039   AST 15 06/02/2019 1203   ALT 18 06/02/2019 1203   ALKPHOS 63 08/25/2016 1039   BILITOT 0.2 06/02/2019 1203   GFRNONAA 86 06/02/2019 1203   GFRAA 100 06/02/2019 1203   Lab Results  Component Value Date   CHOL 157 06/02/2019   HDL 43 06/02/2019   LDLCALC 84 06/02/2019   TRIG 199 (H) 06/02/2019   CHOLHDL 3.7 06/02/2019   Lab Results  Component Value Date   HGBA1C 7.5 (H) 06/02/2019     ASSESSMENT AND PLAN 59 y.o. year old male  has a past medical history of Arthritis, Asthma, Chronic bronchitis ([place]), GERD (gastroesophageal reflux disease), Hypercholesteremia, Hypertension, and OSA treated with BiPAP. here with:  1. OSA on BiPAP  -BiPAP compliance is suboptimal-only using the machine greater than 4 hours for compliance of 47% - Good treatment of AHI when he uses the machine - Encourage patient to use CPAP nightly and > 4 hours each night -We discussed sleep hygiene and tips to improve his sleep routine - F/U in 1 year or sooner  if needed   I spent 20 minutes of face-to-face and non-face-to-face time with patient.  This included previsit chart review, lab review, study review, order entry, electronic health record documentation, patient education.  Ward Givens, MSN, NP-C 12/16/2019, 10:49 AM [facility] [address], [place], [place] [address] [phone]  I reviewed the above note and documentation by the Nurse Practitioner and agree with the history, exam, assessment and plan as outlined above. I was available for consultation. Star Age, MD, PhD [facility])

## 2019-12-16 NOTE — Patient Instructions (Signed)
Continue using BiPAP nightly and greater than 4 hours each night If your symptoms worsen or you develop new symptoms please let us know.    

## 2019-12-18 ENCOUNTER — Other Ambulatory Visit: Payer: Medicare Other

## 2019-12-18 ENCOUNTER — Other Ambulatory Visit: Payer: Self-pay

## 2019-12-18 ENCOUNTER — Ambulatory Visit (INDEPENDENT_AMBULATORY_CARE_PROVIDER_SITE_OTHER): Payer: Medicare Other | Admitting: Family Medicine

## 2019-12-18 ENCOUNTER — Ambulatory Visit
Admission: RE | Admit: 2019-12-18 | Discharge: 2019-12-18 | Disposition: A | Discharge status: Home / Self Care | Payer: Medicare Other | Source: Ambulatory Visit | Attending: Family Medicine | Admitting: Family Medicine

## 2019-12-18 VITALS — BP 110/80 | HR 84 | Temp 98.2°F | Ht 66.0 in | Wt 277.0 lb

## 2019-12-18 DIAGNOSIS — L03032 Cellulitis of left toe: Secondary | ICD-10-CM

## 2019-12-18 DIAGNOSIS — S9032XA Contusion of left foot, initial encounter: Secondary | ICD-10-CM | POA: Diagnosis not present

## 2019-12-18 DIAGNOSIS — M7732 Calcaneal spur, left foot: Secondary | ICD-10-CM | POA: Diagnosis not present

## 2019-12-18 DIAGNOSIS — E1165 Type 2 diabetes mellitus with hyperglycemia: Secondary | ICD-10-CM | POA: Diagnosis not present

## 2019-12-18 MED ORDER — SULFAMETHOXAZOLE-TRIMETHOPRIM 800-160 MG PO TABS: 1.0000 | ORAL_TABLET | Freq: Two times a day (BID) | ORAL | 0 refills | Status: DC

## 2019-12-18 NOTE — Progress Notes (Signed)
Subjective:     Patient ID: Kenneth [name], male   DOB: 02/02/1961, 59 y.o.   MRN: [id number] 12/15/19 Thursday, the patient lost his balance and slipped in the shower.  He sustained a very superficial scratch on the medial aspect of his left second toe from the MTP joint to the DIP joint.  The toe in that area now is erythematous warm and painful.  He also reports trouble going to the bathroom.  He has been having constipation.  At that time, my plan was: Begin Keflex 500 mg p.o. 3 times daily for 7 days.  Recheck immediately if worsening.  Otherwise recheck in 1 week.  Begin Senokot S2 tablets p.o. nightly as needed constipation   12/18/19   Patient states that he feels like the toe is not getting any better.  He thinks it is getting worse.  I included a photograph above.  The redness has faded substantially.  3 days ago the entire dorsal surface of the toe was pink and erythematous and warm.  Today there is only a slight bit of pink skin around the first scab.  The toe is no longer warm to the touch however is very tender near the scab to the touch.  Tenderness is out of proportion to the exam.  The patient is having a hard time putting weight on his toe.  He states that it hurts to bend or move his toe severely.  He has excellent pulses in his foot.  He has normal capillary refill.  I do not feel that the infection is worsening.  Clinically the infection appears better. Past Medical History:  Diagnosis Date  . Arthritis   . Asthma   . Chronic bronchitis ([place])   . GERD (gastroesophageal reflux disease)   . Hypercholesteremia   . Hypertension   . OSA treated with BiPAP    ahi-132, bipap 23/19   Past Surgical History:  Procedure Laterality Date  . ESOPHAGOGASTRODUODENOSCOPY (EGD) WITH PROPOFOL N/A 12/11/2012   Procedure: ESOPHAGOGASTRODUODENOSCOPY (EGD) WITH PROPOFOL;  Surgeon: Arta Silence, MD;  Location: [facility];  Service: Endoscopy;  Laterality: N/A;  . FINGER AMPUTATION    . KNEE  ARTHROSCOPY Left 11/15/2015   Procedure: ARTHROSCOPY KNEE AND MEDIAL MENISECTOMY;  Surgeon: Paralee Cancel, MD;  Location: [facility];  Service: Orthopedics;  Laterality: Left;   Current Outpatient Medications on File Prior to Visit  Medication Sig Dispense Refill  . AIMOVIG 70 MG/ML SOAJ INJECT 70 MG INTO THE SKIN EVERY 30 DAYS. 1 mL 3  . albuterol (VENTOLIN HFA) 108 (90 Base) MCG/ACT inhaler INHALE 2 PUFFS INTO THE LUNGS EVERY 4 HOURS AS NEEDED FOR SHORTNESS OF BREATH 8.5 g 2  . amitriptyline (ELAVIL) 50 MG tablet Take 1 tablet (50 mg total) by mouth at bedtime. 30 tablet 0  . amLODipine (NORVASC) 10 MG tablet TAKE 1 TABLET BY MOUTH DAILY. 90 tablet 3  . aspirin 81 MG tablet Take 81 mg by mouth daily.    Marland Kitchen atorvastatin (LIPITOR) 10 MG tablet Take 1 tablet (10 mg total) by mouth daily. 90 tablet 1  . Blood Glucose Monitoring Suppl (ACCU-CHEK AVIVA PLUS) w/Device KIT Check BS bid 1 kit 1  . Blood Glucose Monitoring Suppl (ONETOUCH VERIO) w/Device KIT     . cephALEXin (KEFLEX) 500 MG capsule Take 1 capsule (500 mg total) by mouth 3 (three) times daily. 21 capsule 0  . fluticasone (FLONASE) 50 MCG/ACT nasal spray Place 1 spray into both nostrils daily. 16 g 2  .  HYDROcodone-acetaminophen (NORCO) 5-325 MG tablet Take 1 tablet by mouth every 6 (six) hours as needed for moderate pain. 10 tablet 0  . meloxicam (MOBIC) 15 MG tablet Take 1 tablet (15 mg total) by mouth daily. 30 tablet 0  . metFORMIN (GLUCOPHAGE) 1000 MG tablet Take 1 tablet (1,000 mg total) by mouth 2 (two) times daily with a meal. 180 tablet 3  . mupirocin ointment (BACTROBAN) 2 % Apply 1 application topically 2 (two) times daily. 30 g 2  . nitroGLYCERIN (NITROSTAT) 0.4 MG SL tablet Place 1 tablet (0.4 mg total) under the tongue every 5 (five) minutes as needed. For chest pain 20 tablet 1  . omeprazole (PRILOSEC) 40 MG capsule TAKE 1 CAPSULE BY MOUTH DAILY. 90 capsule 3  . senna-docusate (SENOKOT S) 8.6-50 MG tablet Take 2 tablets by mouth  at bedtime as needed for mild constipation. 60 tablet 2  . topiramate (TOPAMAX) 25 MG tablet Take 2 tablets (50 mg total) by mouth 2 (two) times daily. 120 tablet 5  . UNABLE TO FIND Lidocaine ointment 5%    . urea (CARMOL) 40 % CREA Apply 1 application topically daily. 85 g 1   Current Facility-Administered Medications on File Prior to Visit  Medication Dose Route Frequency Provider Last Rate Last Admin  . lactated ringers infusion    Continuous PRN Chyrel Masson, CRNA   New Bag at 11/15/15 1545   No Known Allergies Social History   Socioeconomic History  . Marital status: Divorced    Spouse name: Not on file  . Number of children: 0  . Years of education: Not on file  . Highest education level: Not on file  Occupational History  . Not on file  Tobacco Use  . Smoking status: Current Every Day Smoker    Packs/day: 1.00    Years: 40.00    Pack years: 40.00    Types: Cigarettes  . Smokeless tobacco: Never Used  . Tobacco comment: 06/05/17 1 pack per 1 1/2 days  Substance and Sexual Activity  . Alcohol use: Yes    Alcohol/week: 0.0 standard drinks    Comment: some  . Drug use: Yes    Types: Cocaine    Comment: patient states none- read lab report 09/02/2012/ 11-20-12 case cx.due to positive screen test-pt. advised no recreational drugs to be used  . Sexual activity: Not on file  Other Topics Concern  . Not on file  Social History Narrative   Lives with brother and his sister in [place].     Social Determinants of Health   Financial Resource Strain:   . Difficulty of Paying Living Expenses: Not on file  Food Insecurity:   . Worried About Charity fundraiser in the Last Year: Not on file  . Ran Out of Food in the Last Year: Not on file  Transportation Needs:   . Lack of Transportation (Medical): Not on file  . Lack of Transportation (Non-Medical): Not on file  Physical Activity:   . Days of Exercise per Week: Not on file  . Minutes of Exercise per Session: Not on file   Stress:   . Feeling of Stress : Not on file  Social Connections:   . Frequency of Communication with Friends and Family: Not on file  . Frequency of Social Gatherings with Friends and Family: Not on file  . Attends Religious Services: Not on file  . Active Member of Clubs or Organizations: Not on file  . Attends Archivist  Meetings: Not on file  . Marital Status: Not on file  Intimate Partner Violence:   . Fear of Current or Ex-Partner: Not on file  . Emotionally Abused: Not on file  . Physically Abused: Not on file  . Sexually Abused: Not on file     Review of Systems  All other systems reviewed and are negative.      Objective:   Physical Exam Vitals reviewed.  Eyes:     Pupils: Pupils are equal, round, and reactive to light.  Cardiovascular:     Rate and Rhythm: Normal rate and regular rhythm.     Heart sounds: Normal heart sounds.  Pulmonary:     Effort: Pulmonary effort is normal. No respiratory distress.     Breath sounds: Normal breath sounds. No wheezing or rales.  Musculoskeletal:     Left foot: Swelling, laceration and tenderness present.       Feet:  Neurological:     Mental Status: He is alert and oriented to person, place, and time.     Cranial Nerves: No cranial nerve deficit.     Motor: No abnormal muscle tone.     Coordination: Coordination normal.     Deep Tendon Reflexes: Reflexes are normal and symmetric.        Assessment:      Cellulitis of left toe      Plan:    Clinically cellulitis appears to be improving.  I will switch his Keflex to Bactrim to cover MRSA just in case given his reported increase in pain.  He does have a family history of MRSA in his sister-in-law.  I will also obtain an x-ray of the foot to rule out a fracture in the toe as this may explain some of the pain.  I do not see any signs of the infection is spreading deeper into the foot.  If anything it looks like it is improving.

## 2019-12-19 ENCOUNTER — Telehealth: Payer: Self-pay | Admitting: Family Medicine

## 2019-12-19 LAB — COMPLETE METABOLIC PANEL WITH GFR
AG Ratio: 1.2 (calc) (ref 1.0–2.5)
ALT: 18 U/L (ref 9–46)
AST: 15 U/L (ref 10–35)
Albumin: 4.3 g/dL (ref 3.6–5.1)
Alkaline phosphatase (APISO): 67 U/L (ref 35–144)
BUN: 11 mg/dL (ref 7–25)
CO2: 25 mmol/L (ref 20–32)
Calcium: 9.9 mg/dL (ref 8.6–10.3)
Chloride: 104 mmol/L (ref 98–110)
Creat: 0.86 mg/dL (ref 0.70–1.33)
GFR, Est African American: 110 mL/min/{1.73_m2} (ref >=60)
GFR, Est Non African American: 95 mL/min/{1.73_m2} (ref >=60)
Globulin: 3.7 g/dL (calc) (ref 1.9–3.7)
Glucose, Bld: 104 mg/dL — ABNORMAL HIGH (ref 65–99)
Potassium: 4.5 mmol/L (ref 3.5–5.3)
Sodium: 139 mmol/L (ref 135–146)
Total Bilirubin: 0.4 mg/dL (ref 0.2–1.2)
Total Protein: 8 g/dL (ref 6.1–8.1)

## 2019-12-19 LAB — LIPID PANEL
Cholesterol: 187 mg/dL (ref <200)
HDL: 59 mg/dL (ref >=40)
LDL Cholesterol (Calc): 109 mg/dL (calc) — ABNORMAL HIGH
Non-HDL Cholesterol (Calc): 128 mg/dL (calc) (ref <130)
Total CHOL/HDL Ratio: 3.2 (calc) (ref <5.0)
Triglycerides: 91 mg/dL (ref <150)

## 2019-12-19 LAB — MICROALBUMIN, URINE: Microalb, Ur: 0.6 mg/dL

## 2019-12-19 LAB — HEMOGLOBIN A1C
Hgb A1c MFr Bld: 6.3 % of total Hgb — ABNORMAL HIGH (ref <5.7)
Mean Plasma Glucose: 134 (calc)
eAG (mmol/L): 7.4 (calc)

## 2019-12-19 NOTE — Telephone Encounter (Signed)
CB # [phone] [facility] call xray results

## 2019-12-20 ENCOUNTER — Other Ambulatory Visit: Payer: Self-pay | Admitting: Family Medicine

## 2019-12-22 NOTE — Telephone Encounter (Signed)
Results were given on 12-19-19 Pt sister n law verbalized understanding

## 2020-01-04 DIAGNOSIS — G4733 Obstructive sleep apnea (adult) (pediatric): Secondary | ICD-10-CM | POA: Diagnosis not present

## 2020-01-08 ENCOUNTER — Other Ambulatory Visit: Payer: Self-pay | Admitting: Family Medicine

## 2020-01-08 NOTE — Telephone Encounter (Signed)
Pt is requesting a refill on cephalexin.   Please Advise

## 2020-01-23 DIAGNOSIS — G4733 Obstructive sleep apnea (adult) (pediatric): Secondary | ICD-10-CM | POA: Diagnosis not present

## 2020-01-26 ENCOUNTER — Other Ambulatory Visit: Payer: Self-pay

## 2020-01-26 ENCOUNTER — Ambulatory Visit (INDEPENDENT_AMBULATORY_CARE_PROVIDER_SITE_OTHER): Payer: Medicare Other | Admitting: Family Medicine

## 2020-01-26 DIAGNOSIS — Z23 Encounter for immunization: Secondary | ICD-10-CM

## 2020-02-04 DIAGNOSIS — G4733 Obstructive sleep apnea (adult) (pediatric): Secondary | ICD-10-CM | POA: Diagnosis not present

## 2020-02-10 ENCOUNTER — Other Ambulatory Visit: Payer: Self-pay | Admitting: Family Medicine

## 2020-02-17 ENCOUNTER — Emergency Department (HOSPITAL_COMMUNITY): Payer: Medicare Other

## 2020-02-17 ENCOUNTER — Other Ambulatory Visit: Payer: Self-pay

## 2020-02-17 ENCOUNTER — Emergency Department (HOSPITAL_COMMUNITY)
Admission: EM | Admit: 2020-02-17 | Discharge: 2020-02-17 | Disposition: A | Discharge status: Home / Self Care | Payer: Medicare Other | Attending: Emergency Medicine | Admitting: Emergency Medicine

## 2020-02-17 ENCOUNTER — Encounter (HOSPITAL_COMMUNITY): Payer: Self-pay

## 2020-02-17 DIAGNOSIS — E119 Type 2 diabetes mellitus without complications: Secondary | ICD-10-CM | POA: Insufficient documentation

## 2020-02-17 DIAGNOSIS — F17210 Nicotine dependence, cigarettes, uncomplicated: Secondary | ICD-10-CM | POA: Insufficient documentation

## 2020-02-17 DIAGNOSIS — I10 Essential (primary) hypertension: Secondary | ICD-10-CM | POA: Insufficient documentation

## 2020-02-17 DIAGNOSIS — Z7982 Long term (current) use of aspirin: Secondary | ICD-10-CM | POA: Insufficient documentation

## 2020-02-17 DIAGNOSIS — R29810 Facial weakness: Secondary | ICD-10-CM | POA: Diagnosis present

## 2020-02-17 DIAGNOSIS — Z7984 Long term (current) use of oral hypoglycemic drugs: Secondary | ICD-10-CM | POA: Diagnosis not present

## 2020-02-17 DIAGNOSIS — Z7951 Long term (current) use of inhaled steroids: Secondary | ICD-10-CM | POA: Diagnosis not present

## 2020-02-17 DIAGNOSIS — R29818 Other symptoms and signs involving the nervous system: Secondary | ICD-10-CM | POA: Diagnosis not present

## 2020-02-17 DIAGNOSIS — G510 Bell's palsy: Secondary | ICD-10-CM | POA: Insufficient documentation

## 2020-02-17 DIAGNOSIS — Z79899 Other long term (current) drug therapy: Secondary | ICD-10-CM | POA: Insufficient documentation

## 2020-02-17 DIAGNOSIS — J45909 Unspecified asthma, uncomplicated: Secondary | ICD-10-CM | POA: Diagnosis not present

## 2020-02-17 LAB — DIFFERENTIAL
Abs Immature Granulocytes: 0.02 10*3/uL (ref 0.00–0.07)
Basophils Absolute: 0.1 10*3/uL (ref 0.0–0.1)
Basophils Relative: 1 %
Eosinophils Absolute: 0.1 10*3/uL (ref 0.0–0.5)
Eosinophils Relative: 1 %
Immature Granulocytes: 0 %
Lymphocytes Relative: 43 %
Lymphs Abs: 3.3 10*3/uL (ref 0.7–4.0)
Monocytes Absolute: 0.7 10*3/uL (ref 0.1–1.0)
Monocytes Relative: 10 %
Neutro Abs: 3.4 10*3/uL (ref 1.7–7.7)
Neutrophils Relative %: 45 %

## 2020-02-17 LAB — COMPREHENSIVE METABOLIC PANEL
ALT: 22 U/L (ref 0–44)
AST: 19 U/L (ref 15–41)
Albumin: 3.8 g/dL (ref 3.5–5.0)
Alkaline Phosphatase: 65 U/L (ref 38–126)
Anion gap: 13 (ref 5–15)
BUN: 9 mg/dL (ref 6–20)
CO2: 20 mmol/L — ABNORMAL LOW (ref 22–32)
Calcium: 9.6 mg/dL (ref 8.9–10.3)
Chloride: 106 mmol/L (ref 98–111)
Creatinine, Ser: 1.01 mg/dL (ref 0.61–1.24)
GFR, Estimated: >60 mL/min (ref >60)
Glucose, Bld: 110 mg/dL — ABNORMAL HIGH (ref 70–99)
Potassium: 3.9 mmol/L (ref 3.5–5.1)
Sodium: 139 mmol/L (ref 135–145)
Total Bilirubin: 0.2 mg/dL — ABNORMAL LOW (ref 0.3–1.2)
Total Protein: 8 g/dL (ref 6.5–8.1)

## 2020-02-17 LAB — PROTIME-INR
INR: 1 (ref 0.8–1.2)
Prothrombin Time: 12.7 seconds (ref 11.4–15.2)

## 2020-02-17 LAB — CBC
HCT: 48.3 % (ref 39.0–52.0)
Hemoglobin: 15 g/dL (ref 13.0–17.0)
MCH: 26.5 pg (ref 26.0–34.0)
MCHC: 31.1 g/dL (ref 30.0–36.0)
MCV: 85.2 fL (ref 80.0–100.0)
Platelets: 303 10*3/uL (ref 150–400)
RBC: 5.67 MIL/uL (ref 4.22–5.81)
RDW: 15.3 % (ref 11.5–15.5)
WBC: 7.6 10*3/uL (ref 4.0–10.5)
nRBC: 0 % (ref 0.0–0.2)

## 2020-02-17 LAB — APTT: aPTT: 34 seconds (ref 24–36)

## 2020-02-17 MED ORDER — VALACYCLOVIR HCL 500 MG PO TABS
1000.0000 mg | ORAL_TABLET | Freq: Once | ORAL | Status: AC
  Administered 2020-02-17: 1000 mg via ORAL
  Filled 2020-02-17 (×2): qty 2

## 2020-02-17 MED ORDER — PREDNISONE 10 MG PO TABS: 20.0000 mg | ORAL_TABLET | Freq: Two times a day (BID) | ORAL | 0 refills | Status: DC

## 2020-02-17 MED ORDER — VALACYCLOVIR HCL 1 G PO TABS: 1000.0000 mg | ORAL_TABLET | Freq: Three times a day (TID) | ORAL | 0 refills | Status: DC

## 2020-02-17 MED ORDER — PREDNISONE 20 MG PO TABS
20.0000 mg | ORAL_TABLET | Freq: Once | ORAL | Status: AC
  Administered 2020-02-17: 20 mg via ORAL
  Filled 2020-02-17: qty 1

## 2020-02-17 MED ORDER — SODIUM CHLORIDE 0.9% FLUSH: 3.0000 mL | Freq: Once | INTRAVENOUS | Status: DC

## 2020-02-17 NOTE — ED Notes (Signed)
Mary Yip(Sister/Point of Contact#[phone]) called/would like a call back on update on patient's status.  Thank you

## 2020-02-17 NOTE — ED Provider Notes (Addendum)
[facility] EMERGENCY DEPARTMENT Provider Note   CSN: [id number] Arrival date & time: 02/17/20  1324     History Chief Complaint  Patient presents with  . Facial Droop    Kenneth [name] is a 59 y.o. male.  Patient is a 59 year old male with past medical history of hypertension, hyperlipidemia, diabetes.  He presents today for evaluation of left facial droop.  Patient first noticed this 2 days ago while he was brushing his teeth.  He noticed the water running out of his mouth.  He then noticed a droop to his face and forehead.  He is also noticed that he is unable to close his left eye completely.  He denies any arm or leg weakness or numbness.  He denies any headache or visual disturbances.  He has never had this happen before.  The history is provided by the patient.       Past Medical History:  Diagnosis Date  . Arthritis   . Asthma   . Chronic bronchitis ([place])   . GERD (gastroesophageal reflux disease)   . Hypercholesteremia   . Hypertension   . OSA treated with BiPAP    ahi-132, bipap 23/19    Patient Active Problem List   Diagnosis Date Noted  . DM type 2 (diabetes mellitus, type 2) ([place]) 02/25/2019  . OSA treated with BiPAP   . Syncope 05/13/2014  . Asthma   . Knee pain   . Chest pain 10/23/2011  . Dysphagia 10/23/2011  . Hyperlipidemia 10/23/2011  . Obesity 10/23/2011    Past Surgical History:  Procedure Laterality Date  . ESOPHAGOGASTRODUODENOSCOPY (EGD) WITH PROPOFOL N/A 12/11/2012   Procedure: ESOPHAGOGASTRODUODENOSCOPY (EGD) WITH PROPOFOL;  Surgeon: Arta Silence, MD;  Location: [facility];  Service: Endoscopy;  Laterality: N/A;  . FINGER AMPUTATION    . KNEE ARTHROSCOPY Left 11/15/2015   Procedure: ARTHROSCOPY KNEE AND MEDIAL MENISECTOMY;  Surgeon: Paralee Cancel, MD;  Location: [facility];  Service: Orthopedics;  Laterality: Left;       Family History  Problem Relation Age of Onset  . Hypertension Father   . Diabetes Father   .  Diabetes Other        Multiple family members  . Diabetes Sister   . Diabetes Brother   . Stroke Sister     Social History   Tobacco Use  . Smoking status: Current Every Day Smoker    Packs/day: 1.00    Years: 40.00    Pack years: 40.00    Types: Cigarettes  . Smokeless tobacco: Never Used  . Tobacco comment: 06/05/17 1 pack per 1 1/2 days  Substance Use Topics  . Alcohol use: Yes    Alcohol/week: 0.0 standard drinks    Comment: some  . Drug use: Yes    Types: Cocaine    Comment: patient states none- read lab report 09/02/2012/ 11-20-12 case cx.due to positive screen test-pt. advised no recreational drugs to be used    Home Medications Prior to Admission medications   Medication Sig Start Date End Date Taking? Authorizing Provider  AIMOVIG 70 MG/ML SOAJ INJECT 70 MG INTO THE SKIN EVERY 30 DAYS. 10/30/19   [name], MD  albuterol (VENTOLIN HFA) 108 (90 Base) MCG/ACT inhaler INHALE 2 PUFFS INTO THE LUNGS EVERY 4 HOURS AS NEEDED FOR SHORTNESS OF BREATH 11/06/19   [name], MD  amitriptyline (ELAVIL) 50 MG tablet Take 1 tablet (50 mg total) by mouth at bedtime. 02/28/18   [name],  MD  amLODipine (NORVASC) 10 MG tablet TAKE 1 TABLET BY MOUTH DAILY. 02/10/20   [name], MD  aspirin 81 MG tablet Take 81 mg by mouth daily.    [provider]  atorvastatin (LIPITOR) 10 MG tablet TAKE 1 TABLET BY MOUTH DAILY. 12/22/19   [name], MD  Blood Glucose Monitoring Suppl (ACCU-CHEK AVIVA PLUS) w/Device KIT Check BS bid 07/09/17   [name], MD  Blood Glucose Monitoring Suppl (ONETOUCH VERIO) w/Device KIT  01/28/16   [provider]  cephALEXin (KEFLEX) 500 MG capsule Take 1 capsule (500 mg total) by mouth 3 (three) times daily. 12/15/19   [name], MD  fluticasone (FLONASE) 50 MCG/ACT nasal spray Place 1 spray into both nostrils daily. 05/04/17   Zigmund Gottron, NP  HYDROcodone-acetaminophen (NORCO) 5-325 MG tablet Take 1  tablet by mouth every 6 (six) hours as needed for moderate pain. 07/03/19   [name], MD  meloxicam (MOBIC) 15 MG tablet Take 1 tablet (15 mg total) by mouth daily. 02/28/19   Alycia Rossetti, MD  metFORMIN (GLUCOPHAGE) 1000 MG tablet Take 1 tablet (1,000 mg total) by mouth 2 (two) times daily with a meal. 06/03/19   [name], MD  mupirocin ointment (BACTROBAN) 2 % Apply 1 application topically 2 (two) times daily. 03/06/19   Trula Slade, DPM  nitroGLYCERIN (NITROSTAT) 0.4 MG SL tablet Place 1 tablet (0.4 mg total) under the tongue every 5 (five) minutes as needed. For chest pain 03/01/15   Vic Blackbird F, MD  omeprazole (PRILOSEC) 40 MG capsule TAKE 1 CAPSULE BY MOUTH DAILY. 03/11/19   [name], MD  senna-docusate (SENOKOT S) 8.6-50 MG tablet Take 2 tablets by mouth at bedtime as needed for mild constipation. 12/15/19   [name], MD  sulfamethoxazole-trimethoprim (BACTRIM DS) 800-160 MG tablet Take 1 tablet by mouth 2 (two) times daily. 12/18/19   [name], MD  topiramate (TOPAMAX) 25 MG tablet Take 2 tablets (50 mg total) by mouth 2 (two) times daily. 08/07/19   [name], MD  UNABLE TO FIND Lidocaine ointment 5%    [provider]  urea (CARMOL) 40 % CREA Apply 1 application topically daily. 03/06/19   Trula Slade, DPM    Allergies    Patient has no known allergies.  Review of Systems   Review of Systems  All other systems reviewed and are negative.   Physical Exam Updated Vital Signs BP 127/76   Pulse 72   Temp 98.2 F (36.8 C) (Oral)   Resp 18   Ht '5\' 6"'  (1.676 m)   Wt 127 kg   SpO2 100%   BMI 45.19 kg/m   Physical Exam Vitals and nursing note reviewed.  Constitutional:      General: He is not in acute distress.    Appearance: He is well-developed. He is not diaphoretic.  HENT:     Head: Normocephalic and atraumatic.  Eyes:     Extraocular Movements: Extraocular movements intact.     Pupils: Pupils  are equal, round, and reactive to light.     Comments: The left cornea appears clear.  Cardiovascular:     Rate and Rhythm: Normal rate and regular rhythm.     Heart sounds: No murmur heard.  No friction rub.  Pulmonary:     Effort: Pulmonary effort is normal. No respiratory distress.     Breath sounds: Normal breath sounds. No wheezing or  rales.  Abdominal:     General: Bowel sounds are normal. There is no distension.     Palpations: Abdomen is soft.     Tenderness: There is no abdominal tenderness.  Musculoskeletal:        General: Normal range of motion.     Cervical back: Normal range of motion and neck supple.  Skin:    General: Skin is warm and dry.  Neurological:     Mental Status: He is alert and oriented to person, place, and time.     Cranial Nerves: Cranial nerve deficit present.     Coordination: Coordination normal.     Comments: There is a left-sided facial droop noted.  This involves the forehead.  He also has weakness and incomplete closure of the left eyelid.     ED Results / Procedures / Treatments   Labs (all labs ordered are listed, but only abnormal results are displayed) Labs Reviewed  COMPREHENSIVE METABOLIC PANEL - Abnormal; Notable for the following components:      Result Value   CO2 20 (*)    Glucose, Bld 110 (*)    Total Bilirubin 0.2 (*)    All other components within normal limits  PROTIME-INR  APTT  CBC  DIFFERENTIAL    EKG EKG Interpretation  Date/Time:  Tuesday February 17 2020 13:32:10 EST Ventricular Rate:  97 PR Interval:  152 QRS Duration: 116 QT Interval:  374 QTC Calculation: 474 R Axis:   -55 Text Interpretation: Normal sinus rhythm Left anterior fascicular block Left ventricular hypertrophy with QRS widening ( R in aVL , Cornell product ) Abnormal ECG No significant change since 11/15/2015 Confirmed by Veryl Speak [phone]) on 02/17/2020 5:31:43 PM   Radiology No results found.  Procedures Procedures (including  critical care time)  Medications Ordered in ED Medications  sodium chloride flush (NS) 0.9 % injection 3 mL (has no administration in time range)    ED Course  I have reviewed the triage vital signs and the nursing notes.  Pertinent labs & imaging results that were available during my care of the patient were reviewed by me and considered in my medical decision making (see chart for details).    MDM Rules/Calculators/A&P  Patient is a 59 year old male with complaints of left-sided facial droop.  Patient has a droop on exam along with incomplete closure and weakness of the eyelid.  The weakness also involves the forehead.  His presentation is most consistent with a Bell's palsy.  He has no involvement of the arms and legs and head CT and laboratory studies are unremarkable.  At this point discharge seems appropriate with prednisone and Valtrex.  Patient to return as needed.  He should see his doctor in the next week for follow-up.  Final Clinical Impression(s) / ED Diagnoses Final diagnoses:  None    Rx / DC Orders ED Discharge Orders    None       Veryl Speak, MD 02/17/20 Einar Crow    Veryl Speak, MD 02/17/20 1911

## 2020-02-17 NOTE — Discharge Instructions (Addendum)
Begin taking Valtrex and prednisone as prescribed.  Follow-up with your primary doctor in the next week to be rechecked, and return to the ER if symptoms significantly worsen or change in the meantime.

## 2020-02-17 NOTE — ED Triage Notes (Signed)
Pt having left sided facial droop for the past 2 days, no other symptoms. No other neuro deficits noted in triage. Pt a.o

## 2020-02-18 ENCOUNTER — Ambulatory Visit: Payer: Medicare Other | Admitting: Family Medicine

## 2020-02-27 ENCOUNTER — Encounter: Payer: Self-pay | Admitting: Family Medicine

## 2020-02-27 ENCOUNTER — Ambulatory Visit (INDEPENDENT_AMBULATORY_CARE_PROVIDER_SITE_OTHER): Payer: Medicare Other | Admitting: Family Medicine

## 2020-02-27 VITALS — BP 142/88 | HR 86 | Temp 97.8°F | Resp 14 | Ht 66.0 in | Wt 282.0 lb

## 2020-02-27 DIAGNOSIS — G510 Bell's palsy: Secondary | ICD-10-CM

## 2020-02-27 MED ORDER — HYDROCODONE-ACETAMINOPHEN 5-325 MG PO TABS: 1.0000 | ORAL_TABLET | Freq: Four times a day (QID) | ORAL | 0 refills | Status: DC | PRN

## 2020-02-27 NOTE — Patient Instructions (Signed)
We will schedule eye exam Pain medication refilled Use artificial tears, use eye patch I will send a note to your neurologist  F/u as needed

## 2020-02-27 NOTE — Progress Notes (Signed)
   Subjective:    Patient ID: Kenneth [name], male    DOB: 1960-04-04, 59 y.o.   MRN: [id number]  Patient presents for ER F/U (bells palsy on L side)  Patient here for ER follow-up.  He was in the emergency room after he experienced acute facial droop.  ASA for 2 days before he presented to the ER.  He was unable to close his left eye completely.  He was diagnosed with Bell's palsy.  Prednisone and Valtrex. CT of the head was unremarkable no acute stroke noted CBC and metabolic panel unremarkable  He has been using artificial tears   Had tenderness to face  near left temple region on the same side as palsy No new symptoms   DM controlled, recent increase in lipitor to 20mg  once a day     Review Of Systems:  GEN- denies fatigue, fever, weight loss,weakness, recent illness HEENT- denies eye drainage, change in vision, nasal discharge, CVS- denies chest pain, palpitations RESP- denies SOB, cough, wheeze ABD- denies N/V, change in stools, abd pain GU- denies dysuria, hematuria, dribbling, incontinence MSK- denies joint pain, muscle aches, injury Neuro- denies headache, dizziness, syncope, seizure activity       Objective:    BP (!) 142/88   Pulse 86   Temp 97.8 F (36.6 C) (Temporal)   Resp 14   Ht 5\' 6"  (1.676 m)   Wt 282 lb (127.9 kg)   SpO2 94%   BMI 45.52 kg/m  GEN- NAD, alert and oriented x3 HEENT- PERRL, EOMI, non injected sclera, pink conjunctiva, MMM, oropharynx clear, tearing left eye  Neck- Supple, no thyromegaly CVS- RRR, no murmur RESP-CTAB NEURO- left facial droop. Left upper lid unable to close Mild TTP over left temple, From UPPER AND LOWER ext, sensation in tact Upper Lower ext,baseline gait  EXT- No edema Pulses- Radial, DP- 2+        Assessment & Plan:      Problem List Items Addressed This Visit    None    Visit Diagnoses    Bell's palsy    -  Primary   acute bells palsy, has completed steroids, valtrex, given refill on norco, will get appt  with eye doctor and alert his neurologist of the new findings, but unfortunatley not a lot more to offer He is on Elavil and topamax already       Note: This dictation was prepared with Dragon dictation along with smaller phrase technology. Any transcriptional errors that result from this process are unintentional.

## 2020-02-29 ENCOUNTER — Encounter: Payer: Self-pay | Admitting: Family Medicine

## 2020-03-05 DIAGNOSIS — G4733 Obstructive sleep apnea (adult) (pediatric): Secondary | ICD-10-CM | POA: Diagnosis not present

## 2020-03-08 DIAGNOSIS — G4733 Obstructive sleep apnea (adult) (pediatric): Secondary | ICD-10-CM | POA: Diagnosis not present

## 2020-03-18 ENCOUNTER — Other Ambulatory Visit: Payer: Self-pay | Admitting: Family Medicine

## 2020-03-29 ENCOUNTER — Encounter: Payer: Self-pay | Admitting: Family Medicine

## 2020-03-29 DIAGNOSIS — H35363 Drusen (degenerative) of macula, bilateral: Secondary | ICD-10-CM | POA: Diagnosis not present

## 2020-03-29 DIAGNOSIS — H2513 Age-related nuclear cataract, bilateral: Secondary | ICD-10-CM | POA: Diagnosis not present

## 2020-03-29 DIAGNOSIS — G510 Bell's palsy: Secondary | ICD-10-CM | POA: Diagnosis not present

## 2020-03-29 DIAGNOSIS — E119 Type 2 diabetes mellitus without complications: Secondary | ICD-10-CM | POA: Diagnosis not present

## 2020-03-29 DIAGNOSIS — H35033 Hypertensive retinopathy, bilateral: Secondary | ICD-10-CM | POA: Diagnosis not present

## 2020-03-29 LAB — HM DIABETES EYE EXAM

## 2020-04-02 ENCOUNTER — Telehealth: Payer: Self-pay | Admitting: Family Medicine

## 2020-04-02 ENCOUNTER — Other Ambulatory Visit: Payer: Self-pay | Admitting: Family Medicine

## 2020-04-02 NOTE — Telephone Encounter (Signed)
Duplicate request

## 2020-04-02 NOTE — Telephone Encounter (Signed)
Aimovig 70 mg/ml

## 2020-04-05 ENCOUNTER — Ambulatory Visit: Payer: Medicare Other | Admitting: Family Medicine

## 2020-04-05 DIAGNOSIS — M1712 Unilateral primary osteoarthritis, left knee: Secondary | ICD-10-CM | POA: Diagnosis not present

## 2020-04-05 DIAGNOSIS — M25561 Pain in right knee: Secondary | ICD-10-CM | POA: Diagnosis not present

## 2020-04-05 DIAGNOSIS — M1711 Unilateral primary osteoarthritis, right knee: Secondary | ICD-10-CM | POA: Diagnosis not present

## 2020-04-05 DIAGNOSIS — G4733 Obstructive sleep apnea (adult) (pediatric): Secondary | ICD-10-CM | POA: Diagnosis not present

## 2020-04-05 DIAGNOSIS — M25562 Pain in left knee: Secondary | ICD-10-CM | POA: Diagnosis not present

## 2020-05-06 ENCOUNTER — Ambulatory Visit: Payer: Medicare Other | Admitting: Family Medicine

## 2020-05-07 DIAGNOSIS — G4733 Obstructive sleep apnea (adult) (pediatric): Secondary | ICD-10-CM | POA: Diagnosis not present

## 2020-05-08 ENCOUNTER — Emergency Department (HOSPITAL_COMMUNITY): Payer: Medicare Other

## 2020-05-08 ENCOUNTER — Emergency Department (HOSPITAL_COMMUNITY)
Admission: EM | Admit: 2020-05-08 | Discharge: 2020-05-08 | Disposition: A | Discharge status: Home / Self Care | Payer: Medicare Other | Attending: Emergency Medicine | Admitting: Emergency Medicine

## 2020-05-08 ENCOUNTER — Other Ambulatory Visit: Payer: Self-pay

## 2020-05-08 DIAGNOSIS — F17210 Nicotine dependence, cigarettes, uncomplicated: Secondary | ICD-10-CM | POA: Insufficient documentation

## 2020-05-08 DIAGNOSIS — J45909 Unspecified asthma, uncomplicated: Secondary | ICD-10-CM | POA: Insufficient documentation

## 2020-05-08 DIAGNOSIS — M25561 Pain in right knee: Secondary | ICD-10-CM | POA: Diagnosis not present

## 2020-05-08 DIAGNOSIS — R5381 Other malaise: Secondary | ICD-10-CM | POA: Diagnosis not present

## 2020-05-08 DIAGNOSIS — Z7984 Long term (current) use of oral hypoglycemic drugs: Secondary | ICD-10-CM | POA: Diagnosis not present

## 2020-05-08 DIAGNOSIS — E119 Type 2 diabetes mellitus without complications: Secondary | ICD-10-CM | POA: Diagnosis not present

## 2020-05-08 DIAGNOSIS — Z79899 Other long term (current) drug therapy: Secondary | ICD-10-CM | POA: Insufficient documentation

## 2020-05-08 DIAGNOSIS — Z7951 Long term (current) use of inhaled steroids: Secondary | ICD-10-CM | POA: Insufficient documentation

## 2020-05-08 DIAGNOSIS — Z7982 Long term (current) use of aspirin: Secondary | ICD-10-CM | POA: Insufficient documentation

## 2020-05-08 DIAGNOSIS — R69 Illness, unspecified: Secondary | ICD-10-CM | POA: Diagnosis not present

## 2020-05-08 DIAGNOSIS — I10 Essential (primary) hypertension: Secondary | ICD-10-CM | POA: Diagnosis not present

## 2020-05-08 MED ORDER — OXYCODONE-ACETAMINOPHEN 5-325 MG PO TABS
2.0000 | ORAL_TABLET | Freq: Once | ORAL | Status: AC
  Administered 2020-05-08: 2 via ORAL
  Filled 2020-05-08: qty 2

## 2020-05-08 NOTE — ED Triage Notes (Signed)
Pt came from home via EMS. Past 2 days worsening right knee pain on lateral side. Pt denies any injury mechanism. Pt reports past hx of bone on bone in right knee. Denies any past knee surgery. Pt sit to stand upon arrival with 2 person assist ambulating d/t severity of pain. A&O x 4 136/84 86 18 RR 98% 135 CBG

## 2020-05-08 NOTE — ED Provider Notes (Signed)
[place] DEPT Provider Note   CSN: [id number] Arrival date & time: 05/08/20  0938     History Chief Complaint  Patient presents with  . Knee Pain    Kenneth [name] is a 60 y.o. male.  HPI    60 year old male history of DJD presents today complaining of right knee pain.  He states that he was driving yesterday when he bent his knee up a little bit he has severe pain in the right lateral aspect.  It hurts more now with movement.  He is able to move but it is painful to put weight on.  He denies any direct trauma.  There is no pain in the knee.  There is no redness, swelling, or warmth.  He has had some similar pain previously with his DJD Past Medical History:  Diagnosis Date  . Arthritis   . Asthma   . Chronic bronchitis ([place])   . GERD (gastroesophageal reflux disease)   . Hypercholesteremia   . Hypertension   . OSA treated with BiPAP    ahi-132, bipap 23/19    Patient Active Problem List   Diagnosis Date Noted  . DM type 2 (diabetes mellitus, type 2) ([place]) 02/25/2019  . OSA treated with BiPAP   . Syncope 05/13/2014  . Asthma   . Knee pain   . Chest pain 10/23/2011  . Dysphagia 10/23/2011  . Hyperlipidemia 10/23/2011  . Obesity 10/23/2011    Past Surgical History:  Procedure Laterality Date  . ESOPHAGOGASTRODUODENOSCOPY (EGD) WITH PROPOFOL N/A 12/11/2012   Procedure: ESOPHAGOGASTRODUODENOSCOPY (EGD) WITH PROPOFOL;  Surgeon: Arta Silence, MD;  Location: [facility];  Service: Endoscopy;  Laterality: N/A;  . FINGER AMPUTATION    . KNEE ARTHROSCOPY Left 11/15/2015   Procedure: ARTHROSCOPY KNEE AND MEDIAL MENISECTOMY;  Surgeon: Paralee Cancel, MD;  Location: [facility];  Service: Orthopedics;  Laterality: Left;       Family History  Problem Relation Age of Onset  . Hypertension Father   . Diabetes Father   . Diabetes Other        Multiple family members  . Diabetes Sister   . Diabetes Brother   . Stroke Sister     Social History    Tobacco Use  . Smoking status: Current Every Day Smoker    Packs/day: 1.00    Years: 40.00    Pack years: 40.00    Types: Cigarettes  . Smokeless tobacco: Never Used  . Tobacco comment: 06/05/17 1 pack per 1 1/2 days  Substance Use Topics  . Alcohol use: Yes    Alcohol/week: 0.0 standard drinks    Comment: some  . Drug use: Yes    Types: Cocaine    Comment: patient states none- read lab report 09/02/2012/ 11-20-12 case cx.due to positive screen test-pt. advised no recreational drugs to be used    Home Medications Prior to Admission medications   Medication Sig Start Date End Date Taking? Authorizing Provider  AIMOVIG 70 MG/ML SOAJ INJECT 70 MG INTO THE SKIN EVERY 30 DAYS. 04/02/20   [name], MD  albuterol (VENTOLIN HFA) 108 (90 Base) MCG/ACT inhaler INHALE 2 PUFFS INTO THE LUNGS EVERY 4 HOURS AS NEEDED FOR SHORTNESS OF BREATH 11/06/19   [name], MD  amitriptyline (ELAVIL) 50 MG tablet Take 1 tablet (50 mg total) by mouth at bedtime. 02/28/18   [name], MD  amLODipine (NORVASC) 10 MG tablet TAKE 1 TABLET BY MOUTH DAILY. 02/10/20   [name]  T, MD  aspirin 81 MG tablet Take 81 mg by mouth daily.    [provider]  atorvastatin (LIPITOR) 10 MG tablet TAKE 1 TABLET BY MOUTH DAILY. 12/22/19   [name], MD  Blood Glucose Monitoring Suppl (ACCU-CHEK AVIVA PLUS) w/Device KIT Check BS bid 07/09/17   [name], MD  fluticasone [facility]) 50 MCG/ACT nasal spray Place 1 spray into both nostrils daily. 05/04/17   Zigmund Gottron, NP  HYDROcodone-acetaminophen (NORCO) 5-325 MG tablet Take 1 tablet by mouth every 6 (six) hours as needed for moderate pain. 02/27/20   [place], Modena Nunnery, MD  meloxicam (MOBIC) 15 MG tablet Take 1 tablet (15 mg total) by mouth daily. 02/28/19   Alycia Rossetti, MD  metFORMIN (GLUCOPHAGE) 1000 MG tablet Take 1 tablet (1,000 mg total) by mouth 2 (two) times daily with a meal. 06/03/19   [name], MD  mupirocin  ointment (BACTROBAN) 2 % Apply 1 application topically 2 (two) times daily. 03/06/19   Trula Slade, DPM  nitroGLYCERIN (NITROSTAT) 0.4 MG SL tablet Place 1 tablet (0.4 mg total) under the tongue every 5 (five) minutes as needed. For chest pain 03/01/15   Vic Blackbird F, MD  omeprazole (PRILOSEC) 40 MG capsule TAKE 1 CAPSULE BY MOUTH DAILY. 03/22/20   [name], MD  senna-docusate (SENOKOT S) 8.6-50 MG tablet Take 2 tablets by mouth at bedtime as needed for mild constipation. 12/15/19   [name], MD  topiramate (TOPAMAX) 25 MG tablet Take 2 tablets (50 mg total) by mouth 2 (two) times daily. 08/07/19   [name], MD  UNABLE TO FIND Lidocaine ointment 5%    [provider]  urea (CARMOL) 40 % CREA Apply 1 application topically daily. 03/06/19   Trula Slade, DPM  valACYclovir (VALTREX) 1000 MG tablet Take 1 tablet (1,000 mg total) by mouth 3 (three) times daily. 02/17/20   Veryl Speak, MD    Allergies    Patient has no known allergies.  Review of Systems   Review of Systems  All other systems reviewed and are negative.   Physical Exam Updated Vital Signs BP 118/80 (BP Location: Left Arm)   Pulse 86   Temp 98.1 F (36.7 C) (Oral)   Resp (!) 24   Ht 1.676 m ('5\' 6"' )   Wt 126.6 kg   SpO2 95%   BMI 45.03 kg/m   Physical Exam Vitals and nursing note reviewed.  Constitutional:      Appearance: Normal appearance. He is well-developed.  HENT:     Head: Normocephalic and atraumatic.     Right Ear: External ear normal.     Left Ear: External ear normal.     Nose: Nose normal.  Eyes:     Extraocular Movements: EOM normal.  Neck:     Trachea: No tracheal deviation.  Pulmonary:     Effort: Pulmonary effort is normal.  Musculoskeletal:        General: Normal range of motion.     Comments: Right knee with some point tenderness along the lateral inferior aspect No crepitus, warmth, or effusion noted Patella is midline He is able to  range the knee through a full active range of motion. There is no tenderness over the hip or ankle.  There is no evidence of swelling, cord or other symptoms of DVT Dorsal pedalis pulses are 2+ Sensation is intact throughout  Skin:    General: Skin is warm and dry.  Neurological:     Mental Status: He is alert and oriented to person, place, and time.  Psychiatric:        Mood and Affect: Mood and affect normal.        Behavior: Behavior normal.     ED Results / Procedures / Treatments   Labs (all labs ordered are listed, but only abnormal results are displayed) Labs Reviewed - No data to display  EKG None  Radiology DG Knee Complete 4 Views Right  Result Date: 05/08/2020 CLINICAL DATA:  Worsening right lateral knee pain EXAM: RIGHT KNEE - COMPLETE 4+ VIEW COMPARISON:  08/11/2015 FINDINGS: No evidence of fracture, dislocation, or joint effusion. No evidence of arthropathy or other focal bone abnormality. Soft tissues are unremarkable. IMPRESSION: No acute osseous finding Electronically Signed   By: Jerilynn Mages.  Shick M.D.   On: 05/08/2020 09:42    Procedures Procedures   Medications Ordered in ED Medications  oxyCODONE-acetaminophen (PERCOCET/ROXICET) 5-325 MG per tablet 2 tablet (2 tablets Oral Given 05/08/20 3462)    ED Course  I have reviewed the triage vital signs and the nursing notes.  Pertinent labs & imaging results that were available during my care of the patient were reviewed by me and considered in my medical decision making (see chart for details).    MDM Rules/Calculators/A&P                          Patient presents with atraumatic right knee pain.  X-rays obtained show no evidence of fracture or other acute abnormality.  There is no evidence of effusion.  No warmth, or swelling to indicate infection.  Patient is able to range the knee with pain.  Plan symptomatic management with anti-inflammatories and will give crutches to ambulate as needed.  Outpatient follow-up  per patient's primary provider. Final Clinical Impression(s) / ED Diagnoses Final diagnoses:  Acute pain of right knee    Rx / DC Orders ED Discharge Orders    None       Pattricia Boss, MD 05/08/20 [id number]

## 2020-05-08 NOTE — Discharge Instructions (Addendum)
No evidence of break or other acute abnormalities were seen on your x-Kenneth [name]. Please add ibuprofen 400 mg up to 3 times a day for the next 5 days. Use crutches as needed to walk Please follow-up with your doctor in the next 3 to 5 days if symptoms have not improved.

## 2020-05-11 ENCOUNTER — Other Ambulatory Visit: Payer: Self-pay

## 2020-05-11 ENCOUNTER — Ambulatory Visit (INDEPENDENT_AMBULATORY_CARE_PROVIDER_SITE_OTHER): Payer: Medicare Other | Admitting: Family Medicine

## 2020-05-11 VITALS — BP 138/70 | HR 69 | Temp 97.6°F | Resp 17 | Ht 66.0 in | Wt 283.0 lb

## 2020-05-11 DIAGNOSIS — G8929 Other chronic pain: Secondary | ICD-10-CM

## 2020-05-11 DIAGNOSIS — M25561 Pain in right knee: Secondary | ICD-10-CM | POA: Diagnosis not present

## 2020-05-11 MED ORDER — OXYCODONE-ACETAMINOPHEN 7.5-325 MG PO TABS
1.0000 | ORAL_TABLET | ORAL | 0 refills | Status: DC | PRN
Start: 2020-05-11 — End: 2020-07-14

## 2020-05-11 NOTE — Progress Notes (Signed)
Subjective:    Patient ID: Kenneth [name], male    DOB: [birth year], 60 y.o.   MRN: [id number]  HPI  Patient presents today with right-sided knee pain.  Recently went to the emergency room due to severe pain in the right knee.  He complains of pain over the lateral joint line.  It hurts severely to put weight on his knee.  There is no erythema.  There is a small effusion.  In the emergency room, he had x-rays that were negative for any fracture or significant degenerative joint disease.  Of note he had an MRI of the right knee in 2011 that did show an extensive lateral meniscal tear.  He never had it surgically corrected because, it started feeling better on its own.  He has periodically had cortisone injections in his knee that have helped in the past. Past Medical History:  Diagnosis Date  . Arthritis   . Asthma   . Chronic bronchitis ([place])   . GERD (gastroesophageal reflux disease)   . Hypercholesteremia   . Hypertension   . OSA treated with BiPAP    ahi-132, bipap 23/19   Past Surgical History:  Procedure Laterality Date  . ESOPHAGOGASTRODUODENOSCOPY (EGD) WITH PROPOFOL N/A 12/11/2012   Procedure: ESOPHAGOGASTRODUODENOSCOPY (EGD) WITH PROPOFOL;  Surgeon: Arta Silence, MD;  [facility];  Service: Endoscopy;  Laterality: N/A;  . FINGER AMPUTATION    . KNEE ARTHROSCOPY Left 11/15/2015   Procedure: ARTHROSCOPY KNEE AND MEDIAL MENISECTOMY;  Surgeon: Paralee Cancel, MD;  Location: [facility];  Service: Orthopedics;  Laterality: Left;   Current Outpatient Medications on File Prior to Visit  Medication Sig Dispense Refill  . AIMOVIG 70 MG/ML SOAJ INJECT 70 MG INTO THE SKIN EVERY 30 DAYS. 1 mL 3  . albuterol (VENTOLIN HFA) 108 (90 Base) MCG/ACT inhaler INHALE 2 PUFFS INTO THE LUNGS EVERY 4 HOURS AS NEEDED FOR SHORTNESS OF BREATH 8.5 g 2  . amitriptyline (ELAVIL) 50 MG tablet Take 1 tablet (50 mg total) by mouth at bedtime. 30 tablet 0  . amLODipine (NORVASC) 10 MG tablet TAKE 1 TABLET  BY MOUTH DAILY. 90 tablet 3  . aspirin 81 MG tablet Take 81 mg by mouth daily.    Marland Kitchen atorvastatin (LIPITOR) 10 MG tablet TAKE 1 TABLET BY MOUTH DAILY. 90 tablet 1  . Blood Glucose Monitoring Suppl (ACCU-CHEK AVIVA PLUS) w/Device KIT Check BS bid 1 kit 1  . fluticasone (FLONASE) 50 MCG/ACT nasal spray Place 1 spray into both nostrils daily. 16 g 2  . HYDROcodone-acetaminophen (NORCO) 5-325 MG tablet Take 1 tablet by mouth every 6 (six) hours as needed for moderate pain. 20 tablet 0  . meloxicam (MOBIC) 15 MG tablet Take 1 tablet (15 mg total) by mouth daily. 30 tablet 0  . metFORMIN (GLUCOPHAGE) 1000 MG tablet Take 1 tablet (1,000 mg total) by mouth 2 (two) times daily with a meal. 180 tablet 3  . mupirocin ointment (BACTROBAN) 2 % Apply 1 application topically 2 (two) times daily. 30 g 2  . nitroGLYCERIN (NITROSTAT) 0.4 MG SL tablet Place 1 tablet (0.4 mg total) under the tongue every 5 (five) minutes as needed. For chest pain 20 tablet 1  . omeprazole (PRILOSEC) 40 MG capsule TAKE 1 CAPSULE BY MOUTH DAILY. 90 capsule 3  . senna-docusate (SENOKOT S) 8.6-50 MG tablet Take 2 tablets by mouth at bedtime as needed for mild constipation. 60 tablet 2  . topiramate (TOPAMAX) 25 MG tablet Take 2 tablets (50 mg total)  by mouth 2 (two) times daily. 120 tablet 5  . UNABLE TO FIND Lidocaine ointment 5%    . urea (CARMOL) 40 % CREA Apply 1 application topically daily. 85 g 1  . valACYclovir (VALTREX) 1000 MG tablet Take 1 tablet (1,000 mg total) by mouth 3 (three) times daily. 21 tablet 0   Current Facility-Administered Medications on File Prior to Visit  Medication Dose Route Frequency Provider Last Rate Last Admin  . lactated ringers infusion    Continuous PRN Chyrel Masson, CRNA   New Bag at 11/15/15 1545   No Known Allergies Social History   Socioeconomic History  . Marital status: Divorced    Spouse name: Not on file  . Number of children: 0  . Years of education: Not on file  . Highest  education level: Not on file  Occupational History  . Not on file  Tobacco Use  . Smoking status: Current Every Day Smoker    Packs/day: 1.00    Years: 40.00    Pack years: 40.00    Types: Cigarettes  . Smokeless tobacco: Never Used  . Tobacco comment: 06/05/17 1 pack per 1 1/2 days  Substance and Sexual Activity  . Alcohol use: Yes    Alcohol/week: 0.0 standard drinks    Comment: some  . Drug use: Yes    Types: Cocaine    Comment: patient states none- read lab report 09/02/2012/ 11-20-12 case cx.due to positive screen test-pt. advised no recreational drugs to be used  . Sexual activity: Not on file  Other Topics Concern  . Not on file  Social History Narrative   Lives with brother and his sister in [place]     Social Determinants of Health   Financial Resource Strain: Not on file  Food Insecurity: Not on file  Transportation Needs: Not on file  Physical Activity: Not on file  Stress: Not on file  Social Connections: Not on file  Intimate Partner Violence: Not on file     Review of Systems  All other systems reviewed and are negative.      Objective:   Physical Exam  Cardiovascular: Normal rate, regular rhythm and normal heart sounds.   Pulmonary/Chest: Effort normal and breath sounds normal.  Musculoskeletal:       Right knee: He exhibits bony tenderness. He exhibits normal range of motion, no swelling, positive  effusion, no erythema, no LCL laxity, normal patellar mobility, painful meniscus(+ Apley grind) and no MCL laxity. Tenderness found along lateral joint line.            Assessment & Plan:   Chronic pain of right knee  Patient has occasional pain in the right knee.  This is been a chronic problem since 2011 and I believe stems from his underlying meniscal tear.  At the present time he has no desire for surgical correction.  Therefore we will try cortisone injection.  Using sterile technique, I injected the joint space with 2 cc lidocaine, 2 cc of Marcaine,  and 2 cc of 40 mg/mL Kenalog.  Patient tolerated the procedure well without complication.  I did give the patient oxycodone/acetaminophen, 7.5/325 1 p.o. every 6 hours as needed pain.  If the pain does not improve, I would obtain an MRI of the knee to determine the extent of the meniscal tear and consult orthopedics.

## 2020-05-31 ENCOUNTER — Telehealth: Payer: Self-pay | Admitting: Family Medicine

## 2020-05-31 NOTE — Telephone Encounter (Signed)
Kenneth [name] states that Dr. Dennard Schaumann was supposed to send a referral to an orthopedic for his knee.  CB# [phone]

## 2020-06-02 NOTE — Telephone Encounter (Signed)
Per chart notes on 05/11/2020: If the pain does not improve, I would obtain an MRI of the knee to determine the extent of the meniscal tear and consult orthopedics.   Call placed to patient. No answer. No VM.

## 2020-06-03 NOTE — Telephone Encounter (Signed)
Call placed to patient sister Stanton Kidney to inquire.   Reports that patient continues to have increased pain and decreased ROM to knee. States that they would like to proceed with MRI.   Please advise.

## 2020-06-04 ENCOUNTER — Other Ambulatory Visit: Payer: Self-pay | Admitting: Family Medicine

## 2020-06-04 DIAGNOSIS — S83203D Other tear of unspecified meniscus, current injury, right knee, subsequent encounter: Secondary | ICD-10-CM

## 2020-06-04 NOTE — Telephone Encounter (Signed)
I ordered mri

## 2020-06-08 DIAGNOSIS — G4733 Obstructive sleep apnea (adult) (pediatric): Secondary | ICD-10-CM | POA: Diagnosis not present

## 2020-06-24 ENCOUNTER — Telehealth: Payer: Self-pay | Admitting: Family Medicine

## 2020-06-24 MED ORDER — ACCU-CHEK AVIVA PLUS W/DEVICE KIT: PACK | 1 refills | Status: AC

## 2020-06-24 NOTE — Telephone Encounter (Signed)
Prescription sent to pharmacy.

## 2020-06-24 NOTE — Telephone Encounter (Signed)
Pt wife called stating the pt is in need of a new Blood Glucose Monitoring Suppl (ACCU-CHEK AVIVA PLUS) w/Device KIT  Pt current monitor is broken.  Cb#: [phone]

## 2020-06-25 ENCOUNTER — Other Ambulatory Visit: Payer: Self-pay

## 2020-06-25 ENCOUNTER — Ambulatory Visit
Admission: RE | Admit: 2020-06-25 | Discharge: 2020-06-25 | Disposition: A | Discharge status: Home / Self Care | Payer: Medicare Other | Source: Ambulatory Visit | Attending: Family Medicine | Admitting: Family Medicine

## 2020-06-25 DIAGNOSIS — S83203D Other tear of unspecified meniscus, current injury, right knee, subsequent encounter: Secondary | ICD-10-CM

## 2020-06-25 DIAGNOSIS — M25561 Pain in right knee: Secondary | ICD-10-CM | POA: Diagnosis not present

## 2020-06-29 ENCOUNTER — Other Ambulatory Visit: Payer: Self-pay | Admitting: *Deleted

## 2020-06-29 DIAGNOSIS — S83282S Other tear of lateral meniscus, current injury, left knee, sequela: Secondary | ICD-10-CM

## 2020-07-05 ENCOUNTER — Telehealth: Payer: Self-pay | Admitting: Family Medicine

## 2020-07-05 NOTE — Progress Notes (Signed)
  Chronic Care Management   Note  07/05/2020 Name: Kenneth [name] MRN: [id number] DOB: 1960-07-12  Kenneth [name] is a 60 y.o. year old male who is a primary care patient of [name], MD. I reached out to [name] by phone today in response to a referral sent by [name]'s PCP, [name], MD.   [name] was given information about Chronic Care Management services today including:  1. CCM service includes personalized support from designated clinical staff supervised by his physician, including individualized plan of care and coordination with other care providers 2. 24/7 contact phone numbers for assistance for urgent and routine care needs. 3. Service will only be billed when office clinical staff spend 20 minutes or more in a month to coordinate care. 4. Only one practitioner may furnish and bill the service in a calendar month. 5. The patient may stop CCM services at any time (effective at the end of the month) by phone call to the office staff.   Patient agreed to services and verbal consent obtained.   Follow up plan:   [name] [employer] Scheduler

## 2020-07-07 ENCOUNTER — Ambulatory Visit (INDEPENDENT_AMBULATORY_CARE_PROVIDER_SITE_OTHER): Payer: Medicare Other | Admitting: Orthopaedic Surgery

## 2020-07-07 ENCOUNTER — Other Ambulatory Visit: Payer: Self-pay

## 2020-07-07 ENCOUNTER — Ambulatory Visit: Payer: Self-pay

## 2020-07-07 DIAGNOSIS — S83281A Other tear of lateral meniscus, current injury, right knee, initial encounter: Secondary | ICD-10-CM

## 2020-07-07 DIAGNOSIS — M1712 Unilateral primary osteoarthritis, left knee: Secondary | ICD-10-CM | POA: Diagnosis not present

## 2020-07-07 MED ORDER — BUPIVACAINE HCL 0.5 % IJ SOLN
2.0000 mL | INTRAMUSCULAR | Status: AC | PRN
  Administered 2020-07-07: 2 mL via INTRA_ARTICULAR

## 2020-07-07 MED ORDER — LIDOCAINE HCL 1 % IJ SOLN
2.0000 mL | INTRAMUSCULAR | Status: AC | PRN
  Administered 2020-07-07: 2 mL

## 2020-07-07 MED ORDER — METHYLPREDNISOLONE ACETATE 40 MG/ML IJ SUSP
40.0000 mg | INTRAMUSCULAR | Status: AC | PRN
  Administered 2020-07-07: 40 mg via INTRA_ARTICULAR

## 2020-07-07 NOTE — Progress Notes (Signed)
Office Visit Note   Patient: Kenneth [name]           Date of Birth: 18-Nov-1960           MRN: [id number] Visit Date: 07/07/2020              Requested by: [name], MD [address] [place],  [place] [address] PCP: [name], MD   Assessment & Plan: Visit Diagnoses:  1. Primary osteoarthritis of left knee   2. Acute lateral meniscus tear of right knee, initial encounter     Plan: For the left knee impression is moderate DJD.  He has had a prior partial medial meniscectomy.  His symptoms are consistent with degenerative joint disease.  He requested cortisone injection today.  He understands that he needs to lose weight to help with the knee pain and that he will likely need knee replacement in the future.  For the right knee impression is symptomatic lateral meniscal tear.  He has minimal chondromalacia.  Recommendation is for arthroscopic partial lateral meniscectomy and chondroplasty.  Risk benefits rehab recovery reviewed with the patient in detail.  Follow-Up Instructions: Return if symptoms worsen or fail to improve.   Orders:  Orders Placed This Encounter  Procedures  . Large Joint Inj  . XR KNEE 3 VIEW LEFT   No orders of the defined types were placed in this encounter.     Procedures: Large Joint Inj: L knee on 07/07/2020 11:31 AM Details: 22 G needle Medications: 2 mL bupivacaine 0.5 %; 2 mL lidocaine 1 %; 40 mg methylPREDNISolone acetate 40 MG/ML Outcome: tolerated well, no immediate complications Patient was prepped and draped in the usual sterile fashion.       Clinical Data: No additional findings.   Subjective: Chief Complaint  Patient presents with  . Right Knee - Pain  . Left Knee - Pain    Kenneth [name] is a 60 year old gentleman who comes in for evaluation of bilateral knee pain.  In terms of the left knee he is having chronic pain related to DJD.  He has had a prior knee arthroscopy with partial medial meniscectomy by Dr. Alvan Dame  about 4 years ago.  He has chronic aching pain that is worse with daily activities and standing.  He has had prior cortisone injections with good relief and is requesting another one today.  He denies any mechanical symptoms.  For the right knee he is having chronic right knee pain and recently he had which showed a large lateral meniscus tear.  He endorses chronic pain throughout the knee and worse in the lateral side.  Also has pain with daily activities.   Review of Systems  Constitutional: Negative.   All other systems reviewed and are negative.    Objective: Vital Signs: There were no vitals taken for this visit.  Physical Exam Vitals and nursing note reviewed.  Constitutional:      Appearance: He is well-developed.  HENT:     Head: Normocephalic and atraumatic.  Eyes:     Pupils: Pupils are equal, round, and reactive to light.  Pulmonary:     Effort: Pulmonary effort is normal.  Abdominal:     Palpations: Abdomen is soft.  Musculoskeletal:        General: Normal range of motion.     Cervical back: Neck supple.  Skin:    General: Skin is warm.  Neurological:     Mental Status: He is alert and oriented  to person, place, and time.  Psychiatric:        Behavior: Behavior normal.        Thought Content: Thought content normal.        Judgment: Judgment normal.     Ortho Exam Left knee shows fully healed surgical scars.  1+ crepitus with range of motion.  Collaterals and cruciates are stable.  No joint effusion.  Right knee shows lateral joint line tenderness with McMurray testing.  Small joint effusion.  Pain with flexion of the knee past 90 degrees.  Collaterals and cruciates are stable. Specialty Comments:  No specialty comments available.  Imaging: XR KNEE 3 VIEW LEFT  Result Date: 07/07/2020 Moderate degenerative on the left knee.    PMFS History: Patient Active Problem List   Diagnosis Date Noted  . Acute lateral meniscus tear of right knee 07/07/2020  .  DM type 2 (diabetes mellitus, type 2) ([place]) 02/25/2019  . OSA treated with BiPAP   . Syncope 05/13/2014  . Asthma   . Knee pain   . Chest pain 10/23/2011  . Dysphagia 10/23/2011  . Hyperlipidemia 10/23/2011  . Obesity 10/23/2011   Past Medical History:  Diagnosis Date  . Arthritis   . Asthma   . Chronic bronchitis ([place])   . GERD (gastroesophageal reflux disease)   . Hypercholesteremia   . Hypertension   . OSA treated with BiPAP    ahi-132, bipap 23/19    Family History  Problem Relation Age of Onset  . Hypertension Father   . Diabetes Father   . Diabetes Other        Multiple family members  . Diabetes Sister   . Diabetes Brother   . Stroke Sister     Past Surgical History:  Procedure Laterality Date  . ESOPHAGOGASTRODUODENOSCOPY (EGD) WITH PROPOFOL N/A 12/11/2012   Procedure: ESOPHAGOGASTRODUODENOSCOPY (EGD) WITH PROPOFOL;  Surgeon: Arta Silence, MD;  [facility];  Service: Endoscopy;  Laterality: N/A;  . FINGER AMPUTATION    . KNEE ARTHROSCOPY Left 11/15/2015   Procedure: ARTHROSCOPY KNEE AND MEDIAL MENISECTOMY;  Surgeon: Paralee Cancel, MD;  Location: [facility];  Service: Orthopedics;  Laterality: Left;   Social History   Occupational History  . Not on file  Tobacco Use  . Smoking status: Current Every Day Smoker    Packs/day: 1.00    Years: 40.00    Pack years: 40.00    Types: Cigarettes  . Smokeless tobacco: Never Used  . Tobacco comment: 06/05/17 1 pack per 1 1/2 days  Substance and Sexual Activity  . Alcohol use: Yes    Alcohol/week: 0.0 standard drinks    Comment: some  . Drug use: Yes    Types: Cocaine    Comment: patient states none- read lab report 09/02/2012/ 11-20-12 case cx.due to positive screen test-pt. advised no recreational drugs to be used  . Sexual activity: Not on file

## 2020-07-12 ENCOUNTER — Other Ambulatory Visit (HOSPITAL_COMMUNITY): Payer: Medicare Other

## 2020-07-12 ENCOUNTER — Telehealth: Payer: Self-pay | Admitting: Orthopaedic Surgery

## 2020-07-12 NOTE — Telephone Encounter (Signed)
Patient called and put his niece Lenore on the phone.   She will be providing transportation Wednesday July 14, 2020 . She wants to know what level of care patient will need after his right knee partial lateral meniscectomy. Also she wants to know patient's down time and when patient will be able to drive. Please advise.    cb  [phone] Wenda Low)

## 2020-07-13 ENCOUNTER — Other Ambulatory Visit: Payer: Self-pay

## 2020-07-13 ENCOUNTER — Other Ambulatory Visit (HOSPITAL_COMMUNITY)
Admission: RE | Admit: 2020-07-13 | Discharge: 2020-07-13 | Disposition: A | Discharge status: Home / Self Care | Payer: Medicare Other | Source: Ambulatory Visit | Attending: Orthopaedic Surgery | Admitting: Orthopaedic Surgery

## 2020-07-13 ENCOUNTER — Encounter (HOSPITAL_COMMUNITY): Payer: Self-pay | Admitting: Orthopaedic Surgery

## 2020-07-13 DIAGNOSIS — Z01812 Encounter for preprocedural laboratory examination: Secondary | ICD-10-CM | POA: Insufficient documentation

## 2020-07-13 DIAGNOSIS — Z20822 Contact with and (suspected) exposure to covid-19: Secondary | ICD-10-CM | POA: Diagnosis not present

## 2020-07-13 LAB — SARS CORONAVIRUS 2 (TAT 6-24 HRS): SARS Coronavirus 2: NEGATIVE

## 2020-07-13 NOTE — Progress Notes (Signed)
Kenneth [name] denies chest pain or shortness of breath. Patient was tested for Covid today and has been in quarantine since that time.  Kenneth [name] has type II diabetes, patient reports that CBGs run 135-145. I instructed patient to not take [place] in am.  I instructed patient to check CBG after awaking and every 2 hours until arrival  to the hospital. I Instructed patient if CBG is less than 70 to drink 1/2 cup of a clear juice. Recheck CBG in 15 minutes if CBG is not over 70 call, pre- op desk at [phone] for further instructions.

## 2020-07-13 NOTE — Telephone Encounter (Signed)
Please advise 

## 2020-07-13 NOTE — Telephone Encounter (Signed)
He will be provided with crutches to use for couple days but should he may weight-bear and ambulate immediately.  He will have some soreness but he will not need to be on bedrest or anything like that.  He mainly just needs some help for the first 24 hours after surgery.

## 2020-07-13 NOTE — Telephone Encounter (Signed)
IC no answer. LMVM advising of below per Dr Erlinda Hong

## 2020-07-14 ENCOUNTER — Encounter (HOSPITAL_COMMUNITY)
Admission: RE | Disposition: A | Discharge status: Home / Self Care | Payer: Self-pay | Source: Home / Self Care | Attending: Orthopaedic Surgery

## 2020-07-14 ENCOUNTER — Ambulatory Visit (HOSPITAL_COMMUNITY): Payer: Medicare Other | Admitting: Anesthesiology

## 2020-07-14 ENCOUNTER — Encounter (HOSPITAL_COMMUNITY): Payer: Self-pay | Admitting: Orthopaedic Surgery

## 2020-07-14 ENCOUNTER — Encounter: Payer: Self-pay | Admitting: Orthopaedic Surgery

## 2020-07-14 ENCOUNTER — Other Ambulatory Visit: Payer: Self-pay

## 2020-07-14 ENCOUNTER — Ambulatory Visit (HOSPITAL_COMMUNITY)
Admission: RE | Admit: 2020-07-14 | Discharge: 2020-07-14 | Disposition: A | Discharge status: Home / Self Care | Payer: Medicare Other | Attending: Orthopaedic Surgery | Admitting: Orthopaedic Surgery

## 2020-07-14 DIAGNOSIS — Z833 Family history of diabetes mellitus: Secondary | ICD-10-CM | POA: Diagnosis not present

## 2020-07-14 DIAGNOSIS — G4733 Obstructive sleep apnea (adult) (pediatric): Secondary | ICD-10-CM | POA: Insufficient documentation

## 2020-07-14 DIAGNOSIS — M94261 Chondromalacia, right knee: Secondary | ICD-10-CM | POA: Insufficient documentation

## 2020-07-14 DIAGNOSIS — Z8249 Family history of ischemic heart disease and other diseases of the circulatory system: Secondary | ICD-10-CM | POA: Diagnosis not present

## 2020-07-14 DIAGNOSIS — Z89029 Acquired absence of unspecified finger(s): Secondary | ICD-10-CM | POA: Diagnosis not present

## 2020-07-14 DIAGNOSIS — E7800 Pure hypercholesterolemia, unspecified: Secondary | ICD-10-CM | POA: Diagnosis not present

## 2020-07-14 DIAGNOSIS — S83281A Other tear of lateral meniscus, current injury, right knee, initial encounter: Secondary | ICD-10-CM | POA: Diagnosis not present

## 2020-07-14 DIAGNOSIS — Z79899 Other long term (current) drug therapy: Secondary | ICD-10-CM | POA: Insufficient documentation

## 2020-07-14 DIAGNOSIS — Z7984 Long term (current) use of oral hypoglycemic drugs: Secondary | ICD-10-CM | POA: Diagnosis not present

## 2020-07-14 DIAGNOSIS — X58XXXA Exposure to other specified factors, initial encounter: Secondary | ICD-10-CM | POA: Insufficient documentation

## 2020-07-14 DIAGNOSIS — Z7982 Long term (current) use of aspirin: Secondary | ICD-10-CM | POA: Diagnosis not present

## 2020-07-14 DIAGNOSIS — I10 Essential (primary) hypertension: Secondary | ICD-10-CM | POA: Diagnosis not present

## 2020-07-14 DIAGNOSIS — F17210 Nicotine dependence, cigarettes, uncomplicated: Secondary | ICD-10-CM | POA: Diagnosis not present

## 2020-07-14 DIAGNOSIS — E119 Type 2 diabetes mellitus without complications: Secondary | ICD-10-CM | POA: Diagnosis not present

## 2020-07-14 HISTORY — DX: Headache, unspecified: R51.9

## 2020-07-14 HISTORY — PX: KNEE ARTHROSCOPY WITH LATERAL MENISECTOMY: SHX6193

## 2020-07-14 HISTORY — DX: Type 2 diabetes mellitus without complications: E11.9

## 2020-07-14 LAB — CBC
HCT: 49.9 % (ref 39.0–52.0)
Hemoglobin: 16 g/dL (ref 13.0–17.0)
MCH: 26.8 pg (ref 26.0–34.0)
MCHC: 32.1 g/dL (ref 30.0–36.0)
MCV: 83.7 fL (ref 80.0–100.0)
Platelets: 349 10*3/uL (ref 150–400)
RBC: 5.96 MIL/uL — ABNORMAL HIGH (ref 4.22–5.81)
RDW: 17.8 % — ABNORMAL HIGH (ref 11.5–15.5)
WBC: 9.3 10*3/uL (ref 4.0–10.5)
nRBC: 0 % (ref 0.0–0.2)

## 2020-07-14 LAB — GLUCOSE, CAPILLARY
Glucose-Capillary: 139 mg/dL — ABNORMAL HIGH (ref 70–99)
Glucose-Capillary: 121 mg/dL — ABNORMAL HIGH (ref 70–99)
Glucose-Capillary: 126 mg/dL — ABNORMAL HIGH (ref 70–99)

## 2020-07-14 LAB — POCT I-STAT, CHEM 8
BUN: 16 mg/dL (ref 6–20)
Calcium, Ion: 1.19 mmol/L (ref 1.15–1.40)
Chloride: 104 mmol/L (ref 98–111)
Creatinine, Ser: 0.7 mg/dL (ref 0.61–1.24)
Glucose, Bld: 121 mg/dL — ABNORMAL HIGH (ref 70–99)
HCT: 48 % (ref 39.0–52.0)
Hemoglobin: 16.3 g/dL (ref 13.0–17.0)
Potassium: 4 mmol/L (ref 3.5–5.1)
Sodium: 140 mmol/L (ref 135–145)
TCO2: 27 mmol/L (ref 22–32)

## 2020-07-14 SURGERY — ARTHROSCOPY, KNEE, WITH LATERAL MENISCECTOMY
Anesthesia: General | Site: Knee | Laterality: Right

## 2020-07-14 MED ORDER — CHLORHEXIDINE GLUCONATE 0.12 % MT SOLN
15.0000 mL | Freq: Once | OROMUCOSAL | Status: AC
  Administered 2020-07-14: 15 mL via OROMUCOSAL
  Filled 2020-07-14: qty 15

## 2020-07-14 MED ORDER — LACTATED RINGERS IV SOLN: INTRAVENOUS | Status: DC

## 2020-07-14 MED ORDER — PHENYLEPHRINE 40 MCG/ML (10ML) SYRINGE FOR IV PUSH (FOR BLOOD PRESSURE SUPPORT)
PREFILLED_SYRINGE | INTRAVENOUS | Status: AC
  Filled 2020-07-14: qty 10

## 2020-07-14 MED ORDER — LIDOCAINE 2% (20 MG/ML) 5 ML SYRINGE
INTRAMUSCULAR | Status: AC
  Filled 2020-07-14: qty 15

## 2020-07-14 MED ORDER — ONDANSETRON HCL 4 MG/2ML IJ SOLN
INTRAMUSCULAR | Status: DC | PRN
  Administered 2020-07-14: 4 mg via INTRAVENOUS

## 2020-07-14 MED ORDER — ACETAMINOPHEN 500 MG PO TABS
1000.0000 mg | ORAL_TABLET | Freq: Once | ORAL | Status: AC
  Administered 2020-07-14: 1000 mg via ORAL
  Filled 2020-07-14: qty 2

## 2020-07-14 MED ORDER — ORAL CARE MOUTH RINSE: 15.0000 mL | Freq: Once | OROMUCOSAL | Status: AC

## 2020-07-14 MED ORDER — LIDOCAINE 2% (20 MG/ML) 5 ML SYRINGE
INTRAMUSCULAR | Status: DC | PRN
  Administered 2020-07-14: 80 mg via INTRAVENOUS

## 2020-07-14 MED ORDER — HYDROCODONE-ACETAMINOPHEN 5-325 MG PO TABS: 1.0000 | ORAL_TABLET | Freq: Two times a day (BID) | ORAL | 0 refills | Status: DC | PRN

## 2020-07-14 MED ORDER — ROCURONIUM BROMIDE 10 MG/ML (PF) SYRINGE
PREFILLED_SYRINGE | INTRAVENOUS | Status: DC | PRN
  Administered 2020-07-14: 1 mg via INTRAVENOUS
  Administered 2020-07-14: 70 mg via INTRAVENOUS

## 2020-07-14 MED ORDER — ONDANSETRON HCL 4 MG/2ML IJ SOLN
INTRAMUSCULAR | Status: AC
  Filled 2020-07-14: qty 6

## 2020-07-14 MED ORDER — SUGAMMADEX SODIUM 200 MG/2ML IV SOLN
INTRAVENOUS | Status: DC | PRN
  Administered 2020-07-14: 400 mg via INTRAVENOUS

## 2020-07-14 MED ORDER — MIDAZOLAM HCL 5 MG/5ML IJ SOLN
INTRAMUSCULAR | Status: DC | PRN
  Administered 2020-07-14: 2 mg via INTRAVENOUS

## 2020-07-14 MED ORDER — PROPOFOL 10 MG/ML IV BOLUS
INTRAVENOUS | Status: DC | PRN
  Administered 2020-07-14: 200 mg via INTRAVENOUS

## 2020-07-14 MED ORDER — DEXAMETHASONE SODIUM PHOSPHATE 10 MG/ML IJ SOLN
INTRAMUSCULAR | Status: AC
  Filled 2020-07-14: qty 3

## 2020-07-14 MED ORDER — CEFAZOLIN IN SODIUM CHLORIDE 3-0.9 GM/100ML-% IV SOLN
3.0000 g | INTRAVENOUS | Status: AC
  Administered 2020-07-14: 3 g via INTRAVENOUS
  Filled 2020-07-14: qty 100

## 2020-07-14 MED ORDER — SUCCINYLCHOLINE CHLORIDE 200 MG/10ML IV SOSY
PREFILLED_SYRINGE | INTRAVENOUS | Status: AC
  Filled 2020-07-14: qty 10

## 2020-07-14 MED ORDER — MIDAZOLAM HCL 2 MG/2ML IJ SOLN
INTRAMUSCULAR | Status: AC
  Filled 2020-07-14: qty 2

## 2020-07-14 MED ORDER — FENTANYL CITRATE (PF) 100 MCG/2ML IJ SOLN
INTRAMUSCULAR | Status: DC | PRN
  Administered 2020-07-14: 100 ug via INTRAVENOUS

## 2020-07-14 MED ORDER — POVIDONE-IODINE 10 % EX SWAB
2.0000 "application " | Freq: Once | CUTANEOUS | Status: AC
  Administered 2020-07-14: 2 via TOPICAL

## 2020-07-14 MED ORDER — BUPIVACAINE HCL (PF) 0.25 % IJ SOLN
INTRAMUSCULAR | Status: AC
  Filled 2020-07-14: qty 30

## 2020-07-14 MED ORDER — SODIUM CHLORIDE 0.9 % IR SOLN
Status: DC | PRN
  Administered 2020-07-14 (×2): 3000 mL

## 2020-07-14 MED ORDER — PHENYLEPHRINE 40 MCG/ML (10ML) SYRINGE FOR IV PUSH (FOR BLOOD PRESSURE SUPPORT)
PREFILLED_SYRINGE | INTRAVENOUS | Status: DC | PRN
  Administered 2020-07-14 (×2): 80 ug via INTRAVENOUS

## 2020-07-14 MED ORDER — BUPIVACAINE HCL (PF) 0.25 % IJ SOLN
INTRAMUSCULAR | Status: DC | PRN
  Administered 2020-07-14: 20 mL

## 2020-07-14 MED ORDER — PHENYLEPHRINE HCL-NACL 10-0.9 MG/250ML-% IV SOLN
INTRAVENOUS | Status: DC | PRN
  Administered 2020-07-14: 25 ug/min via INTRAVENOUS

## 2020-07-14 MED ORDER — ONDANSETRON HCL 4 MG PO TABS: 4.0000 mg | ORAL_TABLET | Freq: Three times a day (TID) | ORAL | 0 refills | Status: DC | PRN

## 2020-07-14 MED ORDER — ROCURONIUM BROMIDE 10 MG/ML (PF) SYRINGE
PREFILLED_SYRINGE | INTRAVENOUS | Status: AC
  Filled 2020-07-14: qty 30

## 2020-07-14 MED ORDER — POVIDONE-IODINE 7.5 % EX SOLN: Freq: Once | CUTANEOUS | Status: DC

## 2020-07-14 MED ORDER — HYDROMORPHONE HCL 1 MG/ML IJ SOLN: 0.2500 mg | INTRAMUSCULAR | Status: DC | PRN

## 2020-07-14 MED ORDER — 0.9 % SODIUM CHLORIDE (POUR BTL) OPTIME
TOPICAL | Status: DC | PRN
  Administered 2020-07-14: 1000 mL

## 2020-07-14 MED ORDER — DEXAMETHASONE SODIUM PHOSPHATE 10 MG/ML IJ SOLN
INTRAMUSCULAR | Status: DC | PRN
  Administered 2020-07-14: 10 mg via INTRAVENOUS

## 2020-07-14 MED ORDER — PROPOFOL 10 MG/ML IV BOLUS
INTRAVENOUS | Status: AC
  Filled 2020-07-14: qty 20

## 2020-07-14 MED ORDER — FENTANYL CITRATE (PF) 250 MCG/5ML IJ SOLN
INTRAMUSCULAR | Status: AC
  Filled 2020-07-14: qty 5

## 2020-07-14 SURGICAL SUPPLY — 46 items
BANDAGE ESMARK 6X9 LF (GAUZE/BANDAGES/DRESSINGS) ×1 IMPLANT
BLADE CLIPPER SURG (BLADE) IMPLANT
BLADE EXCALIBUR 4.0X13 (MISCELLANEOUS) IMPLANT
BLADE SURG 11 STRL SS (BLADE) IMPLANT
BNDG ELASTIC 6X10 VLCR STRL LF (GAUZE/BANDAGES/DRESSINGS) ×2 IMPLANT
BNDG ELASTIC 6X5.8 VLCR STR LF (GAUZE/BANDAGES/DRESSINGS) ×2 IMPLANT
BNDG ESMARK 6X9 LF (GAUZE/BANDAGES/DRESSINGS) ×2
COOLER ICEMAN CLASSIC (MISCELLANEOUS) ×2 IMPLANT
COVER SURGICAL LIGHT HANDLE (MISCELLANEOUS) ×2 IMPLANT
COVER WAND RF STERILE (DRAPES) ×2 IMPLANT
CUFF TOURN SGL QUICK 34 (TOURNIQUET CUFF)
CUFF TOURN SGL QUICK 42 (TOURNIQUET CUFF) IMPLANT
CUFF TRNQT CYL 34X4.125X (TOURNIQUET CUFF) IMPLANT
DRAPE ARTHROSCOPY W/POUCH 114 (DRAPES) ×2 IMPLANT
DRAPE SURG 17X23 STRL (DRAPES) ×4 IMPLANT
DRAPE U-SHAPE 47X51 STRL (DRAPES) ×2 IMPLANT
DRSG PAD ABDOMINAL 8X10 ST (GAUZE/BANDAGES/DRESSINGS) IMPLANT
DRSG XEROFORM 1X8 (GAUZE/BANDAGES/DRESSINGS) ×2 IMPLANT
DURAPREP 26ML APPLICATOR (WOUND CARE) ×2 IMPLANT
ELECT CAUTERY BLADE 6.4 (BLADE) IMPLANT
EXCALIBUR 3.8MM X 13CM (MISCELLANEOUS) ×2 IMPLANT
FACESHIELD WRAPAROUND (MASK) ×2 IMPLANT
GAUZE SPONGE 4X4 12PLY STRL (GAUZE/BANDAGES/DRESSINGS) ×2 IMPLANT
GAUZE XEROFORM 1X8 LF (GAUZE/BANDAGES/DRESSINGS) ×2 IMPLANT
GLOVE ECLIPSE 7.0 STRL STRAW (GLOVE) ×2 IMPLANT
GLOVE SKINSENSE NS SZ7.5 (GLOVE) ×2
GLOVE SKINSENSE STRL SZ7.5 (GLOVE) ×2 IMPLANT
GLOVE SURG UNDER POLY LF SZ7 (GLOVE) ×2 IMPLANT
GOWN STRL REIN XL XLG (GOWN DISPOSABLE) ×2 IMPLANT
KIT TURNOVER KIT B (KITS) ×2 IMPLANT
MANIFOLD NEPTUNE II (INSTRUMENTS) IMPLANT
NS IRRIG 1000ML POUR BTL (IV SOLUTION) IMPLANT
PACK ARTHROSCOPY DSU (CUSTOM PROCEDURE TRAY) ×2 IMPLANT
PAD ABD 8X10 STRL (GAUZE/BANDAGES/DRESSINGS) ×2 IMPLANT
PAD ARMBOARD 7.5X6 YLW CONV (MISCELLANEOUS) ×4 IMPLANT
PAD COLD SHLDR WRAP-ON (PAD) ×2 IMPLANT
PADDING CAST COTTON 6X4 STRL (CAST SUPPLIES) ×2 IMPLANT
PADDING CAST SYNTHETIC 4 (CAST SUPPLIES) ×1
PADDING CAST SYNTHETIC 4X4 STR (CAST SUPPLIES) ×1 IMPLANT
PORT APPOLLO RF 90DEGREE MULTI (SURGICAL WAND) IMPLANT
SPONGE LAP 4X18 RFD (DISPOSABLE) ×2 IMPLANT
SUT ETHILON 3 0 PS 1 (SUTURE) ×2 IMPLANT
TOWEL GREEN STERILE (TOWEL DISPOSABLE) ×2 IMPLANT
TOWEL GREEN STERILE FF (TOWEL DISPOSABLE) ×2 IMPLANT
TUBING ARTHROSCOPY IRRIG 16FT (MISCELLANEOUS) ×2 IMPLANT
WATER STERILE IRR 1000ML POUR (IV SOLUTION) ×2 IMPLANT

## 2020-07-14 NOTE — Transfer of Care (Signed)
Immediate Anesthesia Transfer of Care Note  Patient: Kenneth [name]  Procedure(s) Performed: RIGHT KNEE PARTIAL LATERAL MENISCECTOMY (Right Knee)  Patient Location: PACU  Anesthesia Type:General  Level of Consciousness: awake, alert  and oriented  Airway & Oxygen Therapy: Patient Spontanous Breathing and Patient connected to face mask oxygen  Post-op Assessment: Report given to RN and Post -op Vital signs reviewed and stable  Post vital signs: Reviewed and stable  Last Vitals:  Vitals Value Taken Time  BP 128/80 07/14/20 1518  Temp    Pulse 80 07/14/20 1521  Resp 29 07/14/20 1521  SpO2 94 % 07/14/20 1521  Vitals shown include unvalidated device data.  Last Pain:  Vitals:   07/14/20 1154  TempSrc:   PainSc: 4       Patients Stated Pain Goal: 4 (70/62/37 6283)  Complications: No complications documented.

## 2020-07-14 NOTE — Progress Notes (Signed)
Lab called and stated BMP sample hemolyzed. Dr. Ola Spurr notified. Verbal order received for I-stat instead.

## 2020-07-14 NOTE — Anesthesia Procedure Notes (Signed)
Procedure Name: Intubation Date/Time: 07/14/2020 2:28 PM Performed by: Candis Shine, CRNA Pre-anesthesia Checklist: Patient identified, Emergency Drugs available, Suction available and Patient being monitored Patient Re-evaluated:Patient Re-evaluated prior to induction Oxygen Delivery Method: Circle System Utilized Preoxygenation: Pre-oxygenation with 100% oxygen Induction Type: IV induction Ventilation: Mask ventilation with difficulty, Two handed mask ventilation required and Oral airway inserted - appropriate to patient size Laryngoscope Size: Glidescope and 4 Grade View: Grade I Tube type: Oral Tube size: 7.5 mm Number of attempts: 1 Airway Equipment and Method: Video-laryngoscopy,  Rigid stylet and Oral airway Placement Confirmation: ETT inserted through vocal cords under direct vision,  positive ETCO2 and breath sounds checked- equal and bilateral Secured at: 23 cm Tube secured with: Tape Dental Injury: Teeth and Oropharynx as per pre-operative assessment  Difficulty Due To: Difficulty was anticipated Comments: Elective glidescope intubation d/t body habitus.

## 2020-07-14 NOTE — Anesthesia Preprocedure Evaluation (Addendum)
Anesthesia Evaluation  Patient identified by MRN, date of birth, ID band Patient awake    Reviewed: Allergy & Precautions, H&P , NPO status , Patient's Chart, lab work & pertinent test results  Airway Mallampati: III  TM Distance: >3 FB Neck ROM: Full    Dental no notable dental hx. (+) Teeth Intact, Dental Advisory Given   Pulmonary asthma , sleep apnea and Continuous Positive Airway Pressure Ventilation , Current Smoker and Patient abstained from smoking.,    Pulmonary exam normal breath sounds clear to auscultation       Cardiovascular hypertension, Pt. on medications  Rhythm:Regular Rate:Normal     Neuro/Psych  Headaches, negative psych ROS   GI/Hepatic Neg liver ROS, GERD  Medicated,  Endo/Other  diabetesMorbid obesity  Renal/GU negative Renal ROS  negative genitourinary   Musculoskeletal  (+) Arthritis , Osteoarthritis,    Abdominal   Peds  Hematology negative hematology ROS (+)   Anesthesia Other Findings   Reproductive/Obstetrics negative OB ROS                            Anesthesia Physical Anesthesia Plan  ASA: III  Anesthesia Plan: General   Post-op Pain Management:    Induction: Intravenous  PONV Risk Score and Plan: 2 and Ondansetron, Dexamethasone and Midazolam  Airway Management Planned: Oral ETT  Additional Equipment:   Intra-op Plan:   Post-operative Plan: Extubation in OR  Informed Consent: I have reviewed the patients History and Physical, chart, labs and discussed the procedure including the risks, benefits and alternatives for the proposed anesthesia with the patient or authorized representative who has indicated his/her understanding and acceptance.     Dental advisory given  Plan Discussed with: CRNA  Anesthesia Plan Comments:         Anesthesia Quick Evaluation

## 2020-07-14 NOTE — H&P (Signed)
PREOPERATIVE H&P  Chief Complaint: right knee lateral meniscal tear  HPI: Kenneth [name] is a 60 y.o. male who presents for surgical treatment of right knee lateral meniscal tear.  He denies any changes in medical history.  Past Medical History:  Diagnosis Date  . Arthritis   . Asthma   . Chronic bronchitis ([place])   . Diabetes mellitus without complication ([place])    type II  . GERD (gastroesophageal reflux disease)   . Headache   . Hypercholesteremia   . Hypertension   . OSA treated with BiPAP    ahi-132, bipap 23/19   Past Surgical History:  Procedure Laterality Date  . ESOPHAGOGASTRODUODENOSCOPY (EGD) WITH PROPOFOL N/A 12/11/2012   Procedure: ESOPHAGOGASTRODUODENOSCOPY (EGD) WITH PROPOFOL;  Surgeon: Arta Silence, MD;  Location: [facility];  Service: Endoscopy;  Laterality: N/A;  . FINGER AMPUTATION    . KNEE ARTHROSCOPY Left 11/15/2015   Procedure: ARTHROSCOPY KNEE AND MEDIAL MENISECTOMY;  Surgeon: Paralee Cancel, MD;  Location: [facility];  Service: Orthopedics;  Laterality: Left;   Social History   Socioeconomic History  . Marital status: Divorced    Spouse name: Not on file  . Number of children: 0  . Years of education: Not on file  . Highest education level: Not on file  Occupational History  . Not on file  Tobacco Use  . Smoking status: Current Every Day Smoker    Packs/day: 0.50    Years: 40.00    Pack years: 20.00    Types: Cigarettes  . Smokeless tobacco: Never Used  . Tobacco comment: 06/05/17 1 pack per 1 1/2 days  Substance and Sexual Activity  . Alcohol use: Yes    Comment: 1 fith on the weekend - gin  . Drug use: Yes    Types: Cocaine    Comment: patient states none- read lab report 09/02/2012/ 11-20-12 case cx.due to positive screen test-pt. advised no recreational drugs to be used  . Sexual activity: Not on file  Other Topics Concern  . Not on file  Social History Narrative   Lives with brother and his sister in [place].     Social Determinants of  Health   Financial Resource Strain: Not on file  Food Insecurity: Not on file  Transportation Needs: Not on file  Physical Activity: Not on file  Stress: Not on file  Social Connections: Not on file   Family History  Problem Relation Age of Onset  . Hypertension Father   . Diabetes Father   . Diabetes Other        Multiple family members  . Diabetes Sister   . Diabetes Brother   . Stroke Sister    No Known Allergies Prior to Admission medications   Medication Sig Start Date End Date Taking? Authorizing Provider  AIMOVIG 70 MG/ML SOAJ INJECT 70 MG INTO THE SKIN EVERY 30 DAYS. Patient taking differently: Inject 70 mg into the skin every 30 (thirty) days. 04/02/20  Yes [name], MD  albuterol (VENTOLIN HFA) 108 (90 Base) MCG/ACT inhaler INHALE 2 PUFFS INTO THE LUNGS EVERY 4 HOURS AS NEEDED FOR SHORTNESS OF BREATH Patient taking differently: Inhale 2 puffs into the lungs every 4 (four) hours as needed for wheezing or shortness of breath. 11/06/19  Yes [name], MD  amLODipine (NORVASC) 10 MG tablet TAKE 1 TABLET BY MOUTH DAILY. Patient taking differently: Take 10 mg by mouth in the morning. 02/10/20  Yes [name], MD  aspirin 81 MG  chewable tablet Chew 81 mg by mouth in the morning.   Yes [provider]  atorvastatin (LIPITOR) 10 MG tablet TAKE 1 TABLET BY MOUTH DAILY. Patient taking differently: Take 10 mg by mouth in the morning. 12/22/19  Yes Pickard, Cammie Mcgee, MD  Glycerin-Hypromellose-PEG 400 (VISINE DRY EYE) 0.2-0.2-1 % SOLN Place 1-2 drops into both eyes 3 (three) times daily as needed (dry/irritated eyes.).   Yes [provider]  metFORMIN (GLUCOPHAGE) 1000 MG tablet Take 1 tablet (1,000 mg total) by mouth 2 (two) times daily with a meal. 06/03/19  Yes Pickard, Cammie Mcgee, MD  mupirocin ointment (BACTROBAN) 2 % Apply 1 application topically 2 (two) times daily. Patient taking differently: Apply 1 application topically 2 (two) times daily as  needed (skin irritation). 03/06/19  Yes Trula Slade, DPM  neomycin-polymyxin b-dexamethasone (MAXITROL) 3.5-10000-0.1 SUSP Place 1 drop into the left eye in the morning, at noon, in the evening, and at bedtime.   Yes [provider]  nitroGLYCERIN (NITROSTAT) 0.4 MG SL tablet Place 1 tablet (0.4 mg total) under the tongue every 5 (five) minutes as needed. For chest pain Patient taking differently: Place 0.4 mg under the tongue every 5 (five) minutes x 3 doses as needed for chest pain. 03/01/15  Yes [place], Modena Nunnery, MD  omeprazole (PRILOSEC) 40 MG capsule TAKE 1 CAPSULE BY MOUTH DAILY. Patient taking differently: Take 40 mg by mouth in the morning. 03/22/20  Yes [name], MD  senna-docusate (SENNA-PLUS) 8.6-50 MG tablet Take 1 tablet by mouth daily as needed (constipation).   Yes [provider]  topiramate (TOPAMAX) 25 MG tablet Take 2 tablets (50 mg total) by mouth 2 (two) times daily. 08/07/19  Yes [name], MD  Blood Glucose Monitoring Suppl (ACCU-CHEK AVIVA PLUS) w/Device KIT Use as directed to monitor FSBS 2x daily. Dx: E11.9 06/24/20   [name], MD  fluticasone (FLONASE) 50 MCG/ACT nasal spray Place 1 spray into both nostrils daily. Patient not taking: Reported on 07/09/2020 05/04/17   Zigmund Gottron, NP  HYDROcodone-acetaminophen (NORCO) 5-325 MG tablet Take 1 tablet by mouth every 6 (six) hours as needed for moderate pain. Patient not taking: Reported on 07/09/2020 02/27/20   Alycia Rossetti, MD  oxyCODONE-acetaminophen (PERCOCET) 7.5-325 MG tablet Take 1 tablet by mouth every 4 (four) hours as needed for severe pain. Patient not taking: Reported on 07/09/2020 05/11/20   [name], MD  valACYclovir (VALTREX) 1000 MG tablet Take 1 tablet (1,000 mg total) by mouth 3 (three) times daily. Patient not taking: Reported on 07/09/2020 02/17/20   Veryl Speak, MD     Positive ROS: All other systems have been reviewed and were otherwise  negative with the exception of those mentioned in the HPI and as above.  Physical Exam: General: Alert, no acute distress Cardiovascular: No pedal edema Respiratory: No cyanosis, no use of accessory musculature GI: abdomen soft Skin: No lesions in the area of chief complaint Neurologic: Sensation intact distally Psychiatric: Patient is competent for consent with normal mood and affect Lymphatic: no lymphedema  MUSCULOSKELETAL: exam stable  Assessment: right knee lateral meniscal tear  Plan: Plan for Procedure(s): RIGHT KNEE PARTIAL LATERAL MENISCECTOMY  The risks benefits and alternatives were discussed with the patient including but not limited to the risks of nonoperative treatment, versus surgical intervention including infection, bleeding, nerve injury,  blood clots, cardiopulmonary complications, morbidity, mortality, among others, and they were willing to proceed.   Preoperative templating of the joint  replacement has been completed, documented, and submitted to the Operating Room personnel in order to optimize intra-operative equipment management.   Eduard Roux, MD 07/14/2020 11:18 AM

## 2020-07-14 NOTE — Progress Notes (Signed)
Orthopedic Tech Progress Note Patient Details:  [name] [birth year] [id number] PACU RN called requesting a PAIR OF CRUTCHES for patient Ortho Devices Type of Ortho Device: Crutches Ortho Device/Splint Interventions: Ordered,Application,Adjustment   Post Interventions Patient Tolerated: Well,Ambulated well Instructions Provided: Poper ambulation with device,Care of device   Janit Pagan 07/14/2020, 4:39 PM

## 2020-07-14 NOTE — Discharge Instructions (Signed)
Post-operative patient instructions  Knee Arthroscopy   . Ice:  Place intermittent ice or cooler pack over your knee, 30 minutes on and 30 minutes off.  Continue this for the first 72 hours after surgery, then save ice for use after therapy sessions or on more active days.   . Weight:  You may bear weight on your leg as your symptoms allow. . Crutches:  Use crutches (or walker) to assist in walking until told to discontinue by your physical therapist or physician. This will help to reduce pain. . Strengthening:  Perform simple thigh squeezes (isometric quad contractions) and straight leg lifts as you are able (3 sets of 5 to 10 repetitions, 3 times a day).  For the leg lifts, have someone support under your ankle in the beginning until you have increased strength enough to do this on your own.  To help get started on thigh squeezes, place a pillow under your knee and push down on the pillow with back of knee (sometimes easier to do than with your leg fully straight). . Motion:  Perform gentle knee motion as tolerated - this is gentle bending and straightening of the knee. Seated heel slides: you can start by sitting in a chair, remove your brace, and gently slide your heel back on the floor - allowing your knee to bend. Have someone help you straighten your knee (or use your other leg/foot hooked under your ankle.  . Dressing:  Perform 1st dressing change at 2 days postoperative. A moderate amount of blood tinged drainage is to be expected.  So if you bleed through the dressing on the first or second day or if you have fevers, it is fine to change the dressing/check the wounds early and redress wound. Elevate your leg.  If it bleeds through again, or if the incisions are leaking frank blood, please call the office. May change dressing every 1-2 days thereafter to help watch wounds. Can purchase Tegaderm (or 61M Nexcare) water resistant dressings at local pharmacy / [facility]. . Shower:  Light shower is  ok after 2 days.  Please take shower, NO bath. Recover with gauze and ace wrap to help keep wounds protected.   . Pain medication:  A narcotic pain medication has been prescribed.  Take as directed.  Typically you need narcotic pain medication more regularly during the first 3 to 5 days after surgery.  Decrease your use of the medication as the pain improves.  Narcotics can sometimes cause constipation, even after a few doses.  If you have problems with constipation, you can take an over the counter stool softener or light laxative.  If you have persistent problems, please notify your physician's office. Marland Kitchen Physical therapy: Additional activity guidelines to be provided by your physician or physical therapist at follow-up visits.  . Driving: Do not recommend driving x 2 weeks post surgical, especially if surgery performed on right side. Should not drive while taking narcotic pain medications. It typically takes at least 2 weeks to restore sufficient neuromuscular function for normal reaction times for driving safety.  . Call [phone] for questions or problems. Evenings you will be forwarded to the hospital operator.  Ask for the orthopaedic physician on call. Please call if you experience:    o Redness, foul smelling, or persistent drainage from the surgical site  o worsening knee pain and swelling not responsive to medication  o any calf pain and or swelling of the lower leg  o temperatures greater than  101.5 F o other questions or concerns   Thank you for allowing [name] to be a part of your care.

## 2020-07-14 NOTE — Anesthesia Postprocedure Evaluation (Signed)
Anesthesia Post Note  Patient: Kenneth [name]  Procedure(s) Performed: RIGHT KNEE PARTIAL LATERAL MENISCECTOMY (Right Knee)     Patient location during evaluation: PACU Anesthesia Type: General Level of consciousness: awake and alert Pain management: pain level controlled Vital Signs Assessment: post-procedure vital signs reviewed and stable Respiratory status: spontaneous breathing, nonlabored ventilation, respiratory function stable and patient connected to nasal cannula oxygen Cardiovascular status: blood pressure returned to baseline and stable Postop Assessment: no apparent nausea or vomiting Anesthetic complications: no   No complications documented.  Last Vitals:  Vitals:   07/14/20 1543 07/14/20 1545  BP: 134/76 131/76  Pulse: 78 84  Resp: (!) 24 (!) 23  Temp: (!) 36.3 C   SpO2: 93% 92%    Last Pain:  Vitals:   07/14/20 1543  TempSrc:   PainSc: 0-No pain                 Belenda Cruise P Chauncy Mangiaracina

## 2020-07-14 NOTE — Op Note (Signed)
   Surgery Date: 07/14/2020  PREOPERATIVE DIAGNOSES:  1. Right knee lateral meniscus tear 2. Right knee chondromalacia  POSTOPERATIVE DIAGNOSES:  same  PROCEDURES PERFORMED:  1. Right knee arthroscopy with limited synovectomy 2. Right knee arthroscopy with arthroscopic partial lateral meniscectomy  SURGEON: N. Eduard Roux, M.D.  ASSIST: Ciro Backer [place], [place]; necessary for the timely completion of procedure and due to complexity of procedure.  ANESTHESIA:  general  FLUIDS: Per anesthesia record.   ESTIMATED BLOOD LOSS: minimal  DESCRIPTION OF PROCEDURE: [name] is a 60 y.o.-year-old male with above mentioned conditions. Full discussion held regarding risks benefits alternatives and complications related surgical intervention. Conservative care options reviewed. All questions answered.  The patient was identified in the preoperative holding area and the operative extremity was marked. The patient was brought to the operating room and transferred to operating table in a supine position. Satisfactory general anesthesia was induced by anesthesiology.    Standard anterolateral, anteromedial arthroscopy portals were obtained. The anteromedial portal was obtained with a spinal needle for localization under direct visualization with subsequent diagnostic findings.   Limited synovectomy was performed for visualization.  Diagnostic knee arthroscopy revealed grade 1 changes in all 3 compartments.  Cruciates were unremarkable.  Minimal synovitis in the gutters.  The knee was then placed in a figure-of-four position to open up the lateral compartment.  There was a large displaced flap from the posterior horn that was entrapped in the joint.  Partial lateral meniscectomy was performed using oscillating shaver back to a stable border.  Approximately 25% of the volume was taken.  There was some degenerative fraying to the rest of the lateral meniscus.  Gutters were checked for loose bodies.   Excess fluid was removed from the knee joint.  Incisions were closed with interrupted nylon sutures.  Sterile dressings were applied.  Patient tolerated procedure well had no immediate complications.  Suprapatellar pouch and gutters: mild synovitis or debris. Patella chondral surface: Grade 1 Trochlear chondral surface: Grade 1 Patellofemoral tracking: normal Medial meniscus: normal.  Medial femoral condyle weight bearing surface: Grade 1 Medial tibial plateau: Grade 1 Anterior cruciate ligament:stable Posterior cruciate ligament:stable Lateral meniscus: displaced flap tear posterior horn.   Lateral femoral condyle weight bearing surface: Grade 1 Lateral tibial plateau: Grade 1  DISPOSITION: The patient was awakened from general anesthetic, extubated, taken to the recovery room in medically stable condition, no apparent complications. The patient may be weightbearing as tolerated to the operative lower extremity.  Range of motion of right knee as tolerated.  Azucena Cecil, MD [facility] 3:47 PM

## 2020-07-15 ENCOUNTER — Encounter (HOSPITAL_COMMUNITY): Payer: Self-pay | Admitting: Orthopaedic Surgery

## 2020-07-21 ENCOUNTER — Encounter: Payer: Self-pay | Admitting: Orthopaedic Surgery

## 2020-07-21 ENCOUNTER — Ambulatory Visit (INDEPENDENT_AMBULATORY_CARE_PROVIDER_SITE_OTHER): Payer: Medicare Other | Admitting: Physician Assistant

## 2020-07-21 DIAGNOSIS — Z9889 Other specified postprocedural states: Secondary | ICD-10-CM

## 2020-07-21 NOTE — Progress Notes (Signed)
Post-Op Visit Note   Patient: Kenneth [name]           Date of Birth: [birth year]           MRN: [id number] Visit Date: 07/21/2020 PCP: [name], MD   Assessment & Plan:  Chief Complaint:  Chief Complaint  Patient presents with  . Right Knee - Pain   Visit Diagnoses:  1. S/P right knee arthroscopy     Plan: *Patient is a pleasant 60 year old gentleman who comes in today 1 week out right knee arthroscopic debridement lateral meniscus and synovectomy.  He has been doing well.  He is ambulating with a single crutch at times.  He is no longer taking narcotic pain medication.  Examination of his right knee reveals 2 well healed surgical portals without complication.  Calf soft nontender.  He is neurovascular intact distally.  Today, sutures were removed and Steri-Strips applied.  Range of motion exercises provided.  Intraoperative pictures reviewed.  He will continue to advance with activity over the next 4 to 6 weeks and follow-up with [name] in 5 weeks time for repeat evaluation.  Call with concerns or questions in meantime.  Follow-Up Instructions: Return in about 5 weeks (around 08/25/2020).   Orders:  No orders of the defined types were placed in this encounter.  No orders of the defined types were placed in this encounter.   Imaging: No new imaging  PMFS History: Patient Active Problem List   Diagnosis Date Noted  . Acute lateral meniscus tear of right knee 07/07/2020  . DM type 2 (diabetes mellitus, type 2) ([place]) 02/25/2019  . OSA treated with BiPAP   . Syncope 05/13/2014  . Asthma   . Knee pain   . Chest pain 10/23/2011  . Dysphagia 10/23/2011  . Hyperlipidemia 10/23/2011  . Obesity 10/23/2011   Past Medical History:  Diagnosis Date  . Arthritis   . Asthma   . Chronic bronchitis ([place])   . Diabetes mellitus without complication ([place])    type II  . GERD (gastroesophageal reflux disease)   . Headache   . Hypercholesteremia   . Hypertension   . OSA treated  with BiPAP    ahi-132, bipap 23/19    Family History  Problem Relation Age of Onset  . Hypertension Father   . Diabetes Father   . Diabetes Other        Multiple family members  . Diabetes Sister   . Diabetes Brother   . Stroke Sister     Past Surgical History:  Procedure Laterality Date  . ESOPHAGOGASTRODUODENOSCOPY (EGD) WITH PROPOFOL N/A 12/11/2012   Procedure: ESOPHAGOGASTRODUODENOSCOPY (EGD) WITH PROPOFOL;  Surgeon: Arta Silence, MD;  Location: [facility];  Service: Endoscopy;  Laterality: N/A;  . FINGER AMPUTATION    . KNEE ARTHROSCOPY Left 11/15/2015   Procedure: ARTHROSCOPY KNEE AND MEDIAL MENISECTOMY;  Surgeon: Paralee Cancel, MD;  Location: [facility];  Service: Orthopedics;  Laterality: Left;  . KNEE ARTHROSCOPY WITH LATERAL MENISECTOMY Right 07/14/2020   Procedure: RIGHT KNEE PARTIAL LATERAL MENISCECTOMY;  Surgeon: Leandrew Koyanagi, MD;  Location: [facility];  Service: Orthopedics;  Laterality: Right;   Social History   Occupational History  . Not on file  Tobacco Use  . Smoking status: Current Every Day Smoker    Packs/day: 0.50    Years: 40.00    Pack years: 20.00    Types: Cigarettes  . Smokeless tobacco: Never Used  . Tobacco comment: 06/05/17 1 pack per 1 1/2  days  Substance and Sexual Activity  . Alcohol use: Yes    Comment: occasionally  . Drug use: Yes    Types: Cocaine    Comment: patient states none- read lab report 09/02/2012/ 11-20-12 case cx.due to positive screen test-pt. advised no recreational drugs to be used  . Sexual activity: Not on file

## 2020-07-26 ENCOUNTER — Other Ambulatory Visit: Payer: Self-pay | Admitting: Family Medicine

## 2020-07-27 ENCOUNTER — Other Ambulatory Visit: Payer: Self-pay | Admitting: Family Medicine

## 2020-07-29 ENCOUNTER — Emergency Department (HOSPITAL_COMMUNITY): Payer: Medicare Other

## 2020-07-29 ENCOUNTER — Other Ambulatory Visit: Payer: Self-pay

## 2020-07-29 ENCOUNTER — Observation Stay (HOSPITAL_COMMUNITY)
Admission: EM | Admit: 2020-07-29 | Discharge: 2020-07-30 | Disposition: A | Discharge status: Home / Self Care | Payer: Medicare Other | Attending: Internal Medicine | Admitting: Internal Medicine

## 2020-07-29 ENCOUNTER — Encounter: Payer: Self-pay | Admitting: Physician Assistant

## 2020-07-29 ENCOUNTER — Encounter: Payer: Self-pay | Admitting: Family Medicine

## 2020-07-29 ENCOUNTER — Telehealth: Payer: Medicare Other | Admitting: Physician Assistant

## 2020-07-29 DIAGNOSIS — K219 Gastro-esophageal reflux disease without esophagitis: Secondary | ICD-10-CM | POA: Diagnosis present

## 2020-07-29 DIAGNOSIS — R079 Chest pain, unspecified: Secondary | ICD-10-CM | POA: Diagnosis not present

## 2020-07-29 DIAGNOSIS — S59902A Unspecified injury of left elbow, initial encounter: Secondary | ICD-10-CM | POA: Diagnosis not present

## 2020-07-29 DIAGNOSIS — S299XXA Unspecified injury of thorax, initial encounter: Secondary | ICD-10-CM | POA: Diagnosis not present

## 2020-07-29 DIAGNOSIS — S0990XA Unspecified injury of head, initial encounter: Secondary | ICD-10-CM

## 2020-07-29 DIAGNOSIS — J4520 Mild intermittent asthma, uncomplicated: Secondary | ICD-10-CM | POA: Diagnosis present

## 2020-07-29 DIAGNOSIS — R55 Syncope and collapse: Secondary | ICD-10-CM | POA: Diagnosis not present

## 2020-07-29 DIAGNOSIS — R918 Other nonspecific abnormal finding of lung field: Secondary | ICD-10-CM | POA: Diagnosis present

## 2020-07-29 DIAGNOSIS — I10 Essential (primary) hypertension: Secondary | ICD-10-CM | POA: Diagnosis present

## 2020-07-29 DIAGNOSIS — E119 Type 2 diabetes mellitus without complications: Secondary | ICD-10-CM

## 2020-07-29 DIAGNOSIS — I2510 Atherosclerotic heart disease of native coronary artery without angina pectoris: Secondary | ICD-10-CM | POA: Diagnosis not present

## 2020-07-29 DIAGNOSIS — G4733 Obstructive sleep apnea (adult) (pediatric): Secondary | ICD-10-CM | POA: Diagnosis present

## 2020-07-29 DIAGNOSIS — N3000 Acute cystitis without hematuria: Secondary | ICD-10-CM | POA: Diagnosis present

## 2020-07-29 DIAGNOSIS — R0602 Shortness of breath: Secondary | ICD-10-CM | POA: Diagnosis not present

## 2020-07-29 DIAGNOSIS — Z7982 Long term (current) use of aspirin: Secondary | ICD-10-CM | POA: Insufficient documentation

## 2020-07-29 DIAGNOSIS — R0789 Other chest pain: Secondary | ICD-10-CM | POA: Diagnosis not present

## 2020-07-29 DIAGNOSIS — S83281A Other tear of lateral meniscus, current injury, right knee, initial encounter: Secondary | ICD-10-CM | POA: Diagnosis not present

## 2020-07-29 DIAGNOSIS — E782 Mixed hyperlipidemia: Secondary | ICD-10-CM | POA: Diagnosis not present

## 2020-07-29 DIAGNOSIS — F17210 Nicotine dependence, cigarettes, uncomplicated: Secondary | ICD-10-CM | POA: Insufficient documentation

## 2020-07-29 DIAGNOSIS — M778 Other enthesopathies, not elsewhere classified: Secondary | ICD-10-CM | POA: Diagnosis not present

## 2020-07-29 DIAGNOSIS — I700 Atherosclerosis of aorta: Secondary | ICD-10-CM | POA: Diagnosis not present

## 2020-07-29 DIAGNOSIS — N62 Hypertrophy of breast: Secondary | ICD-10-CM | POA: Diagnosis not present

## 2020-07-29 DIAGNOSIS — Z7984 Long term (current) use of oral hypoglycemic drugs: Secondary | ICD-10-CM | POA: Insufficient documentation

## 2020-07-29 DIAGNOSIS — Z20822 Contact with and (suspected) exposure to covid-19: Secondary | ICD-10-CM | POA: Diagnosis not present

## 2020-07-29 DIAGNOSIS — Z87898 Personal history of other specified conditions: Secondary | ICD-10-CM | POA: Diagnosis present

## 2020-07-29 DIAGNOSIS — E1169 Type 2 diabetes mellitus with other specified complication: Secondary | ICD-10-CM | POA: Diagnosis not present

## 2020-07-29 DIAGNOSIS — R519 Headache, unspecified: Secondary | ICD-10-CM | POA: Diagnosis not present

## 2020-07-29 DIAGNOSIS — Z79899 Other long term (current) drug therapy: Secondary | ICD-10-CM | POA: Diagnosis not present

## 2020-07-29 LAB — URINALYSIS, ROUTINE W REFLEX MICROSCOPIC
Bilirubin Urine: NEGATIVE
Glucose, UA: NEGATIVE mg/dL
Ketones, ur: NEGATIVE mg/dL
Nitrite: POSITIVE — AB
Protein, ur: NEGATIVE mg/dL
Specific Gravity, Urine: 1.018 (ref 1.005–1.030)
pH: 6 (ref 5.0–8.0)

## 2020-07-29 LAB — BASIC METABOLIC PANEL
Anion gap: 7 (ref 5–15)
BUN: 17 mg/dL (ref 6–20)
CO2: 23 mmol/L (ref 22–32)
Calcium: 9.4 mg/dL (ref 8.9–10.3)
Chloride: 106 mmol/L (ref 98–111)
Creatinine, Ser: 0.83 mg/dL (ref 0.61–1.24)
GFR, Estimated: >60 mL/min (ref >60)
Glucose, Bld: 122 mg/dL — ABNORMAL HIGH (ref 70–99)
Potassium: 3.9 mmol/L (ref 3.5–5.1)
Sodium: 136 mmol/L (ref 135–145)

## 2020-07-29 LAB — CBC WITH DIFFERENTIAL/PLATELET
Abs Immature Granulocytes: 0.04 10*3/uL (ref 0.00–0.07)
Basophils Absolute: 0.1 10*3/uL (ref 0.0–0.1)
Basophils Relative: 1 %
Eosinophils Absolute: 0.2 10*3/uL (ref 0.0–0.5)
Eosinophils Relative: 2 %
HCT: 45 % (ref 39.0–52.0)
Hemoglobin: 14.2 g/dL (ref 13.0–17.0)
Immature Granulocytes: 1 %
Lymphocytes Relative: 37 %
Lymphs Abs: 3.2 10*3/uL (ref 0.7–4.0)
MCH: 26.7 pg (ref 26.0–34.0)
MCHC: 31.6 g/dL (ref 30.0–36.0)
MCV: 84.7 fL (ref 80.0–100.0)
Monocytes Absolute: 0.8 10*3/uL (ref 0.1–1.0)
Monocytes Relative: 9 %
Neutro Abs: 4.4 10*3/uL (ref 1.7–7.7)
Neutrophils Relative %: 50 %
Platelets: 311 10*3/uL (ref 150–400)
RBC: 5.31 MIL/uL (ref 4.22–5.81)
RDW: 16.4 % — ABNORMAL HIGH (ref 11.5–15.5)
WBC: 8.6 10*3/uL (ref 4.0–10.5)
nRBC: 0 % (ref 0.0–0.2)

## 2020-07-29 LAB — RESP PANEL BY RT-PCR (FLU A&B, COVID) ARPGX2
Influenza A by PCR: NEGATIVE
Influenza B by PCR: NEGATIVE
SARS Coronavirus 2 by RT PCR: NEGATIVE

## 2020-07-29 LAB — TROPONIN I (HIGH SENSITIVITY)
Troponin I (High Sensitivity): 5 ng/L (ref <18)
Troponin I (High Sensitivity): 4 ng/L (ref <18)

## 2020-07-29 LAB — BRAIN NATRIURETIC PEPTIDE: B Natriuretic Peptide: 7.5 pg/mL (ref 0.0–100.0)

## 2020-07-29 MED ORDER — ALBUTEROL SULFATE (2.5 MG/3ML) 0.083% IN NEBU: 2.5000 mg | INHALATION_SOLUTION | RESPIRATORY_TRACT | Status: DC | PRN

## 2020-07-29 MED ORDER — ENOXAPARIN SODIUM 60 MG/0.6ML IJ SOSY
60.0000 mg | PREFILLED_SYRINGE | INTRAMUSCULAR | Status: DC
  Administered 2020-07-30: 60 mg via SUBCUTANEOUS
  Filled 2020-07-29 (×2): qty 0.6

## 2020-07-29 MED ORDER — SODIUM CHLORIDE 0.9 % IV BOLUS
500.0000 mL | Freq: Once | INTRAVENOUS | Status: AC
  Administered 2020-07-29: 500 mL via INTRAVENOUS

## 2020-07-29 MED ORDER — INSULIN ASPART 100 UNIT/ML IJ SOLN
0.0000 [IU] | Freq: Three times a day (TID) | INTRAMUSCULAR | Status: DC
  Administered 2020-07-30: 3 [IU] via SUBCUTANEOUS

## 2020-07-29 MED ORDER — ONDANSETRON HCL 4 MG/2ML IJ SOLN: 4.0000 mg | Freq: Four times a day (QID) | INTRAMUSCULAR | Status: DC | PRN

## 2020-07-29 MED ORDER — ASPIRIN 81 MG PO CHEW
81.0000 mg | CHEWABLE_TABLET | Freq: Every morning | ORAL | Status: DC
  Administered 2020-07-30: 81 mg via ORAL
  Filled 2020-07-29: qty 1

## 2020-07-29 MED ORDER — NEOMYCIN-POLYMYXIN-DEXAMETH 3.5-10000-0.1 OP SUSP
1.0000 [drp] | Freq: Four times a day (QID) | OPHTHALMIC | Status: DC
  Filled 2020-07-29 (×2): qty 5

## 2020-07-29 MED ORDER — NITROGLYCERIN 0.4 MG SL SUBL: 0.4000 mg | SUBLINGUAL_TABLET | SUBLINGUAL | Status: DC | PRN

## 2020-07-29 MED ORDER — ALUM & MAG HYDROXIDE-SIMETH 200-200-20 MG/5ML PO SUSP: 30.0000 mL | Freq: Four times a day (QID) | ORAL | Status: DC | PRN

## 2020-07-29 MED ORDER — IOHEXOL 350 MG/ML SOLN
80.0000 mL | Freq: Once | INTRAVENOUS | Status: AC | PRN
  Administered 2020-07-29: 80 mL via INTRAVENOUS

## 2020-07-29 MED ORDER — ATORVASTATIN CALCIUM 10 MG PO TABS
10.0000 mg | ORAL_TABLET | Freq: Every morning | ORAL | Status: DC
  Administered 2020-07-30: 10 mg via ORAL
  Filled 2020-07-29: qty 1

## 2020-07-29 MED ORDER — SODIUM CHLORIDE 0.9 % IV SOLN
1.0000 g | INTRAVENOUS | Status: DC
  Filled 2020-07-29: qty 10

## 2020-07-29 MED ORDER — POLYVINYL ALCOHOL 1.4 % OP SOLN
1.0000 [drp] | Freq: Three times a day (TID) | OPHTHALMIC | Status: DC | PRN
  Filled 2020-07-29: qty 15

## 2020-07-29 MED ORDER — SODIUM CHLORIDE 0.9 % IV BOLUS: 1000.0000 mL | Freq: Once | INTRAVENOUS | Status: DC

## 2020-07-29 MED ORDER — SODIUM CHLORIDE 0.9 % IV SOLN
1.0000 g | Freq: Once | INTRAVENOUS | Status: AC
  Administered 2020-07-29: 1 g via INTRAVENOUS
  Filled 2020-07-29: qty 10

## 2020-07-29 MED ORDER — TOPIRAMATE 25 MG PO TABS
50.0000 mg | ORAL_TABLET | Freq: Two times a day (BID) | ORAL | Status: DC
  Administered 2020-07-29 – 2020-07-30 (×2): 50 mg via ORAL
  Filled 2020-07-29 (×2): qty 2

## 2020-07-29 MED ORDER — HYDROCODONE-ACETAMINOPHEN 5-325 MG PO TABS: 1.0000 | ORAL_TABLET | Freq: Two times a day (BID) | ORAL | Status: DC | PRN

## 2020-07-29 MED ORDER — ACETAMINOPHEN 325 MG PO TABS
650.0000 mg | ORAL_TABLET | ORAL | Status: DC | PRN
  Administered 2020-07-30: 650 mg via ORAL
  Filled 2020-07-29: qty 2

## 2020-07-29 MED ORDER — POLYETHYLENE GLYCOL 3350 17 G PO PACK
17.0000 g | PACK | Freq: Every day | ORAL | Status: DC | PRN
Start: 2020-07-29 — End: 2020-07-30

## 2020-07-29 MED ORDER — AMLODIPINE BESYLATE 10 MG PO TABS
10.0000 mg | ORAL_TABLET | Freq: Every morning | ORAL | Status: DC
  Administered 2020-07-30: 10 mg via ORAL
  Filled 2020-07-29: qty 1

## 2020-07-29 MED ORDER — MELATONIN 5 MG PO TABS
10.0000 mg | ORAL_TABLET | Freq: Every evening | ORAL | Status: DC | PRN
  Filled 2020-07-29: qty 2

## 2020-07-29 MED ORDER — ACETAMINOPHEN 325 MG PO TABS
650.0000 mg | ORAL_TABLET | Freq: Once | ORAL | Status: AC
  Administered 2020-07-29: 650 mg via ORAL
  Filled 2020-07-29: qty 2

## 2020-07-29 MED ORDER — PANTOPRAZOLE SODIUM 40 MG PO TBEC
40.0000 mg | DELAYED_RELEASE_TABLET | Freq: Every day | ORAL | Status: DC
  Administered 2020-07-30: 40 mg via ORAL
  Filled 2020-07-29: qty 1

## 2020-07-29 NOTE — Patient Instructions (Signed)
Syncope Syncope is when you pass out (faint) for a short time. It is caused by a sudden decrease in blood flow to the brain. Signs that you may be about to pass out include:  Feeling dizzy or light-headed.  Feeling sick to your stomach (nauseous).  Seeing all white or all black.  Having cold, clammy skin. If you pass out, get help right away. Call your local emergency services (911 in the [place] Do not drive yourself to the hospital. Follow these instructions at home: Watch for any changes in your symptoms. Take these actions to stay safe and help with your symptoms: Lifestyle  Do not drive, use machinery, or play sports until your doctor says it is okay.  Do not drink alcohol.  Do not use any products that contain nicotine or tobacco, such as cigarettes and e-cigarettes. If you need help quitting, ask your doctor.  Drink enough fluid to keep your pee (urine) pale yellow. General instructions  Take over-the-counter and prescription medicines only as told by your doctor.  If you are taking blood pressure or heart medicine, sit up and stand up slowly. Spend a few minutes getting ready to sit and then stand. This can help you feel less dizzy.  Have someone stay with you until you feel stable.  If you start to feel like you might pass out, lie down right away and raise (elevate) your feet above the level of your heart. Breathe deeply and steadily. Wait until all of the symptoms are gone.  Keep all follow-up visits as told by your doctor. This is important. Get help right away if:  You have a very bad headache.  You pass out once or more than once.  You have pain in your chest, belly, or back.  You have a very fast or uneven heartbeat (palpitations).  It hurts to breathe.  You are bleeding from your mouth or your bottom (rectum).  You have black or tarry poop (stool).  You have jerky movements that you cannot control (seizure).  You are confused.  You have trouble  walking.  You are very weak.  You have vision problems. These symptoms may be an emergency. Do not wait to see if the symptoms will go away. Get medical help right away. Call your local emergency services (911 in the [place] Do not drive yourself to the hospital. Summary  Syncope is when you pass out (faint) for a short time. It is caused by a sudden decrease in blood flow to the brain.  Signs that you may be about to faint include feeling dizzy, light-headed, or sick to your stomach, seeing all white or all black, or having cold, clammy skin.  If you start to feel like you might pass out, lie down right away and raise (elevate) your feet above the level of your heart. Breathe deeply and steadily. Wait until all of the symptoms are gone. This information is not intended to replace advice given to you by your health care provider. Make sure you discuss any questions you have with your health care provider. Document Revised: 04/23/2019 Document Reviewed: 04/25/2017 Elsevier Patient Education  2021 Elsevier Inc.  

## 2020-07-29 NOTE — ED Provider Notes (Signed)
[facility] DEPARTMENT Provider Note   CSN: [id number] Arrival date & time: 07/29/20  1130     History Chief Complaint  Patient presents with  . Chest Pain  . Loss of Consciousness    Kenneth [name] is a 60 y.o. male.  History of hypertension, type 2 diabetes, presenting for evaluation of syncope that occurred yesterday.  Patient states he was standing outside when he had a sudden onset of central sharp chest pain and then he soon passed out.  He fell and hit the back of his head on the ground, also hit his left elbow.  He was out briefly and came back to consciousness.  He states he syncopized years ago, however never had chest pain associated.  He does endorse some shortness of breath over the last 2 days as well.  He is feeling some lightheadedness when he stands up since yesterday.  He has been coughing for about a week, at 1 point he wondered if he saw blood in his sputum.  Denies new lower extremity swelling.  He had right meniscal repair by Dr. Erlinda Hong he said about 2 weeks ago.  It has been healing quite well, the swelling in his knee is gradually improving.  During the surgery he was not intubated, he states he was sedated and a nerve block was done.   No fevers, vomiting, diarrhea.  He endorses malodorous urine, as well as recurrent headache without double or loss of vision  History of DVT or PE.  Not on anticoagulation.  States he has not been sedentary since the surgery.   The history is provided by the patient.       Past Medical History:  Diagnosis Date  . Arthritis   . Asthma   . Chronic bronchitis ([place])   . Diabetes mellitus without complication ([place])    type II  . GERD (gastroesophageal reflux disease)   . Headache   . Hypercholesteremia   . Hypertension   . OSA treated with BiPAP    ahi-132, bipap 23/19    Patient Active Problem List   Diagnosis Date Noted  . Mixed diabetic hyperlipidemia associated with type 2 diabetes mellitus ([place])  07/29/2020  . Mild intermittent asthma without complication 12/75/1700  . Essential hypertension 07/29/2020  . GERD without esophagitis 07/29/2020  . Acute cystitis without hematuria 07/29/2020  . Bilateral pulmonary infiltrates on CXR 07/29/2020  . Acute lateral meniscus tear of right knee 07/07/2020  . Type 2 diabetes mellitus without complication, without long-term current use of insulin ([place]) 02/25/2019  . OSA treated with BiPAP   . Syncope 05/13/2014  . Asthma   . Knee pain   . Chest pain 10/23/2011  . Dysphagia 10/23/2011  . Obesity 10/23/2011    Past Surgical History:  Procedure Laterality Date  . ESOPHAGOGASTRODUODENOSCOPY (EGD) WITH PROPOFOL N/A 12/11/2012   Procedure: ESOPHAGOGASTRODUODENOSCOPY (EGD) WITH PROPOFOL;  Surgeon: Arta Silence, MD;  Location: [facility];  Service: Endoscopy;  Laterality: N/A;  . FINGER AMPUTATION    . KNEE ARTHROSCOPY Left 11/15/2015   Procedure: ARTHROSCOPY KNEE AND MEDIAL MENISECTOMY;  Surgeon: Paralee Cancel, MD;  Location: [facility];  Service: Orthopedics;  Laterality: Left;  . KNEE ARTHROSCOPY WITH LATERAL MENISECTOMY Right 07/14/2020   Procedure: RIGHT KNEE PARTIAL LATERAL MENISCECTOMY;  Surgeon: Leandrew Koyanagi, MD;  Location: [facility];  Service: Orthopedics;  Laterality: Right;       Family History  Problem Relation Age of Onset  . Hypertension Father   .  Diabetes Father   . Diabetes Other        Multiple family members  . Diabetes Sister   . Diabetes Brother   . Stroke Sister     Social History   Tobacco Use  . Smoking status: Current Every Day Smoker    Packs/day: 0.50    Years: 40.00    Pack years: 20.00    Types: Cigarettes  . Smokeless tobacco: Never Used  . Tobacco comment: 06/05/17 1 pack per 1 1/2 days  Substance Use Topics  . Alcohol use: Yes    Comment: occasionally  . Drug use: Yes    Types: Cocaine    Comment: patient states none- read lab report 09/02/2012/ 11-20-12 case cx.due to positive screen test-pt. advised  no recreational drugs to be used    Home Medications Prior to Admission medications   Medication Sig Start Date End Date Taking? Authorizing Provider  AIMOVIG 70 MG/ML SOAJ INJECT 70 MG INTO THE SKIN EVERY 30 DAYS. 07/27/20  Yes [name], MD  albuterol (VENTOLIN HFA) 108 (90 Base) MCG/ACT inhaler INHALE 2 PUFFS INTO THE LUNGS EVERY 4 HOURS AS NEEDED FOR SHORTNESS OF BREATH Patient taking differently: Inhale 2 puffs into the lungs every 4 (four) hours as needed for wheezing or shortness of breath. 11/06/19  Yes [name], MD  amLODipine (NORVASC) 10 MG tablet TAKE 1 TABLET BY MOUTH DAILY. Patient taking differently: Take 10 mg by mouth in the morning. 02/10/20  Yes [name], MD  aspirin 81 MG chewable tablet Chew 81 mg by mouth in the morning.   Yes [provider]  atorvastatin (LIPITOR) 10 MG tablet TAKE 1 TABLET BY MOUTH DAILY. Patient taking differently: Take 10 mg by mouth in the morning. 12/22/19  Yes Pickard, Cammie Mcgee, MD  Blood Glucose Monitoring Suppl (ACCU-CHEK AVIVA PLUS) w/Device KIT Use as directed to monitor FSBS 2x daily. Dx: E11.9 06/24/20  Yes [name], MD  Glycerin-Hypromellose-PEG 400 (VISINE DRY EYE) 0.2-0.2-1 % SOLN Place 1-2 drops into both eyes 3 (three) times daily as needed (dry/irritated eyes.).   Yes [provider]  HYDROcodone-acetaminophen (NORCO) 5-325 MG tablet Take 1-2 tablets by mouth 2 (two) times daily as needed. Patient taking differently: Take 1-2 tablets by mouth 2 (two) times daily as needed for moderate pain. 07/14/20  Yes Leandrew Koyanagi, MD  metFORMIN (GLUCOPHAGE) 1000 MG tablet TAKE 1 TABLET BY MOUTH 2 TIMES DAILY WITH A MEAL. Patient taking differently: Take 1,000 mg by mouth 2 (two) times daily with a meal. 07/27/20  Yes Pickard, Cammie Mcgee, MD  mupirocin ointment (BACTROBAN) 2 % Apply 1 application topically 2 (two) times daily. Patient taking differently: Apply 1 application topically 2 (two) times daily as  needed (skin irritation). 03/06/19  Yes Trula Slade, DPM  neomycin-polymyxin b-dexamethasone (MAXITROL) 3.5-10000-0.1 SUSP Place 1 drop into the left eye in the morning, at noon, in the evening, and at bedtime.   Yes [provider]  nitroGLYCERIN (NITROSTAT) 0.4 MG SL tablet Place 1 tablet (0.4 mg total) under the tongue every 5 (five) minutes as needed. For chest pain Patient taking differently: Place 0.4 mg under the tongue every 5 (five) minutes x 3 doses as needed for chest pain. 03/01/15  Yes Oblong, Modena Nunnery, MD  omeprazole (PRILOSEC) 40 MG capsule TAKE 1 CAPSULE BY MOUTH DAILY. Patient taking differently: Take 40 mg by mouth in the morning. 03/22/20  Yes [name], MD  ondansetron [facility]) 4  MG tablet Take 1-2 tablets (4-8 mg total) by mouth every 8 (eight) hours as needed for nausea or vomiting. 07/14/20  Yes Leandrew Koyanagi, MD  senna-docusate (SENOKOT-S) 8.6-50 MG tablet Take 1 tablet by mouth daily as needed (constipation).   Yes [provider]  topiramate (TOPAMAX) 25 MG tablet Take 2 tablets (50 mg total) by mouth 2 (two) times daily. 08/07/19  Yes [name], MD    Allergies    Patient has no known allergies.  Review of Systems   Review of Systems  Respiratory: Positive for cough and shortness of breath.   Cardiovascular: Positive for chest pain.  All other systems reviewed and are negative.   Physical Exam Updated Vital Signs BP (!) 103/58 (BP Location: Left Arm)   Pulse 71   Temp (!) 97.5 F (36.4 C) (Oral)   Resp 18   Ht '5\' 6"'  (1.676 m)   Wt 127 kg   SpO2 98%   BMI 45.19 kg/m   Physical Exam Vitals and nursing note reviewed.  Constitutional:      Appearance: He is well-developed.  HENT:     Head: Normocephalic.     Comments: Hematoma left parietal scalp Eyes:     Extraocular Movements: Extraocular movements intact.     Conjunctiva/sclera: Conjunctivae normal.     Pupils: Pupils are equal, round, and reactive to light.   Cardiovascular:     Rate and Rhythm: Normal rate and regular rhythm.     Pulses: Normal pulses.  Pulmonary:     Effort: Pulmonary effort is normal. No respiratory distress.     Breath sounds: Normal breath sounds.     Comments: Tachypneic Abdominal:     General: Bowel sounds are normal.     Palpations: Abdomen is soft.     Tenderness: There is no abdominal tenderness.  Musculoskeletal:     Comments: Right knee with laparoscopic surgical wounds appear to be healing well.  No erythema or warmth.  Minimal swelling noted to the joint.  No calf tenderness or significant swelling on the right.  Left lower extremity has no swelling Left olecranon process with bony tenderness.  No swelling or deformity.  Normal range of motion  Skin:    General: Skin is warm.  Neurological:     Mental Status: He is alert.  Psychiatric:        Mood and Affect: Mood normal.        Behavior: Behavior normal.     ED Results / Procedures / Treatments   Labs (all labs ordered are listed, but only abnormal results are displayed) Labs Reviewed  CBC WITH DIFFERENTIAL/PLATELET - Abnormal; Notable for the following components:      Result Value   RDW 16.4 (*)    All other components within normal limits  BASIC METABOLIC PANEL - Abnormal; Notable for the following components:   Glucose, Bld 122 (*)    All other components within normal limits  URINALYSIS, ROUTINE W REFLEX MICROSCOPIC - Abnormal; Notable for the following components:   APPearance HAZY (*)    Hgb urine dipstick SMALL (*)    Nitrite POSITIVE (*)    Leukocytes,Ua TRACE (*)    Bacteria, UA MANY (*)    All other components within normal limits  RESP PANEL BY RT-PCR (FLU A&B, COVID) ARPGX2  URINE CULTURE  BRAIN NATRIURETIC PEPTIDE  PROCALCITONIN  C-REACTIVE PROTEIN  HEMOGLOBIN A1C  HIV ANTIBODY (ROUTINE TESTING W REFLEX)  LIPID PANEL  TROPONIN I (HIGH SENSITIVITY)  TROPONIN I (HIGH SENSITIVITY)  TROPONIN I (HIGH SENSITIVITY)     EKG None  Radiology DG Chest 2 View  Result Date: 07/29/2020 CLINICAL DATA:  Syncope.  Chest pain. EXAM: CHEST - 2 VIEW COMPARISON:  07/20/2014. FINDINGS: Heart size normal. Diffuse bilateral interstitial prominence. Pneumonitis cannot be excluded. No pleural effusion or pneumothorax. IMPRESSION: Diffuse bilateral interstitial prominence. Pneumonitis cannot be excluded. Electronically Signed   By: Marcello Moores  Register   On: 07/29/2020 12:20   DG Elbow Complete Left  Result Date: 07/29/2020 CLINICAL DATA:  Stall. Left elbow injury and pain. Initial encounter. EXAM: LEFT ELBOW - COMPLETE 3+ VIEW COMPARISON:  None. FINDINGS: There is no evidence of fracture, dislocation, or joint effusion. There is no evidence of arthropathy. A small bone spur is seen arising from the olecranon process. No other osseous abnormality identified. Soft tissues are unremarkable. IMPRESSION: No acute findings. Electronically Signed   By: Marlaine Hind M.D.   On: 07/29/2020 19:18   CT Head Wo Contrast  Result Date: 07/29/2020 CLINICAL DATA:  Headache, new or worsening, posttraumatic. Additional history provided: Syncope with fall this morning. EXAM: CT HEAD WITHOUT CONTRAST TECHNIQUE: Contiguous axial images were obtained from the base of the skull through the vertex without intravenous contrast. COMPARISON:  Prior head CT examinations 02/17/2020 and earlier. FINDINGS: Brain: Cerebral volume is normal for age. There is no acute intracranial hemorrhage. No demarcated cortical infarct. No extra-axial fluid collection. No evidence of intracranial mass. No midline shift. Vascular: No hyperdense vessel.  Atherosclerotic calcifications. Skull: Normal. Negative for fracture or focal lesion. Sinuses/Orbits: Visualized orbits show no acute finding. Moderate mucosal thickening and large fluid level within the right maxillary sinus. Other: Parietal scalp hematoma eccentric to the left. IMPRESSION: No evidence of acute intracranial  abnormality. Parietal scalp hematoma eccentric to the left. Right maxillary sinusitis. Electronically Signed   By: Kellie Simmering DO   On: 07/29/2020 13:00   CT Angio Chest PE W/Cm &/Or Wo Cm  Result Date: 07/29/2020 CLINICAL DATA:  Suspected pulmonary embolism, sharp stabbing chest pain followed by syncopal episode, fell and struck back of head on concrete, history type II diabetes mellitus, hypertension, asthma EXAM: CT ANGIOGRAPHY CHEST WITH CONTRAST TECHNIQUE: Multidetector CT imaging of the chest was performed using the standard protocol during bolus administration of intravenous contrast. Multiplanar CT image reconstructions and MIPs were obtained to evaluate the vascular anatomy. CONTRAST:  22m OMNIPAQUE IOHEXOL 350 MG/ML SOLN IV COMPARISON:  04/27/2014 FINDINGS: Cardiovascular: Atherosclerotic calcifications aorta and coronary arteries. Aorta normal caliber without aneurysm or dissection. Heart unremarkable. No pericardial effusion. Small foci of air within the main pulmonary artery likely related to IV access. Pulmonary arteries well opacified and patent. No evidence of pulmonary embolism. Mediastinum/Nodes: Question minimal wall thickening of the distal esophagus, unchanged. Remainder of esophagus unremarkable. Base of cervical region normal appearance. No thoracic adenopathy. Asymmetric gynecomastia greater on LEFT Lungs/Pleura: Scattered patchy airspace infiltrates in both lungs,, greater peripherally, portions of which are present on the previous exam. This could represent recurrent infiltrate or edema, or progression of peripheral interstitial lung disease. No pleural effusion or pneumothorax. Upper Abdomen: Unremarkable Musculoskeletal: No acute osseous findings. Review of the MIP images confirms the above findings. IMPRESSION: No evidence of pulmonary embolism. Scattered patchy airspace infiltrates in both lungs question recurrent infiltrate or edema versus progression of predominately peripheral  interstitial lung disease. Aortic Atherosclerosis (ICD10-I70.0). Electronically Signed   By: MLavonia DanaM.D.   On: 07/29/2020 19:57    Procedures Procedures   Medications  Ordered in ED Medications  aspirin chewable tablet 81 mg (has no administration in time range)  HYDROcodone-acetaminophen (NORCO/VICODIN) 5-325 MG per tablet 1-2 tablet (has no administration in time range)  amLODipine (NORVASC) tablet 10 mg (has no administration in time range)  atorvastatin (LIPITOR) tablet 10 mg (has no administration in time range)  nitroGLYCERIN (NITROSTAT) SL tablet 0.4 mg (has no administration in time range)  pantoprazole (PROTONIX) EC tablet 40 mg (has no administration in time range)  topiramate (TOPAMAX) tablet 50 mg (50 mg Oral Given 07/29/20 2339)  albuterol (PROVENTIL) (2.5 MG/3ML) 0.083% nebulizer solution 2.5 mg (has no administration in time range)  polyvinyl alcohol (LIQUIFILM TEARS) 1.4 % ophthalmic solution 1-2 drop (has no administration in time range)  neomycin-polymyxin b-dexamethasone (MAXITROL) ophthalmic suspension 1 drop (has no administration in time range)  acetaminophen (TYLENOL) tablet 650 mg (has no administration in time range)  ondansetron (ZOFRAN) injection 4 mg (has no administration in time range)  insulin aspart (novoLOG) injection 0-15 Units (has no administration in time range)  enoxaparin (LOVENOX) injection 60 mg (has no administration in time range)  alum & mag hydroxide-simeth (MAALOX/MYLANTA) 200-200-20 MG/5ML suspension 30 mL (has no administration in time range)  polyethylene glycol (MIRALAX / GLYCOLAX) packet 17 g (has no administration in time range)  melatonin tablet 10 mg (has no administration in time range)  cefTRIAXone (ROCEPHIN) 1 g in sodium chloride 0.9 % 100 mL IVPB (has no administration in time range)  acetaminophen (TYLENOL) tablet 650 mg (650 mg Oral Given 07/29/20 1847)  iohexol (OMNIPAQUE) 350 MG/ML injection 80 mL (80 mLs Intravenous Contrast  Given 07/29/20 1925)  sodium chloride 0.9 % bolus 500 mL (0 mLs Intravenous Stopped 07/29/20 2154)  cefTRIAXone (ROCEPHIN) 1 g in sodium chloride 0.9 % 100 mL IVPB (0 g Intravenous Stopped 07/29/20 2154)    ED Course  I have reviewed the triage vital signs and the nursing notes.  Pertinent labs & imaging results that were available during my care of the patient were reviewed by me and considered in my medical decision making (see chart for details).    MDM Rules/Calculators/A&P                          Patient here for evaluation of syncope with sharp chest pain that preceded the syncopal episode yesterday.  Also endorses some shortness of breath since yesterday.  Endorses cough over the last week, intermittently productive, thought maybe he saw some blood in his sputum.  Had recent meniscal repair couple weeks ago by orthopedics.  Has been healing well.  Has been ambulatory since his surgery, no symptoms of DVT.  No signs of infection here.  In consideration of patient's presentation with chest pain, syncope, shortness of breath, recent surgery, there is concern for PE.  CTA is ordered and is negative for PE though does show multifocal findings about the lungs, considered infiltrates regarding his cough though also considered edema.  COVID swab is ordered.  He also reported malodorous urine, UA is consistent with infection.  Will cover with Rocephin.  Pending COVID swab, azithromycin not ordered for community-acquired pneumonia.  Believe patient would benefit from admission for further work-up, cardiac monitoring.  Discussed antibiotic coverage with admitting physician Dr. Marlyce Huge.  Agrees will hold azithromycin at this time, pending further work-up to determine if diagnosis of pneumonia. Final Clinical Impression(s) / ED Diagnoses Final diagnoses:  Syncope and collapse  Chest pain, unspecified type    Rx /  DC Orders ED Discharge Orders    None       Iviona Hole, [place] N, PA-C 07/30/20  0015    Malvin Johns, MD 07/30/20 1535

## 2020-07-29 NOTE — H&P (Addendum)
History and Physical    Kenneth [name]:[id number] DOB: 04/20/1960 DOA: 07/29/2020  PCP: [name], MD  Patient coming from: Home   Chief Complaint:  Chief Complaint  Patient presents with  . Chest Pain  . Loss of Consciousness     HPI:    60 year old male with past medical history of mild intermittent asthma, diabetes mellitus type 2, gastroesophageal reflux disease, obstructive sleep apnea on BiPAP, hyperlipidemia, hypertension and recent right meniscal repair in late April who presents to [facility] emergency department after experiencing an episode of syncope and chest discomfort.  Patient explains that yesterday afternoon.  Patient had proceeded to get up out of his car shortly after he rose from a seated position he suddenly began to experience chest discomfort.  The chest discomfort was moderate to severe in intensity, midsternal nonradiating and tight in quality.  Shortly after the onset of this chest discomfort the patient lost consciousness.  At the time the patient was with his brother who witnessed the event.  Patient states that his brother denied any associated seizure-like activity.  Patient reports that he immediately woke up after several seconds and denies any tongue biting self urination or self defecation.  Patient reports that with after he woke up he no longer felt any chest discomfort.  Upon further questioning patient states that for the past several weeks he has noticed that he has been quite lightheaded upon arising from a seated position.  Patient denies any recent illness.  Patient denies fevers, sick contacts, recent travel or confirmed contact with COVID-19 infection.  Patient denies any recent initiation of new prescribed antihypertensives.  Patient denies having a poor appetite and states that he drinks adequate fluids.  It is worth noting that the patient reports that for the past several days his urine has been rather foul-smelling associated  with mild lower abdominal pain.  Patient denies any specific hematuria or dysuria however or flank pain.  While the patient did not experience any more episodes of loss of consciousness the following morning his family insisted that he present to [facility] emergency department for evaluation.  Upon evaluation in the emergency department troponin and EKG were unremarkable.  Chest imaging did reveal questionable bilateral mild interstitial infiltrates.  COVID-19 testing is pending.  Urinalysis was found to be leukocyte and nitrate positive with 11-20 white blood cells per high-powered field.  Patient was given a dose of intravenous ceftriaxone.  Due to patient's episode of syncope with associated chest discomfort the hospitalist group has been called to assess the patient for admission to the hospital.   Review of Systems:   Review of Systems  Cardiovascular: Positive for chest pain.  Neurological: Positive for loss of consciousness.  All other systems reviewed and are negative.   Past Medical History:  Diagnosis Date  . Arthritis   . Asthma   . Chronic bronchitis ([place])   . Diabetes mellitus without complication ([place])    type II  . GERD (gastroesophageal reflux disease)   . Headache   . Hypercholesteremia   . Hypertension   . OSA treated with BiPAP    ahi-132, bipap 23/19    Past Surgical History:  Procedure Laterality Date  . ESOPHAGOGASTRODUODENOSCOPY (EGD) WITH PROPOFOL N/A 12/11/2012   Procedure: ESOPHAGOGASTRODUODENOSCOPY (EGD) WITH PROPOFOL;  Surgeon: Arta Silence, MD;  Location: [facility]COPY;  Service: Endoscopy;  Laterality: N/A;  . FINGER AMPUTATION    . KNEE ARTHROSCOPY Left 11/15/2015   Procedure: ARTHROSCOPY KNEE  AND MEDIAL MENISECTOMY;  Surgeon: Paralee Cancel, MD;  Location: [facility] ORS;  Service: Orthopedics;  Laterality: Left;  . KNEE ARTHROSCOPY WITH LATERAL MENISECTOMY Right 07/14/2020   Procedure: RIGHT KNEE PARTIAL LATERAL MENISCECTOMY;  Surgeon: Leandrew Koyanagi, MD;  Location: [place];  Service: Orthopedics;  Laterality: Right;     reports that he has been smoking cigarettes. He has a 20.00 pack-year smoking history. He has never used smokeless tobacco. He reports current alcohol use. He reports current drug use. Drug: Cocaine.  No Known Allergies  Family History  Problem Relation Age of Onset  . Hypertension Father   . Diabetes Father   . Diabetes Other        Multiple family members  . Diabetes Sister   . Diabetes Brother   . Stroke Sister      Prior to Admission medications   Medication Sig Start Date End Date Taking? Authorizing Provider  AIMOVIG 70 MG/ML SOAJ INJECT 70 MG INTO THE SKIN EVERY 30 DAYS. 07/27/20  Yes [name], MD  albuterol (VENTOLIN HFA) 108 (90 Base) MCG/ACT inhaler INHALE 2 PUFFS INTO THE LUNGS EVERY 4 HOURS AS NEEDED FOR SHORTNESS OF BREATH Patient taking differently: Inhale 2 puffs into the lungs every 4 (four) hours as needed for wheezing or shortness of breath. 11/06/19  Yes [name], MD  amLODipine (NORVASC) 10 MG tablet TAKE 1 TABLET BY MOUTH DAILY. Patient taking differently: Take 10 mg by mouth in the morning. 02/10/20  Yes [name], MD  aspirin 81 MG chewable tablet Chew 81 mg by mouth in the morning.   Yes [provider]  atorvastatin (LIPITOR) 10 MG tablet TAKE 1 TABLET BY MOUTH DAILY. Patient taking differently: Take 10 mg by mouth in the morning. 12/22/19  Yes Pickard, Cammie Mcgee, MD  Blood Glucose Monitoring Suppl (ACCU-CHEK AVIVA PLUS) w/Device KIT Use as directed to monitor FSBS 2x daily. Dx: E11.9 06/24/20  Yes [name], MD  Glycerin-Hypromellose-PEG 400 (VISINE DRY EYE) 0.2-0.2-1 % SOLN Place 1-2 drops into both eyes 3 (three) times daily as needed (dry/irritated eyes.).   Yes [provider]  HYDROcodone-acetaminophen (NORCO) 5-325 MG tablet Take 1-2 tablets by mouth 2 (two) times daily as needed. Patient taking differently: Take 1-2 tablets by mouth 2  (two) times daily as needed for moderate pain. 07/14/20  Yes Leandrew Koyanagi, MD  metFORMIN (GLUCOPHAGE) 1000 MG tablet TAKE 1 TABLET BY MOUTH 2 TIMES DAILY WITH A MEAL. Patient taking differently: Take 1,000 mg by mouth 2 (two) times daily with a meal. 07/27/20  Yes Pickard, Cammie Mcgee, MD  mupirocin ointment (BACTROBAN) 2 % Apply 1 application topically 2 (two) times daily. Patient taking differently: Apply 1 application topically 2 (two) times daily as needed (skin irritation). 03/06/19  Yes Trula Slade, DPM  neomycin-polymyxin b-dexamethasone (MAXITROL) 3.5-10000-0.1 SUSP Place 1 drop into the left eye in the morning, at noon, in the evening, and at bedtime.   Yes [provider]  nitroGLYCERIN (NITROSTAT) 0.4 MG SL tablet Place 1 tablet (0.4 mg total) under the tongue every 5 (five) minutes as needed. For chest pain Patient taking differently: Place 0.4 mg under the tongue every 5 (five) minutes x 3 doses as needed for chest pain. 03/01/15  Yes [place], Modena Nunnery, MD  omeprazole (PRILOSEC) 40 MG capsule TAKE 1 CAPSULE BY MOUTH DAILY. Patient taking differently: Take 40 mg by mouth in the morning. 03/22/20  Yes [name], MD  ondansetron (  ZOFRAN) 4 MG tablet Take 1-2 tablets (4-8 mg total) by mouth every 8 (eight) hours as needed for nausea or vomiting. 07/14/20  Yes Leandrew Koyanagi, MD  senna-docusate (SENOKOT-S) 8.6-50 MG tablet Take 1 tablet by mouth daily as needed (constipation).   Yes [provider]  topiramate (TOPAMAX) 25 MG tablet Take 2 tablets (50 mg total) by mouth 2 (two) times daily. 08/07/19  Yes [name], MD    Physical Exam: Vitals:   07/29/20 1851 07/29/20 2045 07/29/20 2145 07/29/20 2215  BP: (!) 91/47 (!) 111/57 (!) 105/56 (!) 111/49  Pulse: 72 73 72 76  Resp: (!) 22 (!) _0 Temp:      TempSrc:      SpO2: 96% 93% 98% 95%  Weight:      Height:        Constitutional: Awake alert and oriented x3, no associated distress.  Patient is  obese. Skin: no rashes, no lesions, good skin turgor noted. Eyes: Pupils are equally reactive to light.  No evidence of scleral icterus or conjunctival pallor.  ENMT: Moist mucous membranes noted.  Posterior pharynx clear of any exudate or lesions.   Neck: normal, supple, no masses, no thyromegaly.  No evidence of jugular venous distension.   Respiratory: clear to auscultation bilaterally, no wheezing, no crackles. Normal respiratory effort. No accessory muscle use.  Cardiovascular: Regular rate and rhythm, no murmurs / rubs / gallops. No extremity edema. 2+ pedal pulses. No carotid bruits.  Chest:   Nontender without crepitus or deformity.   Back:   Nontender without crepitus or deformity. Abdomen: Abdomen is protuberant but soft and nontender.  No evidence of intra-abdominal masses.  Positive bowel sounds noted in all quadrants.   Musculoskeletal: No joint deformity upper and lower extremities. Good ROM, no contractures. Normal muscle tone.  Neurologic: CN 2-12 grossly intact. Sensation intact.  Patient moving all 4 extremities spontaneously.  Patient is following all commands.  Patient is responsive to verbal stimuli.   Psychiatric: Patient exhibits normal mood with appropriate affect.  Patient seems to possess insight as to their current situation.     Labs on Admission: I have personally reviewed following labs and imaging studies -   CBC: Recent Labs  Lab 07/29/20 1156  WBC 8.6  NEUTROABS 4.4  HGB 14.2  HCT 45.0  MCV 84.7  PLT 854   Basic Metabolic Panel: Recent Labs  Lab 07/29/20 1156  NA 136  K 3.9  CL 106  CO2 23  GLUCOSE 122*  BUN 17  CREATININE 0.83  CALCIUM 9.4   GFR: Estimated Creatinine Clearance: 119.3 mL/min (by C-G formula based on SCr of 0.83 mg/dL). Liver Function Tests: No results for input(s): AST, ALT, ALKPHOS, BILITOT, PROT, ALBUMIN in the last 168 hours. No results for input(s): LIPASE, AMYLASE in the last 168 hours. No results for input(s):  AMMONIA in the last 168 hours. Coagulation Profile: No results for input(s): INR, PROTIME in the last 168 hours. Cardiac Enzymes: No results for input(s): CKTOTAL, CKMB, CKMBINDEX, TROPONINI in the last 168 hours. BNP (last 3 results) No results for input(s): PROBNP in the last 8760 hours. HbA1C: No results for input(s): HGBA1C in the last 72 hours. CBG: No results for input(s): GLUCAP in the last 168 hours. Lipid Profile: No results for input(s): CHOL, HDL, LDLCALC, TRIG, CHOLHDL, LDLDIRECT in the last 72 hours. Thyroid Function Tests: No results for input(s): TSH, T4TOTAL, FREET4, T3FREE, THYROIDAB in the last 72 hours. Anemia Panel:  No results for input(s): VITAMINB12, FOLATE, FERRITIN, TIBC, IRON, RETICCTPCT in the last 72 hours. Urine analysis:    Component Value Date/Time   COLORURINE YELLOW 07/29/2020 2010   APPEARANCEUR HAZY (A) 07/29/2020 2010   LABSPEC 1.018 07/29/2020 2010   PHURINE 6.0 07/29/2020 2010   GLUCOSEU NEGATIVE 07/29/2020 2010   HGBUR SMALL (A) 07/29/2020 2010   BILIRUBINUR NEGATIVE 07/29/2020 2010   [place] 07/29/2020 2010   PROTEINUR NEGATIVE 07/29/2020 2010   UROBILINOGEN 0.2 12/10/2010 0347   NITRITE POSITIVE (A) 07/29/2020 2010   LEUKOCYTESUR TRACE (A) 07/29/2020 2010    Radiological Exams on Admission - Personally Reviewed: DG Chest 2 View  Result Date: 07/29/2020 CLINICAL DATA:  Syncope.  Chest pain. EXAM: CHEST - 2 VIEW COMPARISON:  07/20/2014. FINDINGS: Heart size normal. Diffuse bilateral interstitial prominence. Pneumonitis cannot be excluded. No pleural effusion or pneumothorax. IMPRESSION: Diffuse bilateral interstitial prominence. Pneumonitis cannot be excluded. Electronically Signed   By: Marcello Moores  Register   On: 07/29/2020 12:20   DG Elbow Complete Left  Result Date: 07/29/2020 CLINICAL DATA:  Stall. Left elbow injury and pain. Initial encounter. EXAM: LEFT ELBOW - COMPLETE 3+ VIEW COMPARISON:  None. FINDINGS: There is no evidence  of fracture, dislocation, or joint effusion. There is no evidence of arthropathy. A small bone spur is seen arising from the olecranon process. No other osseous abnormality identified. Soft tissues are unremarkable. IMPRESSION: No acute findings. Electronically Signed   By: Marlaine Hind M.D.   On: 07/29/2020 19:18   CT Head Wo Contrast  Result Date: 07/29/2020 CLINICAL DATA:  Headache, new or worsening, posttraumatic. Additional history provided: Syncope with fall this morning. EXAM: CT HEAD WITHOUT CONTRAST TECHNIQUE: Contiguous axial images were obtained from the base of the skull through the vertex without intravenous contrast. COMPARISON:  Prior head CT examinations 02/17/2020 and earlier. FINDINGS: Brain: Cerebral volume is normal for age. There is no acute intracranial hemorrhage. No demarcated cortical infarct. No extra-axial fluid collection. No evidence of intracranial mass. No midline shift. Vascular: No hyperdense vessel.  Atherosclerotic calcifications. Skull: Normal. Negative for fracture or focal lesion. Sinuses/Orbits: Visualized orbits show no acute finding. Moderate mucosal thickening and large fluid level within the right maxillary sinus. Other: Parietal scalp hematoma eccentric to the left. IMPRESSION: No evidence of acute intracranial abnormality. Parietal scalp hematoma eccentric to the left. Right maxillary sinusitis. Electronically Signed   By: Kellie Simmering DO   On: 07/29/2020 13:00   CT Angio Chest PE W/Cm &/Or Wo Cm  Result Date: 07/29/2020 CLINICAL DATA:  Suspected pulmonary embolism, sharp stabbing chest pain followed by syncopal episode, fell and struck back of head on concrete, history type II diabetes mellitus, hypertension, asthma EXAM: CT ANGIOGRAPHY CHEST WITH CONTRAST TECHNIQUE: Multidetector CT imaging of the chest was performed using the standard protocol during bolus administration of intravenous contrast. Multiplanar CT image reconstructions and MIPs were obtained to  evaluate the vascular anatomy. CONTRAST:  29m OMNIPAQUE IOHEXOL 350 MG/ML SOLN IV COMPARISON:  04/27/2014 FINDINGS: Cardiovascular: Atherosclerotic calcifications aorta and coronary arteries. Aorta normal caliber without aneurysm or dissection. Heart unremarkable. No pericardial effusion. Small foci of air within the main pulmonary artery likely related to IV access. Pulmonary arteries well opacified and patent. No evidence of pulmonary embolism. Mediastinum/Nodes: Question minimal wall thickening of the distal esophagus, unchanged. Remainder of esophagus unremarkable. Base of cervical region normal appearance. No thoracic adenopathy. Asymmetric gynecomastia greater on LEFT Lungs/Pleura: Scattered patchy airspace infiltrates in both lungs,, greater peripherally, portions of which are  present on the previous exam. This could represent recurrent infiltrate or edema, or progression of peripheral interstitial lung disease. No pleural effusion or pneumothorax. Upper Abdomen: Unremarkable Musculoskeletal: No acute osseous findings. Review of the MIP images confirms the above findings. IMPRESSION: No evidence of pulmonary embolism. Scattered patchy airspace infiltrates in both lungs question recurrent infiltrate or edema versus progression of predominately peripheral interstitial lung disease. Aortic Atherosclerosis (ICD10-I70.0). Electronically Signed   By: Lavonia Dana M.D.   On: 07/29/2020 19:57    EKG: Personally reviewed.  Rhythm is normal sinus rhythm with heart rate of 84 bpm.  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  No dynamic ST segment changes appreciated.  Assessment/Plan Principal Problem:   Syncope   Patient presenting with a single episode of syncope with associated chest discomfort.  Timing of patient's episode of syncope is suggestive of orthostatic syncope particularly with patient's reports of lightheadedness upon arising resting position for the past several  weeks.  That being said, patient has had a associated rather vague episode of chest discomfort in the setting of a longstanding history of hypertension, obesity, hyperlipidemia and diabetes   Monitoring patient overnight on telemetry  Cycling cardiac enzymes  Reviewing home medication regimen  Obtaining orthostatic vital signs  Obtaining echocardiogram  Active Problems:   Chest pain   Extremely atypical episode of chest discomfort that proceeded episode of syncope yesterday  Patient cannot recall when his last noninvasive ischemic work-up was.  Patient denies any recent history of exertional chest pain however  That being said, patient does have history of hypertension obesity hyperlipidemia and diabetes placing patient at a higher risk of coronary disease  We will continue to cycle cardiac enzymes  CT angiogram of the chest is already been performed in the emergency department and is negative for pulmonary embolism  Monitoring patient on telemetry  Echocardiogram in the morning  N.p.o. after midnight with cardiology consultation in the morning to evaluate whether a noninvasive ischemic assessment during this hospitalization is warranted.  Bilateral infiltrates on chest imaging   Scattered patchy airspace infiltrates in both lungs noted on CT imaging of the chest  Possible pulmonary edema, infectious process or interstitial lung disease  Patient denies shortness of breath at this time, lung exam simply reveal scattered rhonchi bilaterally  Obtaining BNP, procalcitonin, CRP to better evaluate etiology of mild infiltrates  Additionally obtaining echocardiogram  COVID-19 PCR negative  If initial work-up suggests infection, will treat with antibiotics as appropriate.  If BNP and echocardiogram suggest congestive heart failure we will try a trial of diuretics.  Acute cystitis without hematuria   Patient complaining of a several day history of lower abdominal pain  and foul-smelling urine  Urinalysis is nitrite and leukocyte esterase positive with a moderate amount of white blood cells per high-powered field  Considering patient's symptoms, will go ahead and treat patient with a course of antibiotics at this time.  Starting with ceftriaxone which can be quickly de-escalated based on urine culture results.     OSA treated with BiPAP   BiPAP nightly per home regimen    Type 2 diabetes mellitus without complication, without long-term current use of insulin ([place])  . Patient been placed on Accu-Cheks before every meal and nightly with sliding scale insulin . Holding home regimen of hypoglycemics . Hemoglobin A1C ordered . Diabetic Diet    Mixed diabetic hyperlipidemia associated with type 2 diabetes mellitus ([place])  . Continuing home regimen of lipid lowering therapy.  Mild intermittent asthma without complication   No clinical evidence of asthma exacerbation  As needed bronchodilator therapy for shortness of breath and wheezing    Essential hypertension   Continue home regimen of amlodipine    Acute lateral meniscus tear of right knee   Patient is status post right knee repair in late April  This prompted the emergency department provider to consider acute pulmonary embolism and therefore CT angiogram of the chest was performed and was negative for pulmonary embolism  As needed analgesics for associated pain  Weightbearing as tolerated    GERD without esophagitis  . Continuing home regimen of daily PPI therapy.  Cocaine and THC Use   Patient reports last use several weeks ago.  I am unsure as to how this would play into patient's presentation.  Will get tox screen  Will advise abstinence   Code Status:  Full code Family Communication: Deferred  Status is: Observation  The patient remains OBS appropriate and will d/c before 2 midnights.  Dispo: The patient is from: Home              Anticipated d/c is to:  Home              Patient currently is not medically stable to d/c.   Difficult to place patient No        Vernelle Emerald MD [facility] Pager [phone]  If 7PM-7AM, please contact night-coverage [email or web address] Use universal [place] password for that web site. If you do not have the password, please call the hospital operator.  07/29/2020, 11:15 PM

## 2020-07-29 NOTE — ED Provider Notes (Signed)
Emergency Medicine Provider Triage Evaluation Note  Kenneth [name] , a 60 y.o. male  was evaluated in triage.  Pt complains of syncope. Chest pain to central chest, passed out. Hit posterior head. Unsure last tetanus. No current CP, sob. No lower extremity swelling. No hx of syncope. No palpitations. No hx of PE, DVT. Did have right knee surgery 2 weeks ago. Has not noted extremity edema  Review of Systems  Positive: Syncope, CP Negative: Headache, weakness, edema  Physical Exam  There were no vitals taken for this visit. Gen:   Awake, no distress   Head: Small abrasion to posterior left scalp Resp:  Normal effort  MSK:   Moves extremities without difficulty  Other:    Medical Decision Making  Medically screening exam initiated at 11:44 AM.  Appropriate orders placed.  [name] was informed that the remainder of the evaluation will be completed by another provider, this initial triage assessment does not replace that evaluation, and the importance of remaining in the ED until their evaluation is complete.  syncope   [name] A, PA-C 07/29/20 1147    [name], MD 07/29/20 [phone]

## 2020-07-29 NOTE — Progress Notes (Signed)
[name], Kenneth [name] are scheduled for a virtual visit with your provider today.    Just as we do with appointments in the office, we must obtain your consent to participate.  Your consent will be active for this visit and any virtual visit you may have with one of our providers in the next 365 days.    If you have a MyChart account, I can also send a copy of this consent to you electronically.  All virtual visits are billed to your insurance company just like a traditional visit in the office.  As this is a virtual visit, video technology does not allow for your provider to perform a traditional examination.  This may limit your provider's ability to fully assess your condition.  If your provider identifies any concerns that need to be evaluated in person or the need to arrange testing such as labs, EKG, etc, we will make arrangements to do so.    Although advances in technology are sophisticated, we cannot ensure that it will always work on either your end or our end.  If the connection with a video visit is poor, we may have to switch to a telephone visit.  With either a video or telephone visit, we are not always able to ensure that we have a secure connection.   I need to obtain your verbal consent now.   Are you willing to proceed with your visit today?   Kenneth [name] declined to provide consent on 07/29/2020 for a virtual visit.  An in person visit will be arranged.    Kenneth Daring, PA-C 07/29/2020  10:46 AM    MyChart Video Visit    Virtual Visit via Video Note   This visit type was conducted due to national recommendations for restrictions regarding the COVID-19 Pandemic (e.g. social distancing) in an effort to limit this patient's exposure and mitigate transmission in our community. This patient is at least at moderate risk for complications without adequate follow up. This format is felt to be most appropriate for this patient at this time. Physical exam was limited by quality of the  video and audio technology used for the visit.   Video capability failed, but audio was intact so visit was continued as "audio-only".  Patient location: Home with niece Provider location: home office in [place]  I discussed the limitations of evaluation and management by telemedicine and the availability of in person appointments. The patient expressed understanding and agreed to proceed.  Patient: Kenneth [name]   DOB: 1960/07/31   60 y.o. Male  MRN: [id number] Visit Date: 07/29/2020  Today's healthcare provider: Mar Daring, PA-C   No chief complaint on file.  Subjective    HPI  Kenneth [name] is a 60 yr old male that presents today via [place] for syncopal episode with head trauma. He reports that yesterday he felt a tightness in his chest and then fell backwards losing consciousness for an undetermined amount of time. His niece reports he does have a large knot on his head. He has had something similar in the past, around 2016, without work up.  He recently, on 07/14/20 underwent a partial lateral meniscectomy on the right. It is possible he may not have been taking his prescribed medications.  Patient Active Problem List   Diagnosis Date Noted  . Acute lateral meniscus tear of right knee 07/07/2020  . DM type 2 (diabetes mellitus, type 2) ([place]) 02/25/2019  . OSA treated with BiPAP   .  Syncope 05/13/2014  . Asthma   . Knee pain   . Chest pain 10/23/2011  . Dysphagia 10/23/2011  . Hyperlipidemia 10/23/2011  . Obesity 10/23/2011   Past Medical History:  Diagnosis Date  . Arthritis   . Asthma   . Chronic bronchitis ([place])   . Diabetes mellitus without complication ([place])    type II  . GERD (gastroesophageal reflux disease)   . Headache   . Hypercholesteremia   . Hypertension   . OSA treated with BiPAP    ahi-132, bipap 23/19      Medications: Outpatient Medications Prior to Visit  Medication Sig  . AIMOVIG 70 MG/ML SOAJ INJECT 70 MG INTO THE SKIN EVERY 30  DAYS.  Marland Kitchen albuterol (VENTOLIN HFA) 108 (90 Base) MCG/ACT inhaler INHALE 2 PUFFS INTO THE LUNGS EVERY 4 HOURS AS NEEDED FOR SHORTNESS OF BREATH (Patient taking differently: Inhale 2 puffs into the lungs every 4 (four) hours as needed for wheezing or shortness of breath.)  . amLODipine (NORVASC) 10 MG tablet TAKE 1 TABLET BY MOUTH DAILY. (Patient taking differently: Take 10 mg by mouth in the morning.)  . aspirin 81 MG chewable tablet Chew 81 mg by mouth in the morning.  Marland Kitchen atorvastatin (LIPITOR) 10 MG tablet TAKE 1 TABLET BY MOUTH DAILY. (Patient taking differently: Take 10 mg by mouth in the morning.)  . Blood Glucose Monitoring Suppl (ACCU-CHEK AVIVA PLUS) w/Device KIT Use as directed to monitor FSBS 2x daily. Dx: E11.9  . Glycerin-Hypromellose-PEG 400 (VISINE DRY EYE) 0.2-0.2-1 % SOLN Place 1-2 drops into both eyes 3 (three) times daily as needed (dry/irritated eyes.).  Marland Kitchen HYDROcodone-acetaminophen (NORCO) 5-325 MG tablet Take 1-2 tablets by mouth 2 (two) times daily as needed.  . metFORMIN (GLUCOPHAGE) 1000 MG tablet TAKE 1 TABLET BY MOUTH 2 TIMES DAILY WITH A MEAL.  . mupirocin ointment (BACTROBAN) 2 % Apply 1 application topically 2 (two) times daily. (Patient taking differently: Apply 1 application topically 2 (two) times daily as needed (skin irritation).)  . neomycin-polymyxin b-dexamethasone (MAXITROL) 3.5-10000-0.1 SUSP Place 1 drop into the left eye in the morning, at noon, in the evening, and at bedtime.  . nitroGLYCERIN (NITROSTAT) 0.4 MG SL tablet Place 1 tablet (0.4 mg total) under the tongue every 5 (five) minutes as needed. For chest pain (Patient taking differently: Place 0.4 mg under the tongue every 5 (five) minutes x 3 doses as needed for chest pain.)  . omeprazole (PRILOSEC) 40 MG capsule TAKE 1 CAPSULE BY MOUTH DAILY. (Patient taking differently: Take 40 mg by mouth in the morning.)  . ondansetron (ZOFRAN) 4 MG tablet Take 1-2 tablets (4-8 mg total) by mouth every 8 (eight) hours as  needed for nausea or vomiting.  . senna-docusate (SENOKOT-S) 8.6-50 MG tablet Take 1 tablet by mouth daily as needed (constipation).  . topiramate (TOPAMAX) 25 MG tablet Take 2 tablets (50 mg total) by mouth 2 (two) times daily.   Facility-Administered Medications Prior to Visit  Medication Dose Route Frequency Provider  . lactated ringers infusion    Continuous PRN Chyrel Masson, CRNA    Review of Systems  Last CBC Lab Results  Component Value Date   WBC 9.3 07/14/2020   HGB 16.3 07/14/2020   HCT 48.0 07/14/2020   MCV 83.7 07/14/2020   MCH 26.8 07/14/2020   RDW 17.8 (H) 07/14/2020   PLT 349 66/08/3014   Last metabolic panel Lab Results  Component Value Date   GLUCOSE 121 (H) 07/14/2020   NA 140 07/14/2020  K 4.0 07/14/2020   CL 104 07/14/2020   CO2 20 (L) 02/17/2020   BUN 16 07/14/2020   CREATININE 0.70 07/14/2020   GFRNONAA >60 02/17/2020   GFRAA 110 12/18/2019   CALCIUM 9.6 02/17/2020   PROT 8.0 02/17/2020   ALBUMIN 3.8 02/17/2020   BILITOT 0.2 (L) 02/17/2020   ALKPHOS 65 02/17/2020   AST 19 02/17/2020   ALT 22 02/17/2020   ANIONGAP 13 02/17/2020      Objective    There were no vitals taken for this visit. BP Readings from Last 3 Encounters:  07/14/20 131/76  05/11/20 138/70  05/08/20 (!) 145/76   Wt Readings from Last 3 Encounters:  07/14/20 280 lb (127 kg)  05/11/20 283 lb (128.4 kg)  05/08/20 279 lb (126.6 kg)      Physical Exam     Assessment & Plan     1. Syncope, unspecified syncope type - New syncopal episode yesterday preceded by chest tightness/squeezing and recent h/o knee surgery - Advised patient and niece to proceed to ER for further evaluation and testing - They voice understanding and agree to proceed to ER  2. Injury of head, initial encounter - Video not available for visit so unable to visualize head wound - Niece reports large "knot" - Advised ER as noted above   No follow-ups on file.     I discussed the  assessment and treatment plan with the patient. The patient was provided an opportunity to ask questions and all were answered. The patient agreed with the plan and demonstrated an understanding of the instructions.   The patient was advised to call back or seek an in-person evaluation if the symptoms worsen or if the condition fails to improve as anticipated.  I provided 10 minutes of face-to-face time during this encounter via MyChart Video enabled encounter.   [place] [phone] (phone) [phone] (fax)  [place]

## 2020-07-29 NOTE — ED Triage Notes (Signed)
Pt reports yesterday had a sharp stabbing chest pain and then passed out, falling and hitting the back of his head on the concrete. Denies chest pain at present, endorses headache.

## 2020-07-29 NOTE — ED Notes (Signed)
Patient is resting comfortably. 

## 2020-07-30 ENCOUNTER — Other Ambulatory Visit (HOSPITAL_COMMUNITY): Payer: Self-pay

## 2020-07-30 ENCOUNTER — Encounter (HOSPITAL_COMMUNITY): Payer: Self-pay | Admitting: Internal Medicine

## 2020-07-30 ENCOUNTER — Observation Stay (HOSPITAL_BASED_OUTPATIENT_CLINIC_OR_DEPARTMENT_OTHER): Payer: Medicare Other

## 2020-07-30 DIAGNOSIS — N3000 Acute cystitis without hematuria: Secondary | ICD-10-CM | POA: Diagnosis not present

## 2020-07-30 DIAGNOSIS — R55 Syncope and collapse: Secondary | ICD-10-CM

## 2020-07-30 DIAGNOSIS — I10 Essential (primary) hypertension: Secondary | ICD-10-CM | POA: Diagnosis not present

## 2020-07-30 LAB — LIPID PANEL
Cholesterol: 200 mg/dL (ref 0–200)
HDL: 48 mg/dL (ref >40)
LDL Cholesterol: 134 mg/dL — ABNORMAL HIGH (ref 0–99)
Total CHOL/HDL Ratio: 4.2 RATIO
Triglycerides: 92 mg/dL (ref <150)
VLDL: 18 mg/dL (ref 0–40)

## 2020-07-30 LAB — RAPID URINE DRUG SCREEN, HOSP PERFORMED
Amphetamines: NOT DETECTED
Barbiturates: NOT DETECTED
Benzodiazepines: NOT DETECTED
Cocaine: POSITIVE — AB
Opiates: NOT DETECTED
Tetrahydrocannabinol: NOT DETECTED

## 2020-07-30 LAB — C-REACTIVE PROTEIN: CRP: 0.9 mg/dL (ref <1.0)

## 2020-07-30 LAB — GLUCOSE, CAPILLARY
Glucose-Capillary: 152 mg/dL — ABNORMAL HIGH (ref 70–99)
Glucose-Capillary: 110 mg/dL — ABNORMAL HIGH (ref 70–99)

## 2020-07-30 LAB — HIV ANTIBODY (ROUTINE TESTING W REFLEX): HIV Screen 4th Generation wRfx: NONREACTIVE

## 2020-07-30 LAB — ECHOCARDIOGRAM COMPLETE
Area-P 1/2: 2.26 cm2
Height: 66 in
S' Lateral: 2.7 cm
Weight: 4479.75 oz

## 2020-07-30 LAB — PROCALCITONIN: Procalcitonin: <0.1 ng/mL

## 2020-07-30 LAB — HEMOGLOBIN A1C
Hgb A1c MFr Bld: 7.6 % — ABNORMAL HIGH (ref 4.8–5.6)
Mean Plasma Glucose: 171.42 mg/dL

## 2020-07-30 LAB — TROPONIN I (HIGH SENSITIVITY): Troponin I (High Sensitivity): 5 ng/L (ref <18)

## 2020-07-30 MED ORDER — CEPHALEXIN 500 MG PO CAPS
500.0000 mg | ORAL_CAPSULE | Freq: Two times a day (BID) | ORAL | 0 refills | Status: DC
Start: 2020-07-30 — End: 2020-12-21
  Filled 2020-07-30: qty 8, 4d supply, fill #0

## 2020-07-30 MED ORDER — CEPHALEXIN 500 MG PO CAPS
500.0000 mg | ORAL_CAPSULE | Freq: Two times a day (BID) | ORAL | Status: DC
  Administered 2020-07-30: 500 mg via ORAL
  Filled 2020-07-30: qty 1

## 2020-07-30 NOTE — Discharge Summary (Signed)
Physician Discharge Summary  Kenneth [name] YDX:[id number] DOB: Aug 19, 1960 DOA: 07/29/2020  PCP: [name], MD  Admit date: 07/29/2020 Discharge date: 07/30/2020  Admitted From: home Discharge disposition: home   Recommendations for Outpatient Follow-Up:   Cocaine cessation Urine culture pending  Discharge Diagnosis:   Principal Problem:   Syncope Active Problems:   Chest pain   OSA treated with BiPAP   Type 2 diabetes mellitus without complication, without long-term current use of insulin (HCC)   Acute lateral meniscus tear of right knee   Mixed diabetic hyperlipidemia associated with type 2 diabetes mellitus (HCC)   Mild intermittent asthma without complication   Essential hypertension   GERD without esophagitis   Acute cystitis without hematuria   Bilateral pulmonary infiltrates on CXR    Discharge Condition: Improved.  Diet recommendation: Low sodium, heart healthy.  Carbohydrate-modified  Wound care: None.  Code status: Full.   History of Present Illness:   60 year old male with past medical history of mild intermittent asthma, diabetes mellitus type 2, gastroesophageal reflux disease, obstructive sleep apnea on BiPAP, hyperlipidemia, hypertension and recent right meniscal repair in late April who presents to [facility] emergency department after experiencing an episode of syncope and chest discomfort.  Patient explains that yesterday afternoon.  Patient had proceeded to get up out of his car shortly after he rose from a seated position he suddenly began to experience chest discomfort.  The chest discomfort was moderate to severe in intensity, midsternal nonradiating and tight in quality.  Shortly after the onset of this chest discomfort the patient lost consciousness.  At the time the patient was with his brother who witnessed the event.  Patient states that his brother denied any associated seizure-like activity.  Patient reports that he  immediately woke up after several seconds and denies any tongue biting self urination or self defecation.  Patient reports that with after he woke up he no longer felt any chest discomfort.  Hospital Course by Problem:   Syncope  Timing of patient's episode of syncope is suggestive of orthostatic syncope particularly with patient's reports of lightheadedness upon arising resting position for the past several weeks.  Echo unrevealing  CE negative  Improved after IVF     Chest pain  Outpatient cardiology follow up  Cocaine cessation   Acute cystitis without hematuria  Patient complaining of a several day history of lower abdominal pain and foul-smelling urine  Urinalysis is nitrite and leukocyte esterase positive with a moderate amount of white blood cells per high-powered field  Treat with PO abx  Follow urine culture     OSA treated with BiPAP  BiPAP nightly per home regimen    Type 2 diabetes mellitus without complication, without long-term current use of insulin ([place]) -resume home meds    Mixed diabetic hyperlipidemia associated with type 2 diabetes mellitus ([place])  Continuing home regimen of lipid lowering therapy.    Mild intermittent asthma without complication  No clinical evidence of asthma exacerbation  As needed bronchodilator therapy for shortness of breath and wheezing    Essential hypertension  Continue home regimen of amlodipine    Acute lateral meniscus tear of right knee  Patient is status post right knee repair in late April  This prompted the emergency department provider to consider acute pulmonary embolism and therefore CT angiogram of the chest was performed and was negative for pulmonary embolism     GERD without esophagitis  Continuing home regimen of daily  PPI therapy.  Cocaine and THC Use  Encourage cessation     Medical Consultants:      Discharge Exam:   Vitals:   07/30/20 0754 07/30/20 1133  BP:  122/71 130/75  Pulse: 70 76  Resp: 16 16  Temp: 98.3 F (36.8 C) 98 F (36.7 C)  SpO2: 97% 99%   Vitals:   07/30/20 0053 07/30/20 0507 07/30/20 0754 07/30/20 1133  BP: 129/67 111/69 122/71 130/75  Pulse: 67 69 70 76  Resp: _0 Temp: 98.3 F (36.8 C) 98.3 F (36.8 C) 98.3 F (36.8 C) 98 F (36.7 C)  TempSrc: Oral Oral Oral Oral  SpO2: 97% 97% 97% 99%  Weight:      Height:        General exam: Appears calm and comfortable.    The results of significant diagnostics from this hospitalization (including imaging, microbiology, ancillary and laboratory) are listed below for reference.     Procedures and Diagnostic Studies:   DG Chest 2 View  Result Date: 07/29/2020 CLINICAL DATA:  Syncope.  Chest pain. EXAM: CHEST - 2 VIEW COMPARISON:  07/20/2014. FINDINGS: Heart size normal. Diffuse bilateral interstitial prominence. Pneumonitis cannot be excluded. No pleural effusion or pneumothorax. IMPRESSION: Diffuse bilateral interstitial prominence. Pneumonitis cannot be excluded. Electronically Signed   By: Marcello Moores  Register   On: 07/29/2020 12:20   DG Elbow Complete Left  Result Date: 07/29/2020 CLINICAL DATA:  Stall. Left elbow injury and pain. Initial encounter. EXAM: LEFT ELBOW - COMPLETE 3+ VIEW COMPARISON:  None. FINDINGS: There is no evidence of fracture, dislocation, or joint effusion. There is no evidence of arthropathy. A small bone spur is seen arising from the olecranon process. No other osseous abnormality identified. Soft tissues are unremarkable. IMPRESSION: No acute findings. Electronically Signed   By: Marlaine Hind M.D.   On: 07/29/2020 19:18   CT Head Wo Contrast  Result Date: 07/29/2020 CLINICAL DATA:  Headache, new or worsening, posttraumatic. Additional history provided: Syncope with fall this morning. EXAM: CT HEAD WITHOUT CONTRAST TECHNIQUE: Contiguous axial images were obtained from the base of the skull through the vertex without intravenous contrast.  COMPARISON:  Prior head CT examinations 02/17/2020 and earlier. FINDINGS: Brain: Cerebral volume is normal for age. There is no acute intracranial hemorrhage. No demarcated cortical infarct. No extra-axial fluid collection. No evidence of intracranial mass. No midline shift. Vascular: No hyperdense vessel.  Atherosclerotic calcifications. Skull: Normal. Negative for fracture or focal lesion. Sinuses/Orbits: Visualized orbits show no acute finding. Moderate mucosal thickening and large fluid level within the right maxillary sinus. Other: Parietal scalp hematoma eccentric to the left. IMPRESSION: No evidence of acute intracranial abnormality. Parietal scalp hematoma eccentric to the left. Right maxillary sinusitis. Electronically Signed   By: Kellie Simmering DO   On: 07/29/2020 13:00   CT Angio Chest PE W/Cm &/Or Wo Cm  Result Date: 07/29/2020 CLINICAL DATA:  Suspected pulmonary embolism, sharp stabbing chest pain followed by syncopal episode, fell and struck back of head on concrete, history type II diabetes mellitus, hypertension, asthma EXAM: CT ANGIOGRAPHY CHEST WITH CONTRAST TECHNIQUE: Multidetector CT imaging of the chest was performed using the standard protocol during bolus administration of intravenous contrast. Multiplanar CT image reconstructions and MIPs were obtained to evaluate the vascular anatomy. CONTRAST:  22m OMNIPAQUE IOHEXOL 350 MG/ML SOLN IV COMPARISON:  04/27/2014 FINDINGS: Cardiovascular: Atherosclerotic calcifications aorta and coronary arteries. Aorta normal caliber without aneurysm or dissection. Heart unremarkable. No pericardial effusion. Small foci of air  within the main pulmonary artery likely related to IV access. Pulmonary arteries well opacified and patent. No evidence of pulmonary embolism. Mediastinum/Nodes: Question minimal wall thickening of the distal esophagus, unchanged. Remainder of esophagus unremarkable. Base of cervical region normal appearance. No thoracic adenopathy.  Asymmetric gynecomastia greater on LEFT Lungs/Pleura: Scattered patchy airspace infiltrates in both lungs,, greater peripherally, portions of which are present on the previous exam. This could represent recurrent infiltrate or edema, or progression of peripheral interstitial lung disease. No pleural effusion or pneumothorax. Upper Abdomen: Unremarkable Musculoskeletal: No acute osseous findings. Review of the MIP images confirms the above findings. IMPRESSION: No evidence of pulmonary embolism. Scattered patchy airspace infiltrates in both lungs question recurrent infiltrate or edema versus progression of predominately peripheral interstitial lung disease. Aortic Atherosclerosis (ICD10-I70.0). Electronically Signed   By: Lavonia Dana M.D.   On: 07/29/2020 19:57   ECHOCARDIOGRAM COMPLETE  Result Date: 07/30/2020    ECHOCARDIOGRAM REPORT   Patient Name:   DONNEY CARAVEO Date of Exam: 07/30/2020 Medical Rec #:  [id number]     Height:       66.0 in Accession #:    [id number]    Weight:       280.0 lb Date of Birth:  1960/05/14      BSA:          2.307 m Patient Age:    61 years      BP:           111/69 mmHg Patient Gender: M             HR:           72 bpm. Exam Location:  Inpatient Procedure: 2D Echo, Cardiac Doppler and Color Doppler Indications:    R55 Syncope  History:        Patient has prior history of Echocardiogram examinations, most                 recent 10/23/2011. Risk Factors:Hypertension and Diabetes.  Sonographer:    Bernadene Person RDCS Referring Phys: [id number] [place]  1. Left ventricular ejection fraction, by estimation, is 55 to 60%. The left ventricle has normal function. The left ventricle has no regional wall motion abnormalities. Left ventricular diastolic parameters were normal.  2. Right ventricular systolic function is normal. The right ventricular size is normal. Tricuspid regurgitation signal is inadequate for assessing PA pressure.  3. The mitral valve is grossly normal.  Trivial mitral valve regurgitation. No evidence of mitral stenosis.  4. The aortic valve is tricuspid. There is mild calcification of the aortic valve. Aortic valve regurgitation is not visualized. No aortic stenosis is present.  5. The inferior vena cava is normal in size with greater than 50% respiratory variability, suggesting right atrial pressure of 3 mmHg. FINDINGS  Left Ventricle: Left ventricular ejection fraction, by estimation, is 55 to 60%. The left ventricle has normal function. The left ventricle has no regional wall motion abnormalities. The left ventricular internal cavity size was normal in size. There is  no left ventricular hypertrophy. Left ventricular diastolic parameters were normal. Right Ventricle: The right ventricular size is normal. No increase in right ventricular wall thickness. Right ventricular systolic function is normal. Tricuspid regurgitation signal is inadequate for assessing PA pressure. Left Atrium: Left atrial size was normal in size. Right Atrium: Right atrial size was normal in size. Pericardium: Trivial pericardial effusion is present. Presence of pericardial fat pad. Mitral Valve: The mitral valve is grossly normal. Trivial  mitral valve regurgitation. No evidence of mitral valve stenosis. Tricuspid Valve: The tricuspid valve is grossly normal. Tricuspid valve regurgitation is not demonstrated. No evidence of tricuspid stenosis. Aortic Valve: The aortic valve is tricuspid. There is mild calcification of the aortic valve. Aortic valve regurgitation is not visualized. No aortic stenosis is present. Pulmonic Valve: The pulmonic valve was grossly normal. Pulmonic valve regurgitation is not visualized. No evidence of pulmonic stenosis. Aorta: The aortic root and ascending aorta are structurally normal, with no evidence of dilitation. Venous: The inferior vena cava is normal in size with greater than 50% respiratory variability, suggesting right atrial pressure of 3 mmHg.  IAS/Shunts: The atrial septum is grossly normal.  LEFT VENTRICLE PLAX 2D LVIDd:         4.10 cm  Diastology LVIDs:         2.70 cm  LV e' medial:    8.35 cm/s LV PW:         2.20 cm  LV E/e' medial:  6.4 LV IVS:        1.20 cm  LV e' lateral:   11.20 cm/s LVOT diam:     2.20 cm  LV E/e' lateral: 4.8 LV SV:         84 LV SV Index:   37 LVOT Area:     3.80 cm  RIGHT VENTRICLE RV S prime:     11.80 cm/s TAPSE (M-mode): 2.0 cm LEFT ATRIUM             Index       RIGHT ATRIUM          Index LA diam:        3.40 cm 1.47 cm/m  RA Area:     8.74 cm LA Vol (A2C):   36.8 ml 15.95 ml/m RA Volume:   17.20 ml 7.45 ml/m LA Vol (A4C):   45.5 ml 19.72 ml/m LA Biplane Vol: 41.2 ml 17.86 ml/m  AORTIC VALVE LVOT Vmax:   114.00 cm/s LVOT Vmean:  74.600 cm/s LVOT VTI:    0.222 m  AORTA Ao Root diam: 3.30 cm MITRAL VALVE MV Area (PHT): 2.26 cm    SHUNTS MV Decel Time: 336 msec    Systemic VTI:  0.22 m MV E velocity: 53.60 cm/s  Systemic Diam: 2.20 cm MV A velocity: 48.10 cm/s MV E/A ratio:  1.11 Eleonore Chiquito MD Electronically signed by Eleonore Chiquito MD Signature Date/Time: 07/30/2020/3:02:27 PM    Final      Labs:   Basic Metabolic Panel: Recent Labs  Lab 07/29/20 1156  NA 136  K 3.9  CL 106  CO2 23  GLUCOSE 122*  BUN 17  CREATININE 0.83  CALCIUM 9.4   GFR Estimated Creatinine Clearance: 119.3 mL/min (by C-G formula based on SCr of 0.83 mg/dL). Liver Function Tests: No results for input(s): AST, ALT, ALKPHOS, BILITOT, PROT, ALBUMIN in the last 168 hours. No results for input(s): LIPASE, AMYLASE in the last 168 hours. No results for input(s): AMMONIA in the last 168 hours. Coagulation profile No results for input(s): INR, PROTIME in the last 168 hours.  CBC: Recent Labs  Lab 07/29/20 1156  WBC 8.6  NEUTROABS 4.4  HGB 14.2  HCT 45.0  MCV 84.7  PLT 311   Cardiac Enzymes: No results for input(s): CKTOTAL, CKMB, CKMBINDEX, TROPONINI in the last 168 hours. BNP: Invalid input(s):  POCBNP CBG: Recent Labs  Lab 07/30/20 0609 07/30/20 1144  GLUCAP 152* 110*   D-Dimer No results  for input(s): DDIMER in the last 72 hours. Hgb A1c Recent Labs    07/30/20 0235  HGBA1C 7.6*   Lipid Profile Recent Labs    07/30/20 0235  CHOL 200  HDL 48  LDLCALC 134*  TRIG 92  CHOLHDL 4.2   Thyroid function studies No results for input(s): TSH, T4TOTAL, T3FREE, THYROIDAB in the last 72 hours.  Invalid input(s): FREET3 Anemia work up No results for input(s): VITAMINB12, FOLATE, FERRITIN, TIBC, IRON, RETICCTPCT in the last 72 hours. Microbiology Recent Results (from the past 240 hour(s))  Resp Panel by RT-PCR (Flu A&B, Covid) Nasopharyngeal Swab     Status: None   Collection Time: 07/29/20  9:06 PM   Specimen: Nasopharyngeal Swab; Nasopharyngeal(NP) swabs in vial transport medium  Result Value Ref Range Status   SARS Coronavirus 2 by RT PCR NEGATIVE NEGATIVE Final    Comment: (NOTE) SARS-CoV-2 target nucleic acids are NOT DETECTED.  The SARS-CoV-2 RNA is generally detectable in upper respiratory specimens during the acute phase of infection. The lowest concentration of SARS-CoV-2 viral copies this assay can detect is 138 copies/mL. A negative result does not preclude SARS-Cov-2 infection and should not be used as the sole basis for treatment or other patient management decisions. A negative result may occur with  improper specimen collection/handling, submission of specimen other than nasopharyngeal swab, presence of viral mutation(s) within the areas targeted by this assay, and inadequate number of viral copies(<138 copies/mL). A negative result must be combined with clinical observations, patient history, and epidemiological information. The expected result is Negative.  Fact Sheet for Patients:  EntrepreneurPulse.com.au  Fact Sheet for Healthcare Providers:  IncredibleEmployment.be  This test is no t yet approved or  cleared by the [place] FDA and  has been authorized for detection and/or diagnosis of SARS-CoV-2 by FDA under an Emergency Use Authorization (EUA). This EUA will remain  in effect (meaning this test can be used) for the duration of the COVID-19 declaration under Section 564(b)(1) of the Act, 21 U.S.C.section 360bbb-3(b)(1), unless the authorization is terminated  or revoked sooner.       Influenza A by PCR NEGATIVE NEGATIVE Final   Influenza B by PCR NEGATIVE NEGATIVE Final    Comment: (NOTE) The Xpert Xpress SARS-CoV-2/FLU/RSV plus assay is intended as an aid in the diagnosis of influenza from Nasopharyngeal swab specimens and should not be used as a sole basis for treatment. Nasal washings and aspirates are unacceptable for Xpert Xpress SARS-CoV-2/FLU/RSV testing.  Fact Sheet for Patients: EntrepreneurPulse.com.au  Fact Sheet for Healthcare Providers: IncredibleEmployment.be  This test is not yet approved or cleared by the [place] FDA and has been authorized for detection and/or diagnosis of SARS-CoV-2 by FDA under an Emergency Use Authorization (EUA). This EUA will remain in effect (meaning this test can be used) for the duration of the COVID-19 declaration under Section 564(b)(1) of the Act, 21 U.S.C. section 360bbb-3(b)(1), unless the authorization is terminated or revoked.  Performed at [facility], [place] [address], [place], [place] [address]      Discharge Instructions:   Discharge Instructions    Diet - low sodium heart healthy   Complete by: As directed    Diet Carb Modified   Complete by: As directed    Discharge instructions   Complete by: As directed    Cocaine cessation!   Increase activity slowly   Complete by: As directed      Allergies as of 07/30/2020   No Known Allergies  Medication List    TAKE these medications   Accu-Chek Aviva Plus w/Device Kit Use as directed to monitor FSBS 2x  daily. Dx: E11.9   Aimovig 70 MG/ML Soaj Generic drug: Erenumab-aooe INJECT 70 MG INTO THE SKIN EVERY 30 DAYS.   albuterol 108 (90 Base) MCG/ACT inhaler Commonly known as: VENTOLIN HFA INHALE 2 PUFFS INTO THE LUNGS EVERY 4 HOURS AS NEEDED FOR SHORTNESS OF BREATH What changed: See the new instructions.   amLODipine 10 MG tablet Commonly known as: NORVASC TAKE 1 TABLET BY MOUTH DAILY. What changed: when to take this   aspirin 81 MG chewable tablet Chew 81 mg by mouth in the morning.   atorvastatin 10 MG tablet Commonly known as: LIPITOR TAKE 1 TABLET BY MOUTH DAILY. What changed: when to take this   cephALEXin 500 MG capsule Commonly known as: KEFLEX Take 1 capsule (500 mg total) by mouth every 12 (twelve) hours.   HYDROcodone-acetaminophen 5-325 MG tablet Commonly known as: Norco Take 1-2 tablets by mouth 2 (two) times daily as needed. What changed: reasons to take this   metFORMIN 1000 MG tablet Commonly known as: GLUCOPHAGE TAKE 1 TABLET BY MOUTH 2 TIMES DAILY WITH A MEAL.   mupirocin ointment 2 % Commonly known as: BACTROBAN Apply 1 application topically 2 (two) times daily. What changed:   when to take this  reasons to take this   neomycin-polymyxin b-dexamethasone 3.5-10000-0.1 Susp Commonly known as: MAXITROL Place 1 drop into the left eye in the morning, at noon, in the evening, and at bedtime.   nitroGLYCERIN 0.4 MG SL tablet Commonly known as: NITROSTAT Place 1 tablet (0.4 mg total) under the tongue every 5 (five) minutes as needed. For chest pain What changed:   when to take this  reasons to take this  additional instructions   omeprazole 40 MG capsule Commonly known as: PRILOSEC TAKE 1 CAPSULE BY MOUTH DAILY. What changed: when to take this   ondansetron 4 MG tablet Commonly known as: ZOFRAN Take 1-2 tablets (4-8 mg total) by mouth every 8 (eight) hours as needed for nausea or vomiting.   senna-docusate 8.6-50 MG tablet Commonly known  as: Senokot-S Take 1 tablet by mouth daily as needed (constipation).   topiramate 25 MG tablet Commonly known as: TOPAMAX Take 2 tablets (50 mg total) by mouth 2 (two) times daily.   Visine Dry Eye 0.2-0.2-1 % Soln Generic drug: Glycerin-Hypromellose-PEG 400 Place 1-2 drops into both eyes 3 (three) times daily as needed (dry/irritated eyes.).         Time coordinating discharge: 25 min  Signed:  [facility] 07/30/2020, 3:18 PM

## 2020-07-30 NOTE — Progress Notes (Signed)
  Echocardiogram 2D Echocardiogram has been performed.  Kenneth [name] 07/30/2020, 11:40 AM

## 2020-07-30 NOTE — Progress Notes (Addendum)
Heart Failure Nurse Navigator Progress Note  ECHO results WNL; Navigation team signing off.   Pricilla Holm, RN, BSN Heart Failure Nurse Navigator [phone]

## 2020-07-30 NOTE — Plan of Care (Signed)
  Problem: Education: Goal: Knowledge of General Education information will improve Description: Including pain rating scale, medication(s)/side effects and non-pharmacologic comfort measures Outcome: Completed/Met   Problem: Health Behavior/Discharge Planning: Goal: Ability to manage health-related needs will improve Outcome: Completed/Met

## 2020-07-30 NOTE — Progress Notes (Addendum)
AVS reviewed with patient. Received medication. Transport home by sister.

## 2020-07-30 NOTE — Progress Notes (Signed)
Heart Failure Nurse Navigator Progress Note  Navigation team following this hospitalization. Screening pending further testing/cardiac workup.   Pricilla Holm, RN, BSN Heart Failure Nurse Navigator [phone]

## 2020-07-30 NOTE — Progress Notes (Addendum)
Urine sampled collected and sent. Urine clear, yellow, no blood noted.

## 2020-07-30 NOTE — Progress Notes (Signed)
   07/30/20 1133  Vitals  Temp 98 F (36.7 C)  Temp Source Oral  BP 130/75  MAP (mmHg) 90  BP Location Left Arm  BP Method Automatic  Patient Position (if appropriate) Lying  Pulse Rate 76  Pulse Rate Source Dinamap  Resp 16  Level of Consciousness  Level of Consciousness Alert  MEWS COLOR  MEWS Score Color Green  Orthostatic Lying   BP- Lying 130/75  Pulse- Lying 76  Orthostatic Sitting  BP- Sitting 125/88  Pulse- Sitting 83  Orthostatic Standing at 0 minutes  BP- Standing at 0 minutes 134/76  Pulse- Standing at 0 minutes 85  Orthostatic Standing at 3 minutes  BP- Standing at 3 minutes (!) 115/103  Pulse- Standing at 3 minutes 88  Oxygen Therapy  SpO2 99 %  O2 Device Room Air  Pain Assessment  Pain Scale 0-10  Pain Score 0    Patient ambulated in hallway independently. Denied any HA, pain, dz.nausea. Please see orthostactic VS as above. MD notified of data.

## 2020-07-30 NOTE — Progress Notes (Signed)
Small blood tinged urine was observed in patient's urine. Patient also complained of pain during urination. Reassured him and letting the doctor know and also the antibiotic given him should take care of it.

## 2020-07-31 LAB — URINE CULTURE: Culture: NO GROWTH

## 2020-08-02 ENCOUNTER — Encounter: Payer: Self-pay | Admitting: Family Medicine

## 2020-08-02 ENCOUNTER — Other Ambulatory Visit: Payer: Self-pay

## 2020-08-02 ENCOUNTER — Ambulatory Visit (INDEPENDENT_AMBULATORY_CARE_PROVIDER_SITE_OTHER): Payer: Medicare Other | Admitting: Family Medicine

## 2020-08-02 VITALS — BP 114/72 | HR 98 | Temp 97.8°F | Resp 18 | Ht 66.0 in | Wt 280.0 lb

## 2020-08-02 DIAGNOSIS — R55 Syncope and collapse: Secondary | ICD-10-CM

## 2020-08-02 MED ORDER — BLOOD GLUCOSE MONITOR KIT: PACK | 11 refills | Status: AC

## 2020-08-02 NOTE — Progress Notes (Signed)
BP sitting is 114/72 HR 98  BP Standing is 120/80 HR 94  Patient reports he has been hydrating with water since the incident.

## 2020-08-02 NOTE — Progress Notes (Signed)
Subjective:    Patient ID: Kenneth [name], male    DOB: 05-20-1960, 60 y.o.   MRN: [id number]  HPI Patient was admitted to the hospital on May 5.  He recently had surgery on his knee.  On May 5, he was riding in a car with his brother.  He stood to get out of the car.  As he started to walk around the car, he felt a sudden intense tightening in the center of his chest.  It was located in the substernal area.  He felt lightheaded and passed out.  He hit the ground.  He regained consciousness after just a few seconds.  There was no seizure activity.  There is no bowel or bladder incontinence.  He did not bite his tongue.  He denied any symptoms of cardiac arrhythmia.  He did not feel any palpitations or racing or fluttering in his chest.  However the chest pain resolved after he awoke.  He went to the hospital.  They performed an echocardiogram.  Left ventricular ejection fraction was 55 to 60% and there were no wall motion abnormalities.  Cardiac enzymes were negative x3.  He had a CT angiogram to rule out PE given his recent knee surgery.  There was no pulmonary embolism although there was mild airspace disease consistent with early infiltrate.  Urinalysis did show possible urinary tract infection which she has been taking Keflex now for 3 days.  He was diagnosed with orthostatic syncope secondary to dehydration, urinary tract infection.  Of note he had also been drinking alcohol and used cocaine approximately 8 hours prior to the incident.  Is also possible that he had a coronary vasospasm secondary to the cocaine which may have induced a cardiac arrhythmia. Past Medical History:  Diagnosis Date  . Arthritis   . Asthma   . Chronic bronchitis ([place])   . Diabetes mellitus without complication ([place])    type II  . GERD (gastroesophageal reflux disease)   . Headache   . Hypercholesteremia   . Hypertension   . OSA treated with BiPAP    ahi-132, bipap 23/19   Past Surgical History:  Procedure  Laterality Date  . ESOPHAGOGASTRODUODENOSCOPY (EGD) WITH PROPOFOL N/A 12/11/2012   Procedure: ESOPHAGOGASTRODUODENOSCOPY (EGD) WITH PROPOFOL;  Surgeon: Arta Silence, MD;  Location: [facility];  Service: Endoscopy;  Laterality: N/A;  . FINGER AMPUTATION    . KNEE ARTHROSCOPY Left 11/15/2015   Procedure: ARTHROSCOPY KNEE AND MEDIAL MENISECTOMY;  Surgeon: Paralee Cancel, MD;  Location: [facility];  Service: Orthopedics;  Laterality: Left;  . KNEE ARTHROSCOPY WITH LATERAL MENISECTOMY Right 07/14/2020   Procedure: RIGHT KNEE PARTIAL LATERAL MENISCECTOMY;  Surgeon: Leandrew Koyanagi, MD;  Location: [place];  Service: Orthopedics;  Laterality: Right;   Current Outpatient Medications on File Prior to Visit  Medication Sig Dispense Refill  . AIMOVIG 70 MG/ML SOAJ INJECT 70 MG INTO THE SKIN EVERY 30 DAYS. 1 mL 3  . albuterol (VENTOLIN HFA) 108 (90 Base) MCG/ACT inhaler INHALE 2 PUFFS INTO THE LUNGS EVERY 4 HOURS AS NEEDED FOR SHORTNESS OF BREATH (Patient taking differently: Inhale 2 puffs into the lungs every 4 (four) hours as needed for wheezing or shortness of breath.) 8.5 g 2  . amLODipine (NORVASC) 10 MG tablet TAKE 1 TABLET BY MOUTH DAILY. (Patient taking differently: Take 10 mg by mouth in the morning.) 90 tablet 3  . aspirin 81 MG chewable tablet Chew 81 mg by mouth in the morning.    Marland Kitchen atorvastatin (  LIPITOR) 10 MG tablet TAKE 1 TABLET BY MOUTH DAILY. (Patient taking differently: Take 10 mg by mouth in the morning.) 90 tablet 1  . Blood Glucose Monitoring Suppl (ACCU-CHEK AVIVA PLUS) w/Device KIT Use as directed to monitor FSBS 2x daily. Dx: E11.9 1 kit 1  . cephALEXin (KEFLEX) 500 MG capsule Take 1 capsule (500 mg total) by mouth every 12 (twelve) hours. 8 capsule 0  . Glycerin-Hypromellose-PEG 400 (VISINE DRY EYE) 0.2-0.2-1 % SOLN Place 1-2 drops into both eyes 3 (three) times daily as needed (dry/irritated eyes.).    Marland Kitchen HYDROcodone-acetaminophen (NORCO) 5-325 MG tablet Take 1-2 tablets by mouth 2 (two) times  daily as needed. (Patient taking differently: Take 1-2 tablets by mouth 2 (two) times daily as needed for moderate pain.) 20 tablet 0  . metFORMIN (GLUCOPHAGE) 1000 MG tablet TAKE 1 TABLET BY MOUTH 2 TIMES DAILY WITH A MEAL. (Patient taking differently: Take 1,000 mg by mouth 2 (two) times daily with a meal.) 180 tablet 3  . mupirocin ointment (BACTROBAN) 2 % Apply 1 application topically 2 (two) times daily. (Patient taking differently: Apply 1 application topically 2 (two) times daily as needed (skin irritation).) 30 g 2  . neomycin-polymyxin b-dexamethasone (MAXITROL) 3.5-10000-0.1 SUSP Place 1 drop into the left eye in the morning, at noon, in the evening, and at bedtime.    . nitroGLYCERIN (NITROSTAT) 0.4 MG SL tablet Place 1 tablet (0.4 mg total) under the tongue every 5 (five) minutes as needed. For chest pain (Patient taking differently: Place 0.4 mg under the tongue every 5 (five) minutes x 3 doses as needed for chest pain.) 20 tablet 1  . omeprazole (PRILOSEC) 40 MG capsule TAKE 1 CAPSULE BY MOUTH DAILY. (Patient taking differently: Take 40 mg by mouth in the morning.) 90 capsule 3  . ondansetron (ZOFRAN) 4 MG tablet Take 1-2 tablets (4-8 mg total) by mouth every 8 (eight) hours as needed for nausea or vomiting. 20 tablet 0  . senna-docusate (SENOKOT-S) 8.6-50 MG tablet Take 1 tablet by mouth daily as needed (constipation).    . topiramate (TOPAMAX) 25 MG tablet Take 2 tablets (50 mg total) by mouth 2 (two) times daily. 120 tablet 5  . MODERNA COVID-19 VACCINE 100 MCG/0.5ML injection      Current Facility-Administered Medications on File Prior to Visit  Medication Dose Route Frequency Provider Last Rate Last Admin  . lactated ringers infusion    Continuous PRN Chyrel Masson, CRNA   New Bag at 11/15/15 1545   No Known Allergies Social History   Socioeconomic History  . Marital status: Divorced    Spouse name: Not on file  . Number of children: 0  . Years of education: Not on  file  . Highest education level: Not on file  Occupational History  . Not on file  Tobacco Use  . Smoking status: Current Every Day Smoker    Packs/day: 0.50    Years: 40.00    Pack years: 20.00    Types: Cigarettes  . Smokeless tobacco: Never Used  . Tobacco comment: 06/05/17 1 pack per 1 1/2 days  Substance and Sexual Activity  . Alcohol use: Yes    Comment: occasionally  . Drug use: Yes    Types: Cocaine    Comment: patient states none- read lab report 09/02/2012/ 11-20-12 case cx.due to positive screen test-pt. advised no recreational drugs to be used  . Sexual activity: Not on file  Other Topics Concern  . Not on file  Social  History Narrative   Lives with brother and his sister in law.     Social Determinants of Health   Financial Resource Strain: Not on file  Food Insecurity: Not on file  Transportation Needs: Not on file  Physical Activity: Not on file  Stress: Not on file  Social Connections: Not on file  Intimate Partner Violence: Not on file     Review of Systems  All other systems reviewed and are negative.      Objective:   Physical Exam Vitals reviewed.  Constitutional:      General: He is not in acute distress.    Appearance: He is obese. He is not ill-appearing, toxic-appearing or diaphoretic.  Cardiovascular:     Rate and Rhythm: Normal rate and regular rhythm.     Heart sounds: Normal heart sounds.  Pulmonary:     Effort: Pulmonary effort is normal. No respiratory distress.     Breath sounds: Normal breath sounds. No wheezing, rhonchi or rales.  Musculoskeletal:     Right lower leg: No edema.     Left lower leg: No edema.  Neurological:     Mental Status: He is alert.           Assessment & Plan:  Syncope, unspecified syncope type - Plan: Ambulatory referral to Cardiology  Patient may have certainly had a urinary tract infection with his recent surgery.  That coupled with dehydration may have caused orthostatic hypotension.  However  that has been treated with antibiotics.  He may also have relative hypotension secondary to medication as his blood pressure is relatively low today.  Of asked him to start checking his blood pressure at home and if consistently 446 or less systolic I would have him hold the amlodipine.  Is also possible he had a coronary vasospasm related to the cocaine.  However I believe this is less likely given the fact it was 8 hours after he had used drugs to when he had a syncopal episode.  Given his age, morbid obesity, diabetes, and obstructive sleep apnea, I recommended a cardiology consultation for an event monitor to rule out underlying cardiac arrhythmias moving forward.  Repeat urinalysis next week to ensure resolution of urinary tract infection

## 2020-08-05 DIAGNOSIS — G4733 Obstructive sleep apnea (adult) (pediatric): Secondary | ICD-10-CM | POA: Diagnosis not present

## 2020-08-10 ENCOUNTER — Telehealth: Payer: Self-pay | Admitting: Pharmacist

## 2020-08-10 NOTE — Progress Notes (Addendum)
Chronic Care Management Pharmacy Assistant   Name: Kenneth [name]  MRN: [id number] DOB: [birth year]  Kenneth [name] is an 60 y.o. year old male who presents for his initial CCM visit with the clinical pharmacist.  Reason for Encounter: Chart Prep    Conditions to be addressed/monitored: HTN, GERD, Type DM II, HLD, Obesity.  Primary concerns for visit include: HTN and DM II.  Recent office visits:  08/02/20 Dr. Dennard Schaumann For syncope. Per note: Patient is to hold amlodipine.The doctor recommended a cardiology consultation for an event monitor to rule out underlying cardiac arrhythmias moving forward.   05/11/20 Dr. Dennard Schaumann For right knee pain. STARTED Oxycodone-Acetaminophen 7.5-325 mg 1 tablet every 4 hours PRN. Per note: The doctor injected the joint space with 2 cc lidocaine, 2 cc of Marcaine, and 2 cc of 40 mg/mL Kenalog.   02/27/20 Dr. Buelah Manis For ER follow-up. STOPPED Cephalexin, Omeprazole, Prednisone, Sulfamethoxazole.  Recent consult visits: 07/29/20 Mar Daring, PA-C. For syncope. No medication changes.  07/21/20 Orthopedic Surgery Jacqualine Mau, PA-C. For right knee arthroscopy. No medication changes.   07/07/20 Orthopedic Surgery Leandrew Koyanagi, MD. For osteoarthritis of left knee. No medication changes.  04/05/20  Orthopaedic Rip Harbour, [place] 03/29/20 Ophthalmology Hortencia Pilar.  Hospital visits:  07/29/20 Hendricks Milo U, DO. [facility] (28 Hours) For Syncope. No medication changes.  07/14/20 Leandrew Koyanagi, [facility]. (5 hours) For Right knee partial lateral meniscectomy.  05/08/20 Pattricia Boss, MD. [facility]. ( 2 hours) For right knee pain. No medication changes.  02/17/20 Veryl Speak, [facility] ER Department ( 6 hours). For Bells Palsy. STARTED Prednisone 20 mg and Valacyclovir 1,000 mg. Per note: EKG preformed, CT preformed, Labs drawn.    Medication  History: Atorvastatin 10 mg 90 DS 05/19/20 Metformin 1000 mg 90 DS 07/27/20  Medications: Outpatient Encounter Medications as of 08/10/2020  Medication Sig   AIMOVIG 70 MG/ML SOAJ INJECT 70 MG INTO THE SKIN EVERY 30 DAYS.   albuterol (VENTOLIN HFA) 108 (90 Base) MCG/ACT inhaler INHALE 2 PUFFS INTO THE LUNGS EVERY 4 HOURS AS NEEDED FOR SHORTNESS OF BREATH (Patient taking differently: Inhale 2 puffs into the lungs every 4 (four) hours as needed for wheezing or shortness of breath.)   amLODipine (NORVASC) 10 MG tablet TAKE 1 TABLET BY MOUTH DAILY. (Patient taking differently: Take 10 mg by mouth in the morning.)   aspirin 81 MG chewable tablet Chew 81 mg by mouth in the morning.   atorvastatin (LIPITOR) 10 MG tablet TAKE 1 TABLET BY MOUTH DAILY. (Patient taking differently: Take 10 mg by mouth in the morning.)   blood glucose meter kit and supplies KIT Dispense based on patient and insurance preference. Use up to four times daily as directed.   Blood Glucose Monitoring Suppl (ACCU-CHEK AVIVA PLUS) w/Device KIT Use as directed to monitor FSBS 2x daily. Dx: E11.9   cephALEXin (KEFLEX) 500 MG capsule Take 1 capsule (500 mg total) by mouth every 12 (twelve) hours.   Glycerin-Hypromellose-PEG 400 (VISINE DRY EYE) 0.2-0.2-1 % SOLN Place 1-2 drops into both eyes 3 (three) times daily as needed (dry/irritated eyes.).   HYDROcodone-acetaminophen (NORCO) 5-325 MG tablet Take 1-2 tablets by mouth 2 (two) times daily as needed. (Patient taking differently: Take 1-2 tablets by mouth 2 (two) times daily as needed for moderate pain.)   metFORMIN (GLUCOPHAGE) 1000 MG tablet TAKE 1 TABLET BY MOUTH 2 TIMES DAILY WITH  A MEAL. (Patient taking differently: Take 1,000 mg by mouth 2 (two) times daily with a meal.)   MODERNA COVID-19 VACCINE 100 MCG/0.5ML injection    mupirocin ointment (BACTROBAN) 2 % Apply 1 application topically 2 (two) times daily. (Patient taking differently: Apply 1 application topically 2 (two) times  daily as needed (skin irritation).)   neomycin-polymyxin b-dexamethasone (MAXITROL) 3.5-10000-0.1 SUSP Place 1 drop into the left eye in the morning, at noon, in the evening, and at bedtime.   nitroGLYCERIN (NITROSTAT) 0.4 MG SL tablet Place 1 tablet (0.4 mg total) under the tongue every 5 (five) minutes as needed. For chest pain (Patient taking differently: Place 0.4 mg under the tongue every 5 (five) minutes x 3 doses as needed for chest pain.)   omeprazole (PRILOSEC) 40 MG capsule TAKE 1 CAPSULE BY MOUTH DAILY. (Patient taking differently: Take 40 mg by mouth in the morning.)   ondansetron (ZOFRAN) 4 MG tablet Take 1-2 tablets (4-8 mg total) by mouth every 8 (eight) hours as needed for nausea or vomiting.   senna-docusate (SENOKOT-S) 8.6-50 MG tablet Take 1 tablet by mouth daily as needed (constipation).   topiramate (TOPAMAX) 25 MG tablet Take 2 tablets (50 mg total) by mouth 2 (two) times daily.   Facility-Administered Encounter Medications as of 08/10/2020  Medication   lactated ringers infusion   Have you seen any other providers since your last visit? Patient stated no.  Any changes in your medications or health? Patient stated no.  Any side effects from any medications? Patient stated no.  Do you have an symptoms or problems not managed by your medications? Patient stated no.  Any concerns about your health right now? Patient stated he is concerned about his knees.  Has your provider asked that you check blood pressure, blood sugar, or follow special diet at home? Patient stated yes to all three.   Do you get any type of exercise on a regular basis? Patient stated he walks daily for about 1 hour.   Can you think of a goal you would like to reach for your health? Patient stated he would like to be able to breathe better.   Do you have any problems getting your medications? Patient stated no.  Is there anything that you would like to discuss during the appointment? Patient stated  no.  Please bring medications and supplements to appointment, patient reminded of his face to face appointment on 08/13/20 at 11 am.  Follow-Up:Pharmacist Review  Veronica Mclemore, RMA Clinical Pharmacist Assistant [phone]  

## 2020-08-12 NOTE — Progress Notes (Signed)
Chronic Care Management Pharmacy Note  08/13/2020 Name:  Kenneth [name] MRN:  [id number] DOB:  07-13-1960  Subjective: Kenneth [name] is an 60 y.o. year old male who is a primary patient of Pickard, Cammie Mcgee, MD.  The CCM team was consulted for assistance with disease management and care coordination needs.    Engaged with patient face to face for initial visit in response to provider referral for pharmacy case management and/or care coordination services.   Consent to Services:  The patient was given the following information about Chronic Care Management services today, agreed to services, and gave verbal consent: 1. CCM service includes personalized support from designated clinical staff supervised by the primary care provider, including individualized plan of care and coordination with other care providers 2. 24/7 contact phone numbers for assistance for urgent and routine care needs. 3. Service will only be billed when office clinical staff spend 20 minutes or more in a month to coordinate care. 4. Only one practitioner may furnish and bill the service in a calendar month. 5.The patient may stop CCM services at any time (effective at the end of the month) by phone call to the office staff. 6. The patient will be responsible for cost sharing (co-pay) of up to 20% of the service fee (after annual deductible is met). Patient agreed to services and consent obtained.  Patient Care Team: [name], MD as PCP - General (Family Medicine) Edythe Clarity, [facility] as Pharmacist (Pharmacist)  Recent office visits: 08/02/20 Dr. Dennard Schaumann For syncope. Per note: Patient is to hold amlodipine.The doctor recommended a cardiology consultation for an event monitor to rule out underlying cardiac arrhythmias moving forward.  05/11/20 Dr. Dennard Schaumann For right knee pain. STARTED Oxycodone-Acetaminophen 7.5-325 mg 1 tablet every 4 hours PRN. Per note: The doctor injected the joint space with 2 cc lidocaine, 2 cc of  Marcaine, and 2 cc of 40 mg/mL Kenalog.  02/27/20 Dr. Buelah Manis For ER follow-up. STOPPED Cephalexin, Omeprazole, Prednisone, Sulfamethoxazole.  Recent consult visits: 07/29/20 Mar Daring, PA-C. For syncope. No medication changes.  07/21/20 Orthopedic Surgery Jacqualine Mau, PA-C. For right knee arthroscopy. No medication changes.   07/07/20 Orthopedic Surgery Leandrew Koyanagi, MD. For osteoarthritis of left knee. No medication changes.  04/05/20  Orthopaedic Rip Harbour, [place] 03/29/20 Ophthalmology Hortencia Pilar.  Hospital visits:  07/29/20 Hendricks Milo U, DO. [facility] (28 Hours) For Syncope. No medication changes.  07/14/20 Leandrew Koyanagi, [facility]. (5 hours) For Right knee partial lateral meniscectomy.  05/08/20 Pattricia Boss, MD. [facility] Department. ( 2 hours) For right knee pain. No medication changes.  02/17/20 Veryl Speak, [facility] ER Department ( 6 hours). For Bells Palsy. STARTED Prednisone 20 mg and Valacyclovir 1,000 mg. Per note: EKG preformed, CT preformed, Labs drawn.   Medication History: Atorvastatin 10 mg 90 DS 05/19/20 Metformin 1000 mg 90 DS 07/27/20  Objective:  Lab Results  Component Value Date   CREATININE 0.83 07/29/2020   BUN 17 07/29/2020   GFRNONAA >60 07/29/2020   GFRAA 110 12/18/2019   NA 136 07/29/2020   K 3.9 07/29/2020   CALCIUM 9.4 07/29/2020   CO2 23 07/29/2020   GLUCOSE 122 (H) 07/29/2020    Lab Results  Component Value Date/Time   HGBA1C 7.6 (H) 07/30/2020 02:35 AM   HGBA1C 6.3 (H) 12/18/2019 08:15 AM   HGBA1C >14.0 (H) 01/28/2016 11:42 AM   MICROALBUR 0.6  12/18/2019 08:15 AM   MICROALBUR 1.1 05/22/2017 02:33 PM    Last diabetic Eye exam:  Lab Results  Component Value Date/Time   HMDIABEYEEXA No Retinopathy 03/29/2020 11:24 AM    Last diabetic Foot exam: No results found for: HMDIABFOOTEX   Lab Results  Component Value Date   CHOL  200 07/30/2020   HDL 48 07/30/2020   LDLCALC 134 (H) 07/30/2020   TRIG 92 07/30/2020   CHOLHDL 4.2 07/30/2020    Hepatic Function Latest Ref Rng & Units 02/17/2020 12/18/2019 06/02/2019  Total Protein 6.5 - 8.1 g/dL 8.0 8.0 7.0  Albumin 3.5 - 5.0 g/dL 3.8 - -  AST 15 - 41 U/L 19 15 15  ALT 0 - 44 U/L 22 18 18  Alk Phosphatase 38 - 126 U/L 65 - -  Total Bilirubin 0.3 - 1.2 mg/dL 0.2(L) 0.4 0.2    No results found for: TSH, FREET4  CBC Latest Ref Rng & Units 07/29/2020 07/14/2020 07/14/2020  WBC 4.0 - 10.5 K/uL 8.6 - 9.3  Hemoglobin 13.0 - 17.0 g/dL 14.2 16.3 16.0  Hematocrit 39.0 - 52.0 % 45.0 48.0 49.9  Platelets 150 - 400 K/uL 311 - 349    Lab Results  Component Value Date/Time   VD25OH 11 (L) 05/12/2009 12:33 AM    Clinical ASCVD: No  The 10-year ASCVD risk score (Goff DC Jr., et al., 2013) is: 32%   Values used to calculate the score:     Age: 60 years     Sex: Male     Is Non-Hispanic African American: Yes     Diabetic: Yes     Tobacco smoker: Yes     Systolic Blood Pressure: 114 mmHg     Is BP treated: Yes     HDL Cholesterol: 48 mg/dL     Total Cholesterol: 200 mg/dL    Depression screen PHQ 2/9 11/03/2019 05/22/2017 11/14/2016  Decreased Interest 0 0 0  Down, Depressed, Hopeless 0 0 0  PHQ - 2 Score 0 0 0      Social History   Tobacco Use  Smoking Status Current Every Day Smoker  . Packs/day: 0.50  . Years: 40.00  . Pack years: 20.00  . Types: Cigarettes  Smokeless Tobacco Never Used  Tobacco Comment   06/05/17 1 pack per 1 1/2 days   BP Readings from Last 3 Encounters:  08/02/20 114/72  07/30/20 130/75  07/14/20 131/76   Pulse Readings from Last 3 Encounters:  08/02/20 98  07/30/20 76  07/14/20 84   Wt Readings from Last 3 Encounters:  08/02/20 280 lb (127 kg)  07/29/20 279 lb 15.8 oz (127 kg)  07/14/20 280 lb (127 kg)   BMI Readings from Last 3 Encounters:  08/02/20 45.19 kg/m  07/29/20 45.19 kg/m  07/14/20 45.19 kg/m     Assessment/Interventions: Review of patient past medical history, allergies, medications, health status, including review of consultants reports, laboratory and other test data, was performed as part of comprehensive evaluation and provision of chronic care management services.   SDOH:  (Social Determinants of Health) assessments and interventions performed: Yes   Financial Resource Strain: Low Risk   . Difficulty of Paying Living Expenses: Not very hard    Financial Resource Strain: Low Risk   . Difficulty of Paying Living Expenses: Not very hard    SDOH Screenings   Alcohol Screen: Not on file  Depression (PHQ2-9): Low Risk   . PHQ-2 Score: 0  Financial Resource Strain: Low Risk   .   Difficulty of Paying Living Expenses: Not very hard  Food Insecurity: Not on file  Housing: Not on file  Physical Activity: Not on file  Social Connections: Not on file  Stress: Not on file  Tobacco Use: High Risk  . Smoking Tobacco Use: Current Every Day Smoker  . Smokeless Tobacco Use: Never Used  Transportation Needs: Not on file    CCM Care Plan  No Known Allergies  Medications Reviewed Today    Reviewed by Davis, Christian L, RPH (Pharmacist) on 08/13/20 at 1147  Med List Status: <None>  Medication Order Taking? Sig Documenting Provider Last Dose Status Informant  AIMOVIG 70 MG/ML SOAJ [id number] Yes INJECT 70 MG INTO THE SKIN EVERY 30 DAYS. Pickard, Warren T, MD Taking Active Self  albuterol (VENTOLIN HFA) 108 (90 Base) MCG/ACT inhaler [id number] Yes INHALE 2 PUFFS INTO THE LUNGS EVERY 4 HOURS AS NEEDED FOR SHORTNESS OF BREATH  Patient taking differently: Inhale 2 puffs into the lungs every 4 (four) hours as needed for wheezing or shortness of breath.   Pickard, Warren T, MD Taking Active   amLODipine (NORVASC) 10 MG tablet [id number] Yes TAKE 1 TABLET BY MOUTH DAILY.  Patient taking differently: Take 10 mg by mouth in the morning.   Pickard, Warren T, MD Taking Active   aspirin 81  MG chewable tablet [id number] Yes Chew 81 mg by mouth in the morning. [provider] Taking Active Self  atorvastatin (LIPITOR) 10 MG tablet [id number] Yes TAKE 1 TABLET BY MOUTH DAILY.  Patient taking differently: Take 10 mg by mouth in the morning.   Pickard, Warren T, MD Taking Active   blood glucose meter kit and supplies KIT [id number] Yes Dispense based on patient and insurance preference. Use up to four times daily as directed. Pickard, Warren T, MD Taking Active   Blood Glucose Monitoring Suppl (ACCU-CHEK AVIVA PLUS) w/Device KIT [id number] Yes Use as directed to monitor FSBS 2x daily. Dx: E11.9 Pickard, Warren T, MD Taking Active Self  cephALEXin (KEFLEX) 500 MG capsule [id number] Yes Take 1 capsule (500 mg total) by mouth every 12 (twelve) hours. Vann, Jessica U, DO Taking Active   Glycerin-Hypromellose-PEG 400 (VISINE DRY EYE) 0.2-0.2-1 % SOLN [id number] Yes Place 1-2 drops into both eyes 3 (three) times daily as needed (dry/irritated eyes.). [provider] Taking Active Self  HYDROcodone-acetaminophen (NORCO) 5-325 MG tablet [id number] Yes Take 1-2 tablets by mouth 2 (two) times daily as needed.  Patient taking differently: Take 1-2 tablets by mouth 2 (two) times daily as needed for moderate pain.   Xu, Naiping M, MD Taking Active Self  lactated ringers infusion [id number]   Ouellette, Richard G, CRNA  Active   metFORMIN (GLUCOPHAGE) 1000 MG tablet [id number] Yes TAKE 1 TABLET BY MOUTH 2 TIMES DAILY WITH A MEAL.  Patient taking differently: Take 1,000 mg by mouth 2 (two) times daily with a meal.   Pickard, Warren T, MD Taking Active Self  MODERNA COVID-19 VACCINE 100 MCG/0.5ML injection [id number] Yes  [provider] Taking Active   mupirocin ointment (BACTROBAN) 2 % [id number] Yes Apply 1 application topically 2 (two) times daily.  Patient taking differently: Apply 1 application topically 2 (two) times daily as needed (skin irritation).   Wagoner, Matthew R, DPM  Taking Active   neomycin-polymyxin b-dexamethasone (MAXITROL) 3.5-10000-0.1 SUSP [id number] Yes Place 1 drop into the left eye in the morning, at noon, in the evening, and at bedtime. [provider] Taking Active Self  nitroGLYCERIN (NITROSTAT) 0.4 MG SL tablet   [id number] Yes Place 1 tablet (0.4 mg total) under the tongue every 5 (five) minutes as needed. For chest pain  Patient taking differently: Place 0.4 mg under the tongue every 5 (five) minutes x 3 doses as needed for chest pain.   [place], Kawanta F, MD Taking Active   omeprazole (PRILOSEC) 40 MG capsule [id number] Yes TAKE 1 CAPSULE BY MOUTH DAILY.  Patient taking differently: Take 40 mg by mouth in the morning.   Pickard, Warren T, MD Taking Active   ondansetron (ZOFRAN) 4 MG tablet [id number] Yes Take 1-2 tablets (4-8 mg total) by mouth every 8 (eight) hours as needed for nausea or vomiting. Xu, Naiping M, MD Taking Active Self  senna-docusate (SENOKOT-S) 8.6-50 MG tablet [id number] Yes Take 1 tablet by mouth daily as needed (constipation). [provider] Taking Active Self  topiramate (TOPAMAX) 25 MG tablet [id number] Yes Take 2 tablets (50 mg total) by mouth 2 (two) times daily. Pickard, Warren T, MD Taking Active Self  Med List Note (Harris, Yolinda T, CPhT 07/09/20 1004): Cpap at bedtime          Patient Active Problem List   Diagnosis Date Noted  . Mixed diabetic hyperlipidemia associated with type 2 diabetes mellitus (HCC) 07/29/2020  . Mild intermittent asthma without complication 07/29/2020  . Essential hypertension 07/29/2020  . GERD without esophagitis 07/29/2020  . Acute cystitis without hematuria 07/29/2020  . Bilateral pulmonary infiltrates on CXR 07/29/2020  . Acute lateral meniscus tear of right knee 07/07/2020  . Type 2 diabetes mellitus without complication, without long-term current use of insulin (HCC) 02/25/2019  . OSA treated with BiPAP   . Syncope 05/13/2014  . Asthma   . Knee pain   . Chest  pain 10/23/2011  . Dysphagia 10/23/2011  . Obesity 10/23/2011    Immunization History  Administered Date(s) Administered  . Influenza,inj,Quad PF,6+ Mos 12/25/2018, 01/26/2020  . Moderna Sars-Covid-2 Vaccination 05/25/2019, 06/28/2019  . Pneumococcal Polysaccharide-23 08/25/2016  . Tdap 08/25/2016    Conditions to be addressed/monitored:  HTN, Asthma, GERD, Type II DM, HLD  Care Plan : General Pharmacy (Adult)  Updates made by Davis, Christian L, RPH since 08/13/2020 12:00 AM    Problem: HTN, Asthma, GERD, Type II DM, HLD   Priority: High  Onset Date: 08/13/2020    Long-Range Goal: Patient-Specific Goal   Start Date: 08/13/2020  Expected End Date: 02/13/2021  This Visit's Progress: On track  Priority: High  Note:   Current Barriers:  . Unable to achieve control of glucose  . Suboptimal therapeutic regimen for cholesterol  Pharmacist Clinical Goal(s):  . Patient will achieve control of glucose and LDL as evidenced by monitoring . adhere to plan to optimize therapeutic regimen for cholesterol as evidenced by report of adherence to recommended medication management changes . contact provider office for questions/concerns as evidenced notation of same in electronic health record through collaboration with PharmD and provider.   Interventions: . 1:1 collaboration with Pickard, Warren T, MD regarding development and update of comprehensive plan of care as evidenced by provider attestation and co-signature . Inter-disciplinary care team collaboration (see longitudinal plan of care) . Comprehensive medication review performed; medication list updated in electronic medical record  Hypertension (BP goal <130/80) -Controlled -Current treatment: . Amlodipine 5mg daily -Medications previously tried: none noted   -Current home readings: 113/67-133/73 -Current dietary habits: patient has stopped drinking alcohol and is working on cutting back on breads and other carbs -Current  exercise habits: does something active daily -Denies hypotensive/hypertensive   symptoms -Educated on BP goals and benefits of medications for prevention of heart attack, stroke and kidney damage; Exercise goal of 150 minutes per week; Importance of home blood pressure monitoring; Symptoms of hypotension and importance of maintaining adequate hydration; -Counseled to monitor BP at home daily, document, and provide log at future appointments -Recommended to continue current medication  Hyperlipidemia: (LDL goal < 70) -Uncontrolled -Current treatment: . Atorvastatin 10m daily -Medications previously tried: none noted  -Current dietary patterns: see above -Current exercise habits: see above -Educated on Cholesterol goals;  Benefits of statin for ASCVD risk reduction; Importance of limiting foods high in cholesterol; Exercise goal of 150 minutes per week;  -Reviewed most recent lipid panel, LDL elevated at 134 he tolerates Atorvastatin 124mfind with no concerns.  ASCVD risk is 32% making him a candidate for a high intensity statin. -Collaborated with PCP, would recommend increasing to Atorvastatin 4041maily to get to goal LDL.  Diabetes (A1c goal <7%) -Not ideally controlled -Current medications: . MMarland Kitchentformin 1000m46mice daily with meals -Medications previously tried: none noted  -Current home glucose readings . fasting glucose: 108-165 . post prandial glucose: 102-187 -Denies hypoglycemic/hyperglycemic symptoms -Current meal patterns: see above . drinks: water, coke zero -Current exercise: see above -Educated on A1c and blood sugar goals; Complications of diabetes including kidney damage, retinal damage, and cardiovascular disease; Prevention and management of hypoglycemic episodes; Benefits of routine self-monitoring of blood sugar; -Counseled to check feet daily and get yearly eye exams -Recommended to continue current medication  -Continue to work on lifestyle changes,  recommend repeat A1c around 3 months  Asthma (Goal: control symptoms and prevent exacerbations) -Not ideally controlled -Current treatment  . Albuterol HFA 90mc13mn -Medications previously tried: none noted   -Exacerbations requiring treatment in last 6 months: none -Patient denies consistent use of maintenance inhaler -Frequency of rescue inhaler use: as needed, usually during strenuous activity -Counseled on Proper inhaler technique; Benefits of consistent maintenance inhaler use  -Reports sometimes he will just be sitting and breathing heavy -Recommended to continue current medication Could consider controller medication such as Dulera for maintenance.  GERD (Goal: Minimize symptoms) -Controlled -Current treatment  . Omeprazole 40mg 70my -Medications previously tried: none noted -Denies any symptoms and takes appropriately  -Recommended to continue current medication   Patient Goals/Self-Care Activities . Patient will:  - take medications as prescribed focus on medication adherence by pill box check glucose daily, document, and provide at future appointments check blood pressure daily, document, and provide at future appointments target a minimum of 150 minutes of moderate intensity exercise weekly  Follow Up Plan: The care management team will reach out to the patient again over the next 60 days.         Medication Assistance: None required.  Patient affirms current coverage meets needs.  Patient[facility]:  [facility] [address]: [phone] [facility] [address] [address]: [phone] pill box? Yes Pt endorses 100% compliance  We discussed: Benefits of medication synchronization, packaging and delivery as well as enhanced pharmacist oversight with  [employer]. Patient decided to: Continue current medication management strategy  Care Plan and Follow Up Patient Decision:  Patient agrees to Care Plan and Follow-up.  Plan: The care management team will reach out to the patient again over the next 60 days.  Christian Davis, PharmD Clinical Pharmacist [facility] [phone]   

## 2020-08-13 ENCOUNTER — Ambulatory Visit (INDEPENDENT_AMBULATORY_CARE_PROVIDER_SITE_OTHER): Payer: Medicare Other | Admitting: Pharmacist

## 2020-08-13 ENCOUNTER — Other Ambulatory Visit: Payer: Self-pay

## 2020-08-13 ENCOUNTER — Encounter: Payer: Self-pay | Admitting: Orthopaedic Surgery

## 2020-08-13 ENCOUNTER — Other Ambulatory Visit: Payer: Medicare Other

## 2020-08-13 DIAGNOSIS — E119 Type 2 diabetes mellitus without complications: Secondary | ICD-10-CM | POA: Diagnosis not present

## 2020-08-13 DIAGNOSIS — E782 Mixed hyperlipidemia: Secondary | ICD-10-CM

## 2020-08-13 DIAGNOSIS — K219 Gastro-esophageal reflux disease without esophagitis: Secondary | ICD-10-CM

## 2020-08-13 DIAGNOSIS — R300 Dysuria: Secondary | ICD-10-CM | POA: Diagnosis not present

## 2020-08-13 DIAGNOSIS — J45909 Unspecified asthma, uncomplicated: Secondary | ICD-10-CM | POA: Diagnosis not present

## 2020-08-13 DIAGNOSIS — I10 Essential (primary) hypertension: Secondary | ICD-10-CM | POA: Diagnosis not present

## 2020-08-13 DIAGNOSIS — E1169 Type 2 diabetes mellitus with other specified complication: Secondary | ICD-10-CM

## 2020-08-13 NOTE — Patient Instructions (Addendum)
Visit Information  Goals Addressed            This Visit's Progress   . Monitor and Manage My Blood Sugar-Diabetes Type 2       Timeframe:  Long-Range Goal Priority:  High Start Date: 08/13/20                            Expected End Date: 02/13/21                      Follow Up Date 11/24/20   - check blood sugar at prescribed times - check blood sugar before and after exercise - enter blood sugar readings and medication or insulin into daily log - take the blood sugar log to all doctor visits    Why is this important?    Checking your blood sugar at home helps to keep it from getting very high or very low.   Writing the results in a diary or log helps the doctor know how to care for you.   Your blood sugar log should have the time, date and the results.   Also, write down the amount of insulin or other medicine that you take.   Other information, like what you ate, exercise done and how you were feeling, will also be helpful.     Notes:       Patient Care Plan: General Pharmacy (Adult)    Problem Identified: HTN, Asthma, GERD, Type II DM, HLD   Priority: High  Onset Date: 08/13/2020    Long-Range Goal: Patient-Specific Goal   Start Date: 08/13/2020  Expected End Date: 02/13/2021  This Visit's Progress: On track  Priority: High  Note:   Current Barriers:  . Unable to achieve control of glucose  . Suboptimal therapeutic regimen for cholesterol  Pharmacist Clinical Goal(s):  Marland Kitchen Patient will achieve control of glucose and LDL as evidenced by monitoring . adhere to plan to optimize therapeutic regimen for cholesterol as evidenced by report of adherence to recommended medication management changes . contact provider office for questions/concerns as evidenced notation of same in electronic health record through collaboration with PharmD and provider.   Interventions: . 1:1 collaboration with [name], MD regarding development and update of comprehensive plan  of care as evidenced by provider attestation and co-signature . Inter-disciplinary care team collaboration (see longitudinal plan of care) . Comprehensive medication review performed; medication list updated in electronic medical record  Hypertension (BP goal <130/80) -Controlled -Current treatment: . Amlodipine 5mg  daily -Medications previously tried: none noted   -Current home readings: 113/67-133/73 -Current dietary habits: patient has stopped drinking alcohol and is working on cutting back on breads and other carbs -Current exercise habits: does something active daily -Denies hypotensive/hypertensive symptoms -Educated on BP goals and benefits of medications for prevention of heart attack, stroke and kidney damage; Exercise goal of 150 minutes per week; Importance of home blood pressure monitoring; Symptoms of hypotension and importance of maintaining adequate hydration; -Counseled to monitor BP at home daily, document, and provide log at future appointments -Recommended to continue current medication  Hyperlipidemia: (LDL goal < 70) -Uncontrolled -Current treatment: . Atorvastatin 10mg  daily -Medications previously tried: none noted  -Current dietary patterns: see above -Current exercise habits: see above -Educated on Cholesterol goals;  Benefits of statin for ASCVD risk reduction; Importance of limiting foods high in cholesterol; Exercise goal of 150 minutes per week;  -Reviewed most recent lipid panel,  LDL elevated at 134 he tolerates Atorvastatin 10mg  find with no concerns.  ASCVD risk is 32% making him a candidate for a high intensity statin. -Collaborated with PCP, would recommend increasing to Atorvastatin 40mg  daily to get to goal LDL.  Diabetes (A1c goal <7%) -Not ideally controlled -Current medications: Metformin 1000mg  twice daily with meals -Medications previously tried: none noted  -Current home glucose readings . fasting glucose: 108-165 . post prandial  glucose: 102-187 -Denies hypoglycemic/hyperglycemic symptoms -Current meal patterns: see above . drinks: water, coke zero -Current exercise: see above -Educated on A1c and blood sugar goals; Complications of diabetes including kidney damage, retinal damage, and cardiovascular disease; Prevention and management of hypoglycemic episodes; Benefits of routine self-monitoring of blood sugar; -Counseled to check feet daily and get yearly eye exams -Recommended to continue current medication  -Continue to work on lifestyle changes, recommend repeat A1c around 3 months  Asthma (Goal: control symptoms and prevent exacerbations) -Not ideally controlled -Current treatment  . Albuterol HFA prn -Medications previously tried: none noted   -Exacerbations requiring treatment in last 6 months: none -Patient denies consistent use of maintenance inhaler -Frequency of rescue inhaler use: as needed, usually during strenuous activity -Counseled on Proper inhaler technique; Benefits of consistent maintenance inhaler use  -Reports sometimes he will just be sitting and breathing heavy -Recommended to continue current medication Could consider controller medication such as Dulera for maintenance.  GERD (Goal: Minimize symptoms) -Controlled -Current treatment  . Omeprazole 40mg  daily -Medications previously tried: none noted -Denies any symptoms and takes appropriately  -Recommended to continue current medication   Patient Goals/Self-Care Activities . Patient will:  - take medications as prescribed focus on medication adherence by pill box check glucose daily, document, and provide at future appointments check blood pressure daily, document, and provide at future appointments target a minimum of 150 minutes of moderate intensity exercise weekly  Follow Up Plan: The care management team will reach out to the patient again over the next 60 days.        Mr. Saindon was given information  about Chronic Care Management services today including:  1. CCM service includes personalized support from designated clinical staff supervised by his physician, including individualized plan of care and coordination with other care providers 2. 24/7 contact phone numbers for assistance for urgent and routine care needs. 3. Standard insurance, coinsurance, copays and deductibles apply for chronic care management only during months in which we provide at least 20 minutes of these services. Most insurances cover these services at 100%, however patients may be responsible for any copay, coinsurance and/or deductible if applicable. This service may help you avoid the need for more expensive face-to-face services. 4. Only one practitioner may furnish and bill the service in a calendar month. 5. The patient may stop CCM services at any time (effective at the end of the month) by phone call to the office staff.  Patient agreed to services and verbal consent obtained.   The patient verbalized understanding of instructions, educational materials, and care plan provided today and agreed to receive a mailed copy of patient instructions, educational materials, and care plan.  Telephone follow up appointment with pharmacy team member scheduled for: 60 days  , [facility]  Diabetes Mellitus and Exercise Exercising regularly is important for overall health, especially for people who have diabetes mellitus. Exercising is not only about losing weight. It has many other health benefits, such as increasing muscle strength and bone density and reducing body fat  and stress. This leads to improved fitness, flexibility, and endurance, all of which result in better overall health. What are the benefits of exercise if I have diabetes? Exercise has many benefits for people with diabetes. They include:  Helping to lower and control blood sugar (glucose).  Helping the body to respond better to the hormone insulin  by improving insulin sensitivity.  Reducing how much insulin the body needs.  Lowering the risk for heart disease by: ? Lowering "bad" cholesterol and triglyceride levels. ? Increasing "good" cholesterol levels. ? Lowering blood pressure. ? Lowering blood glucose levels. What is my activity plan? Your health care provider or certified diabetes educator can help you make a plan for the type and frequency of exercise that works for you. This is called your activity plan. Be sure to:  Get at least 150 minutes of medium-intensity or high-intensity exercise each week. Exercises may include brisk walking, biking, or water aerobics.  Do stretching and strengthening exercises, such as yoga or weight lifting, at least 2 times a week.  Spread out your activity over at least 3 days of the week.  Get some form of physical activity each day. ? Do not go more than 2 days in a row without some kind of physical activity. ? Avoid being inactive for more than 90 minutes at a time. Take frequent breaks to walk or stretch.  Choose exercises or activities that you enjoy. Set realistic goals.  Start slowly and gradually increase your exercise intensity over time.   How do I manage my diabetes during exercise? Monitor your blood glucose  Check your blood glucose before and after exercising. If your blood glucose is: ? 240 mg/dL (13.3 mmol/L) or higher before you exercise, check your urine for ketones. These are chemicals created by the liver. If you have ketones in your urine, do not exercise until your blood glucose returns to normal. ? 100 mg/dL (5.6 mmol/L) or lower, eat a snack containing 15-20 grams of carbohydrate. Check your blood glucose 15 minutes after the snack to make sure that your glucose level is above 100 mg/dL (5.6 mmol/L) before you start your exercise.  Know the symptoms of low blood glucose (hypoglycemia) and how to treat it. Your risk for hypoglycemia increases during and after  exercise. Follow these tips and your health care provider's instructions  Keep a carbohydrate snack that is fast-acting for use before, during, and after exercise to help prevent or treat hypoglycemia.  Avoid injecting insulin into areas of the body that are going to be exercised. For example, avoid injecting insulin into: ? Your arms, when you are about to play tennis. ? Your legs, when you are about to go jogging.  Keep records of your exercise habits. Doing this can help you and your health care provider adjust your diabetes management plan as needed. Write down: ? Food that you eat before and after you exercise. ? Blood glucose levels before and after you exercise. ? The type and amount of exercise you have done.  Work with your health care provider when you start a new exercise or activity. He or she may need to: ? Make sure that the activity is safe for you. ? Adjust your insulin, other medicines, and food that you eat.  Drink plenty of water while you exercise. This prevents loss of water (dehydration) and problems caused by a lot of heat in the body (heat stroke).   Where to find more information  American Diabetes Association: www.diabetes.org Summary  Exercising regularly is important for overall health, especially for people who have diabetes mellitus.  Exercising has many health benefits. It increases muscle strength and bone density and reduces body fat and stress. It also lowers and controls blood glucose.  Your health care provider or certified diabetes educator can help you make an activity plan for the type and frequency of exercise that works for you.  Work with your health care provider to make sure any new activity is safe for you. Also work with your health care provider to adjust your insulin, other medicines, and the food you eat. This information is not intended to replace advice given to you by your health care provider. Make sure you discuss any questions you  have with your health care provider. Document Revised: 12/09/2018 Document Reviewed: 12/09/2018 Elsevier Patient Education  2021 [employer].  Diabetes Mellitus and Exercise Exercising regularly is important for overall health, especially for people who have diabetes mellitus. Exercising is not only about losing weight. It has many other health benefits, such as increasing muscle strength and bone density and reducing body fat and stress. This leads to improved fitness, flexibility, and endurance, all of which result in better overall health. What are the benefits of exercise if I have diabetes? Exercise has many benefits for people with diabetes. They include:  Helping to lower and control blood sugar (glucose).  Helping the body to respond better to the hormone insulin by improving insulin sensitivity.  Reducing how much insulin the body needs.  Lowering the risk for heart disease by: ? Lowering "bad" cholesterol and triglyceride levels. ? Increasing "good" cholesterol levels. ? Lowering blood pressure. ? Lowering blood glucose levels. What is my activity plan? Your health care provider or certified diabetes educator can help you make a plan for the type and frequency of exercise that works for you. This is called your activity plan. Be sure to:  Get at least 150 minutes of medium-intensity or high-intensity exercise each week. Exercises may include brisk walking, biking, or water aerobics.  Do stretching and strengthening exercises, such as yoga or weight lifting, at least 2 times a week.  Spread out your activity over at least 3 days of the week.  Get some form of physical activity each day. ? Do not go more than 2 days in a row without some kind of physical activity. ? Avoid being inactive for more than 90 minutes at a time. Take frequent breaks to walk or stretch.  Choose exercises or activities that you enjoy. Set realistic goals.  Start slowly and gradually increase your  exercise intensity over time.   How do I manage my diabetes during exercise? Monitor your blood glucose  Check your blood glucose before and after exercising. If your blood glucose is: ? 240 mg/dL (13.3 mmol/L) or higher before you exercise, check your urine for ketones. These are chemicals created by the liver. If you have ketones in your urine, do not exercise until your blood glucose returns to normal. ? 100 mg/dL (5.6 mmol/L) or lower, eat a snack containing 15-20 grams of carbohydrate. Check your blood glucose 15 minutes after the snack to make sure that your glucose level is above 100 mg/dL (5.6 mmol/L) before you start your exercise.  Know the symptoms of low blood glucose (hypoglycemia) and how to treat it. Your risk for hypoglycemia increases during and after exercise. Follow these tips and your health care provider's instructions  Keep a carbohydrate snack that is fast-acting for use before,  during, and after exercise to help prevent or treat hypoglycemia.  Avoid injecting insulin into areas of the body that are going to be exercised. For example, avoid injecting insulin into: ? Your arms, when you are about to play tennis. ? Your legs, when you are about to go jogging.  Keep records of your exercise habits. Doing this can help you and your health care provider adjust your diabetes management plan as needed. Write down: ? Food that you eat before and after you exercise. ? Blood glucose levels before and after you exercise. ? The type and amount of exercise you have done.  Work with your health care provider when you start a new exercise or activity. He or she may need to: ? Make sure that the activity is safe for you. ? Adjust your insulin, other medicines, and food that you eat.  Drink plenty of water while you exercise. This prevents loss of water (dehydration) and problems caused by a lot of heat in the body (heat stroke).   Where to find more information  American Diabetes  Association: www.diabetes.org Summary  Exercising regularly is important for overall health, especially for people who have diabetes mellitus.  Exercising has many health benefits. It increases muscle strength and bone density and reduces body fat and stress. It also lowers and controls blood glucose.  Your health care provider or certified diabetes educator can help you make an activity plan for the type and frequency of exercise that works for you.  Work with your health care provider to make sure any new activity is safe for you. Also work with your health care provider to adjust your insulin, other medicines, and the food you eat. This information is not intended to replace advice given to you by your health care provider. Make sure you discuss any questions you have with your health care provider. Document Revised: 12/09/2018 Document Reviewed: 12/09/2018 Elsevier Patient Education  2021 [employer].

## 2020-08-14 LAB — URINALYSIS, ROUTINE W REFLEX MICROSCOPIC
Bilirubin Urine: NEGATIVE
Glucose, UA: NEGATIVE
Hgb urine dipstick: NEGATIVE
Ketones, ur: NEGATIVE
Leukocytes,Ua: NEGATIVE
Nitrite: NEGATIVE
Protein, ur: NEGATIVE
Specific Gravity, Urine: 1.022 (ref 1.001–1.035)
pH: 7 (ref 5.0–8.0)

## 2020-08-14 LAB — URINE CULTURE
MICRO NUMBER:: 11916230
SPECIMEN QUALITY:: ADEQUATE

## 2020-08-16 MED ORDER — ATORVASTATIN CALCIUM 40 MG PO TABS: 40.0000 mg | ORAL_TABLET | Freq: Every day | ORAL | 3 refills | Status: DC

## 2020-08-16 NOTE — Addendum Note (Signed)
Addended by: Edythe Clarity on: 08/16/2020 11:44 AM   Modules accepted: Orders

## 2020-08-16 NOTE — Progress Notes (Signed)
After consultation with Dr. Dennard Schaumann, will increase Atorvastatin to 40mg  daily.  Rx called in to [place] for pickup.  Patient aware.  Beverly Milch, PharmD Clinical Pharmacist [place] [phone]

## 2020-08-25 ENCOUNTER — Ambulatory Visit (INDEPENDENT_AMBULATORY_CARE_PROVIDER_SITE_OTHER): Payer: Medicare Other | Admitting: Physician Assistant

## 2020-08-25 VITALS — Ht 68.0 in | Wt 279.0 lb

## 2020-08-25 DIAGNOSIS — Z9889 Other specified postprocedural states: Secondary | ICD-10-CM

## 2020-08-25 DIAGNOSIS — M1712 Unilateral primary osteoarthritis, left knee: Secondary | ICD-10-CM

## 2020-08-25 NOTE — Progress Notes (Signed)
Office Visit Note   Patient: Kenneth [name]           Date of Birth: 07-15-1960           MRN: [id number] Visit Date: 08/25/2020              Requested by: [name], MD [address] [place],  [place] [address] PCP: [name], MD   Assessment & Plan: Visit Diagnoses:  1. Unilateral primary osteoarthritis, left knee   2. S/P right knee arthroscopy     Plan: Impression is #1 status post right knee arthroscopic debridement lateral meniscus and #2 left knee DJD.  In regards to the right knee, he will continue to increase activity as tolerated.  Follow-up with [name] as needed.  In regards to the left knee, he currently has a BMI of 42.42 and hemoglobin A1c is 7.6.  He needs to lose weight and get 260 pounds in order to obtain a BMI of under 40.0 before proceeding with surgery.  He will follow-up with [name] once she has reached that goal.  Call with concerns or questions in the meantime.  Follow-Up Instructions: Return if symptoms worsen or fail to improve.   Orders:  No orders of the defined types were placed in this encounter.  No orders of the defined types were placed in this encounter.     Procedures: No procedures performed   Clinical Data: No additional findings.   Subjective: Chief Complaint  Patient presents with  . Right Knee - Follow-up    HPI patient is a pleasant 60 year old gentleman who comes in today for follow-up of his right knee.  He is approximately 6 weeks out right knee arthroscopic debridement lateral meniscus and synovectomy.  He is doing well.  He still has some sensitivity with kneeling but nothing more.  He is having more pain at this point to the left knee.  History of moderate DJD.  He has had a cortisone injection in the past without significant relief.  He currently has increased pain with activity and with sleeping.  He is interested in total knee replacement.  Review of Systems as detailed in HPI.  All others reviewed and are  negative.   Objective: Vital Signs: Ht 5\' 8"  (1.727 m)   Wt 279 lb (126.6 kg)   BMI 42.42 kg/m   Physical Exam well-developed well-nourished gentleman in no acute distress.  Alert and oriented x3.  Ortho Exam right knee exam shows fully healed surgical portals without complication.  No effusion.  Range of motion 0 to 130 degrees.  He is neurovascular intact distally.  Stable left knee exam  Specialty Comments:  No specialty comments available.  Imaging: No new imaging   PMFS History: Patient Active Problem List   Diagnosis Date Noted  . Mixed diabetic hyperlipidemia associated with type 2 diabetes mellitus ([place]) 07/29/2020  . Mild intermittent asthma without complication 27/08/2374  . Essential hypertension 07/29/2020  . GERD without esophagitis 07/29/2020  . Acute cystitis without hematuria 07/29/2020  . Bilateral pulmonary infiltrates on CXR 07/29/2020  . Acute lateral meniscus tear of right knee 07/07/2020  . Type 2 diabetes mellitus without complication, without long-term current use of insulin ([name]) 02/25/2019  . OSA treated with BiPAP   . Syncope 05/13/2014  . Asthma   . Knee pain   . Chest pain 10/23/2011  . Dysphagia 10/23/2011  . Obesity 10/23/2011   Past Medical History:  Diagnosis Date  . Arthritis   .  Asthma   . Chronic bronchitis ([place])   . Diabetes mellitus without complication ([place])    type II  . GERD (gastroesophageal reflux disease)   . Headache   . Hypercholesteremia   . Hypertension   . OSA treated with BiPAP    ahi-132, bipap 23/19    Family History  Problem Relation Age of Onset  . Hypertension Father   . Diabetes Father   . Diabetes Other        Multiple family members  . Diabetes Sister   . Diabetes Brother   . Stroke Sister     Past Surgical History:  Procedure Laterality Date  . ESOPHAGOGASTRODUODENOSCOPY (EGD) WITH PROPOFOL N/A 12/11/2012   Procedure: ESOPHAGOGASTRODUODENOSCOPY (EGD) WITH PROPOFOL;  Surgeon: Arta Silence, MD;   Location: [facility];  Service: Endoscopy;  Laterality: N/A;  . FINGER AMPUTATION    . KNEE ARTHROSCOPY Left 11/15/2015   Procedure: ARTHROSCOPY KNEE AND MEDIAL MENISECTOMY;  Surgeon: Paralee Cancel, MD;  Location: [facility];  Service: Orthopedics;  Laterality: Left;  . KNEE ARTHROSCOPY WITH LATERAL MENISECTOMY Right 07/14/2020   Procedure: RIGHT KNEE PARTIAL LATERAL MENISCECTOMY;  Surgeon: Leandrew Koyanagi, MD;  Location: [facility];  Service: Orthopedics;  Laterality: Right;   Social History   Occupational History  . Not on file  Tobacco Use  . Smoking status: Current Every Day Smoker    Packs/day: 0.50    Years: 40.00    Pack years: 20.00    Types: Cigarettes  . Smokeless tobacco: Never Used  . Tobacco comment: 06/05/17 1 pack per 1 1/2 days  Substance and Sexual Activity  . Alcohol use: Yes    Comment: occasionally  . Drug use: Yes    Types: Cocaine    Comment: patient states none- read lab report 09/02/2012/ 11-20-12 case cx.due to positive screen test-pt. advised no recreational drugs to be used  . Sexual activity: Not on file

## 2020-08-30 ENCOUNTER — Other Ambulatory Visit: Payer: Self-pay | Admitting: Family Medicine

## 2020-08-30 DIAGNOSIS — G4452 New daily persistent headache (NDPH): Secondary | ICD-10-CM

## 2020-09-10 ENCOUNTER — Telehealth: Payer: Self-pay | Admitting: Pharmacist

## 2020-09-10 NOTE — Progress Notes (Addendum)
Chronic Care Management Pharmacy Assistant   Name: Kenneth [name]  MRN: [id number] DOB: 01-30-1961  Reason for Encounter: Disease State For DM.   Conditions to be addressed/monitored: HTN, Asthma, GERD, Type II DM, HLD  Recent office visits:  None since 08/13/20  Recent consult visits:  08/25/20 Orthopedics Aundra Dubin, PA-C. For follow-up. No medication changes.   Hospital visits: None since 08/13/20  Medications: Outpatient Encounter Medications as of 09/10/2020  Medication Sig   AIMOVIG 70 MG/ML SOAJ INJECT 70 MG INTO THE SKIN EVERY 30 DAYS.   albuterol (VENTOLIN HFA) 108 (90 Base) MCG/ACT inhaler INHALE 2 PUFFS INTO THE LUNGS EVERY 4 HOURS AS NEEDED FOR SHORTNESS OF BREATH (Patient taking differently: Inhale 2 puffs into the lungs every 4 (four) hours as needed for wheezing or shortness of breath.)   amLODipine (NORVASC) 10 MG tablet TAKE 1 TABLET BY MOUTH DAILY. (Patient taking differently: Take 10 mg by mouth in the morning.)   aspirin 81 MG chewable tablet Chew 81 mg by mouth in the morning.   atorvastatin (LIPITOR) 40 MG tablet Take 1 tablet (40 mg total) by mouth daily.   blood glucose meter kit and supplies KIT Dispense based on patient and insurance preference. Use up to four times daily as directed.   Blood Glucose Monitoring Suppl (ACCU-CHEK AVIVA PLUS) w/Device KIT Use as directed to monitor FSBS 2x daily. Dx: E11.9   cephALEXin (KEFLEX) 500 MG capsule Take 1 capsule (500 mg total) by mouth every 12 (twelve) hours.   Glycerin-Hypromellose-PEG 400 (VISINE DRY EYE) 0.2-0.2-1 % SOLN Place 1-2 drops into both eyes 3 (three) times daily as needed (dry/irritated eyes.).   HYDROcodone-acetaminophen (NORCO) 5-325 MG tablet Take 1-2 tablets by mouth 2 (two) times daily as needed. (Patient taking differently: Take 1-2 tablets by mouth 2 (two) times daily as needed for moderate pain.)   metFORMIN (GLUCOPHAGE) 1000 MG tablet TAKE 1 TABLET BY MOUTH 2 TIMES DAILY WITH A MEAL.  (Patient taking differently: Take 1,000 mg by mouth 2 (two) times daily with a meal.)   MODERNA COVID-19 VACCINE 100 MCG/0.5ML injection    mupirocin ointment (BACTROBAN) 2 % Apply 1 application topically 2 (two) times daily. (Patient taking differently: Apply 1 application topically 2 (two) times daily as needed (skin irritation).)   neomycin-polymyxin b-dexamethasone (MAXITROL) 3.5-10000-0.1 SUSP Place 1 drop into the left eye in the morning, at noon, in the evening, and at bedtime.   nitroGLYCERIN (NITROSTAT) 0.4 MG SL tablet Place 1 tablet (0.4 mg total) under the tongue every 5 (five) minutes as needed. For chest pain (Patient taking differently: Place 0.4 mg under the tongue every 5 (five) minutes x 3 doses as needed for chest pain.)   omeprazole (PRILOSEC) 40 MG capsule TAKE 1 CAPSULE BY MOUTH DAILY. (Patient taking differently: Take 40 mg by mouth in the morning.)   ondansetron (ZOFRAN) 4 MG tablet Take 1-2 tablets (4-8 mg total) by mouth every 8 (eight) hours as needed for nausea or vomiting.   senna-docusate (SENOKOT-S) 8.6-50 MG tablet Take 1 tablet by mouth daily as needed (constipation).   topiramate (TOPAMAX) 25 MG tablet TAKE 2 TABLETS BY MOUTH 2 TIMES DAILY.   Facility-Administered Encounter Medications as of 09/10/2020  Medication   lactated ringers infusion   Recent Relevant Labs: Lab Results  Component Value Date/Time   HGBA1C 7.6 (H) 07/30/2020 02:35 AM   HGBA1C 6.3 (H) 12/18/2019 08:15 AM   HGBA1C >14.0 (H) 01/28/2016 11:42 AM   MICROALBUR 0.6 12/18/2019  08:15 AM   MICROALBUR 1.1 05/22/2017 02:33 PM    Kidney Function Lab Results  Component Value Date/Time   CREATININE 0.83 07/29/2020 11:56 AM   CREATININE 0.70 07/14/2020 01:22 PM   CREATININE 0.86 12/18/2019 08:15 AM   CREATININE 0.96 06/02/2019 12:03 PM   GFRNONAA >60 07/29/2020 11:56 AM   GFRNONAA 95 12/18/2019 08:15 AM   GFRAA 110 12/18/2019 08:15 AM    Current antihyperglycemic regimen:  Metformin 1011m  twice daily with meals  What recent interventions/DTPs have been made to improve glycemic control:  None.   Have there been any recent hospitalizations or ED visits since last visit with CPP? Patient stated no.  Patient denies hypoglycemic symptoms, including None  Patient denies hyperglycemic symptoms, including none  How often are you checking your blood sugar? Patient stated once daily  What are your blood sugars ranging?  Patient stated his blood sugar ranges around 134.  During the week, how often does your blood glucose drop below 70?  Patient stated Never  Are you checking your feet daily/regularly?  Patient stated he checks his feet regularly.   Adherence Review: Is the patient currently on a STATIN medication? Atorvastatin 40 mg  Is the patient currently on ACE/ARB medication? N/A  Does the patient have >5 day gap between last estimated fill dates? Per misc rpts, no.  Star Rating Drugs: Atorvastatin 40 mg 90 DS 08/16/20, Metformin 1000 mg 90 DS 07/27/20.  Patient stated he did not have any questions or concerns about his medications at this time. He stated he did have some knee discomfort but he was out of town and once he returned he would call and make an appointment.  Follow-Up:Pharmacist Review  Mr. CDuddy# [phone] VCharlann Lange RMA Clinical Pharmacist Assistant [phone] 10 minutes spent in review, coordination, and documentation.  Reviewed by: CBeverly Milch PharmD Clinical Pharmacist [facility] ([phone]

## 2020-09-21 DIAGNOSIS — G4733 Obstructive sleep apnea (adult) (pediatric): Secondary | ICD-10-CM | POA: Diagnosis not present

## 2020-10-04 NOTE — Progress Notes (Signed)
Referring-Warren Dennard Schaumann, MD Reason for referral-syncope  HPI: 60 year old male for evaluation of syncope at request of [name], MD.  Echocardiogram May 2022 showed normal LV function.  CTA May 2022 showed no pulmonary embolus.  Aortic atherosclerosis noted.  Patient had syncopal episode May 2022.  Urine drug screen positive for cocaine.  Troponins normal.  BNP 7.5.  Episode felt likely orthostatic mediated.  Cardiology now asked to evaluate.  Patient has some dyspnea on exertion but no orthopnea, PND, pedal edema, exertional chest pain.  On the day of his event he states he got out of his car and walked around to the other side and then had frank syncope.  He feels as though he was unconscious for 1 to 2 minutes.  No preceding palpitations, chest pain, dyspnea or nausea.  No recurrent events since that time.  Current Outpatient Medications  Medication Sig Dispense Refill   Accu-Chek Softclix Lancets lancets 2 (two) times daily.     AIMOVIG 70 MG/ML SOAJ INJECT 70 MG INTO THE SKIN EVERY 30 DAYS. 1 mL 3   albuterol (VENTOLIN HFA) 108 (90 Base) MCG/ACT inhaler INHALE 2 PUFFS INTO THE LUNGS EVERY 4 HOURS AS NEEDED FOR SHORTNESS OF BREATH (Patient taking differently: Inhale 2 puffs into the lungs every 4 (four) hours as needed for wheezing or shortness of breath.) 8.5 g 2   amLODipine (NORVASC) 10 MG tablet TAKE 1 TABLET BY MOUTH DAILY. (Patient taking differently: Take 10 mg by mouth in the morning.) 90 tablet 3   aspirin 81 MG chewable tablet Chew 81 mg by mouth in the morning.     atorvastatin (LIPITOR) 40 MG tablet Take 1 tablet (40 mg total) by mouth daily. 90 tablet 3   blood glucose meter kit and supplies KIT Dispense based on patient and insurance preference. Use up to four times daily as directed. 1 each 11   Blood Glucose Monitoring Suppl (ACCU-CHEK AVIVA PLUS) w/Device KIT Use as directed to monitor FSBS 2x daily. Dx: E11.9 1 kit 1   cephALEXin (KEFLEX) 500 MG capsule Take 1  capsule (500 mg total) by mouth every 12 (twelve) hours. 8 capsule 0   Glycerin-Hypromellose-PEG 400 (VISINE DRY EYE) 0.2-0.2-1 % SOLN Place 1-2 drops into both eyes 3 (three) times daily as needed (dry/irritated eyes.).     HYDROcodone-acetaminophen (NORCO) 5-325 MG tablet Take 1-2 tablets by mouth 2 (two) times daily as needed. (Patient taking differently: Take 1-2 tablets by mouth 2 (two) times daily as needed for moderate pain.) 20 tablet 0   metFORMIN (GLUCOPHAGE) 1000 MG tablet TAKE 1 TABLET BY MOUTH 2 TIMES DAILY WITH A MEAL. (Patient taking differently: Take 1,000 mg by mouth 2 (two) times daily with a meal.) 180 tablet 3   neomycin-polymyxin b-dexamethasone (MAXITROL) 3.5-10000-0.1 SUSP Place 1 drop into the left eye in the morning, at noon, in the evening, and at bedtime.     omeprazole (PRILOSEC) 40 MG capsule TAKE 1 CAPSULE BY MOUTH DAILY. (Patient taking differently: Take 40 mg by mouth in the morning.) 90 capsule 3   ondansetron (ZOFRAN) 4 MG tablet Take 1-2 tablets (4-8 mg total) by mouth every 8 (eight) hours as needed for nausea or vomiting. 20 tablet 0   senna-docusate (SENOKOT-S) 8.6-50 MG tablet Take 1 tablet by mouth daily as needed (constipation).     topiramate (TOPAMAX) 25 MG tablet TAKE 2 TABLETS BY MOUTH 2 TIMES DAILY. 120 tablet 5   MODERNA COVID-19 VACCINE 100 MCG/0.5ML injection  mupirocin ointment (BACTROBAN) 2 % Apply 1 application topically 2 (two) times daily. (Patient not taking: Reported on 10/08/2020) 30 g 2   nitroGLYCERIN (NITROSTAT) 0.4 MG SL tablet Place 1 tablet (0.4 mg total) under the tongue every 5 (five) minutes as needed. For chest pain (Patient taking differently: Place 0.4 mg under the tongue every 5 (five) minutes x 3 doses as needed for chest pain.) 20 tablet 1   No current facility-administered medications for this visit.   Facility-Administered Medications Ordered in Other Visits  Medication Dose Route Frequency Provider Last Rate Last Admin    lactated ringers infusion    Continuous PRN Chyrel Masson, CRNA   New Bag at 11/15/15 1545    No Known Allergies   Past Medical History:  Diagnosis Date   Arthritis    Asthma    Chronic bronchitis ([place])    Diabetes mellitus without complication ([place])    type II   GERD (gastroesophageal reflux disease)    Headache    Hypercholesteremia    Hypertension    OSA treated with BiPAP    ahi-132, bipap 23/19    Past Surgical History:  Procedure Laterality Date   ESOPHAGOGASTRODUODENOSCOPY (EGD) WITH PROPOFOL N/A 12/11/2012   Procedure: ESOPHAGOGASTRODUODENOSCOPY (EGD) WITH PROPOFOL;  Surgeon: Arta Silence, MD;  Location: [facility];  Service: Endoscopy;  Laterality: N/A;   FINGER AMPUTATION     KNEE ARTHROSCOPY Left 11/15/2015   Procedure: ARTHROSCOPY KNEE AND MEDIAL MENISECTOMY;  Surgeon: Paralee Cancel, MD;  Location: [facility];  Service: Orthopedics;  Laterality: Left;   KNEE ARTHROSCOPY WITH LATERAL MENISECTOMY Right 07/14/2020   Procedure: RIGHT KNEE PARTIAL LATERAL MENISCECTOMY;  Surgeon: Leandrew Koyanagi, MD;  Location: [facility];  Service: Orthopedics;  Laterality: Right;    Social History   Socioeconomic History   Marital status: Divorced    Spouse name: Not on file   Number of children: 0   Years of education: Not on file   Highest education level: Not on file  Occupational History   Not on file  Tobacco Use   Smoking status: Every Day    Packs/day: 0.50    Years: 40.00    Pack years: 20.00    Types: Cigarettes   Smokeless tobacco: Never   Tobacco comments:    06/05/17 1 pack per 1 1/2 days  Substance and Sexual Activity   Alcohol use: Yes    Comment: occasionally   Drug use: Yes    Types: Cocaine    Comment: patient states none- read lab report 09/02/2012/ 11-20-12 case cx.due to positive screen test-pt. advised no recreational drugs to be used   Sexual activity: Not on file  Other Topics Concern   Not on file  Social History Narrative   Lives with brother and  his sister in [place].     Social Determinants of Health   Financial Resource Strain: Low Risk    Difficulty of Paying Living Expenses: Not very hard  Food Insecurity: Not on file  Transportation Needs: Not on file  Physical Activity: Not on file  Stress: Not on file  Social Connections: Not on file  Intimate Partner Violence: Not on file    Family History  Problem Relation Age of Onset   Hypertension Father    Diabetes Father    Diabetes Other        Multiple family members   Diabetes Sister    Diabetes Brother    Stroke Sister     ROS: Sacral pain  following recent fall but no fevers or chills, productive cough, hemoptysis, dysphasia, odynophagia, melena, hematochezia, dysuria, hematuria, rash, seizure activity, orthopnea, PND, pedal edema, claudication. Remaining systems are negative.  Physical Exam:   Blood pressure (!) 110/54, pulse 85, height _0  (1.651 m), weight 281 lb 3.2 oz (127.6 kg), SpO2 97 %.  General:  Well developed/well nourished in NAD Skin warm/dry Patient not depressed No peripheral clubbing Back-normal HEENT-normal/normal eyelids Neck supple/normal carotid upstroke bilaterally; no bruits; no JVD; no thyromegaly chest - CTA/ normal expansion CV - RRR/normal S1 and S2; no murmurs, rubs or gallops;  PMI nondisplaced Abdomen -NT/ND, no HSM, no mass, + bowel sounds, no bruit 2+ femoral pulses, no bruits Ext-no edema, chords, 2+ DP Neuro-grossly nonfocal  ECG -Jul 29, 2020-sinus rhythm, right bundle branch block, left anterior fascicular block, left ventricular hypertrophy.  Personally reviewed  A/P  1 syncope-etiology unclear.  Question orthostatic mediated.  His blood pressure is borderline.  I will decrease amlodipine from 10 mg to 5 mg daily to allow his blood pressure to run higher.  Note his LV function is normal.  If he has more frequent events in the future will consider monitor.  He does have conduction abnormalities on his ECG.  2  hypertension-patient's blood pressure is borderline.  I wonder if that may have contributed to his syncope when he stood.  He also occasionally notes dizziness with standing.  I will decrease amlodipine from 10 mg to 5 mg daily.  Follow blood pressure and adjust medications as needed.  3 hyperlipidemia-continue statin.  Lipids and liver monitored by primary care.  4 cocaine abuse-patient counseled on avoiding.  5 tobacco abuse-patient counseled on discontinuing.  Kirk Ruths, MD

## 2020-10-07 DIAGNOSIS — H00021 Hordeolum internum right upper eyelid: Secondary | ICD-10-CM | POA: Diagnosis not present

## 2020-10-08 ENCOUNTER — Other Ambulatory Visit: Payer: Self-pay

## 2020-10-08 ENCOUNTER — Encounter: Payer: Self-pay | Admitting: Cardiology

## 2020-10-08 ENCOUNTER — Ambulatory Visit (INDEPENDENT_AMBULATORY_CARE_PROVIDER_SITE_OTHER): Payer: Medicare Other | Admitting: Cardiology

## 2020-10-08 VITALS — BP 110/54 | HR 85 | Ht 65.0 in | Wt 281.2 lb

## 2020-10-08 DIAGNOSIS — R55 Syncope and collapse: Secondary | ICD-10-CM

## 2020-10-08 DIAGNOSIS — E7800 Pure hypercholesterolemia, unspecified: Secondary | ICD-10-CM

## 2020-10-08 DIAGNOSIS — I10 Essential (primary) hypertension: Secondary | ICD-10-CM | POA: Diagnosis not present

## 2020-10-08 MED ORDER — AMLODIPINE BESYLATE 5 MG PO TABS: 5.0000 mg | ORAL_TABLET | Freq: Every day | ORAL | 3 refills | Status: DC

## 2020-10-08 NOTE — Patient Instructions (Signed)
Medication Instructions:   DECREASE AMLODIPINE TO 5 MG ONCE DAILY= 1/2 OF THE 10 MG TABLET ONCE DAILY  *If you need a refill on your cardiac medications before your next appointment, please call your pharmacy*   Follow-Up: At [facility], you and your health needs are our priority.  As part of our continuing mission to provide you with exceptional heart care, we have created designated Provider Care Teams.  These Care Teams include your primary Cardiologist (physician) and Advanced Practice Providers (APPs -  Physician Assistants and Nurse Practitioners) who all work together to provide you with the care you need, when you need it.  We recommend signing up for the patient portal called "MyChart".  Sign up information is provided on this After Visit Summary.  MyChart is used to connect with patients for Virtual Visits (Telemedicine).  Patients are able to view lab/test results, encounter notes, upcoming appointments, etc.  Non-urgent messages can be sent to your provider as well.   To learn more about what you can do with MyChart, go to [email or web address].    Your next appointment:   3-4 month(s)  The format for your next appointment:   In Person  Provider:   You will see one of the following Advanced Practice Providers on your designated Care Team:   [name], PA-C Coletta Memos, FNP  Then, None will plan to see you again in 6 month(s).   Other Instructions  TRACK BLOOD PRESSURE

## 2020-10-14 ENCOUNTER — Ambulatory Visit (INDEPENDENT_AMBULATORY_CARE_PROVIDER_SITE_OTHER): Payer: Medicare Other | Admitting: Pharmacist

## 2020-10-14 DIAGNOSIS — E782 Mixed hyperlipidemia: Secondary | ICD-10-CM

## 2020-10-14 DIAGNOSIS — E119 Type 2 diabetes mellitus without complications: Secondary | ICD-10-CM | POA: Diagnosis not present

## 2020-10-14 DIAGNOSIS — I10 Essential (primary) hypertension: Secondary | ICD-10-CM | POA: Diagnosis not present

## 2020-10-14 DIAGNOSIS — E1169 Type 2 diabetes mellitus with other specified complication: Secondary | ICD-10-CM

## 2020-10-14 NOTE — Patient Instructions (Addendum)
Visit Information   Goals Addressed             This Visit's Progress    Monitor and Manage My Blood Sugar-Diabetes Type 2   On track    Timeframe:  Long-Range Goal Priority:  High Start Date: 08/13/20                            Expected End Date: 02/13/21                      Follow Up Date 11/24/20   - check blood sugar at prescribed times - check blood sugar before and after exercise - enter blood sugar readings and medication or insulin into daily log - take the blood sugar log to all doctor visits    Why is this important?   Checking your blood sugar at home helps to keep it from getting very high or very low.  Writing the results in a diary or log helps the doctor know how to care for you.  Your blood sugar log should have the time, date and the results.  Also, write down the amount of insulin or other medicine that you take.  Other information, like what you ate, exercise done and how you were feeling, will also be helpful.     Notes:      Track and Manage My Blood Pressure-Hypertension       Timeframe:  Long-Range Goal Priority:  High Start Date:  10/14/20                           Expected End Date: 04/25/21                      Follow Up Date 01/24/21    - check blood pressure 3 times per week - choose a place to take my blood pressure (home, clinic or office, retail store) - write blood pressure results in a log or diary    Why is this important?   You won't feel high blood pressure, but it can still hurt your blood vessels.  High blood pressure can cause heart or kidney problems. It can also cause a stroke.  Making lifestyle changes like losing a little weight or eating less salt will help.  Checking your blood pressure at home and at different times of the day can help to control blood pressure.  If the doctor prescribes medicine remember to take it the way the doctor ordered.  Call the office if you cannot afford the medicine or if there are questions  about it.     Notes:        Patient Care Plan: General Pharmacy (Adult)     Problem Identified: HTN, Asthma, GERD, Type II DM, HLD   Priority: High  Onset Date: 08/13/2020     Long-Range Goal: Patient-Specific Goal   Start Date: 08/13/2020  Expected End Date: 02/13/2021  This Visit's Progress: On track  Recent Progress: On track  Priority: High  Note:   Current Barriers:  Unable to achieve control of glucose  Suboptimal therapeutic regimen for cholesterol  Pharmacist Clinical Goal(s):  Patient will achieve control of glucose and LDL as evidenced by monitoring adhere to plan to optimize therapeutic regimen for cholesterol as evidenced by report of adherence to recommended medication management changes contact provider office for questions/concerns as evidenced notation of  same in electronic health record through collaboration with PharmD and provider.   Interventions: 1:1 collaboration with [name], MD regarding development and update of comprehensive plan of care as evidenced by provider attestation and co-signature Inter-disciplinary care team collaboration (see longitudinal plan of care) Comprehensive medication review performed; medication list updated in electronic medical record  Hypertension (BP goal <130/80) -Controlled -Current treatment: Amlodipine '5mg'$  daily -Medications previously tried: none noted   -Current home readings: 113/67-133/73 -Current dietary habits: patient has stopped drinking alcohol and is working on cutting back on breads and other carbs -Current exercise habits: does something active daily -Denies hypotensive/hypertensive symptoms -Educated on BP goals and benefits of medications for prevention of heart attack, stroke and kidney damage; Exercise goal of 150 minutes per week; Importance of home blood pressure monitoring; Symptoms of hypotension and importance of maintaining adequate hydration; -Counseled to monitor BP at home daily,  document, and provide log at future appointments -Recommended to continue current medication   Update 10/14/20 Amlodipine decreased to '5mg'$  by cardiology Recent home readings: He has not been checking Denies any recent dizziness and feelings of passing out.  Does report a recent fall where he slipped down rainy steps, attributes this to just slipping. Have asked him to monitor blood pressure at least once or twice weekly moving forward.  Continue current meds for now.  Hyperlipidemia: (LDL goal < 70) -Uncontrolled -Current treatment: Atorvastatin '40mg'$  daily -Medications previously tried: none noted  -Current dietary patterns: see above -Current exercise habits: see above -Educated on Cholesterol goals;  Benefits of statin for ASCVD risk reduction; Importance of limiting foods high in cholesterol; Exercise goal of 150 minutes per week;  -Reviewed most recent lipid panel, LDL elevated at 134 he tolerates Atorvastatin '10mg'$  find with no concerns.  ASCVD risk is 32% making him a candidate for a high intensity statin. -Collaborated with PCP, would recommend increasing to Atorvastatin '40mg'$  daily to get to goal LDL.   Update 10/14/20 Taking new dose of Lipitor with no concerns. Encouraged continued adherence Recheck lipids in about another 30 days or next return for labs after that.  Diabetes (A1c goal <7%) -Not ideally controlled -Current medications: Metformin '1000mg'$  twice daily with meals -Medications previously tried: none noted  -Current home glucose readings fasting glucose: 108-165 post prandial glucose: 102-187 -Denies hypoglycemic/hyperglycemic symptoms -Current meal patterns: see above drinks: water, coke zero -Current exercise: see above -Educated on A1c and blood sugar goals; Complications of diabetes including kidney damage, retinal damage, and cardiovascular disease; Prevention and management of hypoglycemic episodes; Benefits of routine self-monitoring of blood  sugar; -Counseled to check feet daily and get yearly eye exams -Recommended to continue current medication  -Continue to work on lifestyle changes, recommend repeat A1c around 3 months  Update 10/14/20 Recent fasting sugars: 130s - has not been monitoring like normal Have asked him to monitor and record at least fasting if possible. He is really concerned about his tail bone pain at this time and trying to get that straight. Will have DM call in 30 days to assess.  Continue current meds  Asthma (Goal: control symptoms and prevent exacerbations) -Not ideally controlled -Current treatment  Albuterol HFA 78mg prn -Medications previously tried: none noted   -Exacerbations requiring treatment in last 6 months: none -Patient denies consistent use of maintenance inhaler -Frequency of rescue inhaler use: as needed, usually during strenuous activity -Counseled on Proper inhaler technique; Benefits of consistent maintenance inhaler use  -Reports sometimes he will just be sitting and breathing  heavy -Recommended to continue current medication Could consider controller medication such as Dulera for maintenance.  GERD (Goal: Minimize symptoms) -Controlled -Current treatment  Omeprazole '40mg'$  daily -Medications previously tried: none noted -Denies any symptoms and takes appropriately  -Recommended to continue current medication   Patient Goals/Self-Care Activities Patient will:  - take medications as prescribed focus on medication adherence by pill box check glucose daily, document, and provide at future appointments check blood pressure daily, document, and provide at future appointments target a minimum of 150 minutes of moderate intensity exercise weekly  Follow Up Plan: The care management team will reach out to the patient again over the next 90 days.         Patient verbalizes understanding of instructions provided today and agrees to view in [place].  Telephone follow up  appointment with pharmacy team member scheduled for: 3 months  Edythe Clarity, [place]

## 2020-10-14 NOTE — Progress Notes (Signed)
Chronic Care Management Pharmacy Note  10/14/2020 Name:  Kenneth [name] MRN:  [id number] DOB:  [birth year]  Subjective: Kenneth [name] is an 60 y.o. year old male who is a primary patient of Pickard, Cammie Mcgee, MD.  The CCM team was consulted for assistance with disease management and care coordination needs.    Engaged with patient by telephone for follow up visit in response to provider referral for pharmacy case management and/or care coordination services.   Consent to Services:  The patient was given the following information about Chronic Care Management services today, agreed to services, and gave verbal consent: 1. CCM service includes personalized support from designated clinical staff supervised by the primary care provider, including individualized plan of care and coordination with other care providers 2. 24/7 contact phone numbers for assistance for urgent and routine care needs. 3. Service will only be billed when office clinical staff spend 20 minutes or more in a month to coordinate care. 4. Only one practitioner may furnish and bill the service in a calendar month. 5.The patient may stop CCM services at any time (effective at the end of the month) by phone call to the office staff. 6. The patient will be responsible for cost sharing (co-pay) of up to 20% of the service fee (after annual deductible is met). Patient agreed to services and consent obtained.  Patient Care Team: [name], MD as PCP - General (Family Medicine) Edythe Clarity, [facility] as Pharmacist (Pharmacist)  Recent office visits: None since most recent CCM call  Recent consult visits: 10/08/20 [facility]) - For Syncope episodes, amlodipine decreased to 44m daily     Hospital visits:  None since most recent CCM call   Objective:  Lab Results  Component Value Date   CREATININE 0.83 07/29/2020   BUN 17 07/29/2020   GFRNONAA >60 07/29/2020   GFRAA 110 12/18/2019   NA 136 07/29/2020   K 3.9  07/29/2020   CALCIUM 9.4 07/29/2020   CO2 23 07/29/2020   GLUCOSE 122 (H) 07/29/2020    Lab Results  Component Value Date/Time   HGBA1C 7.6 (H) 07/30/2020 02:35 AM   HGBA1C 6.3 (H) 12/18/2019 08:15 AM   HGBA1C >14.0 (H) 01/28/2016 11:42 AM   MICROALBUR 0.6 12/18/2019 08:15 AM   MICROALBUR 1.1 05/22/2017 02:33 PM    Last diabetic Eye exam:  Lab Results  Component Value Date/Time   HMDIABEYEEXA No Retinopathy 03/29/2020 11:24 AM    Last diabetic Foot exam: No results found for: HMDIABFOOTEX   Lab Results  Component Value Date   CHOL 200 07/30/2020   HDL 48 07/30/2020   LDLCALC 134 (H) 07/30/2020   TRIG 92 07/30/2020   CHOLHDL 4.2 07/30/2020    Hepatic Function Latest Ref Rng & Units 02/17/2020 12/18/2019 06/02/2019  Total Protein 6.5 - 8.1 g/dL 8.0 8.0 7.0  Albumin 3.5 - 5.0 g/dL 3.8 - -  AST 15 - 41 U/L _0 ALT 0 - 44 U/L _1 Alk Phosphatase 38 - 126 U/L 65 - -  Total Bilirubin 0.3 - 1.2 mg/dL 0.2(L) 0.4 0.2    No results found for: TSH, FREET4  CBC Latest Ref Rng & Units 07/29/2020 07/14/2020 07/14/2020  WBC 4.0 - 10.5 K/uL 8.6 - 9.3  Hemoglobin 13.0 - 17.0 g/dL 14.2 16.3 16.0  Hematocrit 39.0 - 52.0 % 45.0 48.0 49.9  Platelets 150 - 400 K/uL 311 - 349    Lab Results  Component Value Date/Time  VD25OH 11 (L) 05/12/2009 12:33 AM    Clinical ASCVD: No  The 10-year ASCVD risk score Mikey Bussing DC Jr., et al., 2013) is: 30.2%   Values used to calculate the score:     Age: 19 years     Sex: Male     Is Non-Hispanic African American: Yes     Diabetic: Yes     Tobacco smoker: Yes     Systolic Blood Pressure: 161 mmHg     Is BP treated: Yes     HDL Cholesterol: 48 mg/dL     Total Cholesterol: 200 mg/dL    Depression screen [facility] 2/9 11/03/2019 05/22/2017 11/14/2016  Decreased Interest 0 0 0  Down, Depressed, Hopeless 0 0 0  PHQ - 2 Score 0 0 0  Some recent data might be hidden      Social History   Tobacco Use  Smoking Status Every Day   Packs/day: 0.50    Years: 40.00   Pack years: 20.00   Types: Cigarettes  Smokeless Tobacco Never  Tobacco Comments   06/05/17 1 pack per 1 1/2 days   BP Readings from Last 3 Encounters:  10/08/20 (!) 110/54  08/02/20 114/72  07/30/20 130/75   Pulse Readings from Last 3 Encounters:  10/08/20 85  08/02/20 98  07/30/20 76   Wt Readings from Last 3 Encounters:  10/08/20 281 lb 3.2 oz (127.6 kg)  08/25/20 279 lb (126.6 kg)  08/02/20 280 lb (127 kg)   BMI Readings from Last 3 Encounters:  10/08/20 46.79 kg/m  08/25/20 42.42 kg/m  08/02/20 45.19 kg/m    Assessment/Interventions: Review of patient past medical history, allergies, medications, health status, including review of consultants reports, laboratory and other test data, was performed as part of comprehensive evaluation and provision of chronic care management services.   SDOH:  (Social Determinants of Health) assessments and interventions performed: Yes   Financial Resource Strain: Low Risk    Difficulty of Paying Living Expenses: Not very hard    Emergency planning/management officer Strain: Low Risk    Difficulty of Paying Living Expenses: Not very hard    SDOH Screenings   Alcohol Screen: Not on file  Depression (PHQ2-9): Low Risk    PHQ-2 Score: 0  Financial Resource Strain: Low Risk    Difficulty of Paying Living Expenses: Not very hard  Food Insecurity: Not on file  Housing: Not on file  Physical Activity: Not on file  Social Connections: Not on file  Stress: Not on file  Tobacco Use: High Risk   Smoking Tobacco Use: Every Day   Smokeless Tobacco Use: Never  Transportation Needs: Not on file    [place]  No Known Allergies  Medications Reviewed Today     Reviewed by Edythe Clarity, [facility] (Pharmacist) on 10/14/20 at [phone]  Med List Status: <None>   Medication Order Taking? Sig Documenting Provider Last Dose Status Informant  Accu-Chek Softclix Lancets lancets [id number] Yes 2 (two) times daily. [provider]  Taking Active   AIMOVIG 70 MG/ML SOAJ [id number] Yes INJECT 70 MG INTO THE SKIN EVERY 30 DAYS. [name], MD Taking Active Self  albuterol (VENTOLIN HFA) 108 (90 Base) MCG/ACT inhaler [id number] Yes INHALE 2 PUFFS INTO THE LUNGS EVERY 4 HOURS AS NEEDED FOR SHORTNESS OF BREATH  Patient taking differently: Inhale 2 puffs into the lungs every 4 (four) hours as needed for wheezing or shortness of breath.   [name], MD Taking Active   amLODipine [facility])  5 MG tablet [id number] Yes Take 1 tablet (5 mg total) by mouth daily. Lelon Perla, MD Taking Active   aspirin 81 MG chewable tablet [id number] Yes Chew 81 mg by mouth in the morning. [provider] Taking Active Self  atorvastatin (LIPITOR) 40 MG tablet [id number] Yes Take 1 tablet (40 mg total) by mouth daily. [name], MD Taking Active   blood glucose meter kit and supplies KIT [id number] Yes Dispense based on patient and insurance preference. Use up to four times daily as directed. [name], MD Taking Active   Blood Glucose Monitoring Suppl (ACCU-CHEK AVIVA PLUS) w/Device KIT [id number] Yes Use as directed to monitor FSBS 2x daily. Dx: E11.9 [name], MD Taking Active Self  cephALEXin (KEFLEX) 500 MG capsule [id number] Yes Take 1 capsule (500 mg total) by mouth every 12 (twelve) hours. Geradine Girt, DO Taking Active   Glycerin-Hypromellose-PEG 400 (VISINE DRY EYE) 0.2-0.2-1 % SOLN [id number] Yes Place 1-2 drops into both eyes 3 (three) times daily as needed (dry/irritated eyes.). [provider] Taking Active Self  HYDROcodone-acetaminophen (NORCO) 5-325 MG tablet [id number] Yes Take 1-2 tablets by mouth 2 (two) times daily as needed.  Patient taking differently: Take 1-2 tablets by mouth 2 (two) times daily as needed for moderate pain.   Leandrew Koyanagi, MD Taking Active Self  metFORMIN (GLUCOPHAGE) 1000 MG tablet [id number] Yes TAKE 1 TABLET BY MOUTH 2 TIMES DAILY WITH A MEAL.  Patient  taking differently: Take 1,000 mg by mouth 2 (two) times daily with a meal.   [name], MD Taking Active Self  MODERNA COVID-19 VACCINE 100 MCG/0.5ML injection [id number] Yes  [provider] Taking Active   mupirocin ointment (BACTROBAN) 2 % [id number] Yes Apply 1 application topically 2 (two) times daily. Trula Slade, DPM Taking Active   neomycin-polymyxin b-dexamethasone (MAXITROL) 3.5-10000-0.1 SUSP [id number] Yes Place 1 drop into the left eye in the morning, at noon, in the evening, and at bedtime. [provider] Taking Active Self  nitroGLYCERIN (NITROSTAT) 0.4 MG SL tablet [id number] Yes Place 1 tablet (0.4 mg total) under the tongue every 5 (five) minutes as needed. For chest pain  Patient taking differently: Place 0.4 mg under the tongue every 5 (five) minutes x 3 doses as needed for chest pain.   Alycia Rossetti, MD Taking Active            Med Note Orma Render   Fri Oct 08, 2020 11:14 AM) Patient has on hand if needed  omeprazole (PRILOSEC) 40 MG capsule [id number] Yes TAKE 1 CAPSULE BY MOUTH DAILY.  Patient taking differently: Take 40 mg by mouth in the morning.   [name], MD Taking Active   ondansetron [facility]) 4 MG tablet [id number] Yes Take 1-2 tablets (4-8 mg total) by mouth every 8 (eight) hours as needed for nausea or vomiting. Leandrew Koyanagi, MD Taking Active Self  senna-docusate (SENOKOT-S) 8.6-50 MG tablet [id number] Yes Take 1 tablet by mouth daily as needed (constipation). [provider] Taking Active Self  topiramate (TOPAMAX) 25 MG tablet [id number] Yes TAKE 2 TABLETS BY MOUTH 2 TIMES DAILY. [name], MD Taking Active   Med List Note Sharene Butters, CPhT 07/09/20 1004): Cpap at bedtime            Patient Active Problem List   Diagnosis Date Noted   Mixed diabetic hyperlipidemia associated with type 2 diabetes mellitus ([place]) 07/29/2020   Mild intermittent asthma  without complication 86/75/4492    Essential hypertension 07/29/2020   GERD without esophagitis 07/29/2020   Acute cystitis without hematuria 07/29/2020   Bilateral pulmonary infiltrates on CXR 07/29/2020   Acute lateral meniscus tear of right knee 07/07/2020   Type 2 diabetes mellitus without complication, without long-term current use of insulin ([place]) 02/25/2019   OSA treated with BiPAP    Syncope 05/13/2014   Asthma    Knee pain    Chest pain 10/23/2011   Dysphagia 10/23/2011   Obesity 10/23/2011    Immunization History  Administered Date(s) Administered   Influenza,inj,Quad PF,6+ Mos 12/25/2018, 01/26/2020   Moderna Sars-Covid-2 Vaccination 05/25/2019, 06/28/2019   Pneumococcal Polysaccharide-23 08/25/2016   Tdap 08/25/2016    Conditions to be addressed/monitored:  HTN, Asthma, GERD, Type II DM, HLD  Care Plan : General Pharmacy (Adult)  Updates made by Edythe Clarity, RPH since 10/14/2020 12:00 AM     Problem: HTN, Asthma, GERD, Type II DM, HLD   Priority: High  Onset Date: 08/13/2020     Long-Range Goal: Patient-Specific Goal   Start Date: 08/13/2020  Expected End Date: 02/13/2021  This Visit's Progress: On track  Recent Progress: On track  Priority: High  Note:   Current Barriers:  Unable to achieve control of glucose  Suboptimal therapeutic regimen for cholesterol  Pharmacist Clinical Goal(s):  Patient will achieve control of glucose and LDL as evidenced by monitoring adhere to plan to optimize therapeutic regimen for cholesterol as evidenced by report of adherence to recommended medication management changes contact provider office for questions/concerns as evidenced notation of same in electronic health record through collaboration with PharmD and provider.   Interventions: 1:1 collaboration with [name], MD regarding development and update of comprehensive plan of care as evidenced by provider attestation and co-signature Inter-disciplinary care team collaboration (see  longitudinal plan of care) Comprehensive medication review performed; medication list updated in electronic medical record  Hypertension (BP goal <130/80) -Controlled -Current treatment: Amlodipine 50m daily -Medications previously tried: none noted   -Current home readings: 113/67-133/73 -Current dietary habits: patient has stopped drinking alcohol and is working on cutting back on breads and other carbs -Current exercise habits: does something active daily -Denies hypotensive/hypertensive symptoms -Educated on BP goals and benefits of medications for prevention of heart attack, stroke and kidney damage; Exercise goal of 150 minutes per week; Importance of home blood pressure monitoring; Symptoms of hypotension and importance of maintaining adequate hydration; -Counseled to monitor BP at home daily, document, and provide log at future appointments -Recommended to continue current medication   Update 10/14/20 Amlodipine decreased to 572mby cardiology Recent home readings: He has not been checking Denies any recent dizziness and feelings of passing out.  Does report a recent fall where he slipped down rainy steps, attributes this to just slipping. Have asked him to monitor blood pressure at least once or twice weekly moving forward.  Continue current meds for now.  Hyperlipidemia: (LDL goal < 70) -Uncontrolled -Current treatment: Atorvastatin 4028maily -Medications previously tried: none noted  -Current dietary patterns: see above -Current exercise habits: see above -Educated on Cholesterol goals;  Benefits of statin for ASCVD risk reduction; Importance of limiting foods high in cholesterol; Exercise goal of 150 minutes per week;  -Reviewed most recent lipid panel, LDL elevated at 134 he tolerates Atorvastatin 32m44mnd with no concerns.  ASCVD risk is 32% making him a candidate for a high intensity statin. -Collaborated with PCP, would recommend increasing to Atorvastatin  40mg64m  daily to get to goal LDL.   Update 10/14/20 Taking new dose of Lipitor with no concerns. Encouraged continued adherence Recheck lipids in about another 30 days or next return for labs after that.  Diabetes (A1c goal <7%) -Not ideally controlled -Current medications: Metformin 1073m twice daily with meals -Medications previously tried: none noted  -Current home glucose readings fasting glucose: 108-165 post prandial glucose: 102-187 -Denies hypoglycemic/hyperglycemic symptoms -Current meal patterns: see above drinks: water, coke zero -Current exercise: see above -Educated on A1c and blood sugar goals; Complications of diabetes including kidney damage, retinal damage, and cardiovascular disease; Prevention and management of hypoglycemic episodes; Benefits of routine self-monitoring of blood sugar; -Counseled to check feet daily and get yearly eye exams -Recommended to continue current medication  -Continue to work on lifestyle changes, recommend repeat A1c around 3 months  Update 10/14/20 Recent fasting sugars: 130s - has not been monitoring like normal Have asked him to monitor and record at least fasting if possible. He is really concerned about his tail bone pain at this time and trying to get that straight. Will have DM call in 30 days to assess.  Continue current meds  Asthma (Goal: control symptoms and prevent exacerbations) -Not ideally controlled -Current treatment  Albuterol HFA 959m prn -Medications previously tried: none noted   -Exacerbations requiring treatment in last 6 months: none -Patient denies consistent use of maintenance inhaler -Frequency of rescue inhaler use: as needed, usually during strenuous activity -Counseled on Proper inhaler technique; Benefits of consistent maintenance inhaler use  -Reports sometimes he will just be sitting and breathing heavy -Recommended to continue current medication Could consider controller medication such as  Dulera for maintenance.  GERD (Goal: Minimize symptoms) -Controlled -Current treatment  Omeprazole 4065maily -Medications previously tried: none noted -Denies any symptoms and takes appropriately  -Recommended to continue current medication   Patient Goals/Self-Care Activities Patient will:  - take medications as prescribed focus on medication adherence by pill box check glucose daily, document, and provide at future appointments check blood pressure daily, document, and provide at future appointments target a minimum of 150 minutes of moderate intensity exercise weekly  Follow Up Plan: The care management team will reach out to the patient again over the next 90 days.          Medication Assistance: None required.  Patient affirms current coverage meets needs.  Patient's preferred pharmacy is:  [facility] [address]one: [phone]: [phone] [address] [id number]: [phone]: [phone] pill box? Yes Pt endorses 100% compliance  We discussed: Benefits of medication synchronization, packaging and delivery as well as enhanced pharmacist oversight with [employer]. Patient decided to: Continue current medication management strategy  Care Plan and Follow Up Patient Decision:  Patient agrees to Care Plan and Follow-up.  Plan: The care management team will reach out to the patient again over the next 90 days.  ChrBeverly MilchharmD Clinical Pharmacist [phone]

## 2020-10-15 ENCOUNTER — Ambulatory Visit (INDEPENDENT_AMBULATORY_CARE_PROVIDER_SITE_OTHER): Payer: Medicare Other | Admitting: Family Medicine

## 2020-10-15 ENCOUNTER — Other Ambulatory Visit: Payer: Self-pay

## 2020-10-15 ENCOUNTER — Telehealth: Payer: Self-pay

## 2020-10-15 ENCOUNTER — Encounter: Payer: Self-pay | Admitting: Family Medicine

## 2020-10-15 VITALS — BP 126/84 | HR 74 | Temp 97.9°F | Resp 18 | Ht 65.0 in | Wt 281.0 lb

## 2020-10-15 DIAGNOSIS — M533 Sacrococcygeal disorders, not elsewhere classified: Secondary | ICD-10-CM | POA: Diagnosis not present

## 2020-10-15 MED ORDER — EMPAGLIFLOZIN 25 MG PO TABS: 25.0000 mg | ORAL_TABLET | Freq: Every day | ORAL | 5 refills | Status: DC

## 2020-10-15 MED ORDER — HYDROCODONE-ACETAMINOPHEN 5-325 MG PO TABS: 1.0000 | ORAL_TABLET | Freq: Two times a day (BID) | ORAL | 0 refills | Status: AC | PRN

## 2020-10-15 NOTE — Progress Notes (Signed)
Subjective:    Patient ID: Kenneth [name], male    DOB: 10/11/1960, 60 y.o.   MRN: [id number]  HPI On 17 July, the patient slipped while walking down a flight of steps.  He landed directly on his tailbone.  Ever since that time he has had significant pain around his coccyx.  He denies any numbness or tingling radiating down his right or his left leg.  He denies any weakness in his leg.  He denies any pain shooting up his back.  He denies any pain when he bends over however he has severe pain when he tries to sit down.  It is uncomfortable all throughout the day.  At night when he lays down, the pressure causes pain on his sacrum keeping him awake at night.  He also has some pain around his tailbone whenever he tries to walk. Past Medical History:  Diagnosis Date   Arthritis    Asthma    Chronic bronchitis ([place])    Diabetes mellitus without complication ([place])    type II   GERD (gastroesophageal reflux disease)    Headache    Hypercholesteremia    Hypertension    OSA treated with BiPAP    ahi-132, bipap 23/19   Past Surgical History:  Procedure Laterality Date   ESOPHAGOGASTRODUODENOSCOPY (EGD) WITH PROPOFOL N/A 12/11/2012   Procedure: ESOPHAGOGASTRODUODENOSCOPY (EGD) WITH PROPOFOL;  Surgeon: Arta Silence, MD;  Location: [facility];  Service: Endoscopy;  Laterality: N/A;   FINGER AMPUTATION     KNEE ARTHROSCOPY Left 11/15/2015   Procedure: ARTHROSCOPY KNEE AND MEDIAL MENISECTOMY;  Surgeon: Paralee Cancel, MD;  Location: [facility];  Service: Orthopedics;  Laterality: Left;   KNEE ARTHROSCOPY WITH LATERAL MENISECTOMY Right 07/14/2020   Procedure: RIGHT KNEE PARTIAL LATERAL MENISCECTOMY;  Surgeon: Leandrew Koyanagi, MD;  Location: [facility];  Service: Orthopedics;  Laterality: Right;   Current Outpatient Medications on File Prior to Visit  Medication Sig Dispense Refill   Accu-Chek Softclix Lancets lancets 2 (two) times daily.     AIMOVIG 70 MG/ML SOAJ INJECT 70 MG INTO THE SKIN EVERY 30 DAYS. 1 mL 3    albuterol (VENTOLIN HFA) 108 (90 Base) MCG/ACT inhaler INHALE 2 PUFFS INTO THE LUNGS EVERY 4 HOURS AS NEEDED FOR SHORTNESS OF BREATH (Patient taking differently: Inhale 2 puffs into the lungs every 4 (four) hours as needed for wheezing or shortness of breath.) 8.5 g 2   amLODipine (NORVASC) 5 MG tablet Take 1 tablet (5 mg total) by mouth daily. 90 tablet 3   aspirin 81 MG chewable tablet Chew 81 mg by mouth in the morning.     atorvastatin (LIPITOR) 40 MG tablet Take 1 tablet (40 mg total) by mouth daily. 90 tablet 3   blood glucose meter kit and supplies KIT Dispense based on patient and insurance preference. Use up to four times daily as directed. 1 each 11   Blood Glucose Monitoring Suppl (ACCU-CHEK AVIVA PLUS) w/Device KIT Use as directed to monitor FSBS 2x daily. Dx: E11.9 1 kit 1   cephALEXin (KEFLEX) 500 MG capsule Take 1 capsule (500 mg total) by mouth every 12 (twelve) hours. 8 capsule 0   Glycerin-Hypromellose-PEG 400 (VISINE DRY EYE) 0.2-0.2-1 % SOLN Place 1-2 drops into both eyes 3 (three) times daily as needed (dry/irritated eyes.).     HYDROcodone-acetaminophen (NORCO) 5-325 MG tablet Take 1-2 tablets by mouth 2 (two) times daily as needed. (Patient taking differently: Take 1-2 tablets by mouth 2 (two) times daily as  needed for moderate pain.) 20 tablet 0   metFORMIN (GLUCOPHAGE) 1000 MG tablet TAKE 1 TABLET BY MOUTH 2 TIMES DAILY WITH A MEAL. (Patient taking differently: Take 1,000 mg by mouth 2 (two) times daily with a meal.) 180 tablet 3   MODERNA COVID-19 VACCINE 100 MCG/0.5ML injection      mupirocin ointment (BACTROBAN) 2 % Apply 1 application topically 2 (two) times daily. 30 g 2   neomycin-polymyxin b-dexamethasone (MAXITROL) 3.5-10000-0.1 SUSP Place 1 drop into the left eye in the morning, at noon, in the evening, and at bedtime.     nitroGLYCERIN (NITROSTAT) 0.4 MG SL tablet Place 1 tablet (0.4 mg total) under the tongue every 5 (five) minutes as needed. For chest pain  (Patient taking differently: Place 0.4 mg under the tongue every 5 (five) minutes x 3 doses as needed for chest pain.) 20 tablet 1   omeprazole (PRILOSEC) 40 MG capsule TAKE 1 CAPSULE BY MOUTH DAILY. (Patient taking differently: Take 40 mg by mouth in the morning.) 90 capsule 3   ondansetron (ZOFRAN) 4 MG tablet Take 1-2 tablets (4-8 mg total) by mouth every 8 (eight) hours as needed for nausea or vomiting. 20 tablet 0   senna-docusate (SENOKOT-S) 8.6-50 MG tablet Take 1 tablet by mouth daily as needed (constipation).     topiramate (TOPAMAX) 25 MG tablet TAKE 2 TABLETS BY MOUTH 2 TIMES DAILY. 120 tablet 5   Current Facility-Administered Medications on File Prior to Visit  Medication Dose Route Frequency Provider Last Rate Last Admin   lactated ringers infusion    Continuous PRN Chyrel Masson, CRNA   New Bag at 11/15/15 1545   No Known Allergies Social History   Socioeconomic History   Marital status: Divorced    Spouse name: Not on file   Number of children: 0   Years of education: Not on file   Highest education level: Not on file  Occupational History   Not on file  Tobacco Use   Smoking status: Every Day    Packs/day: 0.50    Years: 40.00    Pack years: 20.00    Types: Cigarettes   Smokeless tobacco: Never   Tobacco comments:    06/05/17 1 pack per 1 1/2 days  Substance and Sexual Activity   Alcohol use: Yes    Comment: occasionally   Drug use: Yes    Types: Cocaine    Comment: patient states none- read lab report 09/02/2012/ 11-20-12 case cx.due to positive screen test-pt. advised no recreational drugs to be used   Sexual activity: Not on file  Other Topics Concern   Not on file  Social History Narrative   Lives with brother and his sister in [place].     Social Determinants of Health   Financial Resource Strain: Low Risk    Difficulty of Paying Living Expenses: Not very hard  Food Insecurity: Not on file  Transportation Needs: Not on file  Physical Activity: Not  on file  Stress: Not on file  Social Connections: Not on file  Intimate Partner Violence: Not on file     Review of Systems  All other systems reviewed and are negative.     Objective:   Physical Exam Vitals reviewed.  Constitutional:      General: He is not in acute distress.    Appearance: He is obese. He is not ill-appearing, toxic-appearing or diaphoretic.  Cardiovascular:     Rate and Rhythm: Normal rate and regular rhythm.  Heart sounds: Normal heart sounds.  Pulmonary:     Effort: Pulmonary effort is normal. No respiratory distress.     Breath sounds: Normal breath sounds. No wheezing, rhonchi or rales.  Musculoskeletal:     Lumbar back: Tenderness and bony tenderness present. No swelling, edema, deformity, signs of trauma, lacerations or spasms.       Back:     Right lower leg: No edema.     Left lower leg: No edema.  Neurological:     Mental Status: He is alert.          Assessment & Plan:  Coccydynia - Plan: DG Sacrum/Coccyx Begin by obtaining an x-ray of the sacrum and coccyx.  Anticipate gradual self-limited resolution.  Patient's been taking Tylenol and ibuprofen with minimal relief.  I will give him 20, 5/325 hydrocodone/acetaminophen tablets that he can take every 8 hours as needed for severe pain.  Anticipate gradual improvement over the next 2 to 3 weeks.  I reviewed his A1c from May.  It was elevated at 7.6.  This was despite taking metformin 1000 mg twice a day.  I recommended adding Jardiance 25 mg a day and rechecking fasting lab work in 3 months.

## 2020-10-18 ENCOUNTER — Ambulatory Visit
Admission: RE | Admit: 2020-10-18 | Discharge: 2020-10-18 | Disposition: A | Discharge status: Home / Self Care | Payer: Medicare Other | Source: Ambulatory Visit | Attending: Family Medicine | Admitting: Family Medicine

## 2020-10-18 DIAGNOSIS — M533 Sacrococcygeal disorders, not elsewhere classified: Secondary | ICD-10-CM

## 2020-11-15 ENCOUNTER — Telehealth: Payer: Self-pay | Admitting: Pharmacist

## 2020-11-15 NOTE — Progress Notes (Addendum)
Chronic Care Management Pharmacy Assistant   Name: Kenneth [name]  MRN: [id number] DOB: 05/25/1960  Reason for Encounter: Disease State DM.   Conditions to be addressed/monitored: HTN, Asthma, GERD, Type II DM, HLD  Recent office visits:  10/15/20 Dr. Dennard Schaumann For pain. STARTED Empaglifozin 25 mg daily.   Recent consult visits:  None since 10/05/20  Hospital visits:  None since 10/05/20  Medications: Outpatient Encounter Medications as of 11/15/2020  Medication Sig Note   Accu-Chek Softclix Lancets lancets 2 (two) times daily.    AIMOVIG 70 MG/ML SOAJ INJECT 70 MG INTO THE SKIN EVERY 30 DAYS.    albuterol (VENTOLIN HFA) 108 (90 Base) MCG/ACT inhaler INHALE 2 PUFFS INTO THE LUNGS EVERY 4 HOURS AS NEEDED FOR SHORTNESS OF BREATH (Patient taking differently: Inhale 2 puffs into the lungs every 4 (four) hours as needed for wheezing or shortness of breath.)    amLODipine (NORVASC) 5 MG tablet Take 1 tablet (5 mg total) by mouth daily.    aspirin 81 MG chewable tablet Chew 81 mg by mouth in the morning.    atorvastatin (LIPITOR) 40 MG tablet Take 1 tablet (40 mg total) by mouth daily.    blood glucose meter kit and supplies KIT Dispense based on patient and insurance preference. Use up to four times daily as directed.    Blood Glucose Monitoring Suppl (ACCU-CHEK AVIVA PLUS) w/Device KIT Use as directed to monitor FSBS 2x daily. Dx: E11.9    cephALEXin (KEFLEX) 500 MG capsule Take 1 capsule (500 mg total) by mouth every 12 (twelve) hours.    empagliflozin (JARDIANCE) 25 MG TABS tablet Take 1 tablet (25 mg total) by mouth daily before breakfast.    Glycerin-Hypromellose-PEG 400 (VISINE DRY EYE) 0.2-0.2-1 % SOLN Place 1-2 drops into both eyes 3 (three) times daily as needed (dry/irritated eyes.).    HYDROcodone-acetaminophen (NORCO) 5-325 MG tablet Take 1-2 tablets by mouth 2 (two) times daily as needed.    metFORMIN (GLUCOPHAGE) 1000 MG tablet TAKE 1 TABLET BY MOUTH 2 TIMES DAILY WITH A MEAL.  (Patient taking differently: Take 1,000 mg by mouth 2 (two) times daily with a meal.)    MODERNA COVID-19 VACCINE 100 MCG/0.5ML injection     mupirocin ointment (BACTROBAN) 2 % Apply 1 application topically 2 (two) times daily.    neomycin-polymyxin b-dexamethasone (MAXITROL) 3.5-10000-0.1 SUSP Place 1 drop into the left eye in the morning, at noon, in the evening, and at bedtime.    nitroGLYCERIN (NITROSTAT) 0.4 MG SL tablet Place 1 tablet (0.4 mg total) under the tongue every 5 (five) minutes as needed. For chest pain (Patient taking differently: Place 0.4 mg under the tongue every 5 (five) minutes x 3 doses as needed for chest pain.) 10/08/2020: Patient has on hand if needed   omeprazole (PRILOSEC) 40 MG capsule TAKE 1 CAPSULE BY MOUTH DAILY. (Patient taking differently: Take 40 mg by mouth in the morning.)    ondansetron (ZOFRAN) 4 MG tablet Take 1-2 tablets (4-8 mg total) by mouth every 8 (eight) hours as needed for nausea or vomiting.    senna-docusate (SENOKOT-S) 8.6-50 MG tablet Take 1 tablet by mouth daily as needed (constipation).    topiramate (TOPAMAX) 25 MG tablet TAKE 2 TABLETS BY MOUTH 2 TIMES DAILY.    Facility-Administered Encounter Medications as of 11/15/2020  Medication   lactated ringers infusion   Recent Relevant Labs: Lab Results  Component Value Date/Time   HGBA1C 7.6 (H) 07/30/2020 02:35 AM   HGBA1C 6.3 (H)  12/18/2019 08:15 AM   HGBA1C >14.0 (H) 01/28/2016 11:42 AM   MICROALBUR 0.6 12/18/2019 08:15 AM   MICROALBUR 1.1 05/22/2017 02:33 PM    Kidney Function Lab Results  Component Value Date/Time   CREATININE 0.83 07/29/2020 11:56 AM   CREATININE 0.70 07/14/2020 01:22 PM   CREATININE 0.86 12/18/2019 08:15 AM   CREATININE 0.96 06/02/2019 12:03 PM   GFRNONAA >60 07/29/2020 11:56 AM   GFRNONAA 95 12/18/2019 08:15 AM   GFRAA 110 12/18/2019 08:15 AM    Current antihyperglycemic regimen:  Empaglifolzin 25 mg 1 tablet daily Metformin 1017m twice daily with  meals  What recent interventions/DTPs have been made to improve glycemic control:  Empaglifolzin 25 mg 1 tablet daily  Have there been any recent hospitalizations or ED visits since last visit with [facility]? Patient chart does not show any ER Visits.   Adherence Review: Is the patient currently on a STATIN medication?  Atorvastatin 40 mg   Is the patient currently on ACE/ARB medication? N/A.   Does the patient have >5 day gap between last estimated fill dates? Per mis rtps.   Star Rating Drugs: Empagliflozin 25 mg 30 DS 11/09/20, Atorvastatin 40 mg 90 DS 11/09/20 , Metformin 100 mg 90 DS 10/06/20.   Third unsuccessful telephone outreach was attempted today. The patient was referred to the pharmacist for assistance with care management and care coordination.   Follow-Up:Pharmacist Review  VCharlann Lange RMinneotaPharmacist Assistant [phone]

## 2020-11-16 ENCOUNTER — Ambulatory Visit: Payer: Medicare Other | Admitting: Family Medicine

## 2020-11-19 DIAGNOSIS — G4733 Obstructive sleep apnea (adult) (pediatric): Secondary | ICD-10-CM | POA: Diagnosis not present

## 2020-11-25 ENCOUNTER — Other Ambulatory Visit: Payer: Self-pay | Admitting: Family Medicine

## 2020-12-13 ENCOUNTER — Telehealth: Payer: Self-pay | Admitting: Family Medicine

## 2020-12-13 NOTE — Telephone Encounter (Signed)
Left message for patient to call back and schedule Medicare Annual Wellness Visit (AWV) in office.  ° °If not able to come in office, please offer to do virtually or by telephone.  Left office number and my jabber #[phone]. ° °Due for AWVI ° °Please schedule at anytime with Nurse Health Advisor. °  °

## 2020-12-15 ENCOUNTER — Encounter: Payer: Self-pay | Admitting: Adult Health

## 2020-12-15 ENCOUNTER — Ambulatory Visit: Payer: Medicare Other | Admitting: Adult Health

## 2020-12-21 ENCOUNTER — Encounter: Payer: Self-pay | Admitting: Family Medicine

## 2020-12-21 ENCOUNTER — Ambulatory Visit (INDEPENDENT_AMBULATORY_CARE_PROVIDER_SITE_OTHER): Payer: Medicare Other | Admitting: Family Medicine

## 2020-12-21 ENCOUNTER — Other Ambulatory Visit: Payer: Self-pay

## 2020-12-21 VITALS — BP 132/68 | HR 88 | Temp 98.1°F | Resp 16 | Ht 65.0 in | Wt 276.0 lb

## 2020-12-21 DIAGNOSIS — E119 Type 2 diabetes mellitus without complications: Secondary | ICD-10-CM

## 2020-12-21 DIAGNOSIS — G4733 Obstructive sleep apnea (adult) (pediatric): Secondary | ICD-10-CM | POA: Diagnosis not present

## 2020-12-21 DIAGNOSIS — M722 Plantar fascial fibromatosis: Secondary | ICD-10-CM | POA: Diagnosis not present

## 2020-12-21 DIAGNOSIS — Z23 Encounter for immunization: Secondary | ICD-10-CM

## 2020-12-21 MED ORDER — MELOXICAM 15 MG PO TABS: 15.0000 mg | ORAL_TABLET | Freq: Every day | ORAL | 0 refills | Status: AC

## 2020-12-21 NOTE — Progress Notes (Signed)
Subjective:    Patient ID: Kenneth [name], male    DOB: 1960/05/19, 60 y.o.   MRN: [id number]  HPI  Patient presents today with right-sided knee pain.  Recently went to the emergency room due to severe pain in the right knee.  He complains of pain over the lateral joint line.  It hurts severely to put weight on his knee.  There is no erythema.  There is no effusion.  He also presents complaining of pain in the plantar aspect of his right foot near the insertion of the plantar fashion on his calcaneus.  It is tender to palpation in that area.  It hurts when he is walking.  He denies any falls or injuries to the foot.  There is no erythema.  There is no wound.  There is no apparent foreign body Past Medical History:  Diagnosis Date   Arthritis    Asthma    Chronic bronchitis ([place])    Diabetes mellitus without complication ([place])    type II   GERD (gastroesophageal reflux disease)    Headache    Hypercholesteremia    Hypertension    OSA treated with BiPAP    ahi-132, bipap 23/19   Past Surgical History:  Procedure Laterality Date   ESOPHAGOGASTRODUODENOSCOPY (EGD) WITH PROPOFOL N/A 12/11/2012   Procedure: ESOPHAGOGASTRODUODENOSCOPY (EGD) WITH PROPOFOL;  Surgeon: Arta Silence, MD;  [facility];  Service: Endoscopy;  Laterality: N/A;   FINGER AMPUTATION     KNEE ARTHROSCOPY Left 11/15/2015   Procedure: ARTHROSCOPY KNEE AND MEDIAL MENISECTOMY;  Surgeon: Paralee Cancel, MD;  [facility];  Service: Orthopedics;  Laterality: Left;   KNEE ARTHROSCOPY WITH LATERAL MENISECTOMY Right 07/14/2020   Procedure: RIGHT KNEE PARTIAL LATERAL MENISCECTOMY;  Surgeon: Leandrew Koyanagi, MD;  Location: [facility];  Service: Orthopedics;  Laterality: Right;   Current Outpatient Medications on File Prior to Visit  Medication Sig Dispense Refill   Accu-Chek Softclix Lancets lancets 2 (two) times daily.     AIMOVIG 70 MG/ML SOAJ INJECT 70 MG INTO THE SKIN EVERY 30 DAYS. 1 mL 3   albuterol (VENTOLIN HFA)  108 (90 Base) MCG/ACT inhaler INHALE 2 PUFFS INTO THE LUNGS EVERY 4 HOURS AS NEEDED FOR SHORTNESS OF BREATH (Patient taking differently: Inhale 2 puffs into the lungs every 4 (four) hours as needed for wheezing or shortness of breath.) 8.5 g 2   amLODipine (NORVASC) 5 MG tablet Take 1 tablet (5 mg total) by mouth daily. 90 tablet 3   aspirin 81 MG chewable tablet Chew 81 mg by mouth in the morning.     atorvastatin (LIPITOR) 40 MG tablet Take 1 tablet (40 mg total) by mouth daily. 90 tablet 3   blood glucose meter kit and supplies KIT Dispense based on patient and insurance preference. Use up to four times daily as directed. 1 each 11   Blood Glucose Monitoring Suppl (ACCU-CHEK AVIVA PLUS) w/Device KIT Use as directed to monitor FSBS 2x daily. Dx: E11.9 1 kit 1   empagliflozin (JARDIANCE) 25 MG TABS tablet Take 1 tablet (25 mg total) by mouth daily before breakfast. 30 tablet 5   Glycerin-Hypromellose-PEG 400 (VISINE DRY EYE) 0.2-0.2-1 % SOLN Place 1-2 drops into both eyes 3 (three) times daily as needed (dry/irritated eyes.).     HYDROcodone-acetaminophen (NORCO) 5-325 MG tablet Take 1-2 tablets by mouth 2 (two) times daily as needed. 20 tablet 0   metFORMIN (GLUCOPHAGE) 1000 MG tablet TAKE 1 TABLET BY MOUTH 2 TIMES DAILY  WITH A MEAL. (Patient taking differently: Take 1,000 mg by mouth 2 (two) times daily with a meal.) 180 tablet 3   nitroGLYCERIN (NITROSTAT) 0.4 MG SL tablet Place 1 tablet (0.4 mg total) under the tongue every 5 (five) minutes as needed. For chest pain (Patient taking differently: Place 0.4 mg under the tongue every 5 (five) minutes x 3 doses as needed for chest pain.) 20 tablet 1   omeprazole (PRILOSEC) 40 MG capsule TAKE 1 CAPSULE BY MOUTH DAILY. (Patient taking differently: Take 40 mg by mouth in the morning.) 90 capsule 3   senna-docusate (SENOKOT-S) 8.6-50 MG tablet Take 1 tablet by mouth daily as needed (constipation).     topiramate (TOPAMAX) 25 MG tablet TAKE 2 TABLETS BY  MOUTH 2 TIMES DAILY. 120 tablet 5   Current Facility-Administered Medications on File Prior to Visit  Medication Dose Route Frequency Provider Last Rate Last Admin   lactated ringers infusion    Continuous PRN Chyrel Masson, CRNA   New Bag at 11/15/15 1545   No Known Allergies Social History   Socioeconomic History   Marital status: Divorced    Spouse name: Not on file   Number of children: 0   Years of education: Not on file   Highest education level: Not on file  Occupational History   Not on file  Tobacco Use   Smoking status: Every Day    Packs/day: 0.50    Years: 40.00    Pack years: 20.00    Types: Cigarettes   Smokeless tobacco: Never   Tobacco comments:    06/05/17 1 pack per 1 1/2 days  Substance and Sexual Activity   Alcohol use: Yes    Comment: occasionally   Drug use: Yes    Types: Cocaine    Comment: patient states none- read lab report 09/02/2012/ 11-20-12 case cx.due to positive screen test-pt. advised no recreational drugs to be used   Sexual activity: Not on file  Other Topics Concern   Not on file  Social History Narrative   Lives with brother and his sister in [place].     Social Determinants of Health   Financial Resource Strain: Low Risk    Difficulty of Paying Living Expenses: Not very hard  Food Insecurity: Not on file  Transportation Needs: Not on file  Physical Activity: Not on file  Stress: Not on file  Social Connections: Not on file  Intimate Partner Violence: Not on file     Review of Systems  All other systems reviewed and are negative.      Objective:   Physical Exam  Cardiovascular: Normal rate, regular rhythm and normal heart sounds.   Pulmonary/Chest: Effort normal and breath sounds normal.  Musculoskeletal:       Right knee: He exhibits bony tenderness. He exhibits normal range of motion, no swelling, positive  effusion, no erythema, no LCL laxity, normal patellar mobility, painful meniscus(+ Apley grind) and no MCL  laxity. Tenderness found along lateral joint line.      Tender to palpation on the plantar aspect of the right foot near the insertion of the plantar fascia on the calcaneus      Assessment & Plan:   Type 2 diabetes mellitus without complication, without long-term current use of insulin ([place]) - Plan: CBC with Differential/Platelet, COMPLETE METABOLIC PANEL WITH GFR, Lipid panel, Hemoglobin A1c  Need for immunization against influenza - Plan: Flu Vaccine QUAD 26moIM (Fluarix, Fluzone & Alfiuria Quad PF)  Plantar fasciitis, right  Patient  has occasional pain in the right knee.  This is been a chronic problem since 2011 and I believe stems from his underlying meniscal tear.  Patient could have another cortisone injection however he would like to get his flu shot today.  Therefore I recommended not getting a cortisone injection.  Instead I will give the patient meloxicam 15 mg daily to try to help with the chronic pain in his right knee as well as the [place] fasciitis.  If not improving, he can return for cortisone injection in both his knee and his foot.

## 2020-12-22 LAB — CBC WITH DIFFERENTIAL/PLATELET
Absolute Monocytes: 640 cells/uL (ref 200–950)
Basophils Absolute: 51 cells/uL (ref 0–200)
Basophils Relative: 0.8 %
Eosinophils Absolute: 90 cells/uL (ref 15–500)
Eosinophils Relative: 1.4 %
HCT: 50.9 % — ABNORMAL HIGH (ref 38.5–50.0)
Hemoglobin: 16 g/dL (ref 13.2–17.1)
Lymphs Abs: 2854 cells/uL (ref 850–3900)
MCH: 26.5 pg — ABNORMAL LOW (ref 27.0–33.0)
MCHC: 31.4 g/dL — ABNORMAL LOW (ref 32.0–36.0)
MCV: 84.4 fL (ref 80.0–100.0)
MPV: 11.2 fL (ref 7.5–12.5)
Monocytes Relative: 10 %
Neutro Abs: 2765 cells/uL (ref 1500–7800)
Neutrophils Relative %: 43.2 %
Platelets: 273 10*3/uL (ref 140–400)
RBC: 6.03 10*6/uL — ABNORMAL HIGH (ref 4.20–5.80)
RDW: 13.8 % (ref 11.0–15.0)
Total Lymphocyte: 44.6 %
WBC: 6.4 10*3/uL (ref 3.8–10.8)

## 2020-12-22 LAB — COMPLETE METABOLIC PANEL WITH GFR
AG Ratio: 1.2 (calc) (ref 1.0–2.5)
ALT: 23 U/L (ref 9–46)
AST: 18 U/L (ref 10–35)
Albumin: 4.3 g/dL (ref 3.6–5.1)
Alkaline phosphatase (APISO): 66 U/L (ref 35–144)
BUN: 10 mg/dL (ref 7–25)
CO2: 26 mmol/L (ref 20–32)
Calcium: 9.5 mg/dL (ref 8.6–10.3)
Chloride: 104 mmol/L (ref 98–110)
Creat: 0.78 mg/dL (ref 0.70–1.35)
Globulin: 3.7 g/dL (calc) (ref 1.9–3.7)
Glucose, Bld: 102 mg/dL — ABNORMAL HIGH (ref 65–99)
Potassium: 4.5 mmol/L (ref 3.5–5.3)
Sodium: 140 mmol/L (ref 135–146)
Total Bilirubin: 0.4 mg/dL (ref 0.2–1.2)
Total Protein: 8 g/dL (ref 6.1–8.1)
eGFR: 102 mL/min/{1.73_m2} (ref >=60)

## 2020-12-22 LAB — HEMOGLOBIN A1C
Hgb A1c MFr Bld: 6.2 % of total Hgb — ABNORMAL HIGH (ref <5.7)
Mean Plasma Glucose: 131 mg/dL
eAG (mmol/L): 7.3 mmol/L

## 2020-12-22 LAB — LIPID PANEL
Cholesterol: 160 mg/dL (ref <200)
HDL: 50 mg/dL (ref >=40)
LDL Cholesterol (Calc): 93 mg/dL (calc)
Non-HDL Cholesterol (Calc): 110 mg/dL (calc) (ref <130)
Total CHOL/HDL Ratio: 3.2 (calc) (ref <5.0)
Triglycerides: 79 mg/dL (ref <150)

## 2020-12-23 NOTE — Progress Notes (Signed)
LVM for pt to rtn call 12/23/20

## 2020-12-24 ENCOUNTER — Ambulatory Visit (INDEPENDENT_AMBULATORY_CARE_PROVIDER_SITE_OTHER): Payer: Medicare Other | Admitting: Family Medicine

## 2020-12-24 ENCOUNTER — Other Ambulatory Visit: Payer: Self-pay

## 2020-12-24 VITALS — BP 110/60 | HR 96 | Temp 97.3°F | Ht 65.0 in | Wt 277.0 lb

## 2020-12-24 DIAGNOSIS — M25561 Pain in right knee: Secondary | ICD-10-CM

## 2020-12-24 DIAGNOSIS — G8929 Other chronic pain: Secondary | ICD-10-CM

## 2020-12-24 DIAGNOSIS — M722 Plantar fascial fibromatosis: Secondary | ICD-10-CM | POA: Diagnosis not present

## 2020-12-24 NOTE — Progress Notes (Signed)
Subjective:    Patient ID: Kenneth [name], male    DOB: 12/17/1960, 60 y.o.   MRN: [id number]  HPI  Patient presents today with right-sided knee pain.  Recently went to the emergency room due to severe pain in the right knee.  He complains of pain over the lateral joint line.  It hurts severely to put weight on his knee.  There is no erythema.  There is no effusion.  He also presents complaining of pain in the plantar aspect of his right foot near the insertion of the plantar fashion on his calcaneus.  It is tender to palpation in that area.  It hurts when he is walking.  He denies any falls or injuries to the foot.  There is no erythema.  There is no wound.  There is no apparent foreign body.  He requested cortisone shot for both Past Medical History:  Diagnosis Date   Arthritis    Asthma    Chronic bronchitis ([place])    Diabetes mellitus without complication ([place])    type II   GERD (gastroesophageal reflux disease)    Headache    Hypercholesteremia    Hypertension    OSA treated with BiPAP    ahi-132, bipap 23/19   Past Surgical History:  Procedure Laterality Date   ESOPHAGOGASTRODUODENOSCOPY (EGD) WITH PROPOFOL N/A 12/11/2012   Procedure: ESOPHAGOGASTRODUODENOSCOPY (EGD) WITH PROPOFOL;  Surgeon: Arta Silence, MD;  Location: [facility];  Service: Endoscopy;  Laterality: N/A;   FINGER AMPUTATION     KNEE ARTHROSCOPY Left 11/15/2015   Procedure: ARTHROSCOPY KNEE AND MEDIAL MENISECTOMY;  Surgeon: Paralee Cancel, MD;  Location: [facility];  Service: Orthopedics;  Laterality: Left;   KNEE ARTHROSCOPY WITH LATERAL MENISECTOMY Right 07/14/2020   Procedure: RIGHT KNEE PARTIAL LATERAL MENISCECTOMY;  Surgeon: Leandrew Koyanagi, MD;  Location: [facility];  Service: Orthopedics;  Laterality: Right;   Current Outpatient Medications on File Prior to Visit  Medication Sig Dispense Refill   Accu-Chek Softclix Lancets lancets 2 (two) times daily.     AIMOVIG 70 MG/ML SOAJ INJECT 70 MG INTO THE SKIN EVERY 30  DAYS. 1 mL 3   albuterol (VENTOLIN HFA) 108 (90 Base) MCG/ACT inhaler INHALE 2 PUFFS INTO THE LUNGS EVERY 4 HOURS AS NEEDED FOR SHORTNESS OF BREATH (Patient taking differently: Inhale 2 puffs into the lungs every 4 (four) hours as needed for wheezing or shortness of breath.) 8.5 g 2   amLODipine (NORVASC) 5 MG tablet Take 1 tablet (5 mg total) by mouth daily. 90 tablet 3   aspirin 81 MG chewable tablet Chew 81 mg by mouth in the morning.     atorvastatin (LIPITOR) 40 MG tablet Take 1 tablet (40 mg total) by mouth daily. 90 tablet 3   blood glucose meter kit and supplies KIT Dispense based on patient and insurance preference. Use up to four times daily as directed. 1 each 11   Blood Glucose Monitoring Suppl (ACCU-CHEK AVIVA PLUS) w/Device KIT Use as directed to monitor FSBS 2x daily. Dx: E11.9 1 kit 1   empagliflozin (JARDIANCE) 25 MG TABS tablet Take 1 tablet (25 mg total) by mouth daily before breakfast. 30 tablet 5   Glycerin-Hypromellose-PEG 400 (VISINE DRY EYE) 0.2-0.2-1 % SOLN Place 1-2 drops into both eyes 3 (three) times daily as needed (dry/irritated eyes.).     HYDROcodone-acetaminophen (NORCO) 5-325 MG tablet Take 1-2 tablets by mouth 2 (two) times daily as needed. 20 tablet 0   meloxicam (MOBIC) 15 MG tablet Take  1 tablet (15 mg total) by mouth daily. 30 tablet 0   metFORMIN (GLUCOPHAGE) 1000 MG tablet TAKE 1 TABLET BY MOUTH 2 TIMES DAILY WITH A MEAL. (Patient taking differently: Take 1,000 mg by mouth 2 (two) times daily with a meal.) 180 tablet 3   nitroGLYCERIN (NITROSTAT) 0.4 MG SL tablet Place 1 tablet (0.4 mg total) under the tongue every 5 (five) minutes as needed. For chest pain (Patient taking differently: Place 0.4 mg under the tongue every 5 (five) minutes x 3 doses as needed for chest pain.) 20 tablet 1   omeprazole (PRILOSEC) 40 MG capsule TAKE 1 CAPSULE BY MOUTH DAILY. (Patient taking differently: Take 40 mg by mouth in the morning.) 90 capsule 3   senna-docusate (SENOKOT-S)  8.6-50 MG tablet Take 1 tablet by mouth daily as needed (constipation).     topiramate (TOPAMAX) 25 MG tablet TAKE 2 TABLETS BY MOUTH 2 TIMES DAILY. 120 tablet 5   Current Facility-Administered Medications on File Prior to Visit  Medication Dose Route Frequency Provider Last Rate Last Admin   lactated ringers infusion    Continuous PRN Chyrel Masson, CRNA   New Bag at 11/15/15 1545   No Known Allergies Social History   Socioeconomic History   Marital status: Divorced    Spouse name: Not on file   Number of children: 0   Years of education: Not on file   Highest education level: Not on file  Occupational History   Not on file  Tobacco Use   Smoking status: Every Day    Packs/day: 0.50    Years: 40.00    Pack years: 20.00    Types: Cigarettes   Smokeless tobacco: Never   Tobacco comments:    06/05/17 1 pack per 1 1/2 days  Substance and Sexual Activity   Alcohol use: Yes    Comment: occasionally   Drug use: Yes    Types: Cocaine    Comment: patient states none- read lab report 09/02/2012/ 11-20-12 case cx.due to positive screen test-pt. advised no recreational drugs to be used   Sexual activity: Not on file  Other Topics Concern   Not on file  Social History Narrative   Lives with brother and his sister in [place].     Social Determinants of Health   Financial Resource Strain: Low Risk    Difficulty of Paying Living Expenses: Not very hard  Food Insecurity: Not on file  Transportation Needs: Not on file  Physical Activity: Not on file  Stress: Not on file  Social Connections: Not on file  Intimate Partner Violence: Not on file     Review of Systems  All other systems reviewed and are negative.      Objective:   Physical Exam  Cardiovascular: Normal rate, regular rhythm and normal heart sounds.   Pulmonary/Chest: Effort normal and breath sounds normal.  Musculoskeletal:       Right knee: He exhibits bony tenderness. He exhibits normal range of motion, no  swelling, positive  effusion, no erythema, no LCL laxity, normal patellar mobility, painful meniscus(+ Apley grind) and no MCL laxity. Tenderness found along lateral joint line.      Tender to palpation on the plantar aspect of the right foot near the insertion of the plantar fascia on the calcaneus      Assessment & Plan:  Plantar fasciitis, right  Chronic pain of right knee Using sterile technique, I injected the right knee with 2 cc of lidocaine, 2 cc of Marcaine,  and 2 cc of 40 mg/mL Kenalog.  The patient tolerated the procedure well without complication.  Next I prepped the foot in sterile fashion.  Near the insertion of the plantar fascia on the right calcaneus I injected a mixture of 1 cc of 0.1% lidocaine and 1 cc of 40 mg/mL Kenalog using a 23-gauge 1 inch needle down to the base of the calcaneus.  Patient tolerated the procedure well without complication

## 2021-01-10 NOTE — Progress Notes (Signed)
Chronic Care Management Pharmacy Note  01/20/2021 Name:  Kenneth [name] MRN:  [id number] DOB:  October 29, 1960  Subjective: Kenneth [name] is an 60 y.o. year old male who is a primary patient of Pickard, Cammie Mcgee, MD.  The CCM team was consulted for assistance with disease management and care coordination needs.    Engaged with patient by telephone for follow up visit in response to provider referral for pharmacy case management and/or care coordination services.   Consent to Services:  The patient was given the following information about Chronic Care Management services today, agreed to services, and gave verbal consent: 1. CCM service includes personalized support from designated clinical staff supervised by the primary care provider, including individualized plan of care and coordination with other care providers 2. 24/7 contact phone numbers for assistance for urgent and routine care needs. 3. Service will only be billed when office clinical staff spend 20 minutes or more in a month to coordinate care. 4. Only one practitioner may furnish and bill the service in a calendar month. 5.The patient may stop CCM services at any time (effective at the end of the month) by phone call to the office staff. 6. The patient will be responsible for cost sharing (co-pay) of up to 20% of the service fee (after annual deductible is met). Patient agreed to services and consent obtained.  Patient Care Team: [name], MD as PCP - General (Family Medicine) Stanford Breed Denice Bors, MD as PCP - Cardiology (Cardiology) Edythe Clarity, [facility] as Pharmacist (Pharmacist)  Recent office visits: 12/24/20 Dennard Schaumann) - patient received steroid injection in R knee as well as near plantar fascia of R foot.  Recent consult visits: 10/08/20 Stanford Breed) - For Syncope episodes, amlodipine decreased to 92m daily     Hospital visits:  None since most recent CCM call   Objective:  Lab Results  Component Value Date    CREATININE 0.78 12/21/2020   BUN 10 12/21/2020   GFRNONAA >60 07/29/2020   GFRAA 110 12/18/2019   NA 140 12/21/2020   K 4.5 12/21/2020   CALCIUM 9.5 12/21/2020   CO2 26 12/21/2020   GLUCOSE 102 (H) 12/21/2020    Lab Results  Component Value Date/Time   HGBA1C 6.2 (H) 12/21/2020 11:30 AM   HGBA1C 7.6 (H) 07/30/2020 02:35 AM   HGBA1C >14.0 (H) 01/28/2016 11:42 AM   MICROALBUR 0.6 12/18/2019 08:15 AM   MICROALBUR 1.1 05/22/2017 02:33 PM    Last diabetic Eye exam:  Lab Results  Component Value Date/Time   HMDIABEYEEXA No Retinopathy 03/29/2020 11:24 AM    Last diabetic Foot exam: No results found for: HMDIABFOOTEX   Lab Results  Component Value Date   CHOL 160 12/21/2020   HDL 50 12/21/2020   LDLCALC 93 12/21/2020   TRIG 79 12/21/2020   CHOLHDL 3.2 12/21/2020    Hepatic Function Latest Ref Rng & Units 12/21/2020 02/17/2020 12/18/2019  Total Protein 6.1 - 8.1 g/dL 8.0 8.0 8.0  Albumin 3.5 - 5.0 g/dL - 3.8 -  AST 10 - 35 U/L _0 ALT 9 - 46 U/L _1 Alk Phosphatase 38 - 126 U/L - 65 -  Total Bilirubin 0.2 - 1.2 mg/dL 0.4 0.2(L) 0.4    No results found for: TSH, FREET4  CBC Latest Ref Rng & Units 12/21/2020 07/29/2020 07/14/2020  WBC 3.8 - 10.8 Thousand/uL 6.4 8.6 -  Hemoglobin 13.2 - 17.1 g/dL 16.0 14.2 16.3  Hematocrit 38.5 - 50.0 %  50.9(H) 45.0 48.0  Platelets 140 - 400 Thousand/uL 273 311 -    Lab Results  Component Value Date/Time   VD25OH 11 (L) 05/12/2009 12:33 AM    Clinical ASCVD: No  The 10-year ASCVD risk score (Arnett DK, et al., 2019) is: 28.3%   Values used to calculate the score:     Age: 59 years     Sex: Male     Is Non-Hispanic African American: Yes     Diabetic: Yes     Tobacco smoker: Yes     Systolic Blood Pressure: 119 mmHg     Is BP treated: Yes     HDL Cholesterol: 50 mg/dL     Total Cholesterol: 160 mg/dL    Depression screen [facility] 2/9 12/24/2020 11/03/2019 05/22/2017  Decreased Interest 0 0 0  Down, Depressed, Hopeless 0 0 0   PHQ - 2 Score 0 0 0  Some recent data might be hidden      Social History   Tobacco Use  Smoking Status Every Day   Packs/day: 0.50   Years: 40.00   Pack years: 20.00   Types: Cigarettes  Smokeless Tobacco Never  Tobacco Comments   06/05/17 1 pack per 1 1/2 days   BP Readings from Last 3 Encounters:  12/24/20 110/60  12/21/20 132/68  10/15/20 126/84   Pulse Readings from Last 3 Encounters:  12/24/20 96  12/21/20 88  10/15/20 74   Wt Readings from Last 3 Encounters:  12/24/20 277 lb (125.6 kg)  12/21/20 276 lb (125.2 kg)  10/15/20 281 lb (127.5 kg)   BMI Readings from Last 3 Encounters:  12/24/20 46.10 kg/m  12/21/20 45.93 kg/m  10/15/20 46.76 kg/m    Assessment/Interventions: Review of patient past medical history, allergies, medications, health status, including review of consultants reports, laboratory and other test data, was performed as part of comprehensive evaluation and provision of chronic care management services.   SDOH:  (Social Determinants of Health) assessments and interventions performed: Yes   Financial Resource Strain: Low Risk    Difficulty of Paying Living Expenses: Not very hard    Emergency planning/management officer Strain: Low Risk    Difficulty of Paying Living Expenses: Not very hard    SDOH Screenings   Alcohol Screen: Not on file  Depression (PHQ2-9): Low Risk    PHQ-2 Score: 0  Financial Resource Strain: Low Risk    Difficulty of Paying Living Expenses: Not very hard  Food Insecurity: Not on file  Housing: Not on file  Physical Activity: Not on file  Social Connections: Not on file  Stress: Not on file  Tobacco Use: High Risk   Smoking Tobacco Use: Every Day   Smokeless Tobacco Use: Never   Passive Exposure: Not on file  Transportation Needs: Not on file    [place]  No Known Allergies  Medications Reviewed Today     Reviewed by Edythe Clarity, [facility] (Pharmacist) on 01/20/21 at [place] List Status: <None>    Medication Order Taking? Sig Documenting Provider Last Dose Status Informant  Accu-Chek Softclix Lancets lancets [id number] Yes 2 (two) times daily. [provider] Taking Active   AIMOVIG 70 MG/ML SOAJ [id number] Yes INJECT 70 MG INTO THE SKIN EVERY 30 DAYS. [name], MD Taking Active   albuterol (VENTOLIN HFA) 108 (90 Base) MCG/ACT inhaler [id number] Yes INHALE 2 PUFFS INTO THE LUNGS EVERY 4 HOURS AS NEEDED FOR SHORTNESS OF BREATH  Patient taking differently: Inhale 2 puffs into  the lungs every 4 (four) hours as needed for wheezing or shortness of breath.   [name], MD Taking Active   amLODipine (NORVASC) 5 MG tablet [id number] Yes Take 1 tablet (5 mg total) by mouth daily. Lelon Perla, MD Taking Active   aspirin 81 MG chewable tablet [id number] Yes Chew 81 mg by mouth in the morning. [provider] Taking Active Self  atorvastatin (LIPITOR) 40 MG tablet [id number] Yes Take 1 tablet (40 mg total) by mouth daily. [name], MD Taking Active   blood glucose meter kit and supplies KIT [id number] Yes Dispense based on patient and insurance preference. Use up to four times daily as directed. [name], MD Taking Active   Blood Glucose Monitoring Suppl (ACCU-CHEK AVIVA PLUS) w/Device KIT [id number] Yes Use as directed to monitor FSBS 2x daily. Dx: E11.9 [name], MD Taking Active Self  empagliflozin (JARDIANCE) 25 MG TABS tablet [id number] Yes Take 1 tablet (25 mg total) by mouth daily before breakfast. [name], MD Taking Active   Glycerin-Hypromellose-PEG 400 (VISINE DRY EYE) 0.2-0.2-1 % SOLN [id number] Yes Place 1-2 drops into both eyes 3 (three) times daily as needed (dry/irritated eyes.). [provider] Taking Active Self  HYDROcodone-acetaminophen (NORCO) 5-325 MG tablet [id number] Yes Take 1-2 tablets by mouth 2 (two) times daily as needed. [name], MD Taking Active   lactated ringers infusion [id number]    Chyrel Masson, CRNA  Active   meloxicam [facility]) 15 MG tablet [id number] Yes Take 1 tablet (15 mg total) by mouth daily. [name], MD Taking Active   metFORMIN (GLUCOPHAGE) 1000 MG tablet [id number] Yes TAKE 1 TABLET BY MOUTH 2 TIMES DAILY WITH A MEAL.  Patient taking differently: Take 1,000 mg by mouth 2 (two) times daily with a meal.   [name], MD Taking Active Self  nitroGLYCERIN (NITROSTAT) 0.4 MG SL tablet [id number] Yes Place 1 tablet (0.4 mg total) under the tongue every 5 (five) minutes as needed. For chest pain  Patient taking differently: Place 0.4 mg under the tongue every 5 (five) minutes x 3 doses as needed for chest pain.   Alycia Rossetti, MD Taking Active            Med Note Orma Render   Fri Oct 08, 2020 11:14 AM) Patient has on hand if needed  omeprazole (PRILOSEC) 40 MG capsule [id number] Yes TAKE 1 CAPSULE BY MOUTH DAILY.  Patient taking differently: Take 40 mg by mouth in the morning.   [name], MD Taking Active   senna-docusate (SENOKOT-S) 8.6-50 MG tablet [id number] Yes Take 1 tablet by mouth daily as needed (constipation). [provider] Taking Active Self  topiramate (TOPAMAX) 25 MG tablet [id number] Yes TAKE 2 TABLETS BY MOUTH 2 TIMES DAILY. [name], MD Taking Active   Med List Note Sharene Butters, CPhT 07/09/20 1004): Cpap at bedtime            Patient Active Problem List   Diagnosis Date Noted   Mixed diabetic hyperlipidemia associated with type 2 diabetes mellitus ([place]) 07/29/2020   Mild intermittent asthma without complication 09/20/9483   Essential hypertension 07/29/2020   GERD without esophagitis 07/29/2020   Acute cystitis without hematuria 07/29/2020   Bilateral pulmonary infiltrates on CXR 07/29/2020   Acute lateral meniscus tear of right knee 07/07/2020   Type 2 diabetes mellitus without complication, without long-term current use of insulin ([place]) 02/25/2019   OSA treated with  BiPAP     Syncope 05/13/2014   Asthma    Knee pain    Chest pain 10/23/2011   Dysphagia 10/23/2011   Obesity 10/23/2011    Immunization History  Administered Date(s) Administered   Influenza,inj,Quad PF,6+ Mos 12/25/2018, 01/26/2020, 12/21/2020   Moderna Sars-Covid-2 Vaccination 05/25/2019, 06/28/2019   Pneumococcal Polysaccharide-23 08/25/2016   Tdap 08/25/2016    Conditions to be addressed/monitored:  HTN, Asthma, GERD, Type II DM, HLD  Care Plan : General Pharmacy (Adult)  Updates made by Edythe Clarity, RPH since 01/20/2021 12:00 AM     Problem: HTN, Asthma, GERD, Type II DM, HLD   Priority: High  Onset Date: 08/13/2020     Long-Range Goal: Patient-Specific Goal   Start Date: 08/13/2020  Expected End Date: 02/13/2021  Recent Progress: On track  Priority: High  Note:   Current Barriers:  Unable to achieve control of glucose  Suboptimal therapeutic regimen for cholesterol  Pharmacist Clinical Goal(s):  Patient will achieve control of glucose and LDL as evidenced by monitoring adhere to plan to optimize therapeutic regimen for cholesterol as evidenced by report of adherence to recommended medication management changes contact provider office for questions/concerns as evidenced notation of same in electronic health record through collaboration with PharmD and provider.   Interventions: 1:1 collaboration with [name], MD regarding development and update of comprehensive plan of care as evidenced by provider attestation and co-signature Inter-disciplinary care team collaboration (see longitudinal plan of care) Comprehensive medication review performed; medication list updated in electronic medical record  Hypertension (BP goal <130/80) -Controlled -Current treatment: Amlodipine 46m daily -Medications previously tried: none noted   -Current home readings: 113/67-133/73 -Current dietary habits: patient has stopped drinking alcohol and is working on cutting back on  breads and other carbs -Current exercise habits: does something active daily -Denies hypotensive/hypertensive symptoms -Educated on BP goals and benefits of medications for prevention of heart attack, stroke and kidney damage; Exercise goal of 150 minutes per week; Importance of home blood pressure monitoring; Symptoms of hypotension and importance of maintaining adequate hydration; -Counseled to monitor BP at home daily, document, and provide log at future appointments -Recommended to continue current medication   Update 10/14/20 Amlodipine decreased to 542mby cardiology Recent home readings: He has not been checking Denies any recent dizziness and feelings of passing out.  Does report a recent fall where he slipped down rainy steps, attributes this to just slipping. Have asked him to monitor blood pressure at least once or twice weekly moving forward.  Continue current meds for now.  Hyperlipidemia: (LDL goal < 70) -Uncontrolled -Current treatment: Atorvastatin 4067maily -Medications previously tried: none noted  -Current dietary patterns: see above -Current exercise habits: see above -Educated on Cholesterol goals;  Benefits of statin for ASCVD risk reduction; Importance of limiting foods high in cholesterol; Exercise goal of 150 minutes per week;  -Reviewed most recent lipid panel, LDL elevated at 134 he tolerates Atorvastatin 64m69mnd with no concerns.  ASCVD risk is 32% making him a candidate for a high intensity statin. -Collaborated with PCP, would recommend increasing to Atorvastatin 40mg52mly to get to goal LDL.  Update 10/14/20 Taking new dose of Lipitor with no concerns. Encouraged continued adherence Recheck lipids in about another 30 days or next return for labs after that.  Update 01/20/21 LDL improved with new dose of Atorvastatin which was increased to 40mg.57m reports he is tolerating medication fine.  ASCVD risk now 28.3% which is still in  the high risk  category.  Counseled on importance of adherence to this medication.  He reports taking it daily every morning.   Continue current meds at this time, recommend recheck lipids at next annual physical.  Diabetes (A1c goal <7%) -Not ideally controlled -Current medications: Metformin 1062m twice daily with meals Jardiance 260mdaily -Medications previously tried: none noted  -Current home glucose readings fasting glucose: 108-165 post prandial glucose: 102-187 -Denies hypoglycemic/hyperglycemic symptoms -Current meal patterns: see above drinks: water, coke zero -Current exercise: see above -Educated on A1c and blood sugar goals; Complications of diabetes including kidney damage, retinal damage, and cardiovascular disease; Prevention and management of hypoglycemic episodes; Benefits of routine self-monitoring of blood sugar; -Counseled to check feet daily and get yearly eye exams -Recommended to continue current medication  -Continue to work on lifestyle changes, recommend repeat A1c around 3 months  Update 10/14/20 Recent fasting sugars: 130s - has not been monitoring like normal Have asked him to monitor and record at least fasting if possible. He is really concerned about his tail bone pain at this time and trying to get that straight. Will have DM call in 30 days to assess. Continue current meds  Update 01/20/2021 Recent glucose readings 110-187 Recently started Jardiance - has medicaid so cost is not an issue at this time.  He is working on lifestyle changes including limiting his carbohydrates (potatoes) and trying to do something active such as walking daily.  Reports adherence with meds.  Reported home glucose is controlled. Continue current meds no changes needed.  Asthma (Goal: control symptoms and prevent exacerbations) -Not ideally controlled -Current treatment  Albuterol HFA 9068mprn -Medications previously tried: none noted  -Exacerbations requiring treatment in  last 6 months: none -Patient denies consistent use of maintenance inhaler -Frequency of rescue inhaler use: as needed, usually during strenuous activity -Counseled on Proper inhaler technique; Benefits of consistent maintenance inhaler use  -Reports sometimes he will just be sitting and breathing heavy -Recommended to continue current medication Could consider controller medication such as Dulera for maintenance.  GERD (Goal: Minimize symptoms) -Controlled -Current treatment  Omeprazole 52m46mily -Medications previously tried: none noted -Denies any symptoms and takes appropriately  -Recommended to continue current medication   Patient Goals/Self-Care Activities Patient will:  - take medications as prescribed focus on medication adherence by pill box check glucose daily, document, and provide at future appointments check blood pressure daily, document, and provide at future appointments target a minimum of 150 minutes of moderate intensity exercise weekly  Follow Up Plan: The care management team will reach out to the patient again over the next 90 days.              Medication Assistance: None required.  Patient affirms current coverage meets needs.  [id number]: [phone]: [phone] [address] [place]: [phone]: [phone] pill box? Yes Pt endorses 100% compliance  We discussed: Benefits of medication synchronization, packaging and delivery as well as enhanced pharmacist oversight with [employer]. Patient decided to: Continue current medication management strategy  Care Plan and Follow Up Patient Decision:  Patient agrees to Care Plan and Follow-up.  Plan: The care management team will reach out to the patient again over the next 90  days.  ChriBeverly MilcharmD Clinical Pharmacist [employer]  Family Medicine [phone]

## 2021-01-15 NOTE — Progress Notes (Signed)
Cardiology Office Note:    Date:  01/21/2021   ID:  Kenneth [name], DOB 1961/02/12, MRN [id number]  PCP:  [name], MD  Cardiologist:  Kirk Ruths, MD  Electrophysiologist:  None   Referring MD: [name], MD   Chief Complaint: follow-up of syncope  History of Present Illness:    Kenneth [name] is a 60 y.o. male with a history of syncope felt to likely be due to orthostasis, hypertension, hyperlipidemia, type 2 diabetes mellitus, obstructive sleep apnea on BiPAP, GERD, asthma/chronic bronchitis , tobacco abuse , and cocaine abuse who is followed by Dr. Stanford Breed and presents today for follow-up of syncope.   Patient was referred to Dr. Stanford Breed in 09/2020 after not having been seen by Cardiology since 2016 for further evaluation of syncope. He had a episode of syncope and chest pain in 07/2020 and was admitted to [facility]. High-sensitivity troponin was negative and urine drug screen was positive for cocaine. Syncope was suggestive of orthostatic syncope given reports of lightheadedness upon arising form a resting position. Symptoms improved with IV fluids. Echo showed LVEF of 55-60% with normal wall motion and no significant valvular disease. Chest CTA showed no evidence of PE but did show calcification aof aorta and coronary arteries. Outpatient Cardiology evaluation was recommended. At initial visit with Dr. Stanford Breed, patient reported some dyspnea on exertion but no other CHF symptoms and no chest pain or recurrent syncope. He continued to note some lightheadedness with position changes so Amlodipine was decreased. Plan is for monitor if he has any recurrent syncope.  Patient presents today for follow-up. Here alone.  Patient doing well since last visit.  He denies any recurrent syncope.  He has no complaints today.  No chest pain, shortness of breath, orthopnea, PND, lower extremity edema, palpitations, lightheadedness, dizziness, syncope.  His BP is well controlled.  He does  continue to smoke but is trying to cut back.  He is currently smoking 1/2 pack per day (down from 1 full pack per day).  He states he is no longer using cocaine and states the last time he used was in July.  He states that his niece is making sure he stays away from cocaine.  Past Medical History:  Diagnosis Date   Arthritis    Asthma    Chronic bronchitis (HCC)    Cocaine abuse ([place])    GERD (gastroesophageal reflux disease)    Headache    Hyperlipidemia    Hypertension    OSA treated with BiPAP    ahi-132, bipap 23/19   Syncope    Tobacco abuse     Past Surgical History:  Procedure Laterality Date   ESOPHAGOGASTRODUODENOSCOPY (EGD) WITH PROPOFOL N/A 12/11/2012   Procedure: ESOPHAGOGASTRODUODENOSCOPY (EGD) WITH PROPOFOL;  Surgeon: Arta Silence, MD;  Location: [facility];  Service: Endoscopy;  Laterality: N/A;   FINGER AMPUTATION     KNEE ARTHROSCOPY Left 11/15/2015   Procedure: ARTHROSCOPY KNEE AND MEDIAL MENISECTOMY;  Surgeon: Paralee Cancel, MD;  Location: [facility];  Service: Orthopedics;  Laterality: Left;   KNEE ARTHROSCOPY WITH LATERAL MENISECTOMY Right 07/14/2020   Procedure: RIGHT KNEE PARTIAL LATERAL MENISCECTOMY;  Surgeon: Leandrew Koyanagi, MD;  Location: [place];  Service: Orthopedics;  Laterality: Right;    Current Medications: Current Meds  Medication Sig   Accu-Chek Softclix Lancets lancets 2 (two) times daily.   AIMOVIG 70 MG/ML SOAJ INJECT 70 MG INTO THE SKIN EVERY 30 DAYS.   albuterol (VENTOLIN HFA) 108 (90 Base) MCG/ACT  inhaler INHALE 2 PUFFS INTO THE LUNGS EVERY 4 HOURS AS NEEDED FOR SHORTNESS OF BREATH (Patient taking differently: Inhale 2 puffs into the lungs every 4 (four) hours as needed for wheezing or shortness of breath.)   amLODipine (NORVASC) 5 MG tablet Take 1 tablet (5 mg total) by mouth daily.   aspirin 81 MG chewable tablet Chew 81 mg by mouth in the morning.   atorvastatin (LIPITOR) 40 MG tablet Take 1 tablet (40 mg total) by mouth daily.   blood glucose  meter kit and supplies KIT Dispense based on patient and insurance preference. Use up to four times daily as directed.   Blood Glucose Monitoring Suppl (ACCU-CHEK AVIVA PLUS) w/Device KIT Use as directed to monitor FSBS 2x daily. Dx: E11.9   empagliflozin (JARDIANCE) 25 MG TABS tablet Take 1 tablet (25 mg total) by mouth daily before breakfast.   HYDROcodone-acetaminophen (NORCO) 5-325 MG tablet Take 1-2 tablets by mouth 2 (two) times daily as needed.   meloxicam (MOBIC) 15 MG tablet Take 1 tablet (15 mg total) by mouth daily.   metFORMIN (GLUCOPHAGE) 1000 MG tablet TAKE 1 TABLET BY MOUTH 2 TIMES DAILY WITH A MEAL. (Patient taking differently: Take 1,000 mg by mouth 2 (two) times daily with a meal.)   nitroGLYCERIN (NITROSTAT) 0.4 MG SL tablet Place 1 tablet (0.4 mg total) under the tongue every 5 (five) minutes as needed. For chest pain (Patient taking differently: Place 0.4 mg under the tongue every 5 (five) minutes x 3 doses as needed for chest pain.)   omeprazole (PRILOSEC) 40 MG capsule TAKE 1 CAPSULE BY MOUTH DAILY. (Patient taking differently: Take 40 mg by mouth in the morning.)   senna-docusate (SENOKOT-S) 8.6-50 MG tablet Take 1 tablet by mouth daily as needed (constipation).   topiramate (TOPAMAX) 25 MG tablet TAKE 2 TABLETS BY MOUTH 2 TIMES DAILY.     Allergies:   Patient has no known allergies.   Social History   Socioeconomic History   Marital status: Divorced    Spouse name: Not on file   Number of children: 0   Years of education: Not on file   Highest education level: Not on file  Occupational History   Not on file  Tobacco Use   Smoking status: Every Day    Packs/day: 0.50    Years: 40.00    Pack years: 20.00    Types: Cigarettes   Smokeless tobacco: Never   Tobacco comments:    06/05/17 1 pack per 1 1/2 days  Substance and Sexual Activity   Alcohol use: Yes    Comment: occasionally   Drug use: Yes    Types: Cocaine    Comment: patient states none- read lab  report 09/02/2012/ 11-20-12 case cx.due to positive screen test-pt. advised no recreational drugs to be used   Sexual activity: Not on file  Other Topics Concern   Not on file  Social History Narrative   Lives with brother and his sister in [place]     Social Determinants of Health   Financial Resource Strain: Low Risk    Difficulty of Paying Living Expenses: Not very hard  Food Insecurity: Not on file  Transportation Needs: Not on file  Physical Activity: Not on file  Stress: Not on file  Social Connections: Not on file     Family History: The patient's family history includes Diabetes in his brother, father, sister, and another family member; Hypertension in his father; Stroke in his sister.  ROS:   Please  see the history of present illness.     EKGs/Labs/Other Studies Reviewed:    The following studies were reviewed today:  Echocardiogram 07/30/2020: Impressions: 1. Left ventricular ejection fraction, by estimation, is 55 to 60%. The  left ventricle has normal function. The left ventricle has no regional  wall motion abnormalities. Left ventricular diastolic parameters were  normal.   2. Right ventricular systolic function is normal. The right ventricular  size is normal. Tricuspid regurgitation signal is inadequate for assessing  PA pressure.   3. The mitral valve is grossly normal. Trivial mitral valve  regurgitation. No evidence of mitral stenosis.   4. The aortic valve is tricuspid. There is mild calcification of the  aortic valve. Aortic valve regurgitation is not visualized. No aortic  stenosis is present.   5. The inferior vena cava is normal in size with greater than 50%  respiratory variability, suggesting right atrial pressure of 3 mmHg.   EKG:  EKG not ordered today.   Recent Labs: 07/29/2020: B Natriuretic Peptide 7.5 12/21/2020: ALT 23; BUN 10; Creat 0.78; Hemoglobin 16.0; Platelets 273; Potassium 4.5; Sodium 140  Recent Lipid Panel    Component Value  Date/Time   CHOL 160 12/21/2020 1130   TRIG 79 12/21/2020 1130   HDL 50 12/21/2020 1130   CHOLHDL 3.2 12/21/2020 1130   VLDL 18 07/30/2020 0235   LDLCALC 93 12/21/2020 1130    Physical Exam:    Vital Signs: BP 122/78 (BP Location: Left Arm, Patient Position: Sitting, Cuff Size: Normal)   Pulse 87   Resp 20   Ht 5\' 6"  (1.676 m)   Wt 276 lb (125.2 kg)   SpO2 96%   BMI 44.55 kg/m     Wt Readings from Last 3 Encounters:  01/21/21 276 lb (125.2 kg)  12/24/20 277 lb (125.6 kg)  12/21/20 276 lb (125.2 kg)     General: 59 y.o. African-American male in no acute distress. HEENT: Normocephalic and atraumatic. Sclera clear.  Neck: Supple. No carotid bruits. No JVD. Heart: RRR. Distinct S1 and S2. No murmurs, gallops, or rubs. Radial pulses 2+ and equal bilaterally. Lungs: No increased work of breathing. Clear to ausculation bilaterally. No wheezes, rhonchi, or rales.  Abdomen: Soft, non-distended, and non-tender to palpation.  MSK: Normal strength and tone for age.  Extremities: No lower extremity edema.    Skin: Warm and dry. Neuro: Alert and oriented x3. No focal deficits. Psych: Normal affect. Responds appropriately.  Assessment:    1. History of syncope   2. Primary hypertension   3. Hyperlipidemia, unspecified hyperlipidemia type   4. Tobacco abuse   5. Cocaine abuse ([place])     Plan:    History of Syncope - History of syncope in 09/2020. Etiology unclear but felt to likely be orthostatic in nature. Echo showed normal LV function and no significant valvular disease.  - No recurrence.  - If he has recurrent episodes, will get monitor.   Hypertension - BP well controlled. - Continue Amlodipine 5mg  daily.   Hyperlipidemia - Lipid panel in 11/2020: Total Cholesterol 160, Triglycerides 79, HDL 50, LDL 93.  - Continue Lipitor 40mg  daily.  - Managed by PCP.  Tobacco Abuse Cocaine Abuse - He continues to smoke cigarettes but is trying to cut back. Down to 1/2 pack per  day. Congratulated him on the progress he has made so far and emphasized the importance of complete cessation. Encouraged him to continue to work towards this goal. Offered Nicotine patch/gum but patient states  that has not worked in the past. - He states he has not used any cocaine since July. Emphasized the importance of staying away from this.  Disposition: Follow up in 6 months.   Medication Adjustments/Labs and Tests Ordered: Current medicines are reviewed at length with the patient today.  Concerns regarding medicines are outlined above.  No orders of the defined types were placed in this encounter.  No orders of the defined types were placed in this encounter.   There are no Patient Instructions on file for this visit.   Signed, Darreld Mclean, PA-C  01/21/2021 1:39 PM    [facility]

## 2021-01-20 ENCOUNTER — Ambulatory Visit (INDEPENDENT_AMBULATORY_CARE_PROVIDER_SITE_OTHER): Payer: Medicare Other | Admitting: Pharmacist

## 2021-01-20 ENCOUNTER — Encounter: Payer: Self-pay | Admitting: Student

## 2021-01-20 DIAGNOSIS — E119 Type 2 diabetes mellitus without complications: Secondary | ICD-10-CM

## 2021-01-20 DIAGNOSIS — E1169 Type 2 diabetes mellitus with other specified complication: Secondary | ICD-10-CM

## 2021-01-20 NOTE — Patient Instructions (Addendum)
Visit Information   Goals Addressed             This Visit's Progress    Monitor and Manage My Blood Sugar-Diabetes Type 2   On track    Timeframe:  Long-Range Goal Priority:  High Start Date: 08/13/20                            Expected End Date: 02/13/21                      Follow Up Date 11/24/20   - check blood sugar at prescribed times - check blood sugar before and after exercise - enter blood sugar readings and medication or insulin into daily log - take the blood sugar log to all doctor visits    Why is this important?   Checking your blood sugar at home helps to keep it from getting very high or very low.  Writing the results in a diary or log helps the doctor know how to care for you.  Your blood sugar log should have the time, date and the results.  Also, write down the amount of insulin or other medicine that you take.  Other information, like what you ate, exercise done and how you were feeling, will also be helpful.     Notes:   01/20/21 - Patient started on Jardiance prior to this visit.       Patient Care Plan: General Pharmacy (Adult)     Problem Identified: HTN, Asthma, GERD, Type II DM, HLD   Priority: High  Onset Date: 08/13/2020     Long-Range Goal: Patient-Specific Goal   Start Date: 08/13/2020  Expected End Date: 02/13/2021  Recent Progress: On track  Priority: High  Note:   Current Barriers:  Unable to achieve control of glucose  Suboptimal therapeutic regimen for cholesterol  Pharmacist Clinical Goal(s):  Patient will achieve control of glucose and LDL as evidenced by monitoring adhere to plan to optimize therapeutic regimen for cholesterol as evidenced by report of adherence to recommended medication management changes contact provider office for questions/concerns as evidenced notation of same in electronic health record through collaboration with PharmD and provider.   Interventions: 1:1 collaboration with [name], MD  regarding development and update of comprehensive plan of care as evidenced by provider attestation and co-signature Inter-disciplinary care team collaboration (see longitudinal plan of care) Comprehensive medication review performed; medication list updated in electronic medical record  Hypertension (BP goal <130/80) -Controlled -Current treatment: Amlodipine 5mg  daily -Medications previously tried: none noted   -Current home readings: 113/67-133/73 -Current dietary habits: patient has stopped drinking alcohol and is working on cutting back on breads and other carbs -Current exercise habits: does something active daily -Denies hypotensive/hypertensive symptoms -Educated on BP goals and benefits of medications for prevention of heart attack, stroke and kidney damage; Exercise goal of 150 minutes per week; Importance of home blood pressure monitoring; Symptoms of hypotension and importance of maintaining adequate hydration; -Counseled to monitor BP at home daily, document, and provide log at future appointments -Recommended to continue current medication   Update 10/14/20 Amlodipine decreased to 5mg  by cardiology Recent home readings: He has not been checking Denies any recent dizziness and feelings of passing out.  Does report a recent fall where he slipped down rainy steps, attributes this to just slipping. Have asked him to monitor blood pressure at least once or twice weekly moving forward.  Continue current meds for now.  Hyperlipidemia: (LDL goal < 70) -Uncontrolled -Current treatment: Atorvastatin 40mg  daily -Medications previously tried: none noted  -Current dietary patterns: see above -Current exercise habits: see above -Educated on Cholesterol goals;  Benefits of statin for ASCVD risk reduction; Importance of limiting foods high in cholesterol; Exercise goal of 150 minutes per week;  -Reviewed most recent lipid panel, LDL elevated at 134 he tolerates Atorvastatin 10mg   find with no concerns.  ASCVD risk is 32% making him a candidate for a high intensity statin. -Collaborated with PCP, would recommend increasing to Atorvastatin 40mg  daily to get to goal LDL.  Update 10/14/20 Taking new dose of Lipitor with no concerns. Encouraged continued adherence Recheck lipids in about another 30 days or next return for labs after that.  Update 01/20/21 LDL improved with new dose of Atorvastatin which was increased to 40mg .  He reports he is tolerating medication fine.  ASCVD risk now 28.3% which is still in the high risk category.  Counseled on importance of adherence to this medication.  He reports taking it daily every morning.   Continue current meds at this time, recommend recheck lipids at next annual physical.  Diabetes (A1c goal <7%) -Not ideally controlled -Current medications: Metformin 1000mg  twice daily with meals Jardiance 25mg  daily -Medications previously tried: none noted  -Current home glucose readings fasting glucose: 108-165 post prandial glucose: 102-187 -Denies hypoglycemic/hyperglycemic symptoms -Current meal patterns: see above drinks: water, coke zero -Current exercise: see above -Educated on A1c and blood sugar goals; Complications of diabetes including kidney damage, retinal damage, and cardiovascular disease; Prevention and management of hypoglycemic episodes; Benefits of routine self-monitoring of blood sugar; -Counseled to check feet daily and get yearly eye exams -Recommended to continue current medication  -Continue to work on lifestyle changes, recommend repeat A1c around 3 months  Update 10/14/20 Recent fasting sugars: 130s - has not been monitoring like normal Have asked him to monitor and record at least fasting if possible. He is really concerned about his tail bone pain at this time and trying to get that straight. Will have DM call in 30 days to assess. Continue current meds  Update 01/20/2021 Recent glucose readings  110-187 Recently started Jardiance - has medicaid so cost is not an issue at this time.  He is working on lifestyle changes including limiting his carbohydrates (potatoes) and trying to do something active such as walking daily.  Reports adherence with meds.  Reported home glucose is controlled. Continue current meds no changes needed.  Asthma (Goal: control symptoms and prevent exacerbations) -Not ideally controlled -Current treatment  Albuterol HFA 83mcg prn -Medications previously tried: none noted  -Exacerbations requiring treatment in last 6 months: none -Patient denies consistent use of maintenance inhaler -Frequency of rescue inhaler use: as needed, usually during strenuous activity -Counseled on Proper inhaler technique; Benefits of consistent maintenance inhaler use  -Reports sometimes he will just be sitting and breathing heavy -Recommended to continue current medication Could consider controller medication such as Dulera for maintenance.  GERD (Goal: Minimize symptoms) -Controlled -Current treatment  Omeprazole 40mg  daily -Medications previously tried: none noted -Denies any symptoms and takes appropriately  -Recommended to continue current medication   Patient Goals/Self-Care Activities Patient will:  - take medications as prescribed focus on medication adherence by pill box check glucose daily, document, and provide at future appointments check blood pressure daily, document, and provide at future appointments target a minimum of 150 minutes of moderate intensity exercise weekly  Follow Up  Plan: The care management team will reach out to the patient again over the next 90 days.            Patient verbalizes understanding of instructions provided today and agrees to view in [place].  Telephone follow up appointment with pharmacy team member scheduled for: 6 months  Edythe Clarity, [place]

## 2021-01-21 ENCOUNTER — Other Ambulatory Visit: Payer: Self-pay

## 2021-01-21 ENCOUNTER — Encounter: Payer: Self-pay | Admitting: Student

## 2021-01-21 ENCOUNTER — Ambulatory Visit (INDEPENDENT_AMBULATORY_CARE_PROVIDER_SITE_OTHER): Payer: Medicare Other | Admitting: Student

## 2021-01-21 VITALS — BP 122/78 | HR 87 | Resp 20 | Ht 66.0 in | Wt 276.0 lb

## 2021-01-21 DIAGNOSIS — Z87898 Personal history of other specified conditions: Secondary | ICD-10-CM

## 2021-01-21 DIAGNOSIS — I10 Essential (primary) hypertension: Secondary | ICD-10-CM | POA: Diagnosis not present

## 2021-01-21 DIAGNOSIS — Z720 Tobacco use: Secondary | ICD-10-CM

## 2021-01-21 DIAGNOSIS — E785 Hyperlipidemia, unspecified: Secondary | ICD-10-CM

## 2021-01-21 DIAGNOSIS — F1410 Cocaine abuse, uncomplicated: Secondary | ICD-10-CM

## 2021-01-21 NOTE — Patient Instructions (Signed)
Medication Instructions:  The current medical regimen is effective;  continue present plan and medications as directed. Please refer to the Current Medication list given to you today.   *If you need a refill on your cardiac medications before your next appointment, please call your pharmacy*  Lab Work:   Testing/Procedures:  NONE    NONE  Follow-Up: Your next appointment:  6 month(s) In Person with You may see Kirk Ruths, MD or one of the following Advanced Practice Providers on your designated Care Team:   [name], PA-C Coletta Memos, FNP   Please call our office 2 months in advance to schedule this appointment   At [facility], you and your health needs are our priority.  As part of our continuing mission to provide you with exceptional heart care, we have created designated Provider Care Teams.  These Care Teams include your primary Cardiologist (physician) and Advanced Practice Providers (APPs -  Physician Assistants and Nurse Practitioners) who all work together to provide you with the care you need, when you need it.

## 2021-01-21 NOTE — Addendum Note (Signed)
Addended by: [name] on: 01/21/2021 01:41 PM   Modules accepted: Level of Service

## 2021-01-24 DIAGNOSIS — E782 Mixed hyperlipidemia: Secondary | ICD-10-CM

## 2021-01-24 DIAGNOSIS — E1169 Type 2 diabetes mellitus with other specified complication: Secondary | ICD-10-CM

## 2021-01-24 DIAGNOSIS — E119 Type 2 diabetes mellitus without complications: Secondary | ICD-10-CM | POA: Diagnosis not present

## 2021-02-08 ENCOUNTER — Ambulatory Visit (INDEPENDENT_AMBULATORY_CARE_PROVIDER_SITE_OTHER): Payer: Medicare Other | Admitting: Family Medicine

## 2021-02-08 ENCOUNTER — Encounter: Payer: Self-pay | Admitting: Family Medicine

## 2021-02-08 ENCOUNTER — Other Ambulatory Visit: Payer: Self-pay

## 2021-02-08 VITALS — BP 128/68 | HR 92 | Temp 97.5°F | Resp 18 | Ht 66.0 in | Wt 277.0 lb

## 2021-02-08 DIAGNOSIS — M79604 Pain in right leg: Secondary | ICD-10-CM

## 2021-02-08 MED ORDER — GABAPENTIN 300 MG PO CAPS: 300.0000 mg | ORAL_CAPSULE | Freq: Three times a day (TID) | ORAL | 3 refills | Status: DC | PRN

## 2021-02-08 NOTE — Progress Notes (Signed)
Subjective:    Patient ID: Kenneth [name], male    DOB: 03/19/1961, 60 y.o.   MRN: [id number]  HPI  I saw the patient in September for right knee pain.  He has a history of meniscal repair in April.  I performed a cortisone injection to help with the knee pain.  However he now reports a burning stinging pain radiating down his right leg from around the fibula down to the lateral shin just above the ankle.  Is a burning fiery like pain.  He states the skin feels hot and tingling to the touch.  It sounds neuropathic.  There is no visible rash.  There is no erythema.  There is no palpable warmth.  He does not have any tenderness to palpation over the joint line.  He denies any pain in his knee with flexion or extension or walking Past Medical History:  Diagnosis Date   Arthritis    Asthma    Chronic bronchitis (HCC)    Cocaine abuse ([place])    GERD (gastroesophageal reflux disease)    Headache    Hyperlipidemia    Hypertension    OSA treated with BiPAP    ahi-132, bipap 23/19   Syncope    Tobacco abuse    Past Surgical History:  Procedure Laterality Date   ESOPHAGOGASTRODUODENOSCOPY (EGD) WITH PROPOFOL N/A 12/11/2012   Procedure: ESOPHAGOGASTRODUODENOSCOPY (EGD) WITH PROPOFOL;  Surgeon: Arta Silence, MD;  [facility];  Service: Endoscopy;  Laterality: N/A;   FINGER AMPUTATION     KNEE ARTHROSCOPY Left 11/15/2015   Procedure: ARTHROSCOPY KNEE AND MEDIAL MENISECTOMY;  Surgeon: Paralee Cancel, MD;  [facility];  Service: Orthopedics;  Laterality: Left;   KNEE ARTHROSCOPY WITH LATERAL MENISECTOMY Right 07/14/2020   Procedure: RIGHT KNEE PARTIAL LATERAL MENISCECTOMY;  Surgeon: Leandrew Koyanagi, MD;  Location: [facility];  Service: Orthopedics;  Laterality: Right;   Current Outpatient Medications on File Prior to Visit  Medication Sig Dispense Refill   Accu-Chek Softclix Lancets lancets 2 (two) times daily.     AIMOVIG 70 MG/ML SOAJ INJECT 70 MG INTO THE SKIN EVERY 30 DAYS. 1 mL 3    albuterol (VENTOLIN HFA) 108 (90 Base) MCG/ACT inhaler INHALE 2 PUFFS INTO THE LUNGS EVERY 4 HOURS AS NEEDED FOR SHORTNESS OF BREATH (Patient taking differently: Inhale 2 puffs into the lungs every 4 (four) hours as needed for wheezing or shortness of breath.) 8.5 g 2   amLODipine (NORVASC) 5 MG tablet Take 1 tablet (5 mg total) by mouth daily. 90 tablet 3   aspirin 81 MG chewable tablet Chew 81 mg by mouth in the morning.     atorvastatin (LIPITOR) 40 MG tablet Take 1 tablet (40 mg total) by mouth daily. 90 tablet 3   blood glucose meter kit and supplies KIT Dispense based on patient and insurance preference. Use up to four times daily as directed. 1 each 11   Blood Glucose Monitoring Suppl (ACCU-CHEK AVIVA PLUS) w/Device KIT Use as directed to monitor FSBS 2x daily. Dx: E11.9 1 kit 1   empagliflozin (JARDIANCE) 25 MG TABS tablet Take 1 tablet (25 mg total) by mouth daily before breakfast. 30 tablet 5   HYDROcodone-acetaminophen (NORCO) 5-325 MG tablet Take 1-2 tablets by mouth 2 (two) times daily as needed. 20 tablet 0   meloxicam (MOBIC) 15 MG tablet Take 1 tablet (15 mg total) by mouth daily. 30 tablet 0   metFORMIN (GLUCOPHAGE) 1000 MG tablet TAKE 1 TABLET BY MOUTH 2  TIMES DAILY WITH A MEAL. (Patient taking differently: Take 1,000 mg by mouth 2 (two) times daily with a meal.) 180 tablet 3   nitroGLYCERIN (NITROSTAT) 0.4 MG SL tablet Place 1 tablet (0.4 mg total) under the tongue every 5 (five) minutes as needed. For chest pain (Patient taking differently: Place 0.4 mg under the tongue every 5 (five) minutes x 3 doses as needed for chest pain.) 20 tablet 1   omeprazole (PRILOSEC) 40 MG capsule TAKE 1 CAPSULE BY MOUTH DAILY. (Patient taking differently: Take 40 mg by mouth in the morning.) 90 capsule 3   senna-docusate (SENOKOT-S) 8.6-50 MG tablet Take 1 tablet by mouth daily as needed (constipation).     topiramate (TOPAMAX) 25 MG tablet TAKE 2 TABLETS BY MOUTH 2 TIMES DAILY. 120 tablet 5    Current Facility-Administered Medications on File Prior to Visit  Medication Dose Route Frequency Provider Last Rate Last Admin   lactated ringers infusion    Continuous PRN Chyrel Masson, CRNA   New Bag at 11/15/15 1545   No Known Allergies Social History   Socioeconomic History   Marital status: Divorced    Spouse name: Not on file   Number of children: 0   Years of education: Not on file   Highest education level: Not on file  Occupational History   Not on file  Tobacco Use   Smoking status: Every Day    Packs/day: 0.50    Years: 40.00    Pack years: 20.00    Types: Cigarettes   Smokeless tobacco: Never   Tobacco comments:    06/05/17 1 pack per 1 1/2 days  Substance and Sexual Activity   Alcohol use: Yes    Comment: occasionally   Drug use: Yes    Types: Cocaine    Comment: patient states none- read lab report 09/02/2012/ 11-20-12 case cx.due to positive screen test-pt. advised no recreational drugs to be used   Sexual activity: Not on file  Other Topics Concern   Not on file  Social History Narrative   Lives with brother and his sister in [employer].     Social Determinants of Health   Financial Resource Strain: Low Risk    Difficulty of Paying Living Expenses: Not very hard  Food Insecurity: Not on file  Transportation Needs: Not on file  Physical Activity: Not on file  Stress: Not on file  Social Connections: Not on file  Intimate Partner Violence: Not on file     Review of Systems  All other systems reviewed and are negative.     Objective:   Physical Exam Vitals reviewed.  Constitutional:      Appearance: He is obese.  Cardiovascular:     Rate and Rhythm: Normal rate and regular rhythm.  Pulmonary:     Effort: Pulmonary effort is normal.     Breath sounds: Normal breath sounds.  Musculoskeletal:     Right knee: No swelling, effusion, erythema or bony tenderness. Normal range of motion. No tenderness.       Legs:  Neurological:     Mental  Status: He is alert.          Assessment & Plan:   Right leg pain This sounds almost neuropathic in nature.  I do not believe that this is related to his knee pain.  Therefore I will try the patient on gabapentin 300 mg p.o. 3 times daily and reassess in 2 weeks to see how he is doing.  Consider nerve conduction  studies if persistent or worsening

## 2021-03-01 ENCOUNTER — Telehealth: Payer: Self-pay | Admitting: Pharmacist

## 2021-03-01 NOTE — Progress Notes (Addendum)
Chronic Care Management Pharmacy Assistant   Name: Kenneth [name]  MRN: [id number] DOB: 1960/09/22   Reason for Encounter: Disease State - Diabetes Call     Recent office visits:  02/08/21 [name], MD (PCP) - Family Medicine - Leg pain (Right) - gabapentin (NEURONTIN) 300 MG capsule Take 1 capsule (300 mg total) by mouth 3 (three) times daily as needed prescribed.   Recent consult visits:  01/21/21 [name], Pa-C - Cardiology - Syncope - No medication changes. Follow up in 6 months.   Hospital visits:  None in previous 6 months  Medications: Outpatient Encounter Medications as of 03/01/2021  Medication Sig Note   Accu-Chek Softclix Lancets lancets 2 (two) times daily.    AIMOVIG 70 MG/ML SOAJ INJECT 70 MG INTO THE SKIN EVERY 30 DAYS.    albuterol (VENTOLIN HFA) 108 (90 Base) MCG/ACT inhaler INHALE 2 PUFFS INTO THE LUNGS EVERY 4 HOURS AS NEEDED FOR SHORTNESS OF BREATH (Patient taking differently: Inhale 2 puffs into the lungs every 4 (four) hours as needed for wheezing or shortness of breath.)    amLODipine (NORVASC) 5 MG tablet Take 1 tablet (5 mg total) by mouth daily.    aspirin 81 MG chewable tablet Chew 81 mg by mouth in the morning.    atorvastatin (LIPITOR) 40 MG tablet Take 1 tablet (40 mg total) by mouth daily.    blood glucose meter kit and supplies KIT Dispense based on patient and insurance preference. Use up to four times daily as directed.    Blood Glucose Monitoring Suppl (ACCU-CHEK AVIVA PLUS) w/Device KIT Use as directed to monitor FSBS 2x daily. Dx: E11.9    empagliflozin (JARDIANCE) 25 MG TABS tablet Take 1 tablet (25 mg total) by mouth daily before breakfast.    gabapentin (NEURONTIN) 300 MG capsule Take 1 capsule (300 mg total) by mouth 3 (three) times daily as needed (nerve pain).    HYDROcodone-acetaminophen (NORCO) 5-325 MG tablet Take 1-2 tablets by mouth 2 (two) times daily as needed.    meloxicam (MOBIC) 15 MG tablet Take 1 tablet (15 mg total)  by mouth daily.    metFORMIN (GLUCOPHAGE) 1000 MG tablet TAKE 1 TABLET BY MOUTH 2 TIMES DAILY WITH A MEAL. (Patient taking differently: Take 1,000 mg by mouth 2 (two) times daily with a meal.)    nitroGLYCERIN (NITROSTAT) 0.4 MG SL tablet Place 1 tablet (0.4 mg total) under the tongue every 5 (five) minutes as needed. For chest pain (Patient taking differently: Place 0.4 mg under the tongue every 5 (five) minutes x 3 doses as needed for chest pain.) 10/08/2020: Patient has on hand if needed   omeprazole (PRILOSEC) 40 MG capsule TAKE 1 CAPSULE BY MOUTH DAILY. (Patient taking differently: Take 40 mg by mouth in the morning.)    senna-docusate (SENOKOT-S) 8.6-50 MG tablet Take 1 tablet by mouth daily as needed (constipation).    topiramate (TOPAMAX) 25 MG tablet TAKE 2 TABLETS BY MOUTH 2 TIMES DAILY.    Facility-Administered Encounter Medications as of 03/01/2021  Medication   lactated ringers infusion    Current antihyperglycemic regimen:  Amlodipine 52m daily   What recent interventions/DTPs have been made to improve glycemic control:  Patient denied any changes in his current regimen.   Have there been any recent hospitalizations or ED visits since last visit with CPP?  Patient has not had any hospitalizations or ED visits since last visit with CPP.  Patient denies hypoglycemic symptoms, including Pale, Sweaty, Shaky, Hungry, Nervous/irritable,  and Vision changes   Patient denies hyperglycemic symptoms, including blurry vision, excessive thirst, fatigue, polyuria, and weakness   How often are you checking your blood sugar? Patient reported he is checking his blood sugars once daily    What are your blood sugars ranging?  Fasting: 130 has been his highest reading  Before meals:  After meals:  Bedtime:   During the week, how often does your blood glucose drop below 70?  Patient denied any readings lower than 70.  Are you checking your feet daily/regularly? Patient reported he is  checking his feet regularly. He currently has no concerns.     Adherence Review: Is the patient currently on a STATIN medication? Yes Is the patient currently on ACE/ARB medication? No Does the patient have >5 day gap between last estimated fill dates? No  Amlodipine 78m daily - last filled 01/05/21 90 days    Care Gaps  AWV: done 12/13/20 Colonoscopy: due 03/27/26 DM Eye Exam: due 03/29/21 DM Foot Exam: due 06/12/17 Microalbumin: done 12/18/19 HbgAIC: done 12/21/20 (6.2) DEXA: N/A Mammogram: N/A  Star Rating Drugs: metFORMIN (GLUCOPHAGE) 1000 MG tablet - last filled 01/17/21 90 days  atorvastatin (LIPITOR) 40 MG tablet - last filled 11/09/20 90 days   Future Appointments  Date Time Provider [place] 03/08/2021  8:45 AM AStar Age MD [facility] None  03/11/2021  2:00 PM CLelon Perla [facility] 07/07/2021  2:15 PM [facility] PHARMACIST [facility] None    [name] CWest MonroeClinical Pharmacist Assistant  [phone]

## 2021-03-07 NOTE — Progress Notes (Deleted)
HPI: FU syncope.  Echocardiogram May 2022 showed normal LV function.  CTA May 2022 showed no pulmonary embolus.  Aortic atherosclerosis noted.  Patient had syncopal episode May 2022.  Urine drug screen positive for cocaine.  Troponins normal.  BNP 7.5.  Episode felt likely orthostatic mediated.  Since last seen,   Current Outpatient Medications  Medication Sig Dispense Refill   Accu-Chek Softclix Lancets lancets 2 (two) times daily.     AIMOVIG 70 MG/ML SOAJ INJECT 70 MG INTO THE SKIN EVERY 30 DAYS. 1 mL 3   albuterol (VENTOLIN HFA) 108 (90 Base) MCG/ACT inhaler INHALE 2 PUFFS INTO THE LUNGS EVERY 4 HOURS AS NEEDED FOR SHORTNESS OF BREATH (Patient taking differently: Inhale 2 puffs into the lungs every 4 (four) hours as needed for wheezing or shortness of breath.) 8.5 g 2   amLODipine (NORVASC) 5 MG tablet Take 1 tablet (5 mg total) by mouth daily. 90 tablet 3   aspirin 81 MG chewable tablet Chew 81 mg by mouth in the morning.     atorvastatin (LIPITOR) 40 MG tablet Take 1 tablet (40 mg total) by mouth daily. 90 tablet 3   blood glucose meter kit and supplies KIT Dispense based on patient and insurance preference. Use up to four times daily as directed. 1 each 11   Blood Glucose Monitoring Suppl (ACCU-CHEK AVIVA PLUS) w/Device KIT Use as directed to monitor FSBS 2x daily. Dx: E11.9 1 kit 1   empagliflozin (JARDIANCE) 25 MG TABS tablet Take 1 tablet (25 mg total) by mouth daily before breakfast. 30 tablet 5   gabapentin (NEURONTIN) 300 MG capsule Take 1 capsule (300 mg total) by mouth 3 (three) times daily as needed (nerve pain). 90 capsule 3   HYDROcodone-acetaminophen (NORCO) 5-325 MG tablet Take 1-2 tablets by mouth 2 (two) times daily as needed. 20 tablet 0   meloxicam (MOBIC) 15 MG tablet Take 1 tablet (15 mg total) by mouth daily. 30 tablet 0   metFORMIN (GLUCOPHAGE) 1000 MG tablet TAKE 1 TABLET BY MOUTH 2 TIMES DAILY WITH A MEAL. (Patient taking differently: Take 1,000 mg by mouth 2  (two) times daily with a meal.) 180 tablet 3   nitroGLYCERIN (NITROSTAT) 0.4 MG SL tablet Place 1 tablet (0.4 mg total) under the tongue every 5 (five) minutes as needed. For chest pain (Patient taking differently: Place 0.4 mg under the tongue every 5 (five) minutes x 3 doses as needed for chest pain.) 20 tablet 1   omeprazole (PRILOSEC) 40 MG capsule TAKE 1 CAPSULE BY MOUTH DAILY. (Patient taking differently: Take 40 mg by mouth in the morning.) 90 capsule 3   senna-docusate (SENOKOT-S) 8.6-50 MG tablet Take 1 tablet by mouth daily as needed (constipation).     topiramate (TOPAMAX) 25 MG tablet TAKE 2 TABLETS BY MOUTH 2 TIMES DAILY. 120 tablet 5   No current facility-administered medications for this visit.   Facility-Administered Medications Ordered in Other Visits  Medication Dose Route Frequency Provider Last Rate Last Admin   lactated ringers infusion    Continuous PRN Chyrel Masson, CRNA   New Bag at 11/15/15 1545     Past Medical History:  Diagnosis Date   Arthritis    Asthma    Chronic bronchitis ([place])    Cocaine abuse ([place])    GERD (gastroesophageal reflux disease)    Headache    Hyperlipidemia    Hypertension    OSA treated with BiPAP    ahi-132, bipap 23/19  Syncope    Tobacco abuse     Past Surgical History:  Procedure Laterality Date   ESOPHAGOGASTRODUODENOSCOPY (EGD) WITH PROPOFOL N/A 12/11/2012   Procedure: ESOPHAGOGASTRODUODENOSCOPY (EGD) WITH PROPOFOL;  Surgeon: Arta Silence, MD;  [facility];  Service: Endoscopy;  Laterality: N/A;   FINGER AMPUTATION     KNEE ARTHROSCOPY Left 11/15/2015   Procedure: ARTHROSCOPY KNEE AND MEDIAL MENISECTOMY;  Surgeon: Paralee Cancel, MD;  [facility];  Service: Orthopedics;  Laterality: Left;   KNEE ARTHROSCOPY WITH LATERAL MENISECTOMY Right 07/14/2020   Procedure: RIGHT KNEE PARTIAL LATERAL MENISCECTOMY;  Surgeon: Leandrew Koyanagi, MD;  Location: [facility];  Service: Orthopedics;  Laterality: Right;    Social  History   Socioeconomic History   Marital status: Divorced    Spouse name: Not on file   Number of children: 0   Years of education: Not on file   Highest education level: Not on file  Occupational History   Not on file  Tobacco Use   Smoking status: Every Day    Packs/day: 0.50    Years: 40.00    Pack years: 20.00    Types: Cigarettes   Smokeless tobacco: Never   Tobacco comments:    06/05/17 1 pack per 1 1/2 days  Substance and Sexual Activity   Alcohol use: Yes    Comment: occasionally   Drug use: Yes    Types: Cocaine    Comment: patient states none- read lab report 09/02/2012/ 11-20-12 case cx.due to positive screen test-pt. advised no recreational drugs to be used   Sexual activity: Not on file  Other Topics Concern   Not on file  Social History Narrative   Lives with brother and his sister in [place].     Social Determinants of Health   Financial Resource Strain: Low Risk    Difficulty of Paying Living Expenses: Not very hard  Food Insecurity: Not on file  Transportation Needs: Not on file  Physical Activity: Not on file  Stress: Not on file  Social Connections: Not on file  Intimate Partner Violence: Not on file    Family History  Problem Relation Age of Onset   Hypertension Father    Diabetes Father    Diabetes Other        Multiple family members   Diabetes Sister    Diabetes Brother    Stroke Sister     ROS: no fevers or chills, productive cough, hemoptysis, dysphasia, odynophagia, melena, hematochezia, dysuria, hematuria, rash, seizure activity, orthopnea, PND, pedal edema, claudication. Remaining systems are negative.  Physical Exam: Well-developed well-nourished in no acute distress.  Skin is warm and dry.  HEENT is normal.  Neck is supple.  Chest is clear to auscultation with normal expansion.  Cardiovascular exam is regular rate and rhythm.  Abdominal exam nontender or distended. No masses palpated. Extremities show no edema. neuro grossly  intact  ECG- personally reviewed  A/P  1 syncope-previous episode question orthostatic mediated.  He has had no recurrences.  Note we did decrease his amlodipine from 10 to 5 mg daily to allow his blood pressure to run higher.  Can consider monitor in the future if he has more episodes.  2 hypertension-blood pressure controlled.  Continue present medications.  3 hyperlipidemia-continue statin.  4 tobacco abuse-patient counseled on discontinuing.  5 history of cocaine abuse-patient denies use since previous office visit.  Kirk Ruths, MD

## 2021-03-08 ENCOUNTER — Encounter: Payer: Self-pay | Admitting: Orthopaedic Surgery

## 2021-03-08 ENCOUNTER — Ambulatory Visit (INDEPENDENT_AMBULATORY_CARE_PROVIDER_SITE_OTHER): Payer: Medicare Other | Admitting: Orthopaedic Surgery

## 2021-03-08 ENCOUNTER — Encounter: Payer: Self-pay | Admitting: Neurology

## 2021-03-08 ENCOUNTER — Other Ambulatory Visit: Payer: Self-pay

## 2021-03-08 ENCOUNTER — Ambulatory Visit (INDEPENDENT_AMBULATORY_CARE_PROVIDER_SITE_OTHER): Payer: Medicare Other | Admitting: Neurology

## 2021-03-08 VITALS — BP 114/69 | HR 90 | Ht 66.0 in | Wt 281.6 lb

## 2021-03-08 VITALS — Ht 66.0 in | Wt 282.0 lb

## 2021-03-08 DIAGNOSIS — G4733 Obstructive sleep apnea (adult) (pediatric): Secondary | ICD-10-CM | POA: Diagnosis not present

## 2021-03-08 DIAGNOSIS — R634 Abnormal weight loss: Secondary | ICD-10-CM

## 2021-03-08 DIAGNOSIS — Z6841 Body Mass Index (BMI) 40.0 and over, adult: Secondary | ICD-10-CM

## 2021-03-08 DIAGNOSIS — M1711 Unilateral primary osteoarthritis, right knee: Secondary | ICD-10-CM | POA: Diagnosis not present

## 2021-03-08 MED ORDER — METHYLPREDNISOLONE ACETATE 40 MG/ML IJ SUSP
40.0000 mg | INTRAMUSCULAR | Status: AC | PRN
  Administered 2021-03-08: 40 mg via INTRA_ARTICULAR

## 2021-03-08 MED ORDER — LIDOCAINE HCL 1 % IJ SOLN
2.0000 mL | INTRAMUSCULAR | Status: AC | PRN
  Administered 2021-03-08: 2 mL

## 2021-03-08 MED ORDER — BUPIVACAINE HCL 0.5 % IJ SOLN
2.0000 mL | INTRAMUSCULAR | Status: AC | PRN
  Administered 2021-03-08: 2 mL via INTRA_ARTICULAR

## 2021-03-08 NOTE — Progress Notes (Signed)
Office Visit Note   Patient: Kenneth [name]           Date of Birth: 08-Jan-1961           MRN: [id number] Visit Date: 03/08/2021              Requested by: [name], MD [address] [facility] [place],   [id number] PCP: [name], MD   Assessment & Plan: Visit Diagnoses:  1. Primary osteoarthritis of right knee   2. Morbid obesity ([place])   3. Body mass index 45.0-49.9, adult [facility])     Plan: Kenneth [name] returns today for ongoing right knee pain.  He is status post right knee arthroscopy partial lateral meniscectomy.  He had mild chondromalacia on intraoperative findings.  Continues to have burning pain that is worse with standing and weightbearing.  Right knee exam is unchanged.  No joint effusion.  I explained to him that the arthritis is relatively mild and the morbid obesity is likely the culprit for why he has such severe pain.  I strongly encouraged him to focus on weight loss.  I explained that its not really medically beneficial to continue to inject the knee with cortisone.  Follow-Up Instructions: No follow-ups on file.   Orders:  No orders of the defined types were placed in this encounter.  No orders of the defined types were placed in this encounter.     Procedures: Large Joint Inj: R knee on 03/08/2021 2:19 PM Indications: pain Details: 22 G needle  Arthrogram: No  Medications: 40 mg methylPREDNISolone acetate 40 MG/ML; 2 mL lidocaine 1 %; 2 mL bupivacaine 0.5 % Consent was given by the patient. Patient was prepped and draped in the usual sterile fashion.      Clinical Data: No additional findings.   Subjective: Chief Complaint  Patient presents with   Right Knee - Pain, Follow-up    HPI  Review of Systems   Objective: Vital Signs: Ht 5\' 6"  (1.676 m)    Wt 282 lb (127.9 kg)    BMI 45.52 kg/m   Physical Exam  Ortho Exam  Specialty Comments:  No specialty comments available.  Imaging: No results found.   PMFS  History: Patient Active Problem List   Diagnosis Date Noted   Mixed diabetic hyperlipidemia associated with type 2 diabetes mellitus ([place]) 07/29/2020   Mild intermittent asthma without complication 19/14/7829   Essential hypertension 07/29/2020   GERD without esophagitis 07/29/2020   Acute lateral meniscus tear of right knee 07/07/2020   Type 2 diabetes mellitus without complication, without long-term current use of insulin ([place]) 02/25/2019   OSA treated with BiPAP    History of syncope 05/13/2014   Asthma    Dysphagia 10/23/2011   Obesity 10/23/2011   Past Medical History:  Diagnosis Date   Arthritis    Asthma    Chronic bronchitis (HCC)    Cocaine abuse ([facility]CC)    GERD (gastroesophageal reflux disease)    Headache    Hyperlipidemia    Hypertension    OSA treated with BiPAP    ahi-132, bipap 23/19   Syncope    Tobacco abuse     Family History  Problem Relation Age of Onset   Hypertension Father    Diabetes Father    Diabetes Sister    Stroke Sister    Diabetes Brother    Diabetes Other        Multiple family members   Sleep apnea Neg  Hx     Past Surgical History:  Procedure Laterality Date   ESOPHAGOGASTRODUODENOSCOPY (EGD) WITH PROPOFOL N/A 12/11/2012   Procedure: ESOPHAGOGASTRODUODENOSCOPY (EGD) WITH PROPOFOL;  Surgeon: Arta Silence, MD;  [facility];  Service: Endoscopy;  Laterality: N/A;   FINGER AMPUTATION     KNEE ARTHROSCOPY Left 11/15/2015   Procedure: ARTHROSCOPY KNEE AND MEDIAL MENISECTOMY;  Surgeon: Paralee Cancel, MD;  [facility];  Service: Orthopedics;  Laterality: Left;   KNEE ARTHROSCOPY WITH LATERAL MENISECTOMY Right 07/14/2020   Procedure: RIGHT KNEE PARTIAL LATERAL MENISCECTOMY;  Surgeon: Leandrew Koyanagi, MD;  Location: [place];  Service: Orthopedics;  Laterality: Right;   Social History   Occupational History   Not on file  Tobacco Use   Smoking status: Every Day    Packs/day: 0.50    Years: 40.00    Pack years: 20.00    Types:  Cigarettes   Smokeless tobacco: Never   Tobacco comments:    06/05/17 1 pack per 1 1/2 days  Substance and Sexual Activity   Alcohol use: Yes    Alcohol/week: 7.0 standard drinks    Types: 7 Cans of beer per week   Drug use: Yes    Types: Cocaine    Comment: patient states none- read lab report 09/02/2012/ 11-20-12 case cx.due to positive screen test-pt. advised no recreational drugs to be used   Sexual activity: Not on file

## 2021-03-08 NOTE — Progress Notes (Signed)
Subjective:    Patient ID: Kenneth [name] is a 60 y.o. male.  HPI    Interim history:   [name] is a 60 year old right-handed gentleman with an underlying medical history of hyperlipidemia, coronary artery disease, reflux disease, morbid obesity, arthritis, chronic bronchitis in the setting of smoking, obesity hypoventilation syndrome and syncopal event, who presents for follow-up consultation of his obstructive sleep apnea, on BiPAP therapy. The patient is unaccompanied today. I last saw him on 12/05/2016, at which time he was not fully compliant with his BiPAP.  He was bothered by significant arthritis pain.  He was encouraged to be compliant with his BiPAP.  He saw Ward Givens, NP, on 06/05/2017, at which time he was better with his BiPAP compliance.  He was advised to follow-up in 6 months.  He saw Ward Givens, NP on 12/12/2017, at which time he was intermittent with his BiPAP compliance.  He was encouraged to be fully compliant.  He saw Ward Givens, NP on 12/16/2018, at which time he was better with his BiPAP compliance but not fully optimal.  He noticed mask leaking.  He recently had received new supplies however.  He saw Ward Givens, NP on 12/16/2019, at which time he was very good with his overall usage of the machine but not consistently on it for more than 4 hours.  He was advised to be fully compliant.  Today, 03/08/2021: I reviewed his BiPAP compliance data from 02/04/2021 through 03/05/2021, which is a total of 30 days, during which time he used his machine 23 days, percent use days greater than 4 hours at 50%, indicating mildly suboptimal compliance, average usage for days on treatment of 4 hours and 49 minutes, residual AHI at goal at 0.8/h, leak acceptable with fluctuating numbers, 95th percentile of leak at 15.2 L/min, pressure of 21/17 centimeters.  He reports overall doing well when he uses the machine, he does not mind the pressure and declines a pressure reduction.  He  declines a prescription for secondary machine.  He reports that he sleeps at his friend's house sometimes but would not take the machine, he does not wish to have a secondary machine because he does not wish to take the machine with him at the time.  He is not opposed to exploring other treatment options and is wondering about inspire and his candidacy.  We talked about inspire today at length and he was also directed to the website.  He is advised that he will probably need to lose some weight and he is currently working on weight loss because of his arthritis and the probable need for a knee replacement on the right side.  He is agreeable to pursuing reevaluation with an updated sleep study as well.  The patient's allergies, current medications, family history, past medical history, past social history, past surgical history and problem list were reviewed and updated as appropriate.    Previously:   I saw him on 01/18/2016, at which time he was poorly compliant with BiPAP therapy with a percentage of 23%. He had recent left knee surgery under Dr. Alvan Dame on 11/15/2015. He was still smoking and was taking hydrocodone as needed.    He was seen in the interim by Ward Givens, NP, on 05/22/16, at which time his compliance was a little better in the lower 40s but he was again advised to be fully compliant with treatment.    I reviewed his BiPAP compliance data from 11/05/2016 through 12/04/2016 which is a total  of 30 days, during which time he used his BiPAP 21 days with percent used days greater than 4 hours at 40% only, indicating poor compliance with an average usage of 4 hours and 2 minutes for days on treatment, average AHI 1.3 per hour which is at goal, leak low with the 95th percentile at 1 L per minute, pressure of 29/19 cm.   I reviewed his BiPAP compliance data from 10/21/2016 through 11/19/2016, which is a total of 30 days, during which time he used his BiPAP only 21 days with percent used days  greater than 4 hours at only 30%, indicating poor compliance, average usage of 3 hours and 40 minutes 4 days on treatment, average AHI at goal at 1.4 per hour, leak on the low side with the 95th percentile at 2.2 L/m on a pressure of 23/19 cm. In the past 90 days his compliance percentage was similar at 36%.   I saw him on 05/10/2015, at which time he was not fully compliant with BiPAP therapy. He reported needing new supplies. He was still smoking. He had lost about 10 pounds in 12 months. I asked him to be fully compliant with BiPAP therapy.: He reports needing new supplies. He admits to not using his machine very much. Has no issues using it though. He sleeps better with it, he admits. He still smokes. He has lost a little bit of weight. Between February 2016 and February this year, he has lost about 10 pounds. We talked about his BiPAP titration study from 10/15/2014.   I reviewed his BiPAP compliance data from 12/18/2015 through 01/16/2016, which is a total of 30 days, during which time he used his BiPAP only 18 days with percent used days greater than 4 hours at 23%, indicating poor compliance with an average daily usage of only 2 hours and 15 minutes, residual AHI 1 per hour, leak low with the 95th percentile at 7.7 L/m on a pressure of 23/19 cm. In the past 90 days his compliance percentage was 24%.   I saw him on 07/30/2014, at which time we talked about his auto BiPAP therapy. He was compliant with treatment but I suggested we bring him back for a full night titration study to optimize treatment setting and determine a set pressure. He had a CPAP titration study on 08/04/2014, this was followed by a BiPAP titration study on 10/15/2014. I went over his test results with him in detail today. His CPAP titration study from 08/04/2014 showed that his sleep apnea was not appropriately treated with CPAP. He was then switched to BiPAP therapy but an optimal pressure was not determined during that study. CPAP  was titrated from 5 cm to 16 cm but his AHI was 20 per hour while on CPAP. BiPAP was started at 18/14 and titrated to 22/18 but he had ongoing respiratory events, a brief trial of BiPAP ST was not successful during that study. Sleep efficiency was 79.4%. Sleep latency 7 minutes, wake after sleep onset was 89 minutes with mild to moderate sleep fragmentation noted. Average oxygen saturation was 92%, nadir was 83%. Based on unsuccessful treatment with CPAP therapy and suboptimal treatment with BiPAP therapy during the study I asked him to return for another full night BiPAP titration study. He had this on 10/15/2014. Sleep efficiency was 83.8%, latency to sleep 6 minutes, wake after sleep onset was 63 minutes with moderate sleep fragmentation noted. He had an elevated arousal index. He had increased percentages of stage I and  stage II sleep, absence of slow-wave sleep and a REM percentage of 17.9% with a reduced REM latency of 24 minutes. He had mild PLMS with an index of 10.2 per hour, resulting in 3.9 arousals per hour. BiPAP was started at 8 over 4 cm and titrated to 23/19 cm. AHI was 0 per hour at that pressure. Supplemental oxygen was not utilized. On the final pressure, his desaturation nadir was 90%, supine REM sleep was achieved during the study but not on the final pressure. He indicated that he slept better than usual. Based on his test results are prescribed BiPAP therapy for home use with a setting of 29/19 cm via full facemask.   I reviewed his BiPAP compliance data from 04/05/2015 through 05/04/2015 which is a total of 30 days during which time he used his machine only 8 days with percent used days greater than 4 hours at 13% only, indicating poor compliance with an average usage of 1 hour and 17 minutes only. Residual AHI 3.1 per hour, leak low. Set pressure of 23/19 cm.   I first met him on 05/22/2014 at the request of his primary care physician, at which time the patient reported snoring and  daytime somnolence. I suggested he return for sleep study. He had a split-night sleep study on 05/25/2014 and went over his test results with him in detail today. Baseline sleep efficiency was 77.3% with a latency to sleep of 0 minutes and wake after sleep onset of 35.5 minutes with severe sleep fragmentation noted. He had an elevated arousal index. He had absence of slow-wave sleep and absence of REM sleep prior to CPAP. He had had rare pauses on his EKG, about 3 seconds long. He had moderate to loud snoring. His total AHI was 119 per hour. This line oxygen saturation was 89%, nadir was 76%. He was then titrated on CPAP from 5-15 cm and then placed on BiPAP therapy and titrated from 16/12 cm to 24/20 cm. He did not have resolution of his sleep disordered breathing while on CPAP or BiPAP standard. I suggested Auto BiPAP at home and also suggested he undergo pulse oximetry testing while on BiPAP therapy at home.    I reviewed his BiPAP compliance data from 06/28/2014 through 07/27/2014 which is a total of 30 days during which time he used his machine only 22 days. Percent used days greater than 4 hours was 70%, indicating borderline/adequate compliance. Average usage for all nights was 4 hours and 54 minutes. He is on BiPAP with maximum IPAP of 25 cm, minimum EPAP of 14 cm and pressure support of 4. Average AHI was 3.3 per hour, adequate. Leak was acceptable with the 95th percentile at 13.1 L/m. 95th percentile of pressure appears to be 21/17.   I spoke with his cardiologist on the phone on 05/19/14; the patient was having some 5-6 s pauses. His Holter monitor was placed earlier this month. On 04/30/2014 he had a syncopal spell. He was in the emergency room for this. He has seen Dr. Percival Spanish for cardiac workup. He has been advised to have a sleep study. He snores, he wakes up not refreshed and he has multiple nighttime awakenings and nocturia at least 4-5 times per night. His Epworth sleepiness score is 19 out of  24. He goes to bed around 9 PM and while he falls asleep fairly quickly he wakes up almost every hour he says. Sometimes he gets out of bed around 3 AM and never goes back to sleep.  He denies restless leg symptoms but has some aching in his legs. He has lower extremity swelling. He wakes up with a headache at times. He is not aware of any family history of obstructive sleep apnea. He has never had a sleep study. He has been overweight for years. He snores and this is at times quite loud. [name] not sure if he actually quits breathing in his sleep but has woken up with a sense of gasping for air. He drinks about 2 Coca-Cola as per day. He does not drink coffee. He occasionally drinks alcohol. He smokes one pack per day.   He lives with his brother and his sister-in-law. He is divorced. He does not have any children. There are no pets in his bed. Occasionally he watches TV in bed. He does not work.      His Past Medical History Is Significant For: Past Medical History:  Diagnosis Date   Arthritis    Asthma    Chronic bronchitis (HCC)    Cocaine abuse ([place])    GERD (gastroesophageal reflux disease)    Headache    Hyperlipidemia    Hypertension    OSA treated with BiPAP    ahi-132, bipap 23/19   Syncope    Tobacco abuse     His Past Surgical History Is Significant For: Past Surgical History:  Procedure Laterality Date   ESOPHAGOGASTRODUODENOSCOPY (EGD) WITH PROPOFOL N/A 12/11/2012   Procedure: ESOPHAGOGASTRODUODENOSCOPY (EGD) WITH PROPOFOL;  Surgeon: Arta Silence, MD;  Location: [facility];  Service: Endoscopy;  Laterality: N/A;   FINGER AMPUTATION     KNEE ARTHROSCOPY Left 11/15/2015   Procedure: ARTHROSCOPY KNEE AND MEDIAL MENISECTOMY;  Surgeon: Paralee Cancel, MD;  Location: [facility];  Service: Orthopedics;  Laterality: Left;   KNEE ARTHROSCOPY WITH LATERAL MENISECTOMY Right 07/14/2020   Procedure: RIGHT KNEE PARTIAL LATERAL MENISCECTOMY;  Surgeon: Leandrew Koyanagi, MD;  Location: [facility];   Service: Orthopedics;  Laterality: Right;    His Family History Is Significant For: Family History  Problem Relation Age of Onset   Hypertension Father    Diabetes Father    Diabetes Sister    Stroke Sister    Diabetes Brother    Diabetes Other        Multiple family members   Sleep apnea Neg Hx     His Social History Is Significant For: Social History   Socioeconomic History   Marital status: Divorced    Spouse name: Not on file   Number of children: 0   Years of education: Not on file   Highest education level: Not on file  Occupational History   Not on file  Tobacco Use   Smoking status: Every Day    Packs/day: 0.50    Years: 40.00    Pack years: 20.00    Types: Cigarettes   Smokeless tobacco: Never   Tobacco comments:    06/05/17 1 pack per 1 1/2 days  Substance and Sexual Activity   Alcohol use: Yes    Alcohol/week: 7.0 standard drinks    Types: 7 Cans of beer per week   Drug use: Yes    Types: Cocaine    Comment: patient states none- read lab report 09/02/2012/ 11-20-12 case cx.due to positive screen test-pt. advised no recreational drugs to be used   Sexual activity: Not on file  Other Topics Concern   Not on file  Social History Narrative   Lives with brother and his sister in [place].  Social Determinants of Health   Financial Resource Strain: Low Risk    Difficulty of Paying Living Expenses: Not very hard  Food Insecurity: Not on file  Transportation Needs: Not on file  Physical Activity: Not on file  Stress: Not on file  Social Connections: Not on file    His Allergies Are:  No Known Allergies:   His Current Medications Are:  Outpatient Encounter Medications as of 03/08/2021  Medication Sig   Accu-Chek Softclix Lancets lancets 2 (two) times daily.   AIMOVIG 70 MG/ML SOAJ INJECT 70 MG INTO THE SKIN EVERY 30 DAYS.   albuterol (VENTOLIN HFA) 108 (90 Base) MCG/ACT inhaler INHALE 2 PUFFS INTO THE LUNGS EVERY 4 HOURS AS NEEDED FOR SHORTNESS OF  BREATH (Patient taking differently: Inhale 2 puffs into the lungs every 4 (four) hours as needed for wheezing or shortness of breath.)   amLODipine (NORVASC) 5 MG tablet Take 1 tablet (5 mg total) by mouth daily.   aspirin 81 MG chewable tablet Chew 81 mg by mouth in the morning.   atorvastatin (LIPITOR) 40 MG tablet Take 1 tablet (40 mg total) by mouth daily.   blood glucose meter kit and supplies KIT Dispense based on patient and insurance preference. Use up to four times daily as directed.   Blood Glucose Monitoring Suppl (ACCU-CHEK AVIVA PLUS) w/Device KIT Use as directed to monitor FSBS 2x daily. Dx: E11.9   empagliflozin (JARDIANCE) 25 MG TABS tablet Take 1 tablet (25 mg total) by mouth daily before breakfast.   gabapentin (NEURONTIN) 300 MG capsule Take 1 capsule (300 mg total) by mouth 3 (three) times daily as needed (nerve pain).   HYDROcodone-acetaminophen (NORCO) 5-325 MG tablet Take 1-2 tablets by mouth 2 (two) times daily as needed.   meloxicam (MOBIC) 15 MG tablet Take 1 tablet (15 mg total) by mouth daily.   metFORMIN (GLUCOPHAGE) 1000 MG tablet TAKE 1 TABLET BY MOUTH 2 TIMES DAILY WITH A MEAL. (Patient taking differently: Take 1,000 mg by mouth 2 (two) times daily with a meal.)   nitroGLYCERIN (NITROSTAT) 0.4 MG SL tablet Place 1 tablet (0.4 mg total) under the tongue every 5 (five) minutes as needed. For chest pain (Patient taking differently: Place 0.4 mg under the tongue every 5 (five) minutes x 3 doses as needed for chest pain.)   omeprazole (PRILOSEC) 40 MG capsule TAKE 1 CAPSULE BY MOUTH DAILY. (Patient taking differently: Take 40 mg by mouth in the morning.)   senna-docusate (SENOKOT-S) 8.6-50 MG tablet Take 1 tablet by mouth daily as needed (constipation).   topiramate (TOPAMAX) 25 MG tablet TAKE 2 TABLETS BY MOUTH 2 TIMES DAILY.   Facility-Administered Encounter Medications as of 03/08/2021  Medication   lactated ringers infusion  :  Review of Systems:  Out of a  complete 14 point review of systems, all are reviewed and negative with the exception of these symptoms as listed below: Review of Systems  Neurological:        Pt is here for follow up for sleep study. Pt states he sleeps at other houses at night and its hard to carry CPAP machine around. Pt states he wants to see if he is a candidate for inspire .     Objective:  Neurological Exam  Physical Exam Physical Examination:   Vitals:   03/08/21 0844  BP: 114/69  Pulse: 90    General Examination: The patient is a very pleasant 60 y.o. male in no acute distress. He appears well-developed and well-nourished and  well groomed.   HEENT: Normocephalic, atraumatic, pupils are equal, round and reactive to light and accommodation. Extraocular tracking is good without limitation to gaze excursion or nystagmus noted. Normal smooth pursuit is noted. Hearing is grossly intact. Face is symmetric with normal facial animation, speech is clear with no dysarthria noted. There is no hypophonia. There is no lip, neck/head, jaw or voice tremor. Neck is supple with full range of passive and active motion. There are no carotid bruits on auscultation. Oropharynx exam reveals: mild to moderate mouth dryness, adequate dental hygiene and marked airway crowding. Mallampati is class III. Tongue protrudes centrally and palate elevates symmetrically.     Chest: Clear to auscultation without wheezing, rhonchi or crackles noted.   Heart: S1+S2+0, regular and normal without murmurs, rubs or gallops noted.    Abdomen: Soft, non-tender and non-distended.   Extremities: There is no pitting edema in the distal lower extremities bilaterally.    Skin: Dry appearing.   Musculoskeletal: exam reveals no obvious joint deformities, with the exception of pain and decreased range of motion of his R knee.    Neurologically:  Mental status: The patient is awake, alert and oriented in all 4 spheres. His immediate and remote memory,  attention, language skills and fund of knowledge are appropriate. There is no evidence of aphasia, agnosia, apraxia or anomia. Speech is clear with normal prosody and enunciation. Thought process is linear. Mood is normal and affect is normal.  Cranial nerves II - XII are as described above under HEENT exam.  Motor exam: Normal bulk, strength and tone is noted. There is no tremor. Fine motor skills and coordination: grossly intact. Cerebellar testing: No dysmetria or intention tremor. There is no truncal or gait ataxia.  Sensory exam: intact to light touch in the upper and lower extremities.  Gait, station and balance: He stands without difficulty, and walks slowly, no cane.              Assessment and Plan:   In summary, ARLIN SASS is a very pleasant 60 year old male with an underlying complex medical history of hyperlipidemia, coronary artery disease, reflux disease, morbid obesity, arthritis, chronic bronchitis in the setting of smoking, obesity hypoventilation syndrome, syncope (seen by cardiology), who presents for follow-up consultation of his severe obstructive sleep apnea with evidence of significant nocturnal desaturations and nocturnal hypoxemia, in the context of ongoing smoking, and morbid obesity. He was compliant in the beginning of his treatment in 2016, but has had suboptimal compliance.  We talked about alternative treatment options again today.  He declines blood pressure change because he feels that he tolerates his pressure well.  He declines a prescription for secondary machine so he can travel with it.  He is working on weight loss and has in fact lost a little over 40 pounds since 2016.  We talked about inspire today and possible candidacy in the near future.  He was advised to look up details online and we can consider a referral to ENT in the future.  He is currently working on further weight loss to be able to pursue knee replacement surgery.  He has an appointment with  orthopedics today.  He is agreeable to pursuing reevaluation with an updated sleep study.  He is encouraged to be consistent with his current BiPAP machine. He had additional sleep testing in May 2016 as well as July 2016. His original split-night sleep study from 05/25/2014 showed an AHI of 119 per hour (supine AHI was 132/hour),  absence of REM sleep and O2 nadir of 76%. When he is able to use his BiPAP he still endorses better sleep quality and better daytime energy.  We will keep him posted as to his sleep study results by phone call.  He is advised to make a follow-up appointment routinely to see one of our nurse practitioners in 1 year.  If he starts treatment with a new BiPAP machine he will need follow-up within 60 to 90 days of starting treatment with new equipment.  We will also consider referral to ENT in the future.  I answered all his questions today and he was in agreement with her plan.   I spent 30 minutes in total face-to-face time and in reviewing records during pre-charting, more than 50% of which was spent in counseling and coordination of care, reviewing test results, reviewing medications and treatment regimen and/or in discussing or reviewing the diagnosis of OSA, the prognosis and treatment options. Pertinent laboratory and imaging test results that were available during this visit with the patient were reviewed by me and considered in my medical decision making (see chart for details).

## 2021-03-08 NOTE — Patient Instructions (Addendum)
It was nice to see you again today.  As discussed, your sleep apnea is under very good control when you use your BiPAP consistently.  Please try to be mindful of the severity of your sleep apnea.  You have lost weight and we can certainly reevaluate your sleep apnea with a sleep study at this time, I would like for you to continue to work on weight loss.  We will call you to schedule your sleep study at your convenience.  It has been nearly 7 years since we tested you. Compared to 2016, you have lost over 40 pounds.  As far as your question regarding inspire, the criteria are quite strict depending on the insurance plan: For Medicare, the body mass index, or BMI should be less than 35, for other commercial insurance plans the BMI should be less than 32.  Apnea hypopnea score should be between 15 and 65/h, your sleep study in 2016 indicated an AHI of 132/h.  Nevertheless, it is possible that your sleep apnea has improved due to your weight loss and I think it is worth our while to get you checked again.  Please follow-up to see Kenneth Givens, NP in about 1 year, we will also call you about your sleep study results.

## 2021-03-11 ENCOUNTER — Ambulatory Visit: Payer: Medicare Other | Admitting: Cardiology

## 2021-03-17 ENCOUNTER — Ambulatory Visit: Payer: Medicare Other

## 2021-03-24 ENCOUNTER — Other Ambulatory Visit: Payer: Self-pay

## 2021-03-24 ENCOUNTER — Ambulatory Visit (INDEPENDENT_AMBULATORY_CARE_PROVIDER_SITE_OTHER): Payer: Medicare Other

## 2021-03-24 VITALS — Ht 66.0 in | Wt 282.0 lb

## 2021-03-24 DIAGNOSIS — Z0000 Encounter for general adult medical examination without abnormal findings: Secondary | ICD-10-CM

## 2021-03-24 NOTE — Progress Notes (Signed)
Subjective:   Kenneth [name] is a 60 y.o. male who presents for an Initial Medicare Annual Wellness Visit. Virtual Visit via Telephone Note  I connected with  Kenneth [name] on 03/24/21 at  2:45 PM EST by telephone and verified that I am speaking with the correct person using two identifiers.  Location: Patient: Home Provider: [facility] Persons participating in the virtual visit: patient/Nurse Health Advisor   I discussed the limitations, risks, security and privacy concerns of performing an evaluation and management service by telephone and the availability of in person appointments. The patient expressed understanding and agreed to proceed.  Interactive audio and video telecommunications were attempted between this nurse and patient, however failed, due to patient having technical difficulties OR patient did not have access to video capability.  We continued and completed visit with audio only.  Some vital signs may be absent or patient reported.   Chriss Driver, LPN  Review of Systems     Cardiac Risk Factors include: advanced age (>106mn, >>49women);hypertension;diabetes mellitus;dyslipidemia;male gender;sedentary lifestyle;obesity (BMI >30kg/m2)    PHONE VISIT. PT AT HOME. NURSE AT [facility]. Objective:    Today's Vitals   03/24/21 1443  Weight: 282 lb (127.9 kg)  Height: _0  (1.676 m)   Body mass index is 45.52 kg/m.  Advanced Directives 03/24/2021 07/30/2020 07/14/2020 05/08/2020 02/17/2020 11/10/2015 07/20/2014  Does Patient Have a Medical Advance Directive? _1  Yes No  Type of Advance Directive - - - - - HPress photographerLiving will -  Copy of [place] Chart? - - - - - No - copy requested -  Would patient like information on creating a medical advance directive? No - Patient declined No - Patient declined No - Patient declined No - Patient declined No - Patient declined - -  Pre-existing out of facility DNR order (yellow form or  pink MOST form) - - - - - - -    Current Medications (verified) Outpatient Encounter Medications as of 03/24/2021  Medication Sig   Accu-Chek Softclix Lancets lancets 2 (two) times daily.   AIMOVIG 70 MG/ML SOAJ INJECT 70 MG INTO THE SKIN EVERY 30 DAYS.   albuterol (VENTOLIN HFA) 108 (90 Base) MCG/ACT inhaler INHALE 2 PUFFS INTO THE LUNGS EVERY 4 HOURS AS NEEDED FOR SHORTNESS OF BREATH (Patient taking differently: Inhale 2 puffs into the lungs every 4 (four) hours as needed for wheezing or shortness of breath.)   amLODipine (NORVASC) 5 MG tablet Take 1 tablet (5 mg total) by mouth daily.   aspirin 81 MG chewable tablet Chew 81 mg by mouth in the morning.   atorvastatin (LIPITOR) 40 MG tablet Take 1 tablet (40 mg total) by mouth daily.   blood glucose meter kit and supplies KIT Dispense based on patient and insurance preference. Use up to four times daily as directed.   Blood Glucose Monitoring Suppl (ACCU-CHEK AVIVA PLUS) w/Device KIT Use as directed to monitor FSBS 2x daily. Dx: E11.9   empagliflozin (JARDIANCE) 25 MG TABS tablet Take 1 tablet (25 mg total) by mouth daily before breakfast.   gabapentin (NEURONTIN) 300 MG capsule Take 1 capsule (300 mg total) by mouth 3 (three) times daily as needed (nerve pain).   HYDROcodone-acetaminophen (NORCO) 5-325 MG tablet Take 1-2 tablets by mouth 2 (two) times daily as needed.   meloxicam (MOBIC) 15 MG tablet Take 1 tablet (15 mg total) by mouth daily.   metFORMIN (GLUCOPHAGE) 1000 MG tablet  TAKE 1 TABLET BY MOUTH 2 TIMES DAILY WITH A MEAL. (Patient taking differently: Take 1,000 mg by mouth 2 (two) times daily with a meal.)   nitroGLYCERIN (NITROSTAT) 0.4 MG SL tablet Place 1 tablet (0.4 mg total) under the tongue every 5 (five) minutes as needed. For chest pain (Patient taking differently: Place 0.4 mg under the tongue every 5 (five) minutes x 3 doses as needed for chest pain.)   omeprazole (PRILOSEC) 40 MG capsule TAKE 1 CAPSULE BY MOUTH DAILY.  (Patient taking differently: Take 40 mg by mouth in the morning.)   senna-docusate (SENOKOT-S) 8.6-50 MG tablet Take 1 tablet by mouth daily as needed (constipation).   topiramate (TOPAMAX) 25 MG tablet TAKE 2 TABLETS BY MOUTH 2 TIMES DAILY.   Facility-Administered Encounter Medications as of 03/24/2021  Medication   lactated ringers infusion    Allergies (verified) Patient has no known allergies.   History: Past Medical History:  Diagnosis Date   Arthritis    Asthma    Chronic bronchitis (HCC)    Cocaine abuse ([place])    GERD (gastroesophageal reflux disease)    Headache    Hyperlipidemia    Hypertension    OSA treated with BiPAP    ahi-132, bipap 23/19   Syncope    Tobacco abuse    Past Surgical History:  Procedure Laterality Date   ESOPHAGOGASTRODUODENOSCOPY (EGD) WITH PROPOFOL N/A 12/11/2012   Procedure: ESOPHAGOGASTRODUODENOSCOPY (EGD) WITH PROPOFOL;  Surgeon: Arta Silence, MD;  Location: [facility];  Service: Endoscopy;  Laterality: N/A;   FINGER AMPUTATION     KNEE ARTHROSCOPY Left 11/15/2015   Procedure: ARTHROSCOPY KNEE AND MEDIAL MENISECTOMY;  Surgeon: Paralee Cancel, MD;  Location: [facility];  Service: Orthopedics;  Laterality: Left;   KNEE ARTHROSCOPY WITH LATERAL MENISECTOMY Right 07/14/2020   Procedure: RIGHT KNEE PARTIAL LATERAL MENISCECTOMY;  Surgeon: Leandrew Koyanagi, MD;  Location: [place];  Service: Orthopedics;  Laterality: Right;   Family History  Problem Relation Age of Onset   Hypertension Father    Diabetes Father    Diabetes Sister    Stroke Sister    Diabetes Brother    Diabetes Other        Multiple family members   Sleep apnea Neg Hx    Social History   Socioeconomic History   Marital status: Divorced    Spouse name: Not on file   Number of children: 0   Years of education: Not on file   Highest education level: Not on file  Occupational History   Not on file  Tobacco Use   Smoking status: Every Day    Packs/day: 0.50    Years: 40.00     Pack years: 20.00    Types: Cigarettes   Smokeless tobacco: Never   Tobacco comments:    06/05/17 1 pack per 1 1/2 days  Substance and Sexual Activity   Alcohol use: Yes    Alcohol/week: 7.0 standard drinks    Types: 7 Cans of beer per week   Drug use: Yes    Types: Cocaine    Comment: patient states none- read lab report 09/02/2012/ 11-20-12 case cx.due to positive screen test-pt. advised no recreational drugs to be used   Sexual activity: Not on file  Other Topics Concern   Not on file  Social History Narrative   Lives with brother and his sister in [place].     Social Determinants of Health   Financial Resource Strain: Low Risk    Difficulty of Paying  Living Expenses: Not hard at all  Food Insecurity: No Food Insecurity   Worried About [place] in the Last Year: Never true   Ran Out of Food in the Last Year: Never true  Transportation Needs: No Transportation Needs   Lack of Transportation (Medical): No   Lack of Transportation (Non-Medical): No  Physical Activity: Insufficiently Active   Days of Exercise per Week: 3 days   Minutes of Exercise per Session: 20 min  Stress: No Stress Concern Present   Feeling of Stress : Not at all  Social Connections: Socially Isolated   Frequency of Communication with Friends and Family: More than three times a week   Frequency of Social Gatherings with Friends and Family: More than three times a week   Attends Religious Services: Never   Marine scientist or Organizations: No   Attends Archivist Meetings: Never   Marital Status: Divorced    Tobacco Counseling Ready to quit: Not Answered Counseling given: Not Answered Tobacco comments: 06/05/17 1 pack per 1 1/2 days   Clinical Intake:  Pre-visit preparation completed: Yes  Pain : No/denies pain     BMI - recorded: 45.52 Nutritional Status: BMI > 30  Obese Nutritional Risks: None Diabetes: Yes  How often do you need to have someone help you when you  read instructions, pamphlets, or other written materials from your doctor or pharmacy?: 1 - Never  Diabetic?Nutrition Risk Assessment:  Has the patient had any N/V/D within the last 2 months?  No  Does the patient have any non-healing wounds?  No  Has the patient had any unintentional weight loss or weight gain?  No   Diabetes:  Is the patient diabetic?  Yes  If diabetic, was a CBG obtained today?  No  Did the patient bring in their glucometer from home?  No  Phone visit How often do you monitor your CBG's? BID.   Financial Strains and Diabetes Management:  Are you having any financial strains with the device, your supplies or your medication? No .  Does the patient want to be seen by Chronic Care Management for management of their diabetes?  No  Would the patient like to be referred to a Nutritionist or for Diabetic Management?  No   Diabetic Exams:  Diabetic Eye Exam: Completed 03/29/2020.  Pt has been advised about the importance in completing this exam.  Diabetic Foot Exam: Completed 06/12/2016. Pt has been advised about the importance in completing this exam.   Interpreter Needed?: No  Information entered by :: MJ Keiandra Sullenger, LPN   Activities of Daily Living In your present state of health, do you have any difficulty performing the following activities: 03/24/2021 07/30/2020  Hearing? N N  Vision? N N  Difficulty concentrating or making decisions? N N  Walking or climbing stairs? N Y  Dressing or bathing? N N  Doing errands, shopping? N N  Preparing Food and eating ? N -  Using the Toilet? N -  In the past six months, have you accidently leaked urine? N -  Do you have problems with loss of bowel control? N -  Managing your Medications? N -  Managing your Finances? N -  Housekeeping or managing your Housekeeping? N -  Some recent data might be hidden    Patient Care Team: [name], MD as PCP - General (Family Medicine) Stanford Breed Denice Bors, MD as PCP - Cardiology  (Cardiology) Edythe Clarity, [facility] as Pharmacist (Pharmacist)  Indicate any recent Medical Services you may have received from other than [facility] providers in the past year (date may be approximate).     Assessment:   This is a routine wellness examination for [place].  Hearing/Vision screen Hearing Screening - Comments:: No hearing issues.  Vision Screening - Comments:: No vision issues. 2022. [facility].  Dietary issues and exercise activities discussed: Current Exercise Habits: Home exercise routine, Type of exercise: walking, Time (Minutes): 20, Frequency (Times/Week): 3, Weekly Exercise (Minutes/Week): 60, Intensity: Mild, Exercise limited by: cardiac condition(s);orthopedic condition(s)   Goals Addressed             This Visit's Progress    Exercise 3x per week (30 min per time)       Continue to exercise and lose weight     Monitor and Manage My Blood Sugar-Diabetes Type 2   On track    Timeframe:  Long-Range Goal Priority:  High Start Date: 08/13/20                            Expected End Date: 02/13/21                      Follow Up Date 11/24/20   - check blood sugar at prescribed times - check blood sugar before and after exercise - enter blood sugar readings and medication or insulin into daily log - take the blood sugar log to all doctor visits    Why is this important?   Checking your blood sugar at home helps to keep it from getting very high or very low.  Writing the results in a diary or log helps the doctor know how to care for you.  Your blood sugar log should have the time, date and the results.  Also, write down the amount of insulin or other medicine that you take.  Other information, like what you ate, exercise done and how you were feeling, will also be helpful.     Notes:   01/20/21 - Patient started on Jardiance prior to this visit.       Depression Screen PHQ 2/9 Scores 03/24/2021 12/24/2020 11/03/2019 05/22/2017 11/14/2016  PHQ - 2  Score 0 0 0 0 0    Fall Risk Fall Risk  03/24/2021 12/24/2020 06/05/2017  Falls in the past year? 0 0 No  Number falls in past yr: 0 0 -  Injury with Fall? 0 0 -  Risk for fall due to : No Fall Risks No Fall Risks -  Follow up Falls prevention discussed Falls evaluation completed -    FALL RISK PREVENTION PERTAINING TO THE HOME:  Any stairs in or around the home? Yes  If so, are there any without handrails? No  Home free of loose throw rugs in walkways, pet beds, electrical cords, etc? Yes  Adequate lighting in your home to reduce risk of falls? Yes   ASSISTIVE DEVICES UTILIZED TO PREVENT FALLS:  Life alert? No  Use of a cane, walker or w/c? No  Grab bars in the bathroom? No  Shower chair or bench in shower? No  Elevated toilet seat or a handicapped toilet? No   TIMED UP AND GO:  Was the test performed? No .  Phone visit.  Cognitive Function:     6CIT Screen 03/24/2021  What Year? 0 points  What month? 0 points  What time? 0 points  Count back from 20 0  points  Months in reverse 4 points  Repeat phrase 0 points  Total Score 4    Immunizations Immunization History  Administered Date(s) Administered   Influenza,inj,Quad PF,6+ Mos 12/25/2018, 01/26/2020, 12/21/2020   Moderna Sars-Covid-2 Vaccination 05/25/2019, 06/28/2019   Pneumococcal Polysaccharide-23 08/25/2016   Tdap 08/25/2016    TDAP status: Up to date  Flu Vaccine status: Up to date  Pneumococcal vaccine status: Due, Education has been provided regarding the importance of this vaccine. Advised may receive this vaccine at local pharmacy or Health Dept. Aware to provide a copy of the vaccination record if obtained from local pharmacy or Health Dept. Verbalized acceptance and understanding.  Covid-19 vaccine status: Completed vaccines  Qualifies for Shingles Vaccine? Yes   Zostavax completed No   Shingrix Completed?: No.    Education has been provided regarding the importance of this vaccine. Patient has  been advised to call insurance company to determine out of pocket expense if they have not yet received this vaccine. Advised may also receive vaccine at local pharmacy or Health Dept. Verbalized acceptance and understanding.  Screening Tests Health Maintenance  Topic Date Due   Hepatitis C Screening  Never done   Zoster Vaccines- Shingrix (1 of 2) Never done   FOOT EXAM  06/12/2017   Pneumococcal Vaccine 78-63 Years old (2 - PCV) 08/25/2017   COVID-19 Vaccine (3 - Booster for Moderna series) 08/23/2019   URINE MICROALBUMIN  12/17/2020   OPHTHALMOLOGY EXAM  03/29/2021   HEMOGLOBIN A1C  06/20/2021   COLONOSCOPY (Pts 45-63yrs Insurance coverage will need to be confirmed)  03/27/2026   TETANUS/TDAP  08/26/2026   INFLUENZA VACCINE  Completed   HIV Screening  Completed   HPV VACCINES  Aged Out    Health Maintenance  Health Maintenance Due  Topic Date Due   Hepatitis C Screening  Never done   Zoster Vaccines- Shingrix (1 of 2) Never done   FOOT EXAM  06/12/2017   Pneumococcal Vaccine 78-38 Years old (2 - PCV) 08/25/2017   COVID-19 Vaccine (3 - Booster for Moderna series) 08/23/2019   URINE MICROALBUMIN  12/17/2020    Colorectal cancer screening: Type of screening: Colonoscopy. Completed 04/24/2016. Repeat every 10 years  Lung Cancer Screening: (Low Dose CT Chest recommended if Age 47-80 years, 30 pack-year currently smoking OR have quit w/in 15years.) does not qualify.    Additional Screening:  Hepatitis C Screening: does qualify; Completed Due  Vision Screening: Recommended annual ophthalmology exams for early detection of glaucoma and other disorders of the eye. Is the patient up to date with their annual eye exam?  Yes  Who is the provider or what is the name of the office in which the patient attends annual eye exams? Dr. Herbert Deaner If pt is not established with a provider, would they like to be referred to a provider to establish care? No .   Dental Screening: Recommended  annual dental exams for proper oral hygiene  Community Resource Referral / Chronic Care Management: CRR required this visit?  No   CCM required this visit?  No      Plan:     I have personally reviewed and noted the following in the patients chart:   Medical and social history Use of alcohol, tobacco or illicit drugs  Current medications and supplements including opioid prescriptions. Patient is not currently taking opioid prescriptions. Functional ability and status Nutritional status Physical activity Advanced directives List of other physicians Hospitalizations, surgeries, and ER visits in previous 12 months Vitals  Screenings to include cognitive, depression, and falls Referrals and appointments  In addition, I have reviewed and discussed with patient certain preventive protocols, quality metrics, and best practice recommendations. A written personalized care plan for preventive services as well as general preventive health recommendations were provided to patient.     Chriss Driver, LPN   [id number]/05/[id number]   Nurse Notes: Pt is up to date on health maintenance except for diabetic foot exam. Discussed shingrix, 2nd pnumococcal vaccines and how to obtain.

## 2021-03-24 NOTE — Patient Instructions (Addendum)
[name] , Thank you for taking time to come for your Medicare Wellness Visit. I appreciate your ongoing commitment to your health goals. Please review the following plan we discussed and let me know if I can assist you in the future.   Screening recommendations/referrals: Colonoscopy: Done 04/24/2016 Repeat in 10 years  Recommended yearly ophthalmology/optometry visit for glaucoma screening and checkup Recommended yearly dental visit for hygiene and checkup  Vaccinations: Influenza vaccine: Done 12/21/2020 Repeat annually  Pneumococcal vaccine: Done 08/25/2016. Second dose due. Tdap vaccine: Done 08/25/2016 Repeat in 10 years  Shingles vaccine: Shingrix discussed. Please contact your pharmacy for coverage information.     Covid-19: Done 05/25/2019 and 06/28/2019  Advanced directives: Advance directive discussed with you today. Even though you declined this today, please call our office should you change your mind, and we can give you the proper paperwork for you to fill out.   Conditions/risks identified: Aim for 30 minutes of exercise or brisk walking each day, drink 6-8 glasses of water and eat lots of fruits and vegetables.   Next appointment: Follow up in one year for your annual wellness visit 03/30/22 @ 2:45 pm.   Preventive Care 40-64 Years, Male Preventive care refers to lifestyle choices and visits with your health care provider that can promote health and wellness. What does preventive care include? A yearly physical exam. This is also called an annual well check. Dental exams once or twice a year. Routine eye exams. Ask your health care provider how often you should have your eyes checked. Personal lifestyle choices, including: Daily care of your teeth and gums. Regular physical activity. Eating a healthy diet. Avoiding tobacco and drug use. Limiting alcohol use. Practicing safe sex. Taking low-dose aspirin every day starting at age 33. What happens during an annual well  check? The services and screenings done by your health care provider during your annual well check will depend on your age, overall health, lifestyle risk factors, and family history of disease. Counseling  Your health care provider may ask you questions about your: Alcohol use. Tobacco use. Drug use. Emotional well-being. Home and relationship well-being. Sexual activity. Eating habits. Work and work Statistician. Screening  You may have the following tests or measurements: Height, weight, and BMI. Blood pressure. Lipid and cholesterol levels. These may be checked every 5 years, or more frequently if you are over 40 years old. Skin check. Lung cancer screening. You may have this screening every year starting at age 5 if you have a 30-pack-year history of smoking and currently smoke or have quit within the past 15 years. Fecal occult blood test (FOBT) of the stool. You may have this test every year starting at age 55. Flexible sigmoidoscopy or colonoscopy. You may have a sigmoidoscopy every 5 years or a colonoscopy every 10 years starting at age 75. Prostate cancer screening. Recommendations will vary depending on your family history and other risks. Hepatitis C blood test. Hepatitis B blood test. Sexually transmitted disease (STD) testing. Diabetes screening. This is done by checking your blood sugar (glucose) after you have not eaten for a while (fasting). You may have this done every 1-3 years. Discuss your test results, treatment options, and if necessary, the need for more tests with your health care provider. Vaccines  Your health care provider may recommend certain vaccines, such as: Influenza vaccine. This is recommended every year. Tetanus, diphtheria, and acellular pertussis (Tdap, Td) vaccine. You may need a Td booster every 10 years. Zoster vaccine. You may  need this after age 23. Pneumococcal 13-valent conjugate (PCV13) vaccine. You may need this if you have certain  conditions and have not been vaccinated. Pneumococcal polysaccharide (PPSV23) vaccine. You may need one or two doses if you smoke cigarettes or if you have certain conditions. Talk to your health care provider about which screenings and vaccines you need and how often you need them. This information is not intended to replace advice given to you by your health care provider. Make sure you discuss any questions you have with your health care provider. Document Released: 04/09/2015 Document Revised: 12/01/2015 Document Reviewed: 01/12/2015 Elsevier Interactive Patient Education  2017 [place] Prevention in the Home Falls can cause injuries. They can happen to people of all ages. There are many things you can do to make your home safe and to help prevent falls. What can I do on the outside of my home? Regularly fix the edges of walkways and driveways and fix any cracks. Remove anything that might make you trip as you walk through a door, such as a raised step or threshold. Trim any bushes or trees on the path to your home. Use bright outdoor lighting. Clear any walking paths of anything that might make someone trip, such as rocks or tools. Regularly check to see if handrails are loose or broken. Make sure that both sides of any steps have handrails. Any raised decks and porches should have guardrails on the edges. Have any leaves, snow, or ice cleared regularly. Use sand or salt on walking paths during winter. Clean up any spills in your garage right away. This includes oil or grease spills. What can I do in the bathroom? Use night lights. Install grab bars by the toilet and in the tub and shower. Do not use towel bars as grab bars. Use non-skid mats or decals in the tub or shower. If you need to sit down in the shower, use a plastic, non-slip stool. Keep the floor dry. Clean up any water that spills on the floor as soon as it happens. Remove soap buildup in the tub or shower  regularly. Attach bath mats securely with double-sided non-slip rug tape. Do not have throw rugs and other things on the floor that can make you trip. What can I do in the bedroom? Use night lights. Make sure that you have a light by your bed that is easy to reach. Do not use any sheets or blankets that are too big for your bed. They should not hang down onto the floor. Have a firm chair that has side arms. You can use this for support while you get dressed. Do not have throw rugs and other things on the floor that can make you trip. What can I do in the kitchen? Clean up any spills right away. Avoid walking on wet floors. Keep items that you use a lot in easy-to-reach places. If you need to reach something above you, use a strong step stool that has a grab bar. Keep electrical cords out of the way. Do not use floor polish or wax that makes floors slippery. If you must use wax, use non-skid floor wax. Do not have throw rugs and other things on the floor that can make you trip. What can I do with my stairs? Do not leave any items on the stairs. Make sure that there are handrails on both sides of the stairs and use them. Fix handrails that are broken or loose. Make sure that handrails  are as long as the stairways. Check any carpeting to make sure that it is firmly attached to the stairs. Fix any carpet that is loose or worn. Avoid having throw rugs at the top or bottom of the stairs. If you do have throw rugs, attach them to the floor with carpet tape. Make sure that you have a light switch at the top of the stairs and the bottom of the stairs. If you do not have them, ask someone to add them for you. What else can I do to help prevent falls? Wear shoes that: Do not have high heels. Have rubber bottoms. Are comfortable and fit you well. Are closed at the toe. Do not wear sandals. If you use a stepladder: Make sure that it is fully opened. Do not climb a closed stepladder. Make sure that  both sides of the stepladder are locked into place. Ask someone to hold it for you, if possible. Clearly mark and make sure that you can see: Any grab bars or handrails. First and last steps. Where the edge of each step is. Use tools that help you move around (mobility aids) if they are needed. These include: Canes. Walkers. Scooters. Crutches. Turn on the lights when you go into a dark area. Replace any light bulbs as soon as they burn out. Set up your furniture so you have a clear path. Avoid moving your furniture around. If any of your floors are uneven, fix them. If there are any pets around you, be aware of where they are. Review your medicines with your doctor. Some medicines can make you feel dizzy. This can increase your chance of falling. Ask your doctor what other things that you can do to help prevent falls. This information is not intended to replace advice given to you by your health care provider. Make sure you discuss any questions you have with your health care provider. Document Released: 01/07/2009 Document Revised: 08/19/2015 Document Reviewed: 04/17/2014 Elsevier Interactive Patient Education  2017 [employer].

## 2021-03-25 DIAGNOSIS — G4733 Obstructive sleep apnea (adult) (pediatric): Secondary | ICD-10-CM | POA: Diagnosis not present

## 2021-03-28 ENCOUNTER — Other Ambulatory Visit: Payer: Self-pay | Admitting: Family Medicine

## 2021-03-29 ENCOUNTER — Other Ambulatory Visit: Payer: Self-pay | Admitting: Family Medicine

## 2021-03-29 ENCOUNTER — Telehealth: Payer: Self-pay

## 2021-03-29 MED ORDER — OMEPRAZOLE 40 MG PO CPDR: 40.0000 mg | DELAYED_RELEASE_CAPSULE | Freq: Every day | ORAL | 3 refills | Status: DC

## 2021-03-29 MED ORDER — AIMOVIG 70 MG/ML ~~LOC~~ SOAJ: SUBCUTANEOUS | 3 refills | Status: DC

## 2021-03-29 NOTE — Telephone Encounter (Signed)
Refill sent / pt aware

## 2021-03-29 NOTE — Telephone Encounter (Signed)
Pt called in requesting a refill AIMOVIG 70 MG/ML SOAJ and omeprazole (PRILOSEC) 40 MG   Cb#: [phone]

## 2021-04-10 ENCOUNTER — Ambulatory Visit (INDEPENDENT_AMBULATORY_CARE_PROVIDER_SITE_OTHER): Payer: Medicare Other | Admitting: Neurology

## 2021-04-10 DIAGNOSIS — R634 Abnormal weight loss: Secondary | ICD-10-CM

## 2021-04-10 DIAGNOSIS — G4734 Idiopathic sleep related nonobstructive alveolar hypoventilation: Secondary | ICD-10-CM

## 2021-04-10 DIAGNOSIS — G4733 Obstructive sleep apnea (adult) (pediatric): Secondary | ICD-10-CM | POA: Diagnosis not present

## 2021-04-10 DIAGNOSIS — G4720 Circadian rhythm sleep disorder, unspecified type: Secondary | ICD-10-CM

## 2021-04-10 DIAGNOSIS — Z6841 Body Mass Index (BMI) 40.0 and over, adult: Secondary | ICD-10-CM

## 2021-04-13 NOTE — Procedures (Signed)
PATIENT'S NAME:  Kenneth [name], Kenneth [name] DOB:      08-May-1960      MR#:    [id number]     DATE OF RECORDING: 04/10/2021 REFERRING M.D.:  Ward Givens, NP, [name], MD Study Performed:   Baseline Polysomnogram HISTORY: 61 year old man with a history of hyperlipidemia, coronary artery disease, reflux disease, morbid obesity, arthritis, chronic bronchitis in the setting of smoking, obesity hypoventilation syndrome and syncopal event, who presents for re-evaluation of his obstructive sleep apnea. He has been on BiPAP therapy with suboptimal compliance.  The patient endorsed the Epworth Sleepiness Scale at 19 points. The patient's weight 281 pounds with a height of 66 (inches), resulting in a BMI of 45. kg/m2.   CURRENT MEDICATIONS: Aimovig, Ventolin HFA, Norvasc, Aspirin, Lipitor, Jardiance, Neurontin, Norco, Mobic, Glucophage, Nitrostat, Prilosec, Senokot-S, Topamax   PROCEDURE:  This is a multichannel digital polysomnogram utilizing the Somnostar 11.2 system.  Electrodes and sensors were applied and monitored per AASM Specifications.   EEG, EOG, Chin and Limb EMG, were sampled at 200 Hz.  ECG, Snore and Nasal Pressure, Thermal Airflow, Respiratory Effort, CPAP Flow and Pressure, Oximetry was sampled at 50 Hz. Digital video and audio were recorded.      BASELINE STUDY  Lights Out was at 21:45 and Lights On at 04:49.  Total recording time (TRT) was 424.5 minutes, with a total sleep time (TST) of 270 minutes.   The patient's sleep latency was 11 minutes.  REM latency was 68.5 minutes.  The sleep efficiency was 63.6 %.     SLEEP ARCHITECTURE: WASO (Wake after sleep onset) was 126.5 minutes with moderate sleep fragmentation noted. There were 59.5 minutes in Stage N1, 159.5 minutes Stage N2, 0 minutes Stage N3 and 51 minutes in Stage REM.  The percentage of Stage N1 was 22.%, which is markedly increased, Stage N2 was 59.1%, which is mildly increased, Stage N3 was absent, and Stage R (REM sleep) was 18.9%, which  is near-normal. The arousals were noted as: 125 were spontaneous, 0 were associated with PLMs, 32 were associated with respiratory events.  RESPIRATORY ANALYSIS:  There were a total of 121 respiratory events:  1 obstructive apneas, 0 central apneas and 0 mixed apneas with a total of 1 apneas and an apnea index (AI) of .2 /hour. There were 120 hypopneas with a hypopnea index of 26.7 /hour. The patient also had 0 respiratory event related arousals (RERAs).      The total APNEA/HYPOPNEA INDEX (AHI) was 26.9/hour and the total RESPIRATORY DISTURBANCE INDEX was  26.9 /hour.  43 events occurred in REM sleep and 154 events in NREM. The REM AHI was  50.6 /hour, versus a non-REM AHI of 21.4. The patient spent 11.5 minutes of total sleep time in the supine position and 259 minutes in non-supine.. The supine AHI was 15.7 versus a non-supine AHI of 27.4.  OXYGEN SATURATION & C02:  The Wake baseline 02 saturation was 92%, with the lowest being 72%. Time spent below 89% saturation equaled 227 minutes.   PERIODIC LIMB MOVEMENTS: The patient had a total of 0 Periodic Limb Movements.  The Periodic Limb Movement (PLM) index was 0 and the PLM Arousal index was 0/hour.  Audio and video analysis did not show any abnormal or unusual movements, behaviors, phonations or vocalizations. The patient took 3 bathroom breaks. Mild snoring was noted. The EKG was in keeping with normal sinus rhythm (NSR).  Post-study, the patient indicated that sleep was the same as usual.   IMPRESSION:  Obstructive Sleep Apnea (OSA) Nocturnal hypoxemia Dysfunctions associated with sleep stages or arousal from sleep  RECOMMENDATIONS:  This study demonstrates moderate to severe obstructive sleep apnea, with a total AHI of 26.9/hour, REM AHI of 50.6/hour, supine AHI of 15.7/hour and O2 nadir of 72% and significant time below 89% saturation of over 3.5 hours, in keeping with nocturnal hypoxemia. Ongoing treatment with positive airway pressure is  highly recommended. After meeting weight and BMI criteria, he may be a candidate for Inspire (hypoglossal nerve stimulator) in the future. For now, the patient will be encouraged to continue with his BiPAP therapy with full compliance. Concomitant weight loss is recommended. Please note that untreated obstructive sleep apnea may carry additional perioperative morbidity. Patients with significant obstructive sleep apnea should receive perioperative PAP therapy and the surgeons and particularly the anesthesiologist should be informed of the diagnosis and the severity of the sleep disordered breathing. This study shows sleep fragmentation and abnormal sleep stage percentages; these are nonspecific findings and per se do not signify an intrinsic sleep disorder or a cause for the patient's sleep-related symptoms. Causes include (but are not limited to) the first night effect of the sleep study, circadian rhythm disturbances, medication effect or an underlying mood disorder or medical problem.  The patient should be cautioned not to drive, work at heights, or operate dangerous or heavy equipment when tired or sleepy. Review and reiteration of good sleep hygiene measures should be pursued with any patient. The patient will be seen in follow-up in the sleep clinic at [facility] for discussion of the test results, symptom and treatment compliance review, further management strategies, etc. The referring provider will be notified of the test results.  I certify that I have reviewed the entire raw data recording prior to the issuance of this report in accordance with the Standards of Accreditation of the American Academy of Sleep Medicine (AASM)  Star Age, MD, PhD Diplomat, American Board of Neurology and Sleep Medicine (Neurology and Sleep Medicine)

## 2021-04-21 ENCOUNTER — Telehealth: Payer: Self-pay | Admitting: *Deleted

## 2021-04-21 NOTE — Telephone Encounter (Signed)
I called pt and spoke to Basilia Jumbo (sister in law) ok per DPR, that per Star Age, MD , Relayed Results (per  message below).  She verbalized understanding.  Pt is to continue BIPAP 4hour or more at night.  Continue with weight loss. Once meets criteria for BMI maybe candidate for Inspire.  She will relay to him.  F/u as scheduled.  Patient was last seen on 03/08/21 and was not fully compliant with his BiPAP. We talked about Inspire at the time and he was advised to proceed with a sleep study for re-evaluation, he had a diagnostic PSG on 04/10/21.    Please call and notify the patient that the recent sleep study showed moderate to severe obstructive sleep apnea (total AHI of 26.9/hour, REM AHI of 50.6/hour, supine AHI of 15.7/hour and O2 nadir of 72% and significant time below 89% saturation of over 3.5 hours, in keeping with nocturnal hypoxemia). I recommend ongoing treatment with positive airway pressure with his BiPAP machine. After meeting weight and BMI criteria, he may be a candidate for Inspire (hypoglossal nerve stimulator) in the future. For now, please encourage him to continue with his BiPAP therapy with full compliance. Concomitant weight loss is recommended.

## 2021-04-21 NOTE — Telephone Encounter (Signed)
-----   Message from Star Age, MD sent at 04/13/2021  6:15 PM EST ----- Patient was last seen on 03/08/21 and was not fully compliant with his BiPAP. We talked about Inspire at the time and he was advised to proceed with a sleep study for re-evaluation, he had a diagnostic PSG on 04/10/21.   Please call and notify the patient that the recent sleep study showed moderate to severe obstructive sleep apnea (total AHI of 26.9/hour, REM AHI of 50.6/hour, supine AHI of 15.7/hour and O2 nadir of 72% and significant time below 89% saturation of over 3.5 hours, in keeping with nocturnal hypoxemia). I recommend ongoing treatment with positive airway pressure with his BiPAP machine. After meeting weight and BMI criteria, he may be a candidate for Inspire (hypoglossal nerve stimulator) in the future. For now, please encourage him to continue with his BiPAP therapy with full compliance. Concomitant weight loss is recommended. He can FU as scheduled.

## 2021-04-25 DIAGNOSIS — H25813 Combined forms of age-related cataract, bilateral: Secondary | ICD-10-CM | POA: Diagnosis not present

## 2021-04-25 DIAGNOSIS — E119 Type 2 diabetes mellitus without complications: Secondary | ICD-10-CM | POA: Diagnosis not present

## 2021-04-25 DIAGNOSIS — G510 Bell's palsy: Secondary | ICD-10-CM | POA: Diagnosis not present

## 2021-04-25 DIAGNOSIS — H04123 Dry eye syndrome of bilateral lacrimal glands: Secondary | ICD-10-CM | POA: Diagnosis not present

## 2021-04-25 LAB — HM DIABETES EYE EXAM

## 2021-04-28 ENCOUNTER — Telehealth: Payer: Self-pay | Admitting: Pharmacist

## 2021-04-28 NOTE — Progress Notes (Signed)
° ° °Chronic Care Management °Pharmacy Assistant  ° °Name: Kenneth [name]  MRN: [id number] DOB: 05/05/1960 ° ° °Reason for Encounter: Disease State - Hypertension Call  °  ° °Recent office visits:  °03/24/21 Annual Medicare Wellness Completed. ° °03/08/21 Saima Athar, MD - Neurology - Severe OSA - No changes noted. Follow up in 1 year.  ° °Recent consult visits:  °03/08/21 Naiping Xu, MD - Orthopedics - Primary Osteoarthritis - Right knee joint injection performed.  ° °Hospital visits:  °None in previous 6 months ° °Medications: °Outpatient Encounter Medications as of 04/28/2021  °Medication Sig Note  ° Accu-Chek Softclix Lancets lancets 2 (two) times daily.   ° albuterol (VENTOLIN HFA) 108 (90 Base) MCG/ACT inhaler INHALE 2 PUFFS INTO THE LUNGS EVERY 4 HOURS AS NEEDED FOR SHORTNESS OF BREATH (Patient taking differently: Inhale 2 puffs into the lungs every 4 (four) hours as needed for wheezing or shortness of breath.)   ° amLODipine (NORVASC) 5 MG tablet Take 1 tablet (5 mg total) by mouth daily.   ° aspirin 81 MG chewable tablet Chew 81 mg by mouth in the morning.   ° atorvastatin (LIPITOR) 40 MG tablet Take 1 tablet (40 mg total) by mouth daily.   ° blood glucose meter kit and supplies KIT Dispense based on patient and insurance preference. Use up to four times daily as directed.   ° Blood Glucose Monitoring Suppl (ACCU-CHEK AVIVA PLUS) w/Device KIT Use as directed to monitor FSBS 2x daily. Dx: E11.9   ° empagliflozin (JARDIANCE) 25 MG TABS tablet Take 1 tablet (25 mg total) by mouth daily before breakfast.   ° Erenumab-aooe (AIMOVIG) 70 MG/ML SOAJ INJECT 70 MG INTO THE SKIN EVERY 30 DAYS.   ° gabapentin (NEURONTIN) 300 MG capsule Take 1 capsule (300 mg total) by mouth 3 (three) times daily as needed (nerve pain).   ° HYDROcodone-acetaminophen (NORCO) 5-325 MG tablet Take 1-2 tablets by mouth 2 (two) times daily as needed.   ° meloxicam (MOBIC) 15 MG tablet Take 1 tablet (15 mg total) by mouth daily.   ° metFORMIN  (GLUCOPHAGE) 1000 MG tablet TAKE 1 TABLET BY MOUTH 2 TIMES DAILY WITH A MEAL. (Patient taking differently: Take 1,000 mg by mouth 2 (two) times daily with a meal.)   ° nitroGLYCERIN (NITROSTAT) 0.4 MG SL tablet Place 1 tablet (0.4 mg total) under the tongue every 5 (five) minutes as needed. For chest pain (Patient taking differently: Place 0.4 mg under the tongue every 5 (five) minutes x 3 doses as needed for chest pain.) 10/08/2020: Patient has on hand if needed  ° omeprazole (PRILOSEC) 40 MG capsule Take 1 capsule (40 mg total) by mouth daily.   ° senna-docusate (SENOKOT-S) 8.6-50 MG tablet Take 1 tablet by mouth daily as needed (constipation).   ° topiramate (TOPAMAX) 25 MG tablet TAKE 2 TABLETS BY MOUTH 2 TIMES DAILY.   ° °Facility-Administered Encounter Medications as of 04/28/2021  °Medication  ° lactated ringers infusion  ° ° °Current antihypertensive regimen:  °Amlodipine 5mg daily ° °How often are you checking your Blood Pressure?  °Patient reported checking blood pressures every other day. ° ° °Current home BP readings: patient did not have any to report at this time.  ° ° °What recent interventions/DTPs have been made by any provider to improve Blood Pressure control since last CPP Visit:  °Patient reported he has not had any changes to his current medications.  ° ° °Any recent hospitalizations or ED visits since last visit with   with CPP?  Patient has not had any ED visits or hospitalizations since last visit with CPP.    What diet changes have been made to improve Blood Pressure Control?  Patient reported he tries to be careful with his sodium intake.    What exercise is being done to improve your Blood Pressure Control?  Patient reported he stays as active as he can and walks daily when he can.     Adherence Review: Is the patient currently on ACE/ARB medication? No Does the patient have >5 day gap between last estimated fill dates? No   Care Gaps   AWV: done 03/24/21 Colonoscopy: due  03/27/26 DM Eye Exam: due 03/29/21 DM Foot Exam: due 06/12/17 Microalbumin: done 12/18/19 HbgAIC: done 12/21/20 (6.2) DEXA: N/A Mammogram: N/A  Star Rating Drugs: metFORMIN (GLUCOPHAGE) 1000 MG tablet - last filled 04/18/21 90 days  atorvastatin (LIPITOR) 40 MG tablet - last filled 11/19//22 90 days   Future Appointments  Date Time Provider [place]  07/07/2021  2:15 PM [facility] PHARMACIST [facility] None  03/08/2022 10:45 AM Star Age, MD [facility] None  03/30/2022  2:45 PM [facility]-NURSE HEALTH ADVISOR [facility] None     Jobe Gibbon, [facility] Clinical Pharmacist Assistant  [phone]

## 2021-05-03 ENCOUNTER — Other Ambulatory Visit: Payer: Self-pay

## 2021-05-03 ENCOUNTER — Ambulatory Visit (INDEPENDENT_AMBULATORY_CARE_PROVIDER_SITE_OTHER): Payer: Medicare Other | Admitting: Family Medicine

## 2021-05-03 ENCOUNTER — Encounter: Payer: Self-pay | Admitting: Family Medicine

## 2021-05-03 VITALS — BP 124/62 | HR 93 | Temp 94.3°F | Resp 18 | Ht 66.0 in | Wt 278.0 lb

## 2021-05-03 DIAGNOSIS — M25561 Pain in right knee: Secondary | ICD-10-CM | POA: Diagnosis not present

## 2021-05-03 DIAGNOSIS — G8929 Other chronic pain: Secondary | ICD-10-CM

## 2021-05-03 MED ORDER — EMPAGLIFLOZIN 25 MG PO TABS: 25.0000 mg | ORAL_TABLET | Freq: Every day | ORAL | 5 refills | Status: DC

## 2021-05-03 NOTE — Progress Notes (Signed)
Subjective:    Patient ID: Kenneth [name], male    DOB: February 10, 1961, 61 y.o.   MRN: [id number]  Knee Pain   02/08/21 I saw the patient in September for right knee pain.  He has a history of meniscal repair in April.  I performed a cortisone injection to help with the knee pain.  However he now reports a burning stinging pain radiating down his right leg from around the fibula down to the lateral shin just above the ankle.  Is a burning fiery like pain.  He states the skin feels hot and tingling to the touch.  It sounds neuropathic.  There is no visible rash.  There is no erythema.  There is no palpable warmth.  He does not have any tenderness to palpation over the joint line.  He denies any pain in his knee with flexion or extension or walking.  At that time, my plan was:  This sounds almost neuropathic in nature.  I do not believe that this is related to his knee pain.  Therefore I will try the patient on gabapentin 300 mg p.o. 3 times daily and reassess in 2 weeks to see how he is doing.  Consider nerve conduction studies if persistent or worsening  05/03/20 Reports continued pain all around his right knee.  Patient saw no benefit from gabapentin.  He is having a difficult time even walking today.  He cannot bear weight on his right leg without sharp pain over the medial and lateral joint lines..  He has a moderate effusion palpable.  Patient states that he was released from orthopedics and that he did not need a knee replacement per their report.  However his effusion today is quite striking.  He is requesting a cortisone injection.  After the cortisone injection the pressure in his knee caused synovial fluid to ooze from the injection site. Past Medical History:  Diagnosis Date   Arthritis    Asthma    Chronic bronchitis (HCC)    Cocaine abuse ([place])    GERD (gastroesophageal reflux disease)    Headache    Hyperlipidemia    Hypertension    OSA treated with BiPAP    ahi-132, bipap 23/19    Syncope    Tobacco abuse    Past Surgical History:  Procedure Laterality Date   ESOPHAGOGASTRODUODENOSCOPY (EGD) WITH PROPOFOL N/A 12/11/2012   Procedure: ESOPHAGOGASTRODUODENOSCOPY (EGD) WITH PROPOFOL;  Surgeon: Arta Silence, MD;  Location: [facility];  Service: Endoscopy;  Laterality: N/A;   FINGER AMPUTATION     KNEE ARTHROSCOPY Left 11/15/2015   Procedure: ARTHROSCOPY KNEE AND MEDIAL MENISECTOMY;  Surgeon: Paralee Cancel, MD;  Location: [facility];  Service: Orthopedics;  Laterality: Left;   KNEE ARTHROSCOPY WITH LATERAL MENISECTOMY Right 07/14/2020   Procedure: RIGHT KNEE PARTIAL LATERAL MENISCECTOMY;  Surgeon: Leandrew Koyanagi, MD;  Location: [facility];  Service: Orthopedics;  Laterality: Right;   Current Outpatient Medications on File Prior to Visit  Medication Sig Dispense Refill   Accu-Chek Softclix Lancets lancets 2 (two) times daily.     albuterol (VENTOLIN HFA) 108 (90 Base) MCG/ACT inhaler INHALE 2 PUFFS INTO THE LUNGS EVERY 4 HOURS AS NEEDED FOR SHORTNESS OF BREATH (Patient taking differently: Inhale 2 puffs into the lungs every 4 (four) hours as needed for wheezing or shortness of breath.) 8.5 g 2   amLODipine (NORVASC) 5 MG tablet Take 1 tablet (5 mg total) by mouth daily. 90 tablet 3   aspirin 81 MG chewable tablet  Chew 81 mg by mouth in the morning.     atorvastatin (LIPITOR) 40 MG tablet Take 1 tablet (40 mg total) by mouth daily. 90 tablet 3   blood glucose meter kit and supplies KIT Dispense based on patient and insurance preference. Use up to four times daily as directed. 1 each 11   Blood Glucose Monitoring Suppl (ACCU-CHEK AVIVA PLUS) w/Device KIT Use as directed to monitor FSBS 2x daily. Dx: E11.9 1 kit 1   empagliflozin (JARDIANCE) 25 MG TABS tablet Take 1 tablet (25 mg total) by mouth daily before breakfast. 30 tablet 5   Erenumab-aooe (AIMOVIG) 70 MG/ML SOAJ INJECT 70 MG INTO THE SKIN EVERY 30 DAYS. 3 mL 3   gabapentin (NEURONTIN) 300 MG capsule Take 1 capsule (300 mg total)  by mouth 3 (three) times daily as needed (nerve pain). 90 capsule 3   HYDROcodone-acetaminophen (NORCO) 5-325 MG tablet Take 1-2 tablets by mouth 2 (two) times daily as needed. 20 tablet 0   meloxicam (MOBIC) 15 MG tablet Take 1 tablet (15 mg total) by mouth daily. 30 tablet 0   metFORMIN (GLUCOPHAGE) 1000 MG tablet TAKE 1 TABLET BY MOUTH 2 TIMES DAILY WITH A MEAL. (Patient taking differently: Take 1,000 mg by mouth 2 (two) times daily with a meal.) 180 tablet 3   nitroGLYCERIN (NITROSTAT) 0.4 MG SL tablet Place 1 tablet (0.4 mg total) under the tongue every 5 (five) minutes as needed. For chest pain (Patient taking differently: Place 0.4 mg under the tongue every 5 (five) minutes x 3 doses as needed for chest pain.) 20 tablet 1   omeprazole (PRILOSEC) 40 MG capsule Take 1 capsule (40 mg total) by mouth daily. 90 capsule 3   senna-docusate (SENOKOT-S) 8.6-50 MG tablet Take 1 tablet by mouth daily as needed (constipation).     topiramate (TOPAMAX) 25 MG tablet TAKE 2 TABLETS BY MOUTH 2 TIMES DAILY. 120 tablet 5   Current Facility-Administered Medications on File Prior to Visit  Medication Dose Route Frequency Provider Last Rate Last Admin   lactated ringers infusion    Continuous PRN Chyrel Masson, CRNA   New Bag at 11/15/15 1545   No Known Allergies Social History   Socioeconomic History   Marital status: Divorced    Spouse name: Not on file   Number of children: 0   Years of education: Not on file   Highest education level: Not on file  Occupational History   Not on file  Tobacco Use   Smoking status: Every Day    Packs/day: 0.50    Years: 40.00    Pack years: 20.00    Types: Cigarettes   Smokeless tobacco: Never   Tobacco comments:    06/05/17 1 pack per 1 1/2 days  Substance and Sexual Activity   Alcohol use: Yes    Alcohol/week: 7.0 standard drinks    Types: 7 Cans of beer per week   Drug use: Yes    Types: Cocaine    Comment: patient states none- read lab report  09/02/2012/ 11-20-12 case cx.due to positive screen test-pt. advised no recreational drugs to be used   Sexual activity: Not on file  Other Topics Concern   Not on file  Social History Narrative   Lives with brother and his sister in [place].     Social Determinants of Health   Financial Resource Strain: Low Risk    Difficulty of Paying Living Expenses: Not hard at all  Food Insecurity: No Food Insecurity  Worried About Charity fundraiser in the Last Year: Never true   [place] in the Last Year: Never true  Transportation Needs: No Transportation Needs   Lack of Transportation (Medical): No   Lack of Transportation (Non-Medical): No  Physical Activity: Insufficiently Active   Days of Exercise per Week: 3 days   Minutes of Exercise per Session: 20 min  Stress: No Stress Concern Present   Feeling of Stress : Not at all  Social Connections: Socially Isolated   Frequency of Communication with Friends and Family: More than three times a week   Frequency of Social Gatherings with Friends and Family: More than three times a week   Attends Religious Services: Never   Marine scientist or Organizations: No   Attends Archivist Meetings: Never   Marital Status: Divorced  Human resources officer Violence: Not At Risk   Fear of Current or Ex-Partner: No   Emotionally Abused: No   Physically Abused: No   Sexually Abused: No     Review of Systems  All other systems reviewed and are negative.     Objective:   Physical Exam Vitals reviewed.  Constitutional:      Appearance: He is obese.  Cardiovascular:     Rate and Rhythm: Normal rate and regular rhythm.  Pulmonary:     Effort: Pulmonary effort is normal.     Breath sounds: Normal breath sounds.  Musculoskeletal:     Right knee: Swelling and effusion present. No bony tenderness. Decreased range of motion. Tenderness present over the medial joint line and lateral joint line.  Neurological:     Mental Status: He is  alert.          Assessment & Plan:   Chronic pain of right knee Patient's pain is significant.  Patient is walking with a limp and has severe pain when he bears weight on his knee.  He also has a moderate effusion as dictated in the history of present illness.  It was difficult for me to even inject his knee due to the pressure and resistance.  Using sterile technique, I injected the right knee with 2 cc of lidocaine, 2 cc of Marcaine, and 2 cc of 40 mg/ml Kenalog.  However, I feel that there is something wrong with the patient's knee.  His pain is well beyond the level of arthritis seen on his x-ray.  I want to schedule the patient for an MRI of the knee to evaluate further.

## 2021-05-09 ENCOUNTER — Encounter: Payer: Self-pay | Admitting: Family Medicine

## 2021-05-09 NOTE — Telephone Encounter (Signed)
Received message back that pt had not read mychart message.  Mailed copy to pt.

## 2021-05-12 ENCOUNTER — Other Ambulatory Visit: Payer: Self-pay | Admitting: Family Medicine

## 2021-05-21 ENCOUNTER — Ambulatory Visit
Admission: RE | Admit: 2021-05-21 | Discharge: 2021-05-21 | Disposition: A | Discharge status: Home / Self Care | Payer: Medicare Other | Source: Ambulatory Visit | Attending: Family Medicine | Admitting: Family Medicine

## 2021-05-21 DIAGNOSIS — S83281A Other tear of lateral meniscus, current injury, right knee, initial encounter: Secondary | ICD-10-CM | POA: Diagnosis not present

## 2021-05-21 DIAGNOSIS — M7121 Synovial cyst of popliteal space [Baker], right knee: Secondary | ICD-10-CM | POA: Diagnosis not present

## 2021-05-21 DIAGNOSIS — G8929 Other chronic pain: Secondary | ICD-10-CM

## 2021-05-21 DIAGNOSIS — Z01818 Encounter for other preprocedural examination: Secondary | ICD-10-CM | POA: Diagnosis not present

## 2021-05-24 ENCOUNTER — Other Ambulatory Visit: Payer: Self-pay

## 2021-05-24 DIAGNOSIS — S83203D Other tear of unspecified meniscus, current injury, right knee, subsequent encounter: Secondary | ICD-10-CM

## 2021-05-25 ENCOUNTER — Other Ambulatory Visit: Payer: Self-pay | Admitting: Family Medicine

## 2021-05-25 DIAGNOSIS — G4452 New daily persistent headache (NDPH): Secondary | ICD-10-CM

## 2021-05-25 NOTE — Telephone Encounter (Signed)
Pt called in to request a refill of topiramate (TOPAMAX) 25 MG tablet  ? ?

## 2021-05-27 ENCOUNTER — Other Ambulatory Visit: Payer: Self-pay | Admitting: Family Medicine

## 2021-05-27 ENCOUNTER — Telehealth: Payer: Self-pay | Admitting: Family Medicine

## 2021-05-27 DIAGNOSIS — G4452 New daily persistent headache (NDPH): Secondary | ICD-10-CM

## 2021-05-27 MED ORDER — TOPIRAMATE 25 MG PO TABS: 50.0000 mg | ORAL_TABLET | Freq: Two times a day (BID) | ORAL | 5 refills | Status: DC

## 2021-05-27 MED ORDER — ACCU-CHEK SOFTCLIX LANCETS MISC: 12 refills | Status: AC

## 2021-05-27 MED ORDER — GLUCOSE BLOOD VI STRP: 1.0000 | ORAL_STRIP | Freq: Two times a day (BID) | 12 refills | Status: AC

## 2021-05-27 NOTE — Telephone Encounter (Signed)
Received call from patient to request refills of the following meds: ? ?topiramate (TOPAMAX) 25 MG tablet ?  ?    Test strips for Accu-Check ? ?    Accu-Chek Softclix Lancets lancets [id number]  ? ?Pharmacy [facility], [address]  ?[place], [place] [address]  ?Phone:  [phone]  Fax:  [phone]  ? ?[facility], [address]  ?[place], [place] [address]  ?Phone:  [phone]  Fax:  [phone]  ? ?Please advise at [phone] ?

## 2021-05-27 NOTE — Telephone Encounter (Signed)
Rx sent to pharmacy   

## 2021-06-01 ENCOUNTER — Other Ambulatory Visit: Payer: Self-pay | Admitting: Family Medicine

## 2021-06-01 DIAGNOSIS — G8929 Other chronic pain: Secondary | ICD-10-CM

## 2021-06-01 DIAGNOSIS — M23200 Derangement of unspecified lateral meniscus due to old tear or injury, right knee: Secondary | ICD-10-CM

## 2021-06-01 DIAGNOSIS — M25561 Pain in right knee: Secondary | ICD-10-CM

## 2021-06-02 ENCOUNTER — Telehealth: Payer: Self-pay

## 2021-06-02 NOTE — Telephone Encounter (Signed)
Pt called in wanting to speak with nurse about MRI results. Please call whenever possible. Please advise. ? ?Cb#: [phone] ?

## 2021-06-02 NOTE — Telephone Encounter (Signed)
Patient was confused about being called to schedule PT. Advised him referral was to orthopedics, not PT, and that I would send message to referral coord to review.  ? ?Kenneth [name] - I closed referral to Ortho, as Dr. Dennard Schaumann has now placed new referral to Ortho Surg. Patient requests any provider other than Dr. Erlinda Hong. Thanks! ?

## 2021-06-02 NOTE — Telephone Encounter (Signed)
LMTRC

## 2021-06-03 ENCOUNTER — Telehealth: Payer: Self-pay | Admitting: Pharmacist

## 2021-06-03 NOTE — Progress Notes (Signed)
? ? ?Chronic Care Management ?Pharmacy Assistant  ? ?Name: Kenneth [name]  MRN: [id number] DOB: [birth year] ? ? ?Reason for Encounter: Disease State - General Adherence Call  ?  ? ?Recent office visits:  ?05/03/21 [name], MD - Family Medicine - Chronic pain of right knee - MRI right knee w/o contrast ordered. Unsuccessful attempt to administer joint injection in office. Follow up as scheduled.  ? ?Recent consult visits:  ?None noted.  ? ?Hospital visits:  ?None in previous 6 months ? ?Medications: ?Outpatient Encounter Medications as of 06/03/2021  ?Medication Sig Note  ? ACCU-CHEK GUIDE test strip USE AS DIRECTED TO MONITOR FSBS 2 TIMES A DAY   ? Accu-Chek Softclix Lancets lancets Use as instructed   ? albuterol (VENTOLIN HFA) 108 (90 Base) MCG/ACT inhaler INHALE 2 PUFFS INTO THE LUNGS EVERY 4 HOURS AS NEEDED FOR SHORTNESS OF BREATH (Patient taking differently: Inhale 2 puffs into the lungs every 4 (four) hours as needed for wheezing or shortness of breath.)   ? amLODipine (NORVASC) 5 MG tablet Take 1 tablet (5 mg total) by mouth daily.   ? aspirin 81 MG chewable tablet Chew 81 mg by mouth in the morning.   ? atorvastatin (LIPITOR) 40 MG tablet Take 1 tablet (40 mg total) by mouth daily.   ? blood glucose meter kit and supplies KIT Dispense based on patient and insurance preference. Use up to four times daily as directed.   ? Blood Glucose Monitoring Suppl (ACCU-CHEK AVIVA PLUS) w/Device KIT Use as directed to monitor FSBS 2x daily. Dx: E11.9   ? empagliflozin (JARDIANCE) 25 MG TABS tablet Take 1 tablet (25 mg total) by mouth daily before breakfast.   ? Erenumab-aooe (AIMOVIG) 70 MG/ML SOAJ INJECT 70 MG INTO THE SKIN EVERY 30 DAYS.   ? gabapentin (NEURONTIN) 300 MG capsule TAKE 1 CAPSULE BY MOUTH 3 TIMES DAILY AS NEEDED (NERVE PAIN).   ? glucose blood test strip 1 each by Other route 2 (two) times daily. Use as instructed   ? HYDROcodone-acetaminophen (NORCO) 5-325 MG tablet Take 1-2 tablets by mouth 2 (two) times  daily as needed.   ? meloxicam (MOBIC) 15 MG tablet Take 1 tablet (15 mg total) by mouth daily.   ? metFORMIN (GLUCOPHAGE) 1000 MG tablet TAKE 1 TABLET BY MOUTH 2 TIMES DAILY WITH A MEAL. (Patient taking differently: Take 1,000 mg by mouth 2 (two) times daily with a meal.)   ? nitroGLYCERIN (NITROSTAT) 0.4 MG SL tablet Place 1 tablet (0.4 mg total) under the tongue every 5 (five) minutes as needed. For chest pain (Patient taking differently: Place 0.4 mg under the tongue every 5 (five) minutes x 3 doses as needed for chest pain.) 10/08/2020: Patient has on hand if needed  ? omeprazole (PRILOSEC) 40 MG capsule Take 1 capsule (40 mg total) by mouth daily.   ? senna-docusate (SENOKOT-S) 8.6-50 MG tablet Take 1 tablet by mouth daily as needed (constipation).   ? topiramate (TOPAMAX) 25 MG tablet Take 2 tablets (50 mg total) by mouth 2 (two) times daily.   ? ?Facility-Administered Encounter Medications as of 06/03/2021  ?Medication  ? lactated ringers infusion  ? ?Current antihypertensive regimen:  ?Amlodipine 59m daily ?  ?How often are you checking your Blood Pressure?  ?Patient reported checking blood pressures every other day. ?  ?  ?Current home BP readings: 138/72 (06/02/21) ?  ?  ?What recent interventions/DTPs have been made by any provider to improve Blood Pressure control since last CPP Visit:  ?  Patient reported he has not had any changes to his current medications.  ?  ?  ?Any recent hospitalizations or ED visits since last visit with CPP?  ?Patient has not had any ED visits or hospitalizations since last visit with CPP.  ?  ?  ?What diet changes have been made to improve Blood Pressure Control?  ?Patient reported he tries to be careful with his sodium intake.  ?  ?  ?What exercise is being done to improve your Blood Pressure Control?  ?Patient reported he stays as active as he can and walks daily when he can. He has been having some pain in his right knee lately and referred to Orthopedic surgeon.  ?  ?  ?   ?Adherence Review: ?Is the patient currently on ACE/ARB medication? No ?Does the patient have >5 day gap between last estimated fill dates? No ?  ? ? ?Care Gaps ?  ?AWV: done 03/24/21 ?Colonoscopy: due 03/27/26 ?DM Eye Exam: due 03/29/21 ?DM Foot Exam: due 06/12/17 ?Microalbumin: done 12/18/19 ?HbgAIC: done 12/21/20 (6.2) ?DEXA: N/A ?Mammogram: N/A ? ?Star Rating Drugs: ?atorvastatin (LIPITOR) 40 MG tablet - last filled 02/13/20 90 days ?empagliflozin (JARDIANCE) 25 MG TABS tablet - last filled 06/01/21 100 days ?metFORMIN (GLUCOPHAGE) 1000 MG tablet - last filled 04/18/21 90 days  ? ?Future Appointments  ?Date Time Provider [place]  ?06/08/2021  9:00 AM Leandrew Koyanagi, MD [facility] None  ?07/07/2021  2:15 PM [facility]-CCM PHARMACIST [facility]  ?03/08/2022 10:45 AM Star Age, MD [facility] None  ?03/30/2022  2:45 PM BSF[facility]-NURSE HEALTH ADVISOR [facility]  ? ? ? ?Liza Showfety, CCMA ?Clinical Pharmacist Assistant  ?[phone] ? ? ?

## 2021-06-06 ENCOUNTER — Other Ambulatory Visit: Payer: Self-pay | Admitting: Family Medicine

## 2021-06-06 DIAGNOSIS — M23200 Derangement of unspecified lateral meniscus due to old tear or injury, right knee: Secondary | ICD-10-CM

## 2021-06-06 DIAGNOSIS — G8929 Other chronic pain: Secondary | ICD-10-CM

## 2021-06-08 ENCOUNTER — Ambulatory Visit: Payer: Medicare Other | Admitting: Orthopaedic Surgery

## 2021-06-15 ENCOUNTER — Encounter (HOSPITAL_COMMUNITY): Payer: Self-pay | Admitting: Emergency Medicine

## 2021-06-15 ENCOUNTER — Emergency Department (HOSPITAL_COMMUNITY): Payer: Medicare Other

## 2021-06-15 ENCOUNTER — Other Ambulatory Visit: Payer: Self-pay

## 2021-06-15 ENCOUNTER — Emergency Department (HOSPITAL_COMMUNITY)
Admission: EM | Admit: 2021-06-15 | Discharge: 2021-06-15 | Disposition: A | Discharge status: Home / Self Care | Payer: Medicare Other | Attending: Emergency Medicine | Admitting: Emergency Medicine

## 2021-06-15 DIAGNOSIS — M545 Low back pain, unspecified: Secondary | ICD-10-CM | POA: Diagnosis not present

## 2021-06-15 DIAGNOSIS — Z041 Encounter for examination and observation following transport accident: Secondary | ICD-10-CM | POA: Diagnosis not present

## 2021-06-15 DIAGNOSIS — S3991XA Unspecified injury of abdomen, initial encounter: Secondary | ICD-10-CM | POA: Diagnosis not present

## 2021-06-15 DIAGNOSIS — Z79899 Other long term (current) drug therapy: Secondary | ICD-10-CM | POA: Diagnosis not present

## 2021-06-15 DIAGNOSIS — J984 Other disorders of lung: Secondary | ICD-10-CM

## 2021-06-15 DIAGNOSIS — S299XXA Unspecified injury of thorax, initial encounter: Secondary | ICD-10-CM | POA: Diagnosis not present

## 2021-06-15 DIAGNOSIS — M546 Pain in thoracic spine: Secondary | ICD-10-CM | POA: Insufficient documentation

## 2021-06-15 DIAGNOSIS — M25569 Pain in unspecified knee: Secondary | ICD-10-CM | POA: Diagnosis not present

## 2021-06-15 DIAGNOSIS — R1011 Right upper quadrant pain: Secondary | ICD-10-CM | POA: Diagnosis not present

## 2021-06-15 DIAGNOSIS — Z7982 Long term (current) use of aspirin: Secondary | ICD-10-CM | POA: Diagnosis not present

## 2021-06-15 DIAGNOSIS — R1012 Left upper quadrant pain: Secondary | ICD-10-CM | POA: Insufficient documentation

## 2021-06-15 DIAGNOSIS — S61412A Laceration without foreign body of left hand, initial encounter: Secondary | ICD-10-CM | POA: Diagnosis not present

## 2021-06-15 DIAGNOSIS — Z23 Encounter for immunization: Secondary | ICD-10-CM | POA: Insufficient documentation

## 2021-06-15 DIAGNOSIS — R918 Other nonspecific abnormal finding of lung field: Secondary | ICD-10-CM | POA: Diagnosis not present

## 2021-06-15 LAB — BASIC METABOLIC PANEL
Anion gap: 7 (ref 5–15)
BUN: 18 mg/dL (ref 8–23)
CO2: 23 mmol/L (ref 22–32)
Calcium: 8.8 mg/dL — ABNORMAL LOW (ref 8.9–10.3)
Chloride: 109 mmol/L (ref 98–111)
Creatinine, Ser: 0.75 mg/dL (ref 0.61–1.24)
GFR, Estimated: >60 mL/min (ref >60)
Glucose, Bld: 92 mg/dL (ref 70–99)
Potassium: 3.7 mmol/L (ref 3.5–5.1)
Sodium: 139 mmol/L (ref 135–145)

## 2021-06-15 LAB — I-STAT CHEM 8, ED
BUN: 17 mg/dL (ref 8–23)
Calcium, Ion: 1.15 mmol/L (ref 1.15–1.40)
Chloride: 107 mmol/L (ref 98–111)
Creatinine, Ser: 0.7 mg/dL (ref 0.61–1.24)
Glucose, Bld: 88 mg/dL (ref 70–99)
HCT: 52 % (ref 39.0–52.0)
Hemoglobin: 17.7 g/dL — ABNORMAL HIGH (ref 13.0–17.0)
Potassium: 3.7 mmol/L (ref 3.5–5.1)
Sodium: 143 mmol/L (ref 135–145)
TCO2: 24 mmol/L (ref 22–32)

## 2021-06-15 LAB — CBC WITH DIFFERENTIAL/PLATELET
Abs Immature Granulocytes: 0.03 10*3/uL (ref 0.00–0.07)
Basophils Absolute: 0 10*3/uL (ref 0.0–0.1)
Basophils Relative: 1 %
Eosinophils Absolute: 0.1 10*3/uL (ref 0.0–0.5)
Eosinophils Relative: 1 %
HCT: 50.1 % (ref 39.0–52.0)
Hemoglobin: 15.8 g/dL (ref 13.0–17.0)
Immature Granulocytes: 0 %
Lymphocytes Relative: 34 %
Lymphs Abs: 2.6 10*3/uL (ref 0.7–4.0)
MCH: 27 pg (ref 26.0–34.0)
MCHC: 31.5 g/dL (ref 30.0–36.0)
MCV: 85.5 fL (ref 80.0–100.0)
Monocytes Absolute: 0.7 10*3/uL (ref 0.1–1.0)
Monocytes Relative: 9 %
Neutro Abs: 4.2 10*3/uL (ref 1.7–7.7)
Neutrophils Relative %: 55 %
Platelets: 230 10*3/uL (ref 150–400)
RBC: 5.86 MIL/uL — ABNORMAL HIGH (ref 4.22–5.81)
RDW: 17.6 % — ABNORMAL HIGH (ref 11.5–15.5)
WBC: 7.6 10*3/uL (ref 4.0–10.5)
nRBC: 0 % (ref 0.0–0.2)

## 2021-06-15 MED ORDER — CYCLOBENZAPRINE HCL 10 MG PO TABS: 5.0000 mg | ORAL_TABLET | Freq: Two times a day (BID) | ORAL | 0 refills | Status: DC | PRN

## 2021-06-15 MED ORDER — CYCLOBENZAPRINE HCL 10 MG PO TABS: 5.0000 mg | ORAL_TABLET | Freq: Two times a day (BID) | ORAL | 0 refills | Status: AC | PRN

## 2021-06-15 MED ORDER — TETANUS-DIPHTH-ACELL PERTUSSIS 5-2.5-18.5 LF-MCG/0.5 IM SUSY
0.5000 mL | PREFILLED_SYRINGE | Freq: Once | INTRAMUSCULAR | Status: AC
  Administered 2021-06-15: 0.5 mL via INTRAMUSCULAR
  Filled 2021-06-15: qty 0.5

## 2021-06-15 MED ORDER — IOHEXOL 300 MG/ML  SOLN
100.0000 mL | Freq: Once | INTRAMUSCULAR | Status: AC | PRN
  Administered 2021-06-15: 100 mL via INTRAVENOUS

## 2021-06-15 MED ORDER — CELECOXIB 200 MG PO CAPS: 200.0000 mg | ORAL_CAPSULE | Freq: Two times a day (BID) | ORAL | 0 refills | Status: AC

## 2021-06-15 MED ORDER — DOXYCYCLINE HYCLATE 100 MG PO CAPS: 100.0000 mg | ORAL_CAPSULE | Freq: Two times a day (BID) | ORAL | 0 refills | Status: DC

## 2021-06-15 MED ORDER — FENTANYL CITRATE PF 50 MCG/ML IJ SOSY
50.0000 ug | PREFILLED_SYRINGE | Freq: Once | INTRAMUSCULAR | Status: AC
  Administered 2021-06-15: 50 ug via INTRAVENOUS
  Filled 2021-06-15: qty 1

## 2021-06-15 MED ORDER — ONDANSETRON HCL 4 MG/2ML IJ SOLN
4.0000 mg | Freq: Once | INTRAMUSCULAR | Status: AC
  Administered 2021-06-15: 4 mg via INTRAVENOUS
  Filled 2021-06-15: qty 2

## 2021-06-15 NOTE — Discharge Instructions (Addendum)
YOUR  skin CT scan showed some inflammatory changes.  Some of them look like you may have pneumonia and we will treat this with doxycycline.  It may be that you also had some changes of inflammatory lung disease which may be due to smoking.  Please follow-up with a pulmonologist. ? ?Return to the emergency department immediately if you develop any of the following symptoms: ?You have numbness, tingling, or weakness in the arms or legs. ?You develop severe headaches not relieved with medicine. ?You have severe neck pain, especially tenderness in the middle of the back of your neck. ?You have changes in bowel or bladder control. ?There is increasing pain in any area of the body. ?You have shortness of breath, light-headedness, dizziness, or fainting. ?You have chest pain. ?You feel sick to your stomach (nauseous), throw up (vomit), or sweat. ?You have increasing abdominal discomfort. ?There is blood in your urine, stool, or vomit. ?You have pain in your shoulder (shoulder strap areas). ?You feel your symptoms are getting worse. ? ?
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
## 2021-06-16 DIAGNOSIS — M25561 Pain in right knee: Secondary | ICD-10-CM | POA: Diagnosis not present

## 2021-06-27 DIAGNOSIS — G4733 Obstructive sleep apnea (adult) (pediatric): Secondary | ICD-10-CM | POA: Diagnosis not present

## 2021-06-30 NOTE — Progress Notes (Signed)
? ?Chronic Care Management ?Pharmacy Note ? ?07/07/2021 ?Name:  Kenneth [name] MRN:  [id number] DOB:  05/23/1960 ? ?Subjective: ?Kenneth [name] is an 61 y.o. year old male who is a primary patient of Kenneth [name], Kenneth Mcgee, MD.  The CCM team was consulted for assistance with disease management and care coordination needs.   ? ?Engaged with patient by telephone for follow up visit in response to provider referral for pharmacy case management and/or care coordination services.  ? ?Consent to Services:  ?The patient was given the following information about Chronic Care Management services today, agreed to services, and gave verbal consent: 1. CCM service includes personalized support from designated clinical staff supervised by the primary care provider, including individualized plan of care and coordination with other care providers 2. 24/7 contact phone numbers for assistance for urgent and routine care needs. 3. Service will only be billed when office clinical staff spend 20 minutes or more in a month to coordinate care. 4. Only one practitioner may furnish and bill the service in a calendar month. 5.The patient may stop CCM services at any time (effective at the end of the month) by phone call to the office staff. 6. The patient will be responsible for cost sharing (co-pay) of up to 20% of the service fee (after annual deductible is met). Patient agreed to services and consent obtained. ? ?Patient Care Team: ?[name], MD as PCP - General (Family Medicine) ?Lelon Perla, MD as PCP - Cardiology (Cardiology) ?Edythe Clarity, [facility] as Pharmacist (Pharmacist) ? ?Recent office visits: ?12/24/20 Dennard Schaumann) - patient received steroid injection in R knee as well as near plantar fascia of R foot. ? ?Recent consult visits: ?10/08/20 Stanford Breed) - For Syncope episodes, amlodipine decreased to 60m daily   ?  ?Hospital visits:  ?None since most recent CCM call ? ? ?Objective: ? ?Lab Results  ?Component Value Date  ?  CREATININE 0.70 06/15/2021  ? BUN 17 06/15/2021  ? GFRNONAA >60 06/15/2021  ? GFRAA 110 12/18/2019  ? NA 143 06/15/2021  ? K 3.7 06/15/2021  ? CALCIUM 8.8 (L) 06/15/2021  ? CO2 23 06/15/2021  ? GLUCOSE 88 06/15/2021  ? ? ?Lab Results  ?Component Value Date/Time  ? HGBA1C 6.2 (H) 12/21/2020 11:30 AM  ? HGBA1C 7.6 (H) 07/30/2020 02:35 AM  ? HGBA1C >14.0 (H) 01/28/2016 11:42 AM  ? MICROALBUR 0.6 12/18/2019 08:15 AM  ? MICROALBUR 1.1 05/22/2017 02:33 PM  ?  ?Last diabetic Eye exam:  ?Lab Results  ?Component Value Date/Time  ? HMDIABEYEEXA No Retinopathy 04/25/2021 12:00 AM  ?  ?Last diabetic Foot exam: No results found for: HMDIABFOOTEX  ? ?Lab Results  ?Component Value Date  ? CHOL 160 12/21/2020  ? HDL 50 12/21/2020  ? [place] 12/21/2020  ? TRIG 79 12/21/2020  ? CHOLHDL 3.2 12/21/2020  ? ? ? ?  Latest Ref Rng & Units 12/21/2020  ? 11:30 AM 02/17/2020  ?  2:18 PM 12/18/2019  ?  8:15 AM  ?Hepatic Function  ?Total Protein 6.1 - 8.1 g/dL 8.0   8.0   8.0    ?Albumin 3.5 - 5.0 g/dL  3.8     ?AST 10 - 35 U/L _0 ?ALT 9 - 46 U/L _1 ?Alk Phosphatase 38 - 126 U/L  65     ?Total Bilirubin 0.2 - 1.2 mg/dL 0.4   0.2  0.4    ? ? ?No results found for: TSH, FREET4 ? ? ?  Latest Ref Rng & Units 06/15/2021  ?  5:10 PM 06/15/2021  ?  5:00 PM 12/21/2020  ? 11:30 AM  ?CBC  ?WBC 4.0 - 10.5 K/uL  7.6   6.4    ?Hemoglobin 13.0 - 17.0 g/dL 17.7   15.8   16.0    ?Hematocrit 39.0 - 52.0 % 52.0   50.1   50.9    ?Platelets 150 - 400 K/uL  230   273    ? ? ?Lab Results  ?Component Value Date/Time  ? VD25OH 11 (L) 05/12/2009 12:33 AM  ? ? ?Clinical ASCVD: No  ?The 10-year ASCVD risk score (Arnett DK, et al., 2019) is: 26.7% ?  Values used to calculate the score: ?    Age: 61 years ?    Sex: Male ?    Is Non-Hispanic African American: Yes ?    Diabetic: Yes ?    Tobacco smoker: Yes ?    Systolic Blood Pressure: 104 mmHg ?    Is BP treated: Yes ?    HDL Cholesterol: 50 mg/dL ?    Total Cholesterol: 160 mg/dL   ? ? ?   03/24/2021  ?  2:47 PM 12/24/2020  ?  1:56 PM 11/03/2019  ? 12:16 PM  ?Depression screen PHQ 2/9  ?Decreased Interest 0 0 0  ?Down, Depressed, Hopeless 0 0 0  ?PHQ - 2 Score 0 0 0  ?  ? ? ?Social History  ? ?Tobacco Use  ?Smoking Status Every Day  ? Packs/day: 0.50  ? Years: 40.00  ? Pack years: 20.00  ? Types: Cigarettes  ?Smokeless Tobacco Never  ?Tobacco Comments  ? 06/05/17 1 pack per 1 1/2 days  ? ?BP Readings from Last 3 Encounters:  ?06/15/21 104/66  ?05/03/21 124/62  ?03/08/21 114/69  ? ?Pulse Readings from Last 3 Encounters:  ?06/15/21 88  ?05/03/21 93  ?03/08/21 90  ? ?Wt Readings from Last 3 Encounters:  ?05/03/21 278 lb (126.1 kg)  ?03/24/21 282 lb (127.9 kg)  ?03/08/21 282 lb (127.9 kg)  ? ?BMI Readings from Last 3 Encounters:  ?05/03/21 44.87 kg/m?  ?03/24/21 45.52 kg/m?  ?03/08/21 45.52 kg/m?  ? ? ?Assessment/Interventions: Review of patient past medical history, allergies, medications, health status, including review of consultants reports, laboratory and other test data, was performed as part of comprehensive evaluation and provision of chronic care management services.  ? ?SDOH:  (Social Determinants of Health) assessments and interventions performed: Yes ?  ?Financial Resource Strain: Low Risk   ? Difficulty of Paying Living Expenses: Not hard at all  ? ? ?Financial Resource Strain: Low Risk   ? Difficulty of Paying Living Expenses: Not hard at all  ? ? ?SDOH Screenings  ? ?Alcohol Screen: Low Risk   ? Last Alcohol Screening Score (AUDIT): 1  ?Depression (PHQ2-9): Low Risk   ? PHQ-2 Score: 0  ?Financial Resource Strain: Low Risk   ? Difficulty of Paying Living Expenses: Not hard at all  ?Food Insecurity: No Food Insecurity  ? Worried About Running Out of Food in the Last Year: Never true  ? Ran Out of Food in the Last Year: Never true  ?Housing: Low Risk   ? Last Housing Risk Score: 0  ?Physical Activity: Insufficiently Active  ? Days of Exercise per Week: 3 days  ? Minutes of Exercise per Session: 20  min  ?Social Connections: Socially Isolated  ?   Frequency of Communication with Friends and Family: More than three times a week  ? Frequency of Social Gatherings with Friends and Family: More than three times a week  ? Attends Religious Services: Never  ? Active Member of Clubs or Organizations: No  ? Attends Club or Organization Meetings: Never  ? Marital Status: Divorced  ?Stress: No Stress Concern Present  ? Feeling of Stress : Not at all  ?Tobacco Use: High Risk  ? Smoking Tobacco Use: Every Day  ? Smokeless Tobacco Use: Never  ? Passive Exposure: Not on file  ?Transportation Needs: No Transportation Needs  ? Lack of Transportation (Medical): No  ? Lack of Transportation (Non-Medical): No  ? ? ?CCM Care Plan ? ?No Known Allergies ? ?Medications Reviewed Today   ? ? Reviewed by Davis, Christian L, RPH (Pharmacist) on 07/07/21 at 1452  Med List Status: <None>  ? ?Medication Order Taking? Sig Documenting Provider Last Dose Status Informant  ?ACCU-CHEK GUIDE test strip [id number] Yes USE AS DIRECTED TO MONITOR FSBS 2 TIMES A DAY Kenneth [name], Warren T, MD Taking Active   ?Accu-Chek Softclix Lancets lancets [id number] Yes Use as instructed Kenneth [name], Warren T, MD Taking Active   ?albuterol (VENTOLIN HFA) 108 (90 Base) MCG/ACT inhaler [id number] Yes INHALE 2 PUFFS INTO THE LUNGS EVERY 4 HOURS AS NEEDED FOR SHORTNESS OF BREATH  ?Patient taking differently: Inhale 2 puffs into the lungs every 4 (four) hours as needed for wheezing or shortness of breath.  ? Kenneth [name], Warren T, MD Taking Active   ?amLODipine (NORVASC) 5 MG tablet [id number] Yes Take 1 tablet (5 mg total) by mouth daily. Crenshaw, Brian S, MD Taking Active   ?aspirin 81 MG chewable tablet [id number] Yes Chew 81 mg by mouth in the morning. [provider] Taking Active Self  ?atorvastatin (LIPITOR) 40 MG tablet [id number] Yes Take 1 tablet (40 mg total) by mouth daily. Kenneth [name], Warren T, MD Taking Active   ?blood glucose meter kit and supplies KIT [id number] Yes  Dispense based on patient and insurance preference. Use up to four times daily as directed. Kenneth [name], Warren T, MD Taking Active   ?Blood Glucose Monitoring Suppl (ACCU-CHEK AVIVA PLUS) w/Device KIT [id number] Yes U

## 2021-07-07 ENCOUNTER — Ambulatory Visit (INDEPENDENT_AMBULATORY_CARE_PROVIDER_SITE_OTHER): Payer: Medicare Other | Admitting: Pharmacist

## 2021-07-07 DIAGNOSIS — E119 Type 2 diabetes mellitus without complications: Secondary | ICD-10-CM

## 2021-07-07 DIAGNOSIS — E1169 Type 2 diabetes mellitus with other specified complication: Secondary | ICD-10-CM

## 2021-07-07 NOTE — Patient Instructions (Addendum)
Visit Information ? ? Goals Addressed   ? ?  ?  ?  ?  ? This Visit's Progress  ?  Monitor and Manage My Blood Sugar-Diabetes Type 2   On track  ?  Timeframe:  Long-Range Goal ?Priority:  High ?Start Date: 08/13/20                            ?Expected End Date: 02/13/21                     ? ?Follow Up Date 11/24/20 ?  ?- check blood sugar at prescribed times ?- check blood sugar before and after exercise ?- enter blood sugar readings and medication or insulin into daily log ?- take the blood sugar log to all doctor visits  ?  ?Why is this important?   ?Checking your blood sugar at home helps to keep it from getting very high or very low.  ?Writing the results in a diary or log helps the doctor know how to care for you.  ?Your blood sugar log should have the time, date and the results.  ?Also, write down the amount of insulin or other medicine that you take.  ?Other information, like what you ate, exercise done and how you were feeling, will also be helpful.   ?  ?Notes:  ? ?01/20/21 - Kenneth [name] started on Jardiance prior to this visit. ?  ? ?  ? ?Kenneth [name] Care Plan: General Pharmacy (Adult)  ?  ? ?Problem Identified: HTN, Asthma, GERD, Type II DM, HLD   ?Priority: High  ?Onset Date: 08/13/2020  ?  ? ?Long-Range Goal: Kenneth [name]-Specific Goal   ?Start Date: 08/13/2020  ?Expected End Date: 02/13/2021  ?Recent Progress: On track  ?Priority: High  ?Note:   ?Current Barriers:  ?Unable to achieve control of glucose  ?Suboptimal therapeutic regimen for cholesterol ? ?Pharmacist Clinical Goal(s):  ?Kenneth [name] will achieve control of glucose and LDL as evidenced by monitoring ?adhere to plan to optimize therapeutic regimen for cholesterol as evidenced by report of adherence to recommended medication management changes ?contact provider office for questions/concerns as evidenced notation of same in electronic health record through collaboration with PharmD and provider.  ? ?Interventions: ?1:1 collaboration with Kenneth Frizzle, Kenneth [name]  regarding development and update of comprehensive plan of care as evidenced by provider attestation and co-signature ?Inter-disciplinary care team collaboration (see longitudinal plan of care) ?Comprehensive medication review performed; medication list updated in electronic medical record ? ?Hypertension (BP goal <130/80) ?-Controlled ?-Current treatment: ?Amlodipine '5mg'$  daily ?-Medications previously tried: none noted   ?-Current home readings: 113/67-133/73 ?-Current dietary habits: Kenneth [name] has stopped drinking alcohol and is working on cutting back on breads and other carbs ?-Current exercise habits: does something active daily ?-Denies hypotensive/hypertensive symptoms ?-Educated on BP goals and benefits of medications for prevention of heart attack, stroke and kidney damage; ?Exercise goal of 150 minutes per week; ?Importance of home blood pressure monitoring; ?Symptoms of hypotension and importance of maintaining adequate hydration; ?-Counseled to monitor BP at home daily, document, and provide log at future appointments ?-Recommended to continue current medication ? ? ?Update 10/14/20 ?Amlodipine decreased to '5mg'$  by cardiology ?Recent home readings: Kenneth [name] has not been checking ?Denies any recent dizziness and feelings of passing out.  Does report a recent fall where Kenneth [name] slipped down rainy steps, attributes this to just slipping. ?Have asked him to monitor blood pressure at least once or twice weekly moving forward. ? ?  Continue current meds for now. ? ?Hyperlipidemia: (LDL goal < 70) ?-Uncontrolled ?-Current treatment: ?Atorvastatin '40mg'$  daily ?-Medications previously tried: none noted  ?-Current dietary patterns: see above ?-Current exercise habits: see above ?-Educated on Cholesterol goals;  ?Benefits of statin for ASCVD risk reduction; ?Importance of limiting foods high in cholesterol; ?Exercise goal of 150 minutes per week;  ?-Reviewed most recent lipid panel, LDL elevated at 134 Kenneth [name] tolerates Atorvastatin '10mg'$   find with no concerns.  ASCVD risk is 32% making him a candidate for a high intensity statin. ?-Collaborated with PCP, would recommend increasing to Atorvastatin '40mg'$  daily to get to goal LDL. ? ?Update 10/14/20 ?Taking new dose of Lipitor with no concerns. ?Encouraged continued adherence ?Recheck lipids in about another 30 days or next return for labs after that. ? ?Update 01/20/21 ?LDL improved with new dose of Atorvastatin which was increased to '40mg'$ .  Kenneth [name] reports Kenneth [name] is tolerating medication fine.  ASCVD risk now 28.3% which is still in the high risk category.  Counseled on importance of adherence to this medication.  Kenneth [name] reports taking it daily every morning.   ?Continue current meds at this time, recommend recheck lipids at next annual physical. ? ?Update 07/07/21 ?Continues to take statin medication and denies any adverse effects. ?LDL improved at last checkup. ?Due for repeat lipid panel in September, reminded Kenneth [name] to make appointment and Kenneth [name] is agreeable will schedule reminder call to make sure Kenneth [name] has scheduled physical. ? ?Diabetes (A1c goal <7%) ?-Not ideally controlled ?-Current medications: ?Metformin '1000mg'$  twice daily with meals ?Jardiance '25mg'$  daily ?-Medications previously tried: none noted  ?-Current home glucose readings ?fasting glucose: 108-165 ?post prandial glucose: 102-187 ?-Denies hypoglycemic/hyperglycemic symptoms ?-Current meal patterns: see above ?drinks: water, coke zero ?-Current exercise: see above ?-Educated on A1c and blood sugar goals; ?Complications of diabetes including kidney damage, retinal damage, and cardiovascular disease; ?Prevention and management of hypoglycemic episodes; ?Benefits of routine self-monitoring of blood sugar; ?-Counseled to check feet daily and get yearly eye exams ?-Recommended to continue current medication  ?-Continue to work on lifestyle changes, recommend repeat A1c around 3 months ? ?Update 10/14/20 ?Recent fasting sugars: 130s - has not been  monitoring like normal ?Have asked him to monitor and record at least fasting if possible. ?Kenneth [name] is really concerned about his tail bone pain at this time and trying to get that straight. ?Will have DM call in 30 days to assess. ?Continue current meds ? ?Update 01/20/2021 ?Recent glucose readings 110-187 ?Recently started Vania Rea - has medicaid so cost is not an issue at this time.  Kenneth [name] is working on lifestyle changes including limiting his carbohydrates (potatoes) and trying to do something active such as walking daily.  Reports adherence with meds.  Reported home glucose is controlled. ?Continue current meds no changes needed. ? ?Update 07/07/21 ?117 was most recent fasting glucose.  Most of them are around here. ?Kenneth [name] continues to take Jardiance and Metformin with no concern.  Kenneth [name] is due for an updated A1c.  Reminded Kenneth [name] to make appointment for upcoming physical. ?No adverse effects reported. ?Continue current medication regimen. ? ?Asthma (Goal: control symptoms and prevent exacerbations) ?-Not ideally controlled ?-Current treatment  ?Albuterol HFA 69mg prn ?-Medications previously tried: none noted  ?-Exacerbations requiring treatment in last 6 months: none ?-Kenneth [name] denies consistent use of maintenance inhaler ?-Frequency of rescue inhaler use: as needed, usually during strenuous activity ?-Counseled on Proper inhaler technique; ?Benefits of consistent maintenance inhaler use  ?-Reports sometimes Kenneth [name] will just be sitting and breathing heavy ?-  Recommended to continue current medication ?Could consider controller medication such as Dulera for maintenance. ? ?GERD (Goal: Minimize symptoms) ?-Controlled ?-Current treatment  ?Omeprazole '40mg'$  daily ?-Medications previously tried: none noted ?-Denies any symptoms and takes appropriately  ?-Recommended to continue current medication ? ? ?Kenneth [name] Goals/Self-Care Activities ?Kenneth [name] will:  ?- take medications as prescribed ?focus on medication adherence by pill  box ?check glucose daily, document, and provide at future appointments ?check blood pressure daily, document, and provide at future appointments ?target a minimum of 150 minutes of moderate intensity exercise weekly ?

## 2021-07-24 DIAGNOSIS — E1169 Type 2 diabetes mellitus with other specified complication: Secondary | ICD-10-CM | POA: Diagnosis not present

## 2021-07-24 DIAGNOSIS — E782 Mixed hyperlipidemia: Secondary | ICD-10-CM | POA: Diagnosis not present

## 2021-07-24 DIAGNOSIS — E119 Type 2 diabetes mellitus without complications: Secondary | ICD-10-CM

## 2021-07-25 ENCOUNTER — Ambulatory Visit (INDEPENDENT_AMBULATORY_CARE_PROVIDER_SITE_OTHER): Payer: Medicare Other | Admitting: Family Medicine

## 2021-07-25 VITALS — BP 126/68 | HR 104 | Temp 98.2°F | Ht 66.0 in | Wt 278.0 lb

## 2021-07-25 DIAGNOSIS — M109 Gout, unspecified: Secondary | ICD-10-CM

## 2021-07-25 DIAGNOSIS — E119 Type 2 diabetes mellitus without complications: Secondary | ICD-10-CM

## 2021-07-25 MED ORDER — AIMOVIG 140 MG/ML ~~LOC~~ SOAJ: 140.0000 mg | SUBCUTANEOUS | 11 refills | Status: DC

## 2021-07-25 MED ORDER — PREDNISONE 20 MG PO TABS: ORAL_TABLET | ORAL | 0 refills | Status: DC

## 2021-07-25 NOTE — Progress Notes (Signed)
? ?Subjective:  ? ? Patient ID: Kenneth [name], male    DOB: 12/06/1960, 61 y.o.   MRN: [id number] ? ?Patient presents with pain and swelling in his right foot.  The dorsal midfoot is erythematous and swollen and tender.  The plantar midfoot is also swollen and tender.  He states has been like that for 2 weeks.  It hurts to walk on his foot.  He denies any falls or injuries.  He took some of his family members "gout pills" and the pain improved some.  He denies any fevers or chills or injuries.  He is also overdue for his diabetes check.  He denies any polyuria polydipsia or blurry vision.  His blood pressure today is well controlled at 126/68.  He denies any chest pain or shortness of breath.  He continues to smoke and has no desire to quit. ?Past Medical History:  ?Diagnosis Date  ? Arthritis   ? Asthma   ? Chronic bronchitis ([place])   ? Cocaine abuse ([place])   ? GERD (gastroesophageal reflux disease)   ? Headache   ? Hyperlipidemia   ? Hypertension   ? OSA treated with BiPAP   ? ahi-132, bipap 23/19  ? Syncope   ? Tobacco abuse   ? ?Past Surgical History:  ?Procedure Laterality Date  ? ESOPHAGOGASTRODUODENOSCOPY (EGD) WITH PROPOFOL N/A 12/11/2012  ? Procedure: ESOPHAGOGASTRODUODENOSCOPY (EGD) WITH PROPOFOL;  Surgeon: Arta Silence, MD;  Location: [facility];  Service: Endoscopy;  Laterality: N/A;  ? FINGER AMPUTATION    ? KNEE ARTHROSCOPY Left 11/15/2015  ? Procedure: ARTHROSCOPY KNEE AND MEDIAL MENISECTOMY;  Surgeon: Paralee Cancel, MD;  Location: [facility];  Service: Orthopedics;  Laterality: Left;  ? KNEE ARTHROSCOPY WITH LATERAL MENISECTOMY Right 07/14/2020  ? Procedure: RIGHT KNEE PARTIAL LATERAL MENISCECTOMY;  Surgeon: Leandrew Koyanagi, MD;  Location: [facility];  Service: Orthopedics;  Laterality: Right;  ? ?Current Outpatient Medications on File Prior to Visit  ?Medication Sig Dispense Refill  ? ACCU-CHEK GUIDE test strip USE AS DIRECTED TO MONITOR FSBS 2 TIMES A DAY 100 strip 1  ? Accu-Chek Softclix Lancets lancets Use as  instructed 100 each 12  ? albuterol (VENTOLIN HFA) 108 (90 Base) MCG/ACT inhaler INHALE 2 PUFFS INTO THE LUNGS EVERY 4 HOURS AS NEEDED FOR SHORTNESS OF BREATH (Patient taking differently: Inhale 2 puffs into the lungs every 4 (four) hours as needed for wheezing or shortness of breath.) 8.5 g 2  ? amLODipine (NORVASC) 5 MG tablet Take 1 tablet (5 mg total) by mouth daily. 90 tablet 3  ? aspirin 81 MG chewable tablet Chew 81 mg by mouth in the morning.    ? atorvastatin (LIPITOR) 40 MG tablet Take 1 tablet (40 mg total) by mouth daily. 90 tablet 3  ? blood glucose meter kit and supplies KIT Dispense based on patient and insurance preference. Use up to four times daily as directed. 1 each 11  ? Blood Glucose Monitoring Suppl (ACCU-CHEK AVIVA PLUS) w/Device KIT Use as directed to monitor FSBS 2x daily. Dx: E11.9 1 kit 1  ? celecoxib (CELEBREX) 200 MG capsule Take 1 capsule (200 mg total) by mouth 2 (two) times daily. 20 capsule 0  ? cyclobenzaprine (FLEXERIL) 10 MG tablet Take 0.5-1 tablets (5-10 mg total) by mouth 2 (two) times daily as needed for muscle spasms. 20 tablet 0  ? doxycycline (VIBRAMYCIN) 100 MG capsule Take 1 capsule (100 mg total) by mouth 2 (two) times daily. One po bid x 7 days 14  capsule 0  ? empagliflozin (JARDIANCE) 25 MG TABS tablet Take 1 tablet (25 mg total) by mouth daily before breakfast. 30 tablet 5  ? gabapentin (NEURONTIN) 300 MG capsule TAKE 1 CAPSULE BY MOUTH 3 TIMES DAILY AS NEEDED (NERVE PAIN). 90 capsule 3  ? glucose blood test strip 1 each by Other route 2 (two) times daily. Use as instructed 100 each 12  ? HYDROcodone-acetaminophen (NORCO) 5-325 MG tablet Take 1-2 tablets by mouth 2 (two) times daily as needed. 20 tablet 0  ? meloxicam (MOBIC) 15 MG tablet Take 1 tablet (15 mg total) by mouth daily. 30 tablet 0  ? metFORMIN (GLUCOPHAGE) 1000 MG tablet TAKE 1 TABLET BY MOUTH 2 TIMES DAILY WITH A MEAL. (Patient taking differently: Take 1,000 mg by mouth 2 (two) times daily with a meal.)  180 tablet 3  ? nitroGLYCERIN (NITROSTAT) 0.4 MG SL tablet Place 1 tablet (0.4 mg total) under the tongue every 5 (five) minutes as needed. For chest pain (Patient taking differently: Place 0.4 mg under the tongue every 5 (five) minutes x 3 doses as needed for chest pain.) 20 tablet 1  ? omeprazole (PRILOSEC) 40 MG capsule Take 1 capsule (40 mg total) by mouth daily. 90 capsule 3  ? senna-docusate (SENOKOT-S) 8.6-50 MG tablet Take 1 tablet by mouth daily as needed (constipation).    ? topiramate (TOPAMAX) 25 MG tablet Take 2 tablets (50 mg total) by mouth 2 (two) times daily. 120 tablet 5  ? ?Current Facility-Administered Medications on File Prior to Visit  ?Medication Dose Route Frequency Provider Last Rate Last Admin  ? lactated ringers infusion    Continuous PRN Chyrel Masson, CRNA   New Bag at 11/15/15 1545  ? ?No Known Allergies ?Social History  ? ?Socioeconomic History  ? Marital status: Divorced  ?  Spouse name: Not on file  ? Number of children: 0  ? Years of education: Not on file  ? Highest education level: Not on file  ?Occupational History  ? Not on file  ?Tobacco Use  ? Smoking status: Every Day  ?  Packs/day: 0.50  ?  Years: 40.00  ?  Pack years: 20.00  ?  Types: Cigarettes  ? Smokeless tobacco: Never  ? Tobacco comments:  ?  06/05/17 1 pack per 1 1/2 days  ?Substance and Sexual Activity  ? Alcohol use: Yes  ?  Alcohol/week: 7.0 standard drinks  ?  Types: 7 Cans of beer per week  ? Drug use: Yes  ?  Types: Cocaine  ?  Comment: patient states none- read lab report 09/02/2012/ 11-20-12 case cx.due to positive screen test-pt. advised no recreational drugs to be used  ? Sexual activity: Not on file  ?Other Topics Concern  ? Not on file  ?Social History Narrative  ? Lives with brother and his sister in law.    ? ?Social Determinants of Health  ? ?Financial Resource Strain: Low Risk   ? Difficulty of Paying Living Expenses: Not hard at all  ?Food Insecurity: No Food Insecurity  ? Worried About Paediatric nurse in the Last Year: Never true  ? Ran Out of Food in the Last Year: Never true  ?Transportation Needs: No Transportation Needs  ? Lack of Transportation (Medical): No  ? Lack of Transportation (Non-Medical): No  ?Physical Activity: Insufficiently Active  ? Days of Exercise per Week: 3 days  ? Minutes of Exercise per Session: 20 min  ?Stress: No Stress Concern Present  ?  Feeling of Stress : Not at all  ?Social Connections: Socially Isolated  ? Frequency of Communication with Friends and Family: More than three times a week  ? Frequency of Social Gatherings with Friends and Family: More than three times a week  ? Attends Religious Services: Never  ? Active Member of Clubs or Organizations: No  ? Attends Archivist Meetings: Never  ? Marital Status: Divorced  ?Intimate Partner Violence: Not At Risk  ? Fear of Current or Ex-Partner: No  ? Emotionally Abused: No  ? Physically Abused: No  ? Sexually Abused: No  ? ? ? ?Review of Systems  ?All other systems reviewed and are negative. ? ?   ?Objective:  ? Physical Exam ?Vitals reviewed.  ?Constitutional:   ?   Appearance: He is obese.  ?Cardiovascular:  ?   Rate and Rhythm: Normal rate and regular rhythm.  ?Pulmonary:  ?   Effort: Pulmonary effort is normal.  ?   Breath sounds: Normal breath sounds.  ?Musculoskeletal:  ?   Right foot: Decreased range of motion. Swelling, tenderness and bony tenderness present.  ?Neurological:  ?   Mental Status: He is alert.  ? ? ? ? ? ?   ?Assessment & Plan:  ? ?Type 2 diabetes mellitus without complication, without long-term current use of insulin ([place]) - Plan: CBC with Differential/Platelet, Lipid panel, COMPLETE METABOLIC PANEL WITH GFR, Hemoglobin A1c ? ?Acute gout of right foot, unspecified cause - Plan: Uric acid ?Treat gout flare with a prednisone taper pack.  Check uric acid level.  If elevated above 6 would recommend starting allopurinol to reduce the frequency and severity of gout attacks.  Meanwhile check CBC  fasting lipid panel CMP and A1c.  Goal A1c is less than 6.5.  Goal LDL cholesterol is less than 100.  Recommended smoking cessation but patient is in the precontemplative phase.  Blood pressure is exce

## 2021-07-26 LAB — COMPLETE METABOLIC PANEL WITH GFR
AG Ratio: 1.3 (calc) (ref 1.0–2.5)
ALT: 21 U/L (ref 9–46)
AST: 14 U/L (ref 10–35)
Albumin: 4.2 g/dL (ref 3.6–5.1)
Alkaline phosphatase (APISO): 66 U/L (ref 35–144)
BUN: 17 mg/dL (ref 7–25)
CO2: 24 mmol/L (ref 20–32)
Calcium: 9.5 mg/dL (ref 8.6–10.3)
Chloride: 107 mmol/L (ref 98–110)
Creat: 1.23 mg/dL (ref 0.70–1.35)
Globulin: 3.3 g/dL (calc) (ref 1.9–3.7)
Glucose, Bld: 150 mg/dL — ABNORMAL HIGH (ref 65–99)
Potassium: 4.4 mmol/L (ref 3.5–5.3)
Sodium: 141 mmol/L (ref 135–146)
Total Bilirubin: 0.2 mg/dL (ref 0.2–1.2)
Total Protein: 7.5 g/dL (ref 6.1–8.1)
eGFR: 67 mL/min/{1.73_m2} (ref >=60)

## 2021-07-26 LAB — LIPID PANEL
Cholesterol: 167 mg/dL (ref <200)
HDL: 50 mg/dL (ref >=40)
LDL Cholesterol (Calc): 95 mg/dL (calc)
Non-HDL Cholesterol (Calc): 117 mg/dL (calc) (ref <130)
Total CHOL/HDL Ratio: 3.3 (calc) (ref <5.0)
Triglycerides: 128 mg/dL (ref <150)

## 2021-07-26 LAB — CBC WITH DIFFERENTIAL/PLATELET
Absolute Monocytes: 601 cells/uL (ref 200–950)
Basophils Absolute: 62 cells/uL (ref 0–200)
Basophils Relative: 1 %
Eosinophils Absolute: 81 cells/uL (ref 15–500)
Eosinophils Relative: 1.3 %
HCT: 50.8 % — ABNORMAL HIGH (ref 38.5–50.0)
Hemoglobin: 16.1 g/dL (ref 13.2–17.1)
Lymphs Abs: 2951 cells/uL (ref 850–3900)
MCH: 27.7 pg (ref 27.0–33.0)
MCHC: 31.7 g/dL — ABNORMAL LOW (ref 32.0–36.0)
MCV: 87.3 fL (ref 80.0–100.0)
MPV: 12.2 fL (ref 7.5–12.5)
Monocytes Relative: 9.7 %
Neutro Abs: 2505 cells/uL (ref 1500–7800)
Neutrophils Relative %: 40.4 %
Platelets: 235 10*3/uL (ref 140–400)
RBC: 5.82 10*6/uL — ABNORMAL HIGH (ref 4.20–5.80)
RDW: 14.1 % (ref 11.0–15.0)
Total Lymphocyte: 47.6 %
WBC: 6.2 10*3/uL (ref 3.8–10.8)

## 2021-07-26 LAB — HEMOGLOBIN A1C
Hgb A1c MFr Bld: 6.5 %{Hb} — ABNORMAL HIGH
Mean Plasma Glucose: 140 mg/dL
eAG (mmol/L): 7.7 mmol/L

## 2021-07-26 LAB — URIC ACID: Uric Acid, Serum: 4.9 mg/dL (ref 4.0–8.0)

## 2021-07-27 ENCOUNTER — Other Ambulatory Visit: Payer: Self-pay

## 2021-07-27 MED ORDER — ALLOPURINOL 200 MG PO TABS
200.0000 mg | ORAL_TABLET | Freq: Every day | ORAL | 3 refills | Status: AC
Start: 2021-07-27 — End: ?

## 2021-08-11 ENCOUNTER — Telehealth: Payer: Self-pay | Admitting: Pharmacist
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
## 2021-08-15 ENCOUNTER — Ambulatory Visit: Payer: Medicare Other | Admitting: Family Medicine

## 2021-08-18 ENCOUNTER — Ambulatory Visit (INDEPENDENT_AMBULATORY_CARE_PROVIDER_SITE_OTHER): Payer: Medicare Other | Admitting: Family Medicine

## 2021-08-18 ENCOUNTER — Ambulatory Visit
Admission: RE | Admit: 2021-08-18 | Discharge: 2021-08-18 | Disposition: A | Discharge status: Home / Self Care | Payer: Medicare Other | Source: Ambulatory Visit | Attending: Family Medicine | Admitting: Family Medicine

## 2021-08-18 ENCOUNTER — Encounter: Payer: Self-pay | Admitting: Family Medicine

## 2021-08-18 VITALS — BP 138/80 | HR 90 | Ht 66.0 in | Wt 280.0 lb

## 2021-08-18 DIAGNOSIS — M79671 Pain in right foot: Secondary | ICD-10-CM

## 2021-08-18 DIAGNOSIS — M7989 Other specified soft tissue disorders: Secondary | ICD-10-CM | POA: Diagnosis not present

## 2021-08-18 MED ORDER — SULFAMETHOXAZOLE-TRIMETHOPRIM 800-160 MG PO TABS: 1.0000 | ORAL_TABLET | Freq: Two times a day (BID) | ORAL | 0 refills | Status: DC

## 2021-08-18 NOTE — Progress Notes (Signed)
Subjective:    Patient ID: Kenneth [name], male    DOB: 01-06-1961, 61 y.o.   MRN: [id number] 07/25/21 Patient presents with pain and swelling in his right foot.  The dorsal midfoot is erythematous and swollen and tender.  The plantar midfoot is also swollen and tender.  He states has been like that for 2 weeks.  It hurts to walk on his foot.  He denies any falls or injuries.  He took some of his family members "gout pills" and the pain improved some.  He denies any fevers or chills or injuries.  He is also overdue for his diabetes check.  He denies any polyuria polydipsia or blurry vision.  His blood pressure today is well controlled at 126/68.  He denies any chest pain or shortness of breath.  He continues to smoke and has no desire to quit.  At that time, my plan was: Treat gout flare with a prednisone taper pack.  Check uric acid level.  If elevated above 6 would recommend starting allopurinol to reduce the frequency and severity of gout attacks.  Meanwhile check CBC fasting lipid panel CMP and A1c.  Goal A1c is less than 6.5.  Goal LDL cholesterol is less than 100.  Recommended smoking cessation but patient is in the precontemplative phase.  Blood pressure is excellent.  08/18/21 Patient DNKA last appointment. Uric acid was only 4.9.    See the photograph above.  Patient complains of pain in the dorsum of the midfoot.  He also complains of severe tenderness to palpation in the proximal dorsal aspect of the fourth and fifth MTP joints.  The arch of his foot is extremely tender to palpation.  Pain is 4 out of proportion to physical exam findings.  There is no significant swelling.  The foot does not appear to be starkly erythematous.  It is warm to the touch. Past Medical History:  Diagnosis Date   Arthritis    Asthma    Chronic bronchitis ([facility])    Cocaine abuse ([place])    GERD (gastroesophageal reflux disease)    Headache    Hyperlipidemia    Hypertension    OSA treated with BiPAP    ahi-132,  bipap 23/19   Syncope    Tobacco abuse    Past Surgical History:  Procedure Laterality Date   ESOPHAGOGASTRODUODENOSCOPY (EGD) WITH PROPOFOL N/A 12/11/2012   Procedure: ESOPHAGOGASTRODUODENOSCOPY (EGD) WITH PROPOFOL;  Surgeon: Arta Silence, MD;  [facility];  Service: Endoscopy;  Laterality: N/A;   FINGER AMPUTATION     KNEE ARTHROSCOPY Left 11/15/2015   Procedure: ARTHROSCOPY KNEE AND MEDIAL MENISECTOMY;  Surgeon: Paralee Cancel, MD;  [facility];  Service: Orthopedics;  Laterality: Left;   KNEE ARTHROSCOPY WITH LATERAL MENISECTOMY Right 07/14/2020   Procedure: RIGHT KNEE PARTIAL LATERAL MENISCECTOMY;  Surgeon: Leandrew Koyanagi, MD;  Location: [facility];  Service: Orthopedics;  Laterality: Right;   Current Outpatient Medications on File Prior to Visit  Medication Sig Dispense Refill   ACCU-CHEK GUIDE test strip USE AS DIRECTED TO MONITOR FSBS 2 TIMES A DAY 100 strip 1   Accu-Chek Softclix Lancets lancets Use as instructed 100 each 12   albuterol (VENTOLIN HFA) 108 (90 Base) MCG/ACT inhaler INHALE 2 PUFFS INTO THE LUNGS EVERY 4 HOURS AS NEEDED FOR SHORTNESS OF BREATH (Patient taking differently: Inhale 2 puffs into the lungs every 4 (four) hours as needed for wheezing or shortness of breath.) 8.5 g 2   Allopurinol 200 MG TABS Take  200 mg by mouth daily. TAKE 1 TAB (200MG)  PO DAILY 90 tablet 3   amLODipine (NORVASC) 5 MG tablet Take 1 tablet (5 mg total) by mouth daily. 90 tablet 3   aspirin 81 MG chewable tablet Chew 81 mg by mouth in the morning.     atorvastatin (LIPITOR) 40 MG tablet Take 1 tablet (40 mg total) by mouth daily. 90 tablet 3   blood glucose meter kit and supplies KIT Dispense based on patient and insurance preference. Use up to four times daily as directed. 1 each 11   Blood Glucose Monitoring Suppl (ACCU-CHEK AVIVA PLUS) w/Device KIT Use as directed to monitor FSBS 2x daily. Dx: E11.9 1 kit 1   celecoxib (CELEBREX) 200 MG capsule Take 1 capsule (200 mg total) by  mouth 2 (two) times daily. 20 capsule 0   cyclobenzaprine (FLEXERIL) 10 MG tablet Take 0.5-1 tablets (5-10 mg total) by mouth 2 (two) times daily as needed for muscle spasms. 20 tablet 0   doxycycline (VIBRAMYCIN) 100 MG capsule Take 1 capsule (100 mg total) by mouth 2 (two) times daily. One po bid x 7 days 14 capsule 0   empagliflozin (JARDIANCE) 25 MG TABS tablet Take 1 tablet (25 mg total) by mouth daily before breakfast. 30 tablet 5   Erenumab-aooe (AIMOVIG) 140 MG/ML SOAJ Inject 140 mg into the skin every 30 (thirty) days. 1.12 mL 11   gabapentin (NEURONTIN) 300 MG capsule TAKE 1 CAPSULE BY MOUTH 3 TIMES DAILY AS NEEDED (NERVE PAIN). 90 capsule 3   glucose blood test strip 1 each by Other route 2 (two) times daily. Use as instructed 100 each 12   HYDROcodone-acetaminophen (NORCO) 5-325 MG tablet Take 1-2 tablets by mouth 2 (two) times daily as needed. 20 tablet 0   meloxicam (MOBIC) 15 MG tablet Take 1 tablet (15 mg total) by mouth daily. 30 tablet 0   metFORMIN (GLUCOPHAGE) 1000 MG tablet TAKE 1 TABLET BY MOUTH 2 TIMES DAILY WITH A MEAL. (Patient taking differently: Take 1,000 mg by mouth 2 (two) times daily with a meal.) 180 tablet 3   nitroGLYCERIN (NITROSTAT) 0.4 MG SL tablet Place 1 tablet (0.4 mg total) under the tongue every 5 (five) minutes as needed. For chest pain (Patient taking differently: Place 0.4 mg under the tongue every 5 (five) minutes x 3 doses as needed for chest pain.) 20 tablet 1   omeprazole (PRILOSEC) 40 MG capsule Take 1 capsule (40 mg total) by mouth daily. 90 capsule 3   predniSONE (DELTASONE) 20 MG tablet 3 tabs poqday 1-2, 2 tabs poqday 3-4, 1 tab poqday 5-6 12 tablet 0   senna-docusate (SENOKOT-S) 8.6-50 MG tablet Take 1 tablet by mouth daily as needed (constipation).     topiramate (TOPAMAX) 25 MG tablet Take 2 tablets (50 mg total) by mouth 2 (two) times daily. 120 tablet 5   Current Facility-Administered Medications on File Prior to Visit  Medication Dose Route  Frequency Provider Last Rate Last Admin   lactated ringers infusion    Continuous PRN Chyrel Masson, CRNA   New Bag at 11/15/15 1545   No Known Allergies Social History   Socioeconomic History   Marital status: Divorced    Spouse name: Not on file   Number of children: 0   Years of education: Not on file   Highest education level: Not on file  Occupational History   Not on file  Tobacco Use   Smoking status: Every Day    Packs/day:  0.50    Years: 40.00    Pack years: 20.00    Types: Cigarettes   Smokeless tobacco: Never   Tobacco comments:    06/05/17 1 pack per 1 1/2 days  Substance and Sexual Activity   Alcohol use: Yes    Alcohol/week: 7.0 standard drinks    Types: 7 Cans of beer per week   Drug use: Yes    Types: Cocaine    Comment: patient states none- read lab report 09/02/2012/ 11-20-12 case cx.due to positive screen test-pt. advised no recreational drugs to be used   Sexual activity: Not on file  Other Topics Concern   Not on file  Social History Narrative   Lives with brother and his sister in [place].     Social Determinants of Health   Financial Resource Strain: Low Risk    Difficulty of Paying Living Expenses: Not hard at all  Food Insecurity: No Food Insecurity   Worried About Charity fundraiser in the Last Year: Never true   Surprise in the Last Year: Never true  Transportation Needs: No Transportation Needs   Lack of Transportation (Medical): No   Lack of Transportation (Non-Medical): No  Physical Activity: Insufficiently Active   Days of Exercise per Week: 3 days   Minutes of Exercise per Session: 20 min  Stress: No Stress Concern Present   Feeling of Stress : Not at all  Social Connections: Socially Isolated   Frequency of Communication with Friends and Family: More than three times a week   Frequency of Social Gatherings with Friends and Family: More than three times a week   Attends Religious Services: Never   Marine scientist  or Organizations: No   Attends Archivist Meetings: Never   Marital Status: Divorced  Human resources officer Violence: Not At Risk   Fear of Current or Ex-Partner: No   Emotionally Abused: No   Physically Abused: No   Sexually Abused: No     Review of Systems  All other systems reviewed and are negative.     Objective:   Physical Exam Vitals reviewed.  Constitutional:      Appearance: He is obese.  Cardiovascular:     Rate and Rhythm: Normal rate and regular rhythm.  Pulmonary:     Effort: Pulmonary effort is normal.     Breath sounds: Normal breath sounds.  Musculoskeletal:     Right foot: Decreased range of motion. Swelling, tenderness and bony tenderness present.  Neurological:     Mental Status: He is alert.          Assessment & Plan:   Right foot pain - Plan: DG Foot Complete Right Pain is 4 out of proportion to exam findings.  I do not feel that this is a gout flare.  Obtain an x-ray to evaluate for any fracture in the midfoot.  I doubt osteomyelitis however given his diabetes, I will start him on Bactrim double strength tablets twice daily for 7 days while awaiting the results of the x-rays.

## 2021-08-23 ENCOUNTER — Encounter: Payer: Self-pay | Admitting: Family Medicine

## 2021-08-23 ENCOUNTER — Telehealth: Payer: Self-pay

## 2021-08-23 NOTE — Telephone Encounter (Signed)
-----   Message from [name], MD sent at 08/23/2021  6:49 AM EDT ----- Kenneth [name] is normal. Recommend podiatry consult if still hurting.

## 2021-08-23 NOTE — Telephone Encounter (Signed)
I have attempted to contact this patient by phone with the following results: no answer/ vm was full.

## 2021-08-24 ENCOUNTER — Other Ambulatory Visit: Payer: Self-pay

## 2021-08-24 DIAGNOSIS — M79671 Pain in right foot: Secondary | ICD-10-CM

## 2021-08-24 DIAGNOSIS — M79604 Pain in right leg: Secondary | ICD-10-CM

## 2021-08-24 DIAGNOSIS — M109 Gout, unspecified: Secondary | ICD-10-CM

## 2021-08-24 DIAGNOSIS — M722 Plantar fascial fibromatosis: Secondary | ICD-10-CM

## 2021-09-09 ENCOUNTER — Other Ambulatory Visit: Payer: Self-pay | Admitting: Family Medicine

## 2021-09-09 DIAGNOSIS — E1169 Type 2 diabetes mellitus with other specified complication: Secondary | ICD-10-CM

## 2021-09-09 NOTE — Telephone Encounter (Signed)
Requested medication (s) are due for refill today: Yes  Requested medication (s) are on the active medication list: Yes  Last refill:  08/16/20  Future visit scheduled: No  Notes to clinic:  Prescription has expired.    Requested Prescriptions  Pending Prescriptions Disp Refills   atorvastatin (LIPITOR) 40 MG tablet [Pharmacy Med Name: ATORVASTATIN 40 MG TAB 40 Tablet] 90 tablet 3    Sig: Take 1 tablet (40 mg total) by mouth daily.     Cardiovascular:  Antilipid - Statins Failed - 09/09/2021 11:55 AM      Failed - Lipid Panel in normal range within the last 12 months    Cholesterol  Date Value Ref Range Status  07/25/2021 167 <200 mg/dL Final   LDL Cholesterol (Calc)  Date Value Ref Range Status  07/25/2021 95 mg/dL (calc) Final    Comment:    Reference range: <100 . Desirable range <100 mg/dL for primary prevention;   <70 mg/dL for patients with CHD or diabetic patients  with > or = 2 CHD risk factors. Marland Kitchen LDL-C is now calculated using the Martin-Hopkins  calculation, which is a validated novel method providing  better accuracy than the Friedewald equation in the  estimation of LDL-C.  Cresenciano Genre et al. Annamaria Helling. 5498;264(15): 2061-2068  (http://education.[facility].com/faq/FAQ164)    HDL  Date Value Ref Range Status  07/25/2021 50 > OR = 40 mg/dL Final   Triglycerides  Date Value Ref Range Status  07/25/2021 128 <150 mg/dL Final         Passed - Patient is not pregnant      Passed - Valid encounter within last 12 months    Recent Outpatient Visits           3 weeks ago Right foot pain   [place] [name], MD   1 month ago Type 2 diabetes mellitus without complication, without long-term current use of insulin ([place])   [place] [name], MD   4 months ago Chronic pain of right knee   [place] Dennard Schaumann Cammie Mcgee, MD   7 months ago Right leg pain   [place]  Dennard Schaumann, Cammie Mcgee, MD   8 months ago Plantar fasciitis, right   [place] Pickard, Cammie Mcgee, MD

## 2021-09-13 DIAGNOSIS — M792 Neuralgia and neuritis, unspecified: Secondary | ICD-10-CM | POA: Diagnosis not present

## 2021-09-13 DIAGNOSIS — S92034A Nondisplaced avulsion fracture of tuberosity of right calcaneus, initial encounter for closed fracture: Secondary | ICD-10-CM | POA: Diagnosis not present

## 2021-09-13 DIAGNOSIS — R262 Difficulty in walking, not elsewhere classified: Secondary | ICD-10-CM | POA: Diagnosis not present

## 2021-09-13 DIAGNOSIS — E1151 Type 2 diabetes mellitus with diabetic peripheral angiopathy without gangrene: Secondary | ICD-10-CM | POA: Diagnosis not present

## 2021-09-23 ENCOUNTER — Telehealth: Payer: Self-pay

## 2021-09-23 ENCOUNTER — Other Ambulatory Visit: Payer: Self-pay | Admitting: Family Medicine

## 2021-09-23 MED ORDER — LINACLOTIDE 145 MCG PO CAPS: 145.0000 ug | ORAL_CAPSULE | Freq: Every day | ORAL | 5 refills | Status: AC

## 2021-09-23 NOTE — Telephone Encounter (Signed)
Pt called stating that you gave pt some med samples a while back for his bowls? He would like for you to send in a Rx to pharmacy.  Please advice

## 2021-09-26 DIAGNOSIS — G4733 Obstructive sleep apnea (adult) (pediatric): Secondary | ICD-10-CM | POA: Diagnosis not present

## 2021-09-28 DIAGNOSIS — R262 Difficulty in walking, not elsewhere classified: Secondary | ICD-10-CM | POA: Diagnosis not present

## 2021-09-28 DIAGNOSIS — S92034D Nondisplaced avulsion fracture of tuberosity of right calcaneus, subsequent encounter for fracture with routine healing: Secondary | ICD-10-CM | POA: Diagnosis not present

## 2021-09-28 DIAGNOSIS — E1151 Type 2 diabetes mellitus with diabetic peripheral angiopathy without gangrene: Secondary | ICD-10-CM | POA: Diagnosis not present

## 2021-10-12 DIAGNOSIS — M722 Plantar fascial fibromatosis: Secondary | ICD-10-CM | POA: Diagnosis not present

## 2021-10-12 DIAGNOSIS — S92034D Nondisplaced avulsion fracture of tuberosity of right calcaneus, subsequent encounter for fracture with routine healing: Secondary | ICD-10-CM | POA: Diagnosis not present

## 2021-10-12 DIAGNOSIS — R262 Difficulty in walking, not elsewhere classified: Secondary | ICD-10-CM | POA: Diagnosis not present

## 2021-10-31 ENCOUNTER — Other Ambulatory Visit: Payer: Self-pay | Admitting: Family Medicine

## 2021-10-31 ENCOUNTER — Telehealth: Payer: Self-pay

## 2021-10-31 ENCOUNTER — Other Ambulatory Visit: Payer: Self-pay

## 2021-10-31 MED ORDER — EMPAGLIFLOZIN 25 MG PO TABS: 25.0000 mg | ORAL_TABLET | Freq: Every day | ORAL | 5 refills | Status: DC

## 2021-10-31 NOTE — Telephone Encounter (Signed)
Pt called in requesting a refill of empagliflozin (JARDIANCE) 25 MG TABS tablet [[id number]] to be sent to [facility].  Cb#: [phone]

## 2021-11-10 DIAGNOSIS — M722 Plantar fascial fibromatosis: Secondary | ICD-10-CM | POA: Diagnosis not present

## 2021-11-10 DIAGNOSIS — S92034D Nondisplaced avulsion fracture of tuberosity of right calcaneus, subsequent encounter for fracture with routine healing: Secondary | ICD-10-CM | POA: Diagnosis not present

## 2021-11-10 DIAGNOSIS — R262 Difficulty in walking, not elsewhere classified: Secondary | ICD-10-CM | POA: Diagnosis not present

## 2021-11-10 DIAGNOSIS — E1151 Type 2 diabetes mellitus with diabetic peripheral angiopathy without gangrene: Secondary | ICD-10-CM | POA: Diagnosis not present

## 2021-11-10 DIAGNOSIS — M7671 Peroneal tendinitis, right leg: Secondary | ICD-10-CM | POA: Diagnosis not present

## 2021-11-22 ENCOUNTER — Ambulatory Visit (INDEPENDENT_AMBULATORY_CARE_PROVIDER_SITE_OTHER): Payer: Medicare Other | Admitting: Family Medicine

## 2021-11-22 ENCOUNTER — Other Ambulatory Visit: Payer: Self-pay | Admitting: Cardiology

## 2021-11-22 VITALS — BP 120/80 | HR 82 | Temp 98.6°F | Ht 66.0 in | Wt 277.6 lb

## 2021-11-22 DIAGNOSIS — G629 Polyneuropathy, unspecified: Secondary | ICD-10-CM

## 2021-11-22 DIAGNOSIS — M79671 Pain in right foot: Secondary | ICD-10-CM | POA: Diagnosis not present

## 2021-11-22 NOTE — Progress Notes (Signed)
Subjective:    Patient ID: Kenneth [name], male    DOB: 01-Dec-1960, 61 y.o.   MRN: [id number] 07/25/21 Patient presents with pain and swelling in his right foot.  The dorsal midfoot is erythematous and swollen and tender.  The plantar midfoot is also swollen and tender.  He states has been like that for 2 weeks.  It hurts to walk on his foot.  He denies any falls or injuries.  He took some of his family members "gout pills" and the pain improved some.  He denies any fevers or chills or injuries.  He is also overdue for his diabetes check.  He denies any polyuria polydipsia or blurry vision.  His blood pressure today is well controlled at 126/68.  He denies any chest pain or shortness of breath.  He continues to smoke and has no desire to quit.  At that time, my plan was: Treat gout flare with a prednisone taper pack.  Check uric acid level.  If elevated above 6 would recommend starting allopurinol to reduce the frequency and severity of gout attacks.  Meanwhile check CBC fasting lipid panel CMP and A1c.  Goal A1c is less than 6.5.  Goal LDL cholesterol is less than 100.  Recommended smoking cessation but patient is in the precontemplative phase.  Blood pressure is excellent.  08/18/21 Patient DNKA last appointment. Uric acid was only 4.9.    See the photograph above.  Patient complains of pain in the dorsum of the midfoot.  He also complains of severe tenderness to palpation in the proximal dorsal aspect of the fourth and fifth MTP joints.  The arch of his foot is extremely tender to palpation.  Pain is 4 out of proportion to physical exam findings.  There is no significant swelling.  The foot does not appear to be starkly erythematous.  It is warm to the touch.  At that time, my plan was:  Pain is 4 out of proportion to exam findings.  I do not feel that this is a gout flare.  Obtain an x-ray to evaluate for any fracture in the midfoot.  I doubt osteomyelitis however given his diabetes, I will start him on  Bactrim double strength tablets twice daily for 7 days while awaiting the results of the x-rays.  11/22/21 X-ray was unremarkable.  Given the pain that the patient was feeling out of proportional to exam and x-ray 5 things, I consulted podiatry.  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  Patient received a cortisone injection near the insertion of the peroneal tendon on August 17.  He was placed in a walking boot.  None of this has helped his symptoms.  He now complains of pain in the dorsum of his foot adjacent to the ankle.  He reports a burning stinging pain that radiates from his ankle to his knee.  Again the pain is out of proportion to exam findings.  He states that it burns at night and keeps him awake even while lying in bed. Xray 5/23: There is no acute fracture or dislocation identified. Soft tissues are within normal limits. There are mild degenerative changes of the first interphalangeal joint and first metatarsophalangeal joint. There is minimal degenerative scratch at small plantar and posterior calcaneal spurs are present. Past Medical History:  Diagnosis Date   Arthritis    Asthma    Chronic bronchitis ([place])    Cocaine abuse ([place])    GERD (  gastroesophageal reflux disease)    Headache    Hyperlipidemia    Hypertension    OSA treated with BiPAP    ahi-132, bipap 23/19   Syncope    Tobacco abuse    Past Surgical History:  Procedure Laterality Date   ESOPHAGOGASTRODUODENOSCOPY (EGD) WITH PROPOFOL N/A 12/11/2012   Procedure: ESOPHAGOGASTRODUODENOSCOPY (EGD) WITH PROPOFOL;  Surgeon: Arta Silence, MD;  Location: [facility];  Service: Endoscopy;  Laterality: N/A;   FINGER AMPUTATION     KNEE ARTHROSCOPY Left 11/15/2015   Procedure: ARTHROSCOPY KNEE AND MEDIAL MENISECTOMY;  Surgeon: Paralee Cancel, MD;  Location: [facility];  Service: Orthopedics;  Laterality: Left;   KNEE ARTHROSCOPY WITH LATERAL MENISECTOMY  Right 07/14/2020   Procedure: RIGHT KNEE PARTIAL LATERAL MENISCECTOMY;  Surgeon: Leandrew Koyanagi, MD;  Location: [place];  Service: Orthopedics;  Laterality: Right;   Current Outpatient Medications on File Prior to Visit  Medication Sig Dispense Refill   ACCU-CHEK GUIDE test strip USE AS DIRECTED TO MONITOR FSBS 2 TIMES A DAY 100 strip 1   Accu-Chek Softclix Lancets lancets Use as instructed 100 each 12   albuterol (VENTOLIN HFA) 108 (90 Base) MCG/ACT inhaler INHALE 2 PUFFS INTO THE LUNGS EVERY 4 HOURS AS NEEDED FOR SHORTNESS OF BREATH (Patient taking differently: Inhale 2 puffs into the lungs every 4 (four) hours as needed for wheezing or shortness of breath.) 8.5 g 2   Allopurinol 200 MG TABS Take 200 mg by mouth daily. TAKE 1 TAB (200MG)  PO DAILY 90 tablet 3   amLODipine (NORVASC) 5 MG tablet Take 1 tablet (5 mg total) by mouth daily. 90 tablet 3   aspirin 81 MG chewable tablet Chew 81 mg by mouth in the morning.     atorvastatin (LIPITOR) 40 MG tablet TAKE 1 TABLET (40 MG TOTAL) BY MOUTH DAILY. 90 tablet 3   blood glucose meter kit and supplies KIT Dispense based on patient and insurance preference. Use up to four times daily as directed. 1 each 11   Blood Glucose Monitoring Suppl (ACCU-CHEK AVIVA PLUS) w/Device KIT Use as directed to monitor FSBS 2x daily. Dx: E11.9 1 kit 1   celecoxib (CELEBREX) 200 MG capsule Take 1 capsule (200 mg total) by mouth 2 (two) times daily. 20 capsule 0   cyclobenzaprine (FLEXERIL) 10 MG tablet Take 0.5-1 tablets (5-10 mg total) by mouth 2 (two) times daily as needed for muscle spasms. 20 tablet 0   doxycycline (VIBRAMYCIN) 100 MG capsule Take 1 capsule (100 mg total) by mouth 2 (two) times daily. One po bid x 7 days 14 capsule 0   empagliflozin (JARDIANCE) 25 MG TABS tablet Take 1 tablet (25 mg total) by mouth daily before breakfast. 30 tablet 5   Erenumab-aooe (AIMOVIG) 140 MG/ML SOAJ Inject 140 mg into the skin every 30 (thirty) days. 1.12 mL 11   gabapentin  (NEURONTIN) 300 MG capsule TAKE 1 CAPSULE BY MOUTH 3 TIMES DAILY AS NEEDED (NERVE PAIN). 90 capsule 3   glucose blood test strip 1 each by Other route 2 (two) times daily. Use as instructed 100 each 12   HYDROcodone-acetaminophen (NORCO) 5-325 MG tablet Take 1-2 tablets by mouth 2 (two) times daily as needed. 20 tablet 0   linaclotide (LINZESS) 145 MCG CAPS capsule Take 1 capsule (145 mcg total) by mouth daily before breakfast. 30 capsule 5   meloxicam (MOBIC) 15 MG tablet Take 1 tablet (15 mg total) by mouth daily. 30 tablet 0   metFORMIN (GLUCOPHAGE) 1000 MG tablet  TAKE 1 TABLET BY MOUTH 2 TIMES DAILY WITH A MEAL. (Patient taking differently: Take 1,000 mg by mouth 2 (two) times daily with a meal.) 180 tablet 3   nitroGLYCERIN (NITROSTAT) 0.4 MG SL tablet Place 1 tablet (0.4 mg total) under the tongue every 5 (five) minutes as needed. For chest pain (Patient taking differently: Place 0.4 mg under the tongue every 5 (five) minutes x 3 doses as needed for chest pain.) 20 tablet 1   omeprazole (PRILOSEC) 40 MG capsule Take 1 capsule (40 mg total) by mouth daily. 90 capsule 3   predniSONE (DELTASONE) 20 MG tablet 3 tabs poqday 1-2, 2 tabs poqday 3-4, 1 tab poqday 5-6 12 tablet 0   senna-docusate (SENOKOT-S) 8.6-50 MG tablet Take 1 tablet by mouth daily as needed (constipation).     sulfamethoxazole-trimethoprim (BACTRIM DS) 800-160 MG tablet Take 1 tablet by mouth 2 (two) times daily. 14 tablet 0   topiramate (TOPAMAX) 25 MG tablet Take 2 tablets (50 mg total) by mouth 2 (two) times daily. 120 tablet 5   Current Facility-Administered Medications on File Prior to Visit  Medication Dose Route Frequency Provider Last Rate Last Admin   lactated ringers infusion    Continuous PRN Chyrel Masson, CRNA   New Bag at 11/15/15 1545   No Known Allergies Social History   Socioeconomic History   Marital status: Divorced    Spouse name: Not on file   Number of children: 0   Years of education: Not on  file   Highest education level: Not on file  Occupational History   Not on file  Tobacco Use   Smoking status: Every Day    Packs/day: 0.50    Years: 40.00    Total pack years: 20.00    Types: Cigarettes   Smokeless tobacco: Never   Tobacco comments:    06/05/17 1 pack per 1 1/2 days  Substance and Sexual Activity   Alcohol use: Yes    Alcohol/week: 7.0 standard drinks of alcohol    Types: 7 Cans of beer per week   Drug use: Yes    Types: Cocaine    Comment: patient states none- read lab report 09/02/2012/ 11-20-12 case cx.due to positive screen test-pt. advised no recreational drugs to be used   Sexual activity: Not on file  Other Topics Concern   Not on file  Social History Narrative   Lives with brother and his sister in [place].     Social Determinants of Health   Financial Resource Strain: Low Risk  (03/24/2021)   Overall Financial Resource Strain (CARDIA)    Difficulty of Paying Living Expenses: Not hard at all  Food Insecurity: No Food Insecurity (03/24/2021)   Hunger Vital Sign    Worried About Running Out of Food in the Last Year: Never true    Ran Out of Food in the Last Year: Never true  Transportation Needs: No Transportation Needs (03/24/2021)   PRAPARE - Hydrologist (Medical): No    Lack of Transportation (Non-Medical): No  Physical Activity: Insufficiently Active (03/24/2021)   Exercise Vital Sign    Days of Exercise per Week: 3 days    Minutes of Exercise per Session: 20 min  Stress: No Stress Concern Present (03/24/2021)   [place]    Feeling of Stress : Not at all  Social Connections: Socially Isolated (03/24/2021)   Social Connection and Isolation Panel [NHANES]  Frequency of Communication with Friends and Family: More than three times a week    Frequency of Social Gatherings with Friends and Family: More than three times a week    Attends Religious  Services: Never    Marine scientist or Organizations: No    Attends Archivist Meetings: Never    Marital Status: Divorced  Human resources officer Violence: Not At Risk (03/24/2021)   Humiliation, Afraid, Rape, and Kick questionnaire    Fear of Current or Ex-Partner: No    Emotionally Abused: No    Physically Abused: No    Sexually Abused: No     Review of Systems  All other systems reviewed and are negative.      Objective:   Physical Exam Vitals reviewed.  Constitutional:      Appearance: He is obese.  Cardiovascular:     Rate and Rhythm: Normal rate and regular rhythm.  Pulmonary:     Effort: Pulmonary effort is normal.     Breath sounds: Normal breath sounds.  Musculoskeletal:     Right foot: Decreased range of motion. Swelling, tenderness and bony tenderness present.  Neurological:     Mental Status: He is alert.           Assessment & Plan:   Right foot pain  Neuropathy Patient's exam is unchanged from his last visit.  This has been going on now for several months.  I question if the patient may be dealing with neuropathic pain rather than a mechanical problem with the fluid.  I recommended increasing gabapentin to 600 mg p.o. 3 times daily to see if the pain will improve.  Reassess in 1 month

## 2021-12-24 DIAGNOSIS — M25511 Pain in right shoulder: Secondary | ICD-10-CM | POA: Diagnosis not present

## 2021-12-26 ENCOUNTER — Other Ambulatory Visit: Payer: Self-pay | Admitting: Family Medicine

## 2021-12-26 DIAGNOSIS — G4733 Obstructive sleep apnea (adult) (pediatric): Secondary | ICD-10-CM | POA: Diagnosis not present

## 2022-01-02 DIAGNOSIS — M7671 Peroneal tendinitis, right leg: Secondary | ICD-10-CM | POA: Diagnosis not present

## 2022-01-02 DIAGNOSIS — M722 Plantar fascial fibromatosis: Secondary | ICD-10-CM | POA: Diagnosis not present

## 2022-01-02 DIAGNOSIS — S92034D Nondisplaced avulsion fracture of tuberosity of right calcaneus, subsequent encounter for fracture with routine healing: Secondary | ICD-10-CM | POA: Diagnosis not present

## 2022-01-02 DIAGNOSIS — I739 Peripheral vascular disease, unspecified: Secondary | ICD-10-CM | POA: Diagnosis not present

## 2022-01-02 DIAGNOSIS — R262 Difficulty in walking, not elsewhere classified: Secondary | ICD-10-CM | POA: Diagnosis not present

## 2022-01-02 DIAGNOSIS — E1151 Type 2 diabetes mellitus with diabetic peripheral angiopathy without gangrene: Secondary | ICD-10-CM | POA: Diagnosis not present

## 2022-01-18 ENCOUNTER — Other Ambulatory Visit: Payer: Self-pay | Admitting: Family Medicine

## 2022-02-06 DIAGNOSIS — E1151 Type 2 diabetes mellitus with diabetic peripheral angiopathy without gangrene: Secondary | ICD-10-CM | POA: Diagnosis not present

## 2022-02-06 DIAGNOSIS — I739 Peripheral vascular disease, unspecified: Secondary | ICD-10-CM | POA: Diagnosis not present

## 2022-02-06 DIAGNOSIS — M7671 Peroneal tendinitis, right leg: Secondary | ICD-10-CM | POA: Diagnosis not present

## 2022-02-06 DIAGNOSIS — M722 Plantar fascial fibromatosis: Secondary | ICD-10-CM | POA: Diagnosis not present

## 2022-02-07 ENCOUNTER — Ambulatory Visit (INDEPENDENT_AMBULATORY_CARE_PROVIDER_SITE_OTHER): Payer: Medicare Other | Admitting: Family Medicine

## 2022-02-07 VITALS — BP 126/78 | HR 96 | Ht 66.0 in | Wt 281.0 lb

## 2022-02-07 DIAGNOSIS — M25562 Pain in left knee: Secondary | ICD-10-CM

## 2022-02-07 NOTE — Progress Notes (Signed)
Subjective:    Patient ID: Kenneth [name], male    DOB: April 21, 1960, 61 y.o.   MRN: [id number]  Knee Pain   Patient reports sudden onset of left knee pain for the last 2 days.  The left knee is swollen with a mild effusion.  There is no erythema or warmth.  He does report pain with standing and walking.  The pain is located over both medial and lateral joint line.  He denies any falls or injury.  He denies feeling a pop in his knee.  He had an x-ray in 2017 which showed minimal arthritis.  However he has not had significant pain since that time unlike his right knee.  He states the pain is significant today. Past Medical History:  Diagnosis Date   Arthritis    Asthma    Chronic bronchitis ([facility])    Cocaine abuse ([place])    GERD (gastroesophageal reflux disease)    Headache    Hyperlipidemia    Hypertension    OSA treated with BiPAP    ahi-132, bipap 23/19   Syncope    Tobacco abuse    Past Surgical History:  Procedure Laterality Date   ESOPHAGOGASTRODUODENOSCOPY (EGD) WITH PROPOFOL N/A 12/11/2012   Procedure: ESOPHAGOGASTRODUODENOSCOPY (EGD) WITH PROPOFOL;  Surgeon: Arta Silence, MD;  [facility];  Service: Endoscopy;  Laterality: N/A;   FINGER AMPUTATION     KNEE ARTHROSCOPY Left 11/15/2015   Procedure: ARTHROSCOPY KNEE AND MEDIAL MENISECTOMY;  Surgeon: Paralee Cancel, MD;  [facility];  Service: Orthopedics;  Laterality: Left;   KNEE ARTHROSCOPY WITH LATERAL MENISECTOMY Right 07/14/2020   Procedure: RIGHT KNEE PARTIAL LATERAL MENISCECTOMY;  Surgeon: Leandrew Koyanagi, MD;  Location: [place];  Service: Orthopedics;  Laterality: Right;   Current Outpatient Medications on File Prior to Visit  Medication Sig Dispense Refill   ACCU-CHEK GUIDE test strip USE AS DIRECTED TO MONITOR FSBS 2 TIMES A DAY 100 strip 1   Accu-Chek Softclix Lancets lancets Use as instructed 100 each 12   albuterol (VENTOLIN HFA) 108 (90 Base) MCG/ACT inhaler INHALE 2 PUFFS INTO THE LUNGS EVERY 4 HOURS AS  NEEDED FOR SHORTNESS OF BREATH 8.5 g 2   Allopurinol 200 MG TABS Take 200 mg by mouth daily. TAKE 1 TAB (200MG)  PO DAILY 90 tablet 3   amLODipine (NORVASC) 5 MG tablet TAKE 1 TABLET (5 MG TOTAL) BY MOUTH DAILY. 90 tablet 0   aspirin 81 MG chewable tablet Chew 81 mg by mouth in the morning.     atorvastatin (LIPITOR) 40 MG tablet TAKE 1 TABLET (40 MG TOTAL) BY MOUTH DAILY. 90 tablet 3   blood glucose meter kit and supplies KIT Dispense based on patient and insurance preference. Use up to four times daily as directed. 1 each 11   Blood Glucose Monitoring Suppl (ACCU-CHEK AVIVA PLUS) w/Device KIT Use as directed to monitor FSBS 2x daily. Dx: E11.9 1 kit 1   celecoxib (CELEBREX) 200 MG capsule Take 1 capsule (200 mg total) by mouth 2 (two) times daily. 20 capsule 0   cyclobenzaprine (FLEXERIL) 10 MG tablet Take 0.5-1 tablets (5-10 mg total) by mouth 2 (two) times daily as needed for muscle spasms. 20 tablet 0   doxycycline (VIBRAMYCIN) 100 MG capsule Take 1 capsule (100 mg total) by mouth 2 (two) times daily. One po bid x 7 days 14 capsule 0   empagliflozin (JARDIANCE) 25 MG TABS tablet Take 1 tablet (25 mg total) by mouth daily before breakfast.  30 tablet 5   Erenumab-aooe (AIMOVIG) 140 MG/ML SOAJ Inject 140 mg into the skin every 30 (thirty) days. 1.12 mL 11   gabapentin (NEURONTIN) 300 MG capsule TAKE 1 CAPSULE BY MOUTH 3 TIMES DAILY AS NEEDED (NERVE PAIN). 90 capsule 3   glucose blood test strip 1 each by Other route 2 (two) times daily. Use as instructed 100 each 12   HYDROcodone-acetaminophen (NORCO) 5-325 MG tablet Take 1-2 tablets by mouth 2 (two) times daily as needed. 20 tablet 0   linaclotide (LINZESS) 145 MCG CAPS capsule Take 1 capsule (145 mcg total) by mouth daily before breakfast. 30 capsule 5   meloxicam (MOBIC) 15 MG tablet Take 1 tablet (15 mg total) by mouth daily. 30 tablet 0   metFORMIN (GLUCOPHAGE) 1000 MG tablet TAKE 1 TABLET BY MOUTH 2 TIMES DAILY WITH A MEAL. 180 tablet 3    nitroGLYCERIN (NITROSTAT) 0.4 MG SL tablet Place 1 tablet (0.4 mg total) under the tongue every 5 (five) minutes as needed. For chest pain (Patient taking differently: Place 0.4 mg under the tongue every 5 (five) minutes x 3 doses as needed for chest pain.) 20 tablet 1   omeprazole (PRILOSEC) 40 MG capsule Take 1 capsule (40 mg total) by mouth daily. 90 capsule 3   predniSONE (DELTASONE) 20 MG tablet 3 tabs poqday 1-2, 2 tabs poqday 3-4, 1 tab poqday 5-6 12 tablet 0   senna-docusate (SENOKOT-S) 8.6-50 MG tablet Take 1 tablet by mouth daily as needed (constipation).     sulfamethoxazole-trimethoprim (BACTRIM DS) 800-160 MG tablet Take 1 tablet by mouth 2 (two) times daily. 14 tablet 0   topiramate (TOPAMAX) 25 MG tablet Take 2 tablets (50 mg total) by mouth 2 (two) times daily. 120 tablet 5   Current Facility-Administered Medications on File Prior to Visit  Medication Dose Route Frequency Provider Last Rate Last Admin   lactated ringers infusion    Continuous PRN Chyrel Masson, CRNA   New Bag at 11/15/15 1545   No Known Allergies Social History   Socioeconomic History   Marital status: Divorced    Spouse name: Not on file   Number of children: 0   Years of education: Not on file   Highest education level: Not on file  Occupational History   Not on file  Tobacco Use   Smoking status: Every Day    Packs/day: 0.50    Years: 40.00    Total pack years: 20.00    Types: Cigarettes   Smokeless tobacco: Never   Tobacco comments:    06/05/17 1 pack per 1 1/2 days  Substance and Sexual Activity   Alcohol use: Yes    Alcohol/week: 7.0 standard drinks of alcohol    Types: 7 Cans of beer per week   Drug use: Yes    Types: Cocaine    Comment: patient states none- read lab report 09/02/2012/ 11-20-12 case cx.due to positive screen test-pt. advised no recreational drugs to be used   Sexual activity: Not on file  Other Topics Concern   Not on file  Social History Narrative   Lives with  brother and his sister in [place].     Social Determinants of Health   Financial Resource Strain: Low Risk  (03/24/2021)   Overall Financial Resource Strain (CARDIA)    Difficulty of Paying Living Expenses: Not hard at all  Food Insecurity: No Food Insecurity (03/24/2021)   Hunger Vital Sign    Worried About Running Out of Food in the  Last Year: Never true    [place] in the Last Year: Never true  Transportation Needs: No Transportation Needs (03/24/2021)   PRAPARE - Hydrologist (Medical): No    Lack of Transportation (Non-Medical): No  Physical Activity: Insufficiently Active (03/24/2021)   Exercise Vital Sign    Days of Exercise per Week: 3 days    Minutes of Exercise per Session: 20 min  Stress: No Stress Concern Present (03/24/2021)   [place]    Feeling of Stress : Not at all  Social Connections: Socially Isolated (03/24/2021)   Social Connection and Isolation Panel [NHANES]    Frequency of Communication with Friends and Family: More than three times a week    Frequency of Social Gatherings with Friends and Family: More than three times a week    Attends Religious Services: Never    Marine scientist or Organizations: No    Attends Archivist Meetings: Never    Marital Status: Divorced  Human resources officer Violence: Not At Risk (03/24/2021)   Humiliation, Afraid, Rape, and Kick questionnaire    Fear of Current or Ex-Partner: No    Emotionally Abused: No    Physically Abused: No    Sexually Abused: No     Review of Systems  All other systems reviewed and are negative.      Objective:   Physical Exam Vitals reviewed.  Constitutional:      General: He is not in acute distress.    Appearance: He is obese. He is not ill-appearing, toxic-appearing or diaphoretic.  Cardiovascular:     Rate and Rhythm: Normal rate and regular rhythm.     Heart sounds: Normal  heart sounds.  Pulmonary:     Effort: Pulmonary effort is normal. No respiratory distress.     Breath sounds: Normal breath sounds. No wheezing, rhonchi or rales.  Musculoskeletal:     Left knee: Effusion present. No erythema. Decreased range of motion. Tenderness present over the medial joint line and lateral joint line. No MCL, LCL, ACL or PCL tenderness. No LCL laxity, MCL laxity or ACL laxity.Normal meniscus.     Right lower leg: No edema.     Left lower leg: No edema.  Neurological:     Mental Status: He is alert.           Assessment & Plan:  Acute pain of left knee Suspect the patient has left knee pain due to osteoarthritis.  Offered the patient meloxicam but he requests a cortisone injection.  Using sterile technique, I injected the left knee with 2 cc lidocaine, 2 cc of Marcaine, and 2 cc of 40 mg per mill Kenalog.  If the knee does not improve, I would recommend imaging of the knee to evaluate further

## 2022-02-21 ENCOUNTER — Other Ambulatory Visit: Payer: Self-pay | Admitting: Family Medicine

## 2022-03-08 ENCOUNTER — Ambulatory Visit (INDEPENDENT_AMBULATORY_CARE_PROVIDER_SITE_OTHER): Payer: Medicare Other | Admitting: Neurology

## 2022-03-08 ENCOUNTER — Encounter: Payer: Self-pay | Admitting: Neurology

## 2022-03-08 VITALS — BP 116/70 | HR 95 | Ht 66.0 in | Wt 281.0 lb

## 2022-03-08 DIAGNOSIS — G4733 Obstructive sleep apnea (adult) (pediatric): Secondary | ICD-10-CM

## 2022-03-08 NOTE — Progress Notes (Signed)
Subjective:    Patient ID: Kenneth [name] is a 61 y.o. male.  HPI    Interim history:   Kenneth [name] is a 61 year old right-handed gentleman with an underlying medical history of hyperlipidemia, coronary artery disease, reflux disease, morbid obesity, arthritis, chronic bronchitis in the setting of smoking, obesity hypoventilation syndrome and syncopal event, who presents for follow-up consultation of his obstructive sleep apnea, on BiPAP therapy. The patient is unaccompanied today and presents for his one year FU appointment. I last saw him on 03/08/2021, at which time he reported feeling stable. We proceeded with a sleep study for reevaluation.  He had a baseline sleep study on 04/10/2021 which showed an AHI of 26.9/h, O2 nadir 72%.  He was advised to continue with BiPAP therapy at home.    Today, 03/08/22: I reviewed his BiPAP compliance data from 02/05/2022 through 03/06/2022, which is a total of 30 days, during which time he used his machine 27 days with percent use days greater than 4 hours at 67%, indicating slightly suboptimal compliance, average usage for days on treatment of 6 hours and 47 minutes, residual AHI at goal at 0.6/h, leak on the higher side with the 95th percentile at 24.5 L/min on a BiPAP pressure of 21 over 17 cm.  He reports doing well, feeling stable, up-to-date with supplies through [facility].  He is motivated to continue with treatment, he got a new machine last year he states.  His Epworth sleepiness score is 5 out of 24.  The patient's allergies, current medications, family history, past medical history, past social history, past surgical history and problem list were reviewed and updated as appropriate.    Previously:  :  I saw him on 12/05/2016, at which time he was not fully compliant with his BiPAP.  He was bothered by significant arthritis pain.  He was encouraged to be compliant with his BiPAP.   He saw Ward Givens, NP, on 06/05/2017, at which time he was  better with his BiPAP compliance.  He was advised to follow-up in 6 months.  He saw Ward Givens, NP on 12/12/2017, at which time he was intermittent with his BiPAP compliance.  He was encouraged to be fully compliant.   He saw Ward Givens, NP on 12/16/2018, at which time he was better with his BiPAP compliance but not fully optimal.  He noticed mask leaking.  He recently had received new supplies however.   He saw Ward Givens, NP on 12/16/2019, at which time he was very good with his overall usage of the machine but not consistently on it for more than 4 hours.  He was advised to be fully compliant.   I reviewed his BiPAP compliance data from 02/04/2021 through 03/05/2021, which is a total of 30 days, during which time he used his machine 23 days, percent use days greater than 4 hours at 50%, indicating mildly suboptimal compliance, average usage for days on treatment of 4 hours and 49 minutes, residual AHI at goal at 0.8/h, leak acceptable with fluctuating numbers, 95th percentile of leak at 15.2 L/min, pressure of 21/17 cm.     I saw him on 01/18/2016, at which time he was poorly compliant with BiPAP therapy with a percentage of 23%. He had recent left knee surgery under Dr. Alvan Dame on 11/15/2015. He was still smoking and was taking hydrocodone as needed.    He was seen in the interim by Ward Givens, NP, on 05/22/16, at which time his compliance was a little  better in the lower 40s but he was again advised to be fully compliant with treatment.    I reviewed his BiPAP compliance data from 11/05/2016 through 12/04/2016 which is a total of 30 days, during which time he used his BiPAP 21 days with percent used days greater than 4 hours at 40% only, indicating poor compliance with an average usage of 4 hours and 2 minutes for days on treatment, average AHI 1.3 per hour which is at goal, leak low with the 95th percentile at 1 L per minute, pressure of 29/19 cm.   I reviewed his BiPAP compliance data  from 10/21/2016 through 11/19/2016, which is a total of 30 days, during which time he used his BiPAP only 21 days with percent used days greater than 4 hours at only 30%, indicating poor compliance, average usage of 3 hours and 40 minutes 4 days on treatment, average AHI at goal at 1.4 per hour, leak on the low side with the 95th percentile at 2.2 L/m on a pressure of 23/19 cm. In the past 90 days his compliance percentage was similar at 36%.    I saw him on 05/10/2015, at which time he was not fully compliant with BiPAP therapy. He reported needing new supplies. He was still smoking. He had lost about 10 pounds in 12 months. I asked him to be fully compliant with BiPAP therapy.: He reports needing new supplies. He admits to not using his machine very much. Has no issues using it though. He sleeps better with it, he admits. He still smokes. He has lost a little bit of weight. Between February 2016 and February this year, he has lost about 10 pounds. We talked about his BiPAP titration study from 10/15/2014.   I reviewed his BiPAP compliance data from 12/18/2015 through 01/16/2016, which is a total of 30 days, during which time he used his BiPAP only 18 days with percent used days greater than 4 hours at 23%, indicating poor compliance with an average daily usage of only 2 hours and 15 minutes, residual AHI 1 per hour, leak low with the 95th percentile at 7.7 L/m on a pressure of 23/19 cm. In the past 90 days his compliance percentage was 24%.   I saw him on 07/30/2014, at which time we talked about his auto BiPAP therapy. He was compliant with treatment but I suggested we bring him back for a full night titration study to optimize treatment setting and determine a set pressure. He had a CPAP titration study on 08/04/2014, this was followed by a BiPAP titration study on 10/15/2014. I went over his test results with him in detail today. His CPAP titration study from 08/04/2014 showed that his sleep apnea was not  appropriately treated with CPAP. He was then switched to BiPAP therapy but an optimal pressure was not determined during that study. CPAP was titrated from 5 cm to 16 cm but his AHI was 20 per hour while on CPAP. BiPAP was started at 18/14 and titrated to 22/18 but he had ongoing respiratory events, a brief trial of BiPAP ST was not successful during that study. Sleep efficiency was 79.4%. Sleep latency 7 minutes, wake after sleep onset was 89 minutes with mild to moderate sleep fragmentation noted. Average oxygen saturation was 92%, nadir was 83%. Based on unsuccessful treatment with CPAP therapy and suboptimal treatment with BiPAP therapy during the study I asked him to return for another full night BiPAP titration study. He had this on 10/15/2014. Sleep  efficiency was 83.8%, latency to sleep 6 minutes, wake after sleep onset was 63 minutes with moderate sleep fragmentation noted. He had an elevated arousal index. He had increased percentages of stage I and stage II sleep, absence of slow-wave sleep and a REM percentage of 17.9% with a reduced REM latency of 24 minutes. He had mild PLMS with an index of 10.2 per hour, resulting in 3.9 arousals per hour. BiPAP was started at 8 over 4 cm and titrated to 23/19 cm. AHI was 0 per hour at that pressure. Supplemental oxygen was not utilized. On the final pressure, his desaturation nadir was 90%, supine REM sleep was achieved during the study but not on the final pressure. He indicated that he slept better than usual. Based on his test results are prescribed BiPAP therapy for home use with a setting of 29/19 cm via full facemask.   I reviewed his BiPAP compliance data from 04/05/2015 through 05/04/2015 which is a total of 30 days during which time he used his machine only 8 days with percent used days greater than 4 hours at 13% only, indicating poor compliance with an average usage of 1 hour and 17 minutes only. Residual AHI 3.1 per hour, leak low. Set pressure of  23/19 cm.   I first met him on 05/22/2014 at the request of his primary care physician, at which time the patient reported snoring and daytime somnolence. I suggested he return for sleep study. He had a split-night sleep study on 05/25/2014 and went over his test results with him in detail today. Baseline sleep efficiency was 77.3% with a latency to sleep of 0 minutes and wake after sleep onset of 35.5 minutes with severe sleep fragmentation noted. He had an elevated arousal index. He had absence of slow-wave sleep and absence of REM sleep prior to CPAP. He had had rare pauses on his EKG, about 3 seconds long. He had moderate to loud snoring. His total AHI was 119 per hour. This line oxygen saturation was 89%, nadir was 76%. He was then titrated on CPAP from 5-15 cm and then placed on BiPAP therapy and titrated from 16/12 cm to 24/20 cm. He did not have resolution of his sleep disordered breathing while on CPAP or BiPAP standard. I suggested Auto BiPAP at home and also suggested he undergo pulse oximetry testing while on BiPAP therapy at home.    I reviewed his BiPAP compliance data from 06/28/2014 through 07/27/2014 which is a total of 30 days during which time he used his machine only 22 days. Percent used days greater than 4 hours was 70%, indicating borderline/adequate compliance. Average usage for all nights was 4 hours and 54 minutes. He is on BiPAP with maximum IPAP of 25 cm, minimum EPAP of 14 cm and pressure support of 4. Average AHI was 3.3 per hour, adequate. Leak was acceptable with the 95th percentile at 13.1 L/m. 95th percentile of pressure appears to be 21/17.   I spoke with his cardiologist on the phone on 05/19/14; the patient was having some 5-6 s pauses. His Holter monitor was placed earlier this month. On 04/30/2014 he had a syncopal spell. He was in the emergency room for this. He has seen Dr. Percival Spanish for cardiac workup. He has been advised to have a sleep study. He snores, he wakes up not  refreshed and he has multiple nighttime awakenings and nocturia at least 4-5 times per night. His Epworth sleepiness score is 19 out of 24. He goes to  bed around 9 PM and while he falls asleep fairly quickly he wakes up almost every hour he says. Sometimes he gets out of bed around 3 AM and never goes back to sleep. He denies restless leg symptoms but has some aching in his legs. He has lower extremity swelling. He wakes up with a headache at times. He is not aware of any family history of obstructive sleep apnea. He has never had a sleep study. He has been overweight for years. He snores and this is at times quite loud. [name] not sure if he actually quits breathing in his sleep but has woken up with a sense of gasping for air. He drinks about 2 Coca-Cola as per day. He does not drink coffee. He occasionally drinks alcohol. He smokes one pack per day.   He lives with his brother and his sister-in-law. He is divorced. He does not have any children. There are no pets in his bed. Occasionally he watches TV in bed. He does not work.      His Past Medical History Is Significant For: Past Medical History:  Diagnosis Date   Arthritis    Asthma    Chronic bronchitis (HCC)    Cocaine abuse ([place])    GERD (gastroesophageal reflux disease)    Headache    Hyperlipidemia    Hypertension    OSA treated with BiPAP    ahi-132, bipap 23/19   Syncope    Tobacco abuse     His Past Surgical History Is Significant For: Past Surgical History:  Procedure Laterality Date   ESOPHAGOGASTRODUODENOSCOPY (EGD) WITH PROPOFOL N/A 12/11/2012   Procedure: ESOPHAGOGASTRODUODENOSCOPY (EGD) WITH PROPOFOL;  Surgeon: Arta Silence, MD;  Location: [facility];  Service: Endoscopy;  Laterality: N/A;   FINGER AMPUTATION     KNEE ARTHROSCOPY Left 11/15/2015   Procedure: ARTHROSCOPY KNEE AND MEDIAL MENISECTOMY;  Surgeon: Paralee Cancel, MD;  Location: [facility];  Service: Orthopedics;  Laterality: Left;   KNEE ARTHROSCOPY WITH LATERAL  MENISECTOMY Right 07/14/2020   Procedure: RIGHT KNEE PARTIAL LATERAL MENISCECTOMY;  Surgeon: Leandrew Koyanagi, MD;  Location: [place];  Service: Orthopedics;  Laterality: Right;    His Family History Is Significant For: Family History  Problem Relation Age of Onset   Hypertension Father    Diabetes Father    Diabetes Sister    Stroke Sister    Diabetes Brother    Diabetes Other        Multiple family members   Sleep apnea Neg Hx     His Social History Is Significant For: Social History   Socioeconomic History   Marital status: Divorced    Spouse name: Not on file   Number of children: 0   Years of education: Not on file   Highest education level: Not on file  Occupational History   Not on file  Tobacco Use   Smoking status: Every Day    Packs/day: 0.50    Years: 40.00    Total pack years: 20.00    Types: Cigarettes   Smokeless tobacco: Never   Tobacco comments:    06/05/17 1 pack per 1 1/2 days  Substance and Sexual Activity   Alcohol use: Yes    Alcohol/week: 7.0 standard drinks of alcohol    Types: 7 Cans of beer per week   Drug use: Yes    Types: Cocaine    Comment: patient states none- read lab report 09/02/2012/ 11-20-12 case cx.due to positive screen test-pt. advised no recreational drugs  to be used   Sexual activity: Not on file  Other Topics Concern   Not on file  Social History Narrative   Lives with brother and his sister in [place].     Social Determinants of Health   Financial Resource Strain: Low Risk  (03/24/2021)   Overall Financial Resource Strain (CARDIA)    Difficulty of Paying Living Expenses: Not hard at all  Food Insecurity: No Food Insecurity (03/24/2021)   Hunger Vital Sign    Worried About Running Out of Food in the Last Year: Never true    Ran Out of Food in the Last Year: Never true  Transportation Needs: No Transportation Needs (03/24/2021)   PRAPARE - Hydrologist (Medical): No    Lack of Transportation  (Non-Medical): No  Physical Activity: Insufficiently Active (03/24/2021)   Exercise Vital Sign    Days of Exercise per Week: 3 days    Minutes of Exercise per Session: 20 min  Stress: No Stress Concern Present (03/24/2021)   [place]    Feeling of Stress : Not at all  Social Connections: Socially Isolated (03/24/2021)   Social Connection and Isolation Panel [NHANES]    Frequency of Communication with Friends and Family: More than three times a week    Frequency of Social Gatherings with Friends and Family: More than three times a week    Attends Religious Services: Never    Marine scientist or Organizations: No    Attends Archivist Meetings: Never    Marital Status: Divorced    His Allergies Are:  No Known Allergies:   His Current Medications Are:  Outpatient Encounter Medications as of 03/08/2022  Medication Sig   ACCU-CHEK GUIDE test strip USE AS DIRECTED TO MONITOR FSBS 2 TIMES A DAY   Accu-Chek Softclix Lancets lancets Use as instructed   albuterol (VENTOLIN HFA) 108 (90 Base) MCG/ACT inhaler INHALE 2 PUFFS INTO THE LUNGS EVERY 4 HOURS AS NEEDED FOR SHORTNESS OF BREATH   Allopurinol 200 MG TABS Take 200 mg by mouth daily. TAKE 1 TAB (200MG)  PO DAILY   amLODipine (NORVASC) 5 MG tablet TAKE 1 TABLET (5 MG TOTAL) BY MOUTH DAILY.   aspirin 81 MG chewable tablet Chew 81 mg by mouth in the morning.   atorvastatin (LIPITOR) 40 MG tablet TAKE 1 TABLET (40 MG TOTAL) BY MOUTH DAILY.   blood glucose meter kit and supplies KIT Dispense based on patient and insurance preference. Use up to four times daily as directed.   Blood Glucose Monitoring Suppl (ACCU-CHEK AVIVA PLUS) w/Device KIT Use as directed to monitor FSBS 2x daily. Dx: E11.9   celecoxib (CELEBREX) 200 MG capsule Take 1 capsule (200 mg total) by mouth 2 (two) times daily.   cyclobenzaprine (FLEXERIL) 10 MG tablet Take 0.5-1 tablets (5-10 mg  total) by mouth 2 (two) times daily as needed for muscle spasms.   empagliflozin (JARDIANCE) 25 MG TABS tablet Take 1 tablet (25 mg total) by mouth daily before breakfast.   Erenumab-aooe (AIMOVIG) 140 MG/ML SOAJ Inject 140 mg into the skin every 30 (thirty) days.   gabapentin (NEURONTIN) 300 MG capsule TAKE 1 CAPSULE BY MOUTH 3 TIMES DAILY AS NEEDED (NERVE PAIN).   glucose blood test strip 1 each by Other route 2 (two) times daily. Use as instructed   HYDROcodone-acetaminophen (NORCO) 5-325 MG tablet Take 1-2 tablets by mouth 2 (two) times daily as needed.  linaclotide (LINZESS) 145 MCG CAPS capsule Take 1 capsule (145 mcg total) by mouth daily before breakfast.   meloxicam (MOBIC) 15 MG tablet Take 1 tablet (15 mg total) by mouth daily.   metFORMIN (GLUCOPHAGE) 1000 MG tablet TAKE 1 TABLET BY MOUTH 2 TIMES DAILY WITH A MEAL.   nitroGLYCERIN (NITROSTAT) 0.4 MG SL tablet Place 1 tablet (0.4 mg total) under the tongue every 5 (five) minutes as needed. For chest pain (Patient taking differently: Place 0.4 mg under the tongue every 5 (five) minutes x 3 doses as needed for chest pain.)   omeprazole (PRILOSEC) 40 MG capsule Take 1 capsule (40 mg total) by mouth daily.   senna-docusate (SENOKOT-S) 8.6-50 MG tablet Take 1 tablet by mouth daily as needed (constipation).   topiramate (TOPAMAX) 25 MG tablet Take 2 tablets (50 mg total) by mouth 2 (two) times daily.   [DISCONTINUED] doxycycline (VIBRAMYCIN) 100 MG capsule Take 1 capsule (100 mg total) by mouth 2 (two) times daily. One po bid x 7 days   [DISCONTINUED] predniSONE (DELTASONE) 20 MG tablet 3 tabs poqday 1-2, 2 tabs poqday 3-4, 1 tab poqday 5-6   [DISCONTINUED] sulfamethoxazole-trimethoprim (BACTRIM DS) 800-160 MG tablet Take 1 tablet by mouth 2 (two) times daily.   Facility-Administered Encounter Medications as of 03/08/2022  Medication   lactated ringers infusion  :  Review of Systems:  Out of a complete 14 point review of systems, all are  reviewed and negative with the exception of these symptoms as listed below:  Review of Systems  Neurological:        CPAP follow up.  ESS 5.  Pt has not concerns.  No changes medically, surgically or family hx per pt.     Objective:  Neurological Exam  Physical Exam Physical Examination:   Vitals:   03/08/22 1029  BP: 116/70  Pulse: 95    General Examination: The patient is a very pleasant 61 y.o. male in no acute distress. He appears well-developed and well-nourished and well groomed.   HEENT: Normocephalic, atraumatic, pupils are equal, round and reactive to light, tracking well-preserved, hearing grossly intact.  Face is symmetric with normal facial animation, speech is clear without dysarthria, hypophonia or voice tremor.  Neck with full range of motion, no carotid bruits. Oropharynx exam reveals: mild to moderate mouth dryness, adequate dental hygiene and marked airway crowding. Mallampati is class III. Tongue protrudes centrally and palate elevates symmetrically.     Chest: Clear to auscultation without wheezing, rhonchi or crackles noted.   Heart: S1+S2+0, regular and normal without murmurs, rubs or gallops noted.    Abdomen: Soft, non-tender and non-distended.   Extremities: There is no pitting edema in the distal lower extremities bilaterally.    Skin: Dry appearing.   Musculoskeletal: exam reveals no obvious joint deformities.    Neurologically:  Mental status: The patient is awake, alert and oriented in all 4 spheres. His immediate and remote memory, attention, language skills and fund of knowledge are appropriate. There is no evidence of aphasia, agnosia, apraxia or anomia. Speech is clear with normal prosody and enunciation. Thought process is linear. Mood is normal and affect is normal.  Cranial nerves II - XII are as described above under HEENT exam.  Motor exam: Normal bulk, strength and tone is noted. There is no tremor. Fine motor skills and coordination:  grossly intact. Cerebellar testing: No dysmetria or intention tremor. There is no truncal or gait ataxia.  Sensory exam: intact to light touch in the upper  and lower extremities.  Gait, station and balance: He stands without difficulty, and walks slowly, no cane.              Assessment and Plan:   In summary, Kenneth [name] is a very pleasant 61 year old male with an underlying complex medical history of hyperlipidemia, coronary artery disease, reflux disease, morbid obesity, arthritis, chronic bronchitis in the setting of smoking, obesity hypoventilation syndrome, syncope (seen by cardiology), who presents for follow-up consultation of his obstructive sleep apnea.  He has been on BiPAP therapy.  We did a repeat sleep study in January 2023.  He has had some fluctuation with his compliance, apnea generally speaking under very good control on a fairly high pressure of standard BiPAP therapy but he tolerates the pressure.  He is encouraged to continue with full compliance.  He is advised to continue to work on weight loss.  He is advised to follow-up routinely in this clinic to see one of our nurse practitioners in 1 year.  I answered all his questions today and he was in agreement.    I spent 20 minutes in total face-to-face time and in reviewing records during pre-charting, more than 50% of which was spent in counseling and coordination of care, reviewing test results, reviewing medications and treatment regimen and/or in discussing or reviewing the diagnosis of OSA, the prognosis and treatment options. Pertinent laboratory and imaging test results that were available during this visit with the patient were reviewed by me and considered in my medical decision making (see chart for details).

## 2022-03-08 NOTE — Patient Instructions (Addendum)
Please continue using your BiPAP regularly. While your insurance requires that you use BiPAP at least 4 hours each night on 70% of the nights, I recommend, that you not skip any nights and use it throughout the night if you can. Getting used to BiPAP and staying with the treatment long term does take time and patience and discipline. Untreated obstructive sleep apnea when it is moderate to severe can have an adverse impact on cardiovascular health and raise her risk for heart disease, arrhythmias, hypertension, congestive heart failure, stroke and diabetes. Untreated obstructive sleep apnea causes sleep disruption, nonrestorative sleep, and sleep deprivation. This can have an impact on your day to day functioning and cause daytime sleepiness and impairment of cognitive function, memory loss, mood disturbance, and problems focussing. Using BiPAP regularly can improve these symptoms.  We can see you in 1 year, you can see one of our nurse practitioners as you are stable.

## 2022-03-28 DIAGNOSIS — G4733 Obstructive sleep apnea (adult) (pediatric): Secondary | ICD-10-CM | POA: Diagnosis not present

## 2022-03-29 NOTE — Progress Notes (Signed)
Subjective:   Kenneth [name] is a 62 y.o. male who presents for Medicare Annual/Subsequent preventive examination.  I connected with  Kenneth [name] on 03/29/22 by a audio enabled telemedicine application and verified that I am speaking with the correct person using two identifiers.  Patient Location: Home  Provider Location: Office/Clinic  I discussed the limitations of evaluation and management by telemedicine. The patient expressed understanding and agreed to proceed.  Review of Systems           Objective:    There were no vitals filed for this visit. There is no height or weight on file to calculate BMI.     06/15/2021    3:54 PM 03/24/2021    2:50 PM 07/30/2020    6:00 AM 07/14/2020   11:59 AM 05/08/2020    8:28 AM 02/17/2020    5:21 PM 11/10/2015   12:39 PM  Advanced Directives  Does Patient Have a Medical Advance Directive? No No No No No No Yes  Type of Tax inspector;Living will  Copy of Healthcare Power of Attorney in Chart?       No - copy requested  Would patient like information on creating a medical advance directive? No - Patient declined No - Patient declined No - Patient declined No - Patient declined No - Patient declined No - Patient declined     Current Medications (verified) Outpatient Encounter Medications as of 03/30/2022  Medication Sig   ACCU-CHEK GUIDE test strip USE AS DIRECTED TO MONITOR FSBS 2 TIMES A DAY   Accu-Chek Softclix Lancets lancets Use as instructed   albuterol (VENTOLIN HFA) 108 (90 Base) MCG/ACT inhaler INHALE 2 PUFFS INTO THE LUNGS EVERY 4 HOURS AS NEEDED FOR SHORTNESS OF BREATH   Allopurinol 200 MG TABS Take 200 mg by mouth daily. TAKE 1 TAB (200MG )  PO DAILY   amLODipine (NORVASC) 5 MG tablet TAKE 1 TABLET (5 MG TOTAL) BY MOUTH DAILY.   aspirin 81 MG chewable tablet Chew 81 mg by mouth in the morning.   atorvastatin (LIPITOR) 40 MG tablet TAKE 1 TABLET (40 MG TOTAL) BY MOUTH DAILY.   blood  glucose meter kit and supplies KIT Dispense based on patient and insurance preference. Use up to four times daily as directed.   Blood Glucose Monitoring Suppl (ACCU-CHEK AVIVA PLUS) w/Device KIT Use as directed to monitor FSBS 2x daily. Dx: E11.9   celecoxib (CELEBREX) 200 MG capsule Take 1 capsule (200 mg total) by mouth 2 (two) times daily.   cyclobenzaprine (FLEXERIL) 10 MG tablet Take 0.5-1 tablets (5-10 mg total) by mouth 2 (two) times daily as needed for muscle spasms.   empagliflozin (JARDIANCE) 25 MG TABS tablet Take 1 tablet (25 mg total) by mouth daily before breakfast.   Erenumab-aooe (AIMOVIG) 140 MG/ML SOAJ Inject 140 mg into the skin every 30 (thirty) days.   gabapentin (NEURONTIN) 300 MG capsule TAKE 1 CAPSULE BY MOUTH 3 TIMES DAILY AS NEEDED (NERVE PAIN).   glucose blood test strip 1 each by Other route 2 (two) times daily. Use as instructed   HYDROcodone-acetaminophen (NORCO) 5-325 MG tablet Take 1-2 tablets by mouth 2 (two) times daily as needed.   linaclotide (LINZESS) 145 MCG CAPS capsule Take 1 capsule (145 mcg total) by mouth daily before breakfast.   meloxicam (MOBIC) 15 MG tablet Take 1 tablet (15 mg total) by mouth daily.   metFORMIN (GLUCOPHAGE) 1000 MG tablet TAKE 1  TABLET BY MOUTH 2 TIMES DAILY WITH A MEAL.   nitroGLYCERIN (NITROSTAT) 0.4 MG SL tablet Place 1 tablet (0.4 mg total) under the tongue every 5 (five) minutes as needed. For chest pain (Patient taking differently: Place 0.4 mg under the tongue every 5 (five) minutes x 3 doses as needed for chest pain.)   omeprazole (PRILOSEC) 40 MG capsule Take 1 capsule (40 mg total) by mouth daily.   senna-docusate (SENOKOT-S) 8.6-50 MG tablet Take 1 tablet by mouth daily as needed (constipation).   topiramate (TOPAMAX) 25 MG tablet Take 2 tablets (50 mg total) by mouth 2 (two) times daily.   Facility-Administered Encounter Medications as of 03/30/2022  Medication   lactated ringers infusion    Allergies  (verified) Patient has no known allergies.   History: Past Medical History:  Diagnosis Date   Arthritis    Asthma    Chronic bronchitis (HCC)    Cocaine abuse (HCC)    GERD (gastroesophageal reflux disease)    Headache    Hyperlipidemia    Hypertension    OSA treated with BiPAP    ahi-132, bipap 23/19   Syncope    Tobacco abuse    Past Surgical History:  Procedure Laterality Date   ESOPHAGOGASTRODUODENOSCOPY (EGD) WITH PROPOFOL N/A 12/11/2012   Procedure: ESOPHAGOGASTRODUODENOSCOPY (EGD) WITH PROPOFOL;  Surgeon: Willis Modena, MD;  Location: [facility];  Service: Endoscopy;  Laterality: N/A;   FINGER AMPUTATION     KNEE ARTHROSCOPY Left 11/15/2015   Procedure: ARTHROSCOPY KNEE AND MEDIAL MENISECTOMY;  Surgeon: Durene Romans, MD;  Location: [facility];  Service: Orthopedics;  Laterality: Left;   KNEE ARTHROSCOPY WITH LATERAL MENISECTOMY Right 07/14/2020   Procedure: RIGHT KNEE PARTIAL LATERAL MENISCECTOMY;  Surgeon: Tarry Kos, MD;  Location: [facility];  Service: Orthopedics;  Laterality: Right;   Family History  Problem Relation Age of Onset   Hypertension Father    Diabetes Father    Diabetes Sister    Stroke Sister    Diabetes Brother    Diabetes Other        Multiple family members   Sleep apnea Neg Hx    Social History   Socioeconomic History   Marital status: Divorced    Spouse name: Not on file   Number of children: 0   Years of education: Not on file   Highest education level: Not on file  Occupational History   Not on file  Tobacco Use   Smoking status: Every Day    Packs/day: 0.50    Years: 40.00    Total pack years: 20.00    Types: Cigarettes   Smokeless tobacco: Never   Tobacco comments:    06/05/17 1 pack per 1 1/2 days  Substance and Sexual Activity   Alcohol use: Yes    Alcohol/week: 7.0 standard drinks of alcohol    Types: 7 Cans of beer per week   Drug use: Yes    Types: Cocaine    Comment: patient states none- read lab report 09/02/2012/  11-20-12 case cx.due to positive screen test-pt. advised no recreational drugs to be used   Sexual activity: Not on file  Other Topics Concern   Not on file  Social History Narrative   Lives with brother and his sister in [place].     Social Determinants of Health   Financial Resource Strain: Low Risk  (03/24/2021)   Overall Financial Resource Strain (CARDIA)    Difficulty of Paying Living Expenses: Not hard at all  Food Insecurity:  No Food Insecurity (03/24/2021)   Hunger Vital Sign    Worried About Running Out of Food in the Last Year: Never true    Ran Out of Food in the Last Year: Never true  Transportation Needs: No Transportation Needs (03/24/2021)   PRAPARE - Administrator, Civil Service (Medical): No    Lack of Transportation (Non-Medical): No  Physical Activity: Insufficiently Active (03/24/2021)   Exercise Vital Sign    Days of Exercise per Week: 3 days    Minutes of Exercise per Session: 20 min  Stress: No Stress Concern Present (03/24/2021)   [employer] of Occupational Health - Occupational Stress Questionnaire    Feeling of Stress : Not at all  Social Connections: Socially Isolated (03/24/2021)   Social Connection and Isolation Panel [NHANES]    Frequency of Communication with Friends and Family: More than three times a week    Frequency of Social Gatherings with Friends and Family: More than three times a week    Attends Religious Services: Never    Database administrator or Organizations: No    Attends Banker Meetings: Never    Marital Status: Divorced    Tobacco Counseling Ready to quit: Not Answered Counseling given: Not Answered Tobacco comments: 06/05/17 1 pack per 1 1/2 days   Clinical Intake:                 Diabetic?Yes  Nutrition Risk Assessment:  Has the patient had any N/V/D within the last 2 months?  No  Does the patient have any non-healing wounds?  No  Has the patient had any unintentional weight loss  or weight gain?  No   Diabetes:  Is the patient diabetic?  Yes  If diabetic, was a CBG obtained today?  No  Did the patient bring in their glucometer from home?  No  How often do you monitor your CBG's? Daily .   Financial Strains and Diabetes Management:  Are you having any financial strains with the device, your supplies or your medication? No .  Does the patient want to be seen by Chronic Care Management for management of their diabetes?  No  Would the patient like to be referred to a Nutritionist or for Diabetic Management?  No   Diabetic Exams:  Diabetic Eye Exam: Completed 04/18/21 Diabetic Foot Exam: Completed 07/25/21          Activities of Daily Living     No data to display          Patient Care Team: Donita Brooks, MD as PCP - General (Family Medicine) Jens Som, Madolyn Frieze, MD as PCP - Cardiology (Cardiology) Erroll Kenneth, [facility] as Pharmacist (Pharmacist) Diona Foley, MD as Consulting Physician (Ophthalmology) Huston Foley, MD as Consulting Physician (Neurology) Larey Dresser, DPM as Consulting Physician (Podiatry)  Indicate any recent Medical Services you may have received from other than [facility] providers in the past year (date may be approximate).     Assessment:   This is a routine wellness examination for Sufyaan.  Hearing/Vision screen No results found.  Dietary issues and exercise activities discussed:     Goals Addressed   None    Depression Screen    02/07/2022    3:49 PM 11/22/2021   12:06 PM 03/24/2021    2:47 PM 12/24/2020    1:56 PM 11/03/2019   12:16 PM 05/22/2017    2:15 PM 11/14/2016    2:11 PM  PHQ 2/9 Scores  PHQ - 2 Score 0 0 0 0 0 0 0    Fall Risk    02/07/2022    3:49 PM 11/22/2021   12:06 PM 03/24/2021    2:50 PM 12/24/2020    1:56 PM 06/05/2017   10:03 AM  Fall Risk   Falls in the past year? 0 0 0 0 No  Number falls in past yr: 0 0 0 0   Injury with Fall? 0 0 0 0   Risk for fall due to : No Fall Risks No  Fall Risks No Fall Risks No Fall Risks   Follow up Falls prevention discussed Falls prevention discussed Falls prevention discussed Falls evaluation completed     FALL RISK PREVENTION PERTAINING TO THE HOME:  Any stairs in or around the home? No  If so, are there any without handrails? No  Home free of loose throw rugs in walkways, pet beds, electrical cords, etc? Yes  Adequate lighting in your home to reduce risk of falls? Yes   ASSISTIVE DEVICES UTILIZED TO PREVENT FALLS:  Life alert? No  Use of a cane, walker or w/c? No  Grab bars in the bathroom? Yes  Shower chair or bench in shower? No  Elevated toilet seat or a handicapped toilet? Yes   TIMED UP AND GO:  Was the test performed? No . Telephonic visit   Cognitive Function:        03/24/2021    2:52 PM  6CIT Screen  What Year? 0 points  What month? 0 points  What time? 0 points  Count back from 20 0 points  Months in reverse 4 points  Repeat phrase 0 points  Total Score 4 points    Immunizations Immunization History  Administered Date(s) Administered   Influenza,inj,Quad PF,6+ Mos 12/25/2018, 01/26/2020, 12/21/2020   Moderna Sars-Covid-2 Vaccination 05/25/2019, 06/28/2019   Pneumococcal Polysaccharide-23 08/25/2016   Tdap 08/25/2016, 06/15/2021    TDAP status: Up to date  Flu Vaccine status: Declined, Education has been provided regarding the importance of this vaccine but patient still declined. Advised may receive this vaccine at local pharmacy or Health Dept. Aware to provide a copy of the vaccination record if obtained from local pharmacy or Health Dept. Verbalized acceptance and understanding.  Pneumococcal vaccine status: Up to date  Covid-19 vaccine status: Information provided on how to obtain vaccines.   Qualifies for Shingles Vaccine? Yes   Zostavax completed No   Shingrix Completed?: No.    Education has been provided regarding the importance of this vaccine. Patient has been advised to call  insurance company to determine out of pocket expense if they have not yet received this vaccine. Advised may also receive vaccine at local pharmacy or Health Dept. Verbalized acceptance and understanding.  Screening Tests Health Maintenance  Topic Date Due   Diabetic kidney evaluation - Urine ACR  Never done   Hepatitis C Screening  Never done   Zoster Vaccines- Shingrix (1 of 2) Never done   INFLUENZA VACCINE  10/25/2021   COVID-19 Vaccine (3 - 2023-24 season) 11/25/2021   HEMOGLOBIN A1C  01/25/2022   Medicare Annual Wellness (AWV)  03/24/2022   OPHTHALMOLOGY EXAM  04/25/2022   Lung Cancer Screening  06/16/2022   Diabetic kidney evaluation - eGFR measurement  07/26/2022   FOOT EXAM  07/26/2022   COLONOSCOPY (Pts 45-11yrs Insurance coverage will need to be confirmed)  03/27/2026   DTaP/Tdap/Td (3 - Td or Tdap) 06/16/2031   HIV Screening  Completed   HPV  VACCINES  Aged Out    Health Maintenance  Health Maintenance Due  Topic Date Due   Diabetic kidney evaluation - Urine ACR  Never done   Hepatitis C Screening  Never done   Zoster Vaccines- Shingrix (1 of 2) Never done   INFLUENZA VACCINE  10/25/2021   COVID-19 Vaccine (3 - 2023-24 season) 11/25/2021   HEMOGLOBIN A1C  01/25/2022   Medicare Annual Wellness (AWV)  03/24/2022    Colorectal cancer screening: Type of screening: Colonoscopy. Completed 03/27/16. Repeat every 10 years  Lung Cancer Screening: (Low Dose CT Chest recommended if Age 14-80 years, 30 pack-year currently smoking OR have quit w/in 15years.) does qualify.   Lung Cancer Screening Referral: Last done 06/15/21  Additional Screening:  Hepatitis C Screening: does qualify; Completed at next office visit   Vision Screening: Recommended annual ophthalmology exams for early detection of glaucoma and other disorders of the eye. Is the patient up to date with their annual eye exam?  Yes  Who is the provider or what is the name of the office in which the patient attends  annual eye exams? Dr. Zenaida Niece  If pt is not established with a provider, would they like to be referred to a provider to establish care? No .   Dental Screening: Recommended annual dental exams for proper oral hygiene  Community Resource Referral / Chronic Care Management: CRR required this visit?  No   CCM required this visit?   Currently receiving services      Plan:     I have personally reviewed and noted the following in the patient's chart:   Medical and social history Use of alcohol, tobacco or illicit drugs  Current medications and supplements including opioid prescriptions. Patient is not currently taking opioid prescriptions. Functional ability and status Nutritional status Physical activity Advanced directives List of other physicians Hospitalizations, surgeries, and ER visits in previous 12 months Vitals Screenings to include cognitive, depression, and falls Referrals and appointments  In addition, I have reviewed and discussed with patient certain preventive protocols, quality metrics, and best practice recommendations. A written personalized care plan for preventive services as well as general preventive health recommendations were provided to patient.     Durwin Nora, [place]   10/28/6960   Due to this being a virtual visit, the after visit summary with patients personalized plan was offered to patient via mail or my-chart. Patient would like to access on my-chart  Nurse Notes: No concerns

## 2022-03-29 NOTE — Patient Instructions (Signed)
Kenneth [name] , Thank you for taking time to come for your Medicare Wellness Visit. I appreciate your ongoing commitment to your health goals. Please review the following plan we discussed and let me know if I can assist you in the future.   These are the goals we discussed:  Goals      Exercise 3x per week (30 min per time)     Continue to exercise and lose weight     Monitor and Manage My Blood Sugar-Diabetes Type 2     Timeframe:  Long-Range Goal Priority:  High Start Date: 08/13/20                            Expected End Date: 02/13/21                      Follow Up Date 11/24/20   - check blood sugar at prescribed times - check blood sugar before and after exercise - enter blood sugar readings and medication or insulin into daily log - take the blood sugar log to all doctor visits    Why is this important?   Checking your blood sugar at home helps to keep it from getting very high or very low.  Writing the results in a diary or log helps the doctor know how to care for you.  Your blood sugar log should have the time, date and the results.  Also, write down the amount of insulin or other medicine that you take.  Other information, like what you ate, exercise done and how you were feeling, will also be helpful.     Notes:   01/20/21 - Patient started on Jardiance prior to this visit.     Track and Manage My Blood Pressure-Hypertension     Timeframe:  Long-Range Goal Priority:  High Start Date:  10/14/20                           Expected End Date: 04/25/21                      Follow Up Date 01/24/21    - check blood pressure 3 times per week - choose a place to take my blood pressure (home, clinic or office, retail store) - write blood pressure results in a log or diary    Why is this important?   You won't feel high blood pressure, but it can still hurt your blood vessels.  High blood pressure can cause heart or kidney problems. It can also cause a stroke.  Making  lifestyle changes like losing a little weight or eating less salt will help.  Checking your blood pressure at home and at different times of the day can help to control blood pressure.  If the doctor prescribes medicine remember to take it the way the doctor ordered.  Call the office if you cannot afford the medicine or if there are questions about it.     Notes:         This is a list of the screening recommended for you and due dates:  Health Maintenance  Topic Date Due   Yearly kidney health urinalysis for diabetes  Never done   Hepatitis C Screening: USPSTF Recommendation to screen - Ages 87-79 yo.  Never done   Zoster (Shingles) Vaccine (1 of 2) Never done   Flu Shot  10/25/2021   COVID-19 Vaccine (3 - 2023-24 season) 11/25/2021   Hemoglobin A1C  01/25/2022   Medicare Annual Wellness Visit  03/24/2022   Eye exam for diabetics  04/25/2022   Screening for Lung Cancer  06/16/2022   Yearly kidney function blood test for diabetes  07/26/2022   Complete foot exam   07/26/2022   Colon Cancer Screening  03/27/2026   DTaP/Tdap/Td vaccine (3 - Td or Tdap) 06/16/2031   HIV Screening  Completed   HPV Vaccine  Aged Out    Advanced directives: ***  Conditions/risks identified: Aim for 30 minutes of exercise or brisk walking, 6-8 glasses of water, and 5 servings of fruits and vegetables each day.   Next appointment: Follow up in one year for your annual wellness visit   Preventive Care 40-64 Years, Male Preventive care refers to lifestyle choices and visits with your health care provider that can promote health and wellness. What does preventive care include? A yearly physical exam. This is also called an annual well check. Dental exams once or twice a year. Routine eye exams. Ask your health care provider how often you should have your eyes checked. Personal lifestyle choices, including: Daily care of your teeth and gums. Regular physical activity. Eating a healthy diet. Avoiding  tobacco and drug use. Limiting alcohol use. Practicing safe sex. Taking low-dose aspirin every day starting at age 57. What happens during an annual well check? The services and screenings done by your health care provider during your annual well check will depend on your age, overall health, lifestyle risk factors, and family history of disease. Counseling  Your health care provider may ask you questions about your: Alcohol use. Tobacco use. Drug use. Emotional well-being. Home and relationship well-being. Sexual activity. Eating habits. Work and work Statistician. Screening  You may have the following tests or measurements: Height, weight, and BMI. Blood pressure. Lipid and cholesterol levels. These may be checked every 5 years, or more frequently if you are over 52 years old. Skin check. Lung cancer screening. You may have this screening every year starting at age 57 if you have a 30-pack-year history of smoking and currently smoke or have quit within the past 15 years. Fecal occult blood test (FOBT) of the stool. You may have this test every year starting at age 41. Flexible sigmoidoscopy or colonoscopy. You may have a sigmoidoscopy every 5 years or a colonoscopy every 10 years starting at age 5. Prostate cancer screening. Recommendations will vary depending on your family history and other risks. Hepatitis C blood test. Hepatitis B blood test. Sexually transmitted disease (STD) testing. Diabetes screening. This is done by checking your blood sugar (glucose) after you have not eaten for a while (fasting). You may have this done every 1-3 years. Discuss your test results, treatment options, and if necessary, the need for more tests with your health care provider. Vaccines  Your health care provider may recommend certain vaccines, such as: Influenza vaccine. This is recommended every year. Tetanus, diphtheria, and acellular pertussis (Tdap, Td) vaccine. You may need a Td booster  every 10 years. Zoster vaccine. You may need this after age 51. Pneumococcal 13-valent conjugate (PCV13) vaccine. You may need this if you have certain conditions and have not been vaccinated. Pneumococcal polysaccharide (PPSV23) vaccine. You may need one or two doses if you smoke cigarettes or if you have certain conditions. Talk to your health care provider about which screenings and vaccines you need and how often you need them.  This information is not intended to replace advice given to you by your health care provider. Make sure you discuss any questions you have with your health care provider. Document Released: 04/09/2015 Document Revised: 12/01/2015 Document Reviewed: 01/12/2015 Elsevier Interactive Patient Education  2017 [place] Prevention in the Home Falls can cause injuries. They can happen to people of all ages. There are many things you can do to make your home safe and to help prevent falls. What can I do on the outside of my home? Regularly fix the edges of walkways and driveways and fix any cracks. Remove anything that might make you trip as you walk through a door, such as a raised step or threshold. Trim any bushes or trees on the path to your home. Use bright outdoor lighting. Clear any walking paths of anything that might make someone trip, such as rocks or tools. Regularly check to see if handrails are loose or broken. Make sure that both sides of any steps have handrails. Any raised decks and porches should have guardrails on the edges. Have any leaves, snow, or ice cleared regularly. Use sand or salt on walking paths during winter. Clean up any spills in your garage right away. This includes oil or grease spills. What can I do in the bathroom? Use night lights. Install grab bars by the toilet and in the tub and shower. Do not use towel bars as grab bars. Use non-skid mats or decals in the tub or shower. If you need to sit down in the shower, use a  plastic, non-slip stool. Keep the floor dry. Clean up any water that spills on the floor as soon as it happens. Remove soap buildup in the tub or shower regularly. Attach bath mats securely with double-sided non-slip rug tape. Do not have throw rugs and other things on the floor that can make you trip. What can I do in the bedroom? Use night lights. Make sure that you have a light by your bed that is easy to reach. Do not use any sheets or blankets that are too big for your bed. They should not hang down onto the floor. Have a firm chair that has side arms. You can use this for support while you get dressed. Do not have throw rugs and other things on the floor that can make you trip. What can I do in the kitchen? Clean up any spills right away. Avoid walking on wet floors. Keep items that you use a lot in easy-to-reach places. If you need to reach something above you, use a strong step stool that has a grab bar. Keep electrical cords out of the way. Do not use floor polish or wax that makes floors slippery. If you must use wax, use non-skid floor wax. Do not have throw rugs and other things on the floor that can make you trip. What can I do with my stairs? Do not leave any items on the stairs. Make sure that there are handrails on both sides of the stairs and use them. Fix handrails that are broken or loose. Make sure that handrails are as long as the stairways. Check any carpeting to make sure that it is firmly attached to the stairs. Fix any carpet that is loose or worn. Avoid having throw rugs at the top or bottom of the stairs. If you do have throw rugs, attach them to the floor with carpet tape. Make sure that you have a light switch at the top  of the stairs and the bottom of the stairs. If you do not have them, ask someone to add them for you. What else can I do to help prevent falls? Wear shoes that: Do not have high heels. Have rubber bottoms. Are comfortable and fit you  well. Are closed at the toe. Do not wear sandals. If you use a stepladder: Make sure that it is fully opened. Do not climb a closed stepladder. Make sure that both sides of the stepladder are locked into place. Ask someone to hold it for you, if possible. Clearly mark and make sure that you can see: Any grab bars or handrails. First and last steps. Where the edge of each step is. Use tools that help you move around (mobility aids) if they are needed. These include: Canes. Walkers. Scooters. Crutches. Turn on the lights when you go into a dark area. Replace any light bulbs as soon as they burn out. Set up your furniture so you have a clear path. Avoid moving your furniture around. If any of your floors are uneven, fix them. If there are any pets around you, be aware of where they are. Review your medicines with your doctor. Some medicines can make you feel dizzy. This can increase your chance of falling. Ask your doctor what other things that you can do to help prevent falls. This information is not intended to replace advice given to you by your health care provider. Make sure you discuss any questions you have with your health care provider. Document Released: 01/07/2009 Document Revised: 08/19/2015 Document Reviewed: 04/17/2014 Elsevier Interactive Patient Education  2017 [employer].

## 2022-03-30 ENCOUNTER — Ambulatory Visit (INDEPENDENT_AMBULATORY_CARE_PROVIDER_SITE_OTHER): Payer: Medicare Other

## 2022-03-30 VITALS — Ht 67.0 in | Wt 280.0 lb

## 2022-03-30 DIAGNOSIS — Z0000 Encounter for general adult medical examination without abnormal findings: Secondary | ICD-10-CM

## 2022-04-08 ENCOUNTER — Other Ambulatory Visit: Payer: Self-pay | Admitting: Family Medicine

## 2022-04-08 DIAGNOSIS — G4452 New daily persistent headache (NDPH): Secondary | ICD-10-CM

## 2022-04-10 NOTE — Telephone Encounter (Signed)
Requested Prescriptions  Pending Prescriptions Disp Refills   topiramate (TOPAMAX) 25 MG tablet [Pharmacy Med Name: TOPIRAMATE 25 MG TABS 25 Tablet] 120 tablet 5    Sig: TAKE 2 TABLETS (50 MG TOTAL) BY MOUTH 2 (TWO) TIMES DAILY.     Neurology: Anticonvulsants - topiramate & zonisamide Passed - 04/08/2022  9:51 AM      Passed - Cr in normal range and within 360 days    Creat  Date Value Ref Range Status  07/25/2021 1.23 0.70 - 1.35 mg/dL Final         Passed - CO2 in normal range and within 360 days    CO2  Date Value Ref Range Status  07/25/2021 24 20 - 32 mmol/L Final         Passed - ALT in normal range and within 360 days    ALT  Date Value Ref Range Status  07/25/2021 21 9 - 46 U/L Final         Passed - AST in normal range and within 360 days    AST  Date Value Ref Range Status  07/25/2021 14 10 - 35 U/L Final         Passed - Completed PHQ-2 or PHQ-9 in the last 360 days      Passed - Valid encounter within last 12 months    Recent Outpatient Visits           7 months ago Right foot pain   [place] [name], MD   8 months ago Type 2 diabetes mellitus without complication, without long-term current use of insulin ([place])   [facility] [name], MD   11 months ago Chronic pain of right knee   [place] Pick[facility], Cammie Mcgee, MD   1 year ago Right leg pain   [place] Pickard, Cammie Mcgee, MD   1 year ago Plantar fasciitis, right   [place] Pickard, Cammie Mcgee, MD

## 2022-04-11 ENCOUNTER — Encounter: Payer: Self-pay | Admitting: Family Medicine

## 2022-04-11 ENCOUNTER — Ambulatory Visit (INDEPENDENT_AMBULATORY_CARE_PROVIDER_SITE_OTHER): Payer: 59 | Admitting: Family Medicine

## 2022-04-11 VITALS — BP 132/82 | HR 103 | Temp 98.4°F | Ht 67.0 in | Wt 280.0 lb

## 2022-04-11 DIAGNOSIS — B3742 Candidal balanitis: Secondary | ICD-10-CM

## 2022-04-11 DIAGNOSIS — G8929 Other chronic pain: Secondary | ICD-10-CM | POA: Diagnosis not present

## 2022-04-11 DIAGNOSIS — M25561 Pain in right knee: Secondary | ICD-10-CM

## 2022-04-11 DIAGNOSIS — R300 Dysuria: Secondary | ICD-10-CM | POA: Diagnosis not present

## 2022-04-11 DIAGNOSIS — Z202 Contact with and (suspected) exposure to infections with a predominantly sexual mode of transmission: Secondary | ICD-10-CM

## 2022-04-11 LAB — URINALYSIS, ROUTINE W REFLEX MICROSCOPIC
Bacteria, UA: NONE SEEN /HPF
Bilirubin Urine: NEGATIVE
Hyaline Cast: NONE SEEN /LPF
Ketones, ur: NEGATIVE
Nitrite: NEGATIVE
Protein, ur: NEGATIVE
Specific Gravity, Urine: 1.02 (ref 1.001–1.035)
pH: 6.5 (ref 5.0–8.0)

## 2022-04-11 LAB — MICROSCOPIC MESSAGE

## 2022-04-11 MED ORDER — SEMAGLUTIDE(0.25 OR 0.5MG/DOS) 2 MG/3ML ~~LOC~~ SOPN: 0.5000 mg | PEN_INJECTOR | SUBCUTANEOUS | 1 refills | Status: DC

## 2022-04-11 MED ORDER — FLUCONAZOLE 150 MG PO TABS: 150.0000 mg | ORAL_TABLET | Freq: Once | ORAL | 0 refills | Status: AC

## 2022-04-11 MED ORDER — NYSTATIN 100000 UNIT/GM EX CREA: 1.0000 | TOPICAL_CREAM | Freq: Three times a day (TID) | CUTANEOUS | 1 refills | Status: AC

## 2022-04-11 NOTE — Progress Notes (Signed)
Subjective:    Patient ID: Kenneth [name], male    DOB: May 06, 1960, 62 y.o.   MRN: [id number]  Patient currently on Jardiance for diabetes.  However he has a rash on the side of his foreskin.  The rash consist of 3 linear fissures tracking from the distal tip of his penis down to the mid shaft.  There is oozing white yeastlike discharge surrounding a linear fissures.  It hurts.  Urinalysis shows trace leukocyte esterase but is otherwise unremarkable but does have +2 glucosuria likely due to the Jardiance.  He also reports chronic pain in his right knee.  It hurts for him to stand and walk.  He is requesting a cortisone injection in his knee. Past Medical History:  Diagnosis Date   Arthritis    Asthma    Chronic bronchitis (HCC)    Cocaine abuse ([place])    GERD (gastroesophageal reflux disease)    Headache    Hyperlipidemia    Hypertension    OSA treated with BiPAP    ahi-132, bipap 23/19   Syncope    Tobacco abuse    Past Surgical History:  Procedure Laterality Date   ESOPHAGOGASTRODUODENOSCOPY (EGD) WITH PROPOFOL N/A 12/11/2012   Procedure: ESOPHAGOGASTRODUODENOSCOPY (EGD) WITH PROPOFOL;  Surgeon: Arta Silence, MD;  Location: [facility];  Service: Endoscopy;  Laterality: N/A;   FINGER AMPUTATION     KNEE ARTHROSCOPY Left 11/15/2015   Procedure: ARTHROSCOPY KNEE AND MEDIAL MENISECTOMY;  Surgeon: Paralee Cancel, MD;  Location: [facility];  Service: Orthopedics;  Laterality: Left;   KNEE ARTHROSCOPY WITH LATERAL MENISECTOMY Right 07/14/2020   Procedure: RIGHT KNEE PARTIAL LATERAL MENISCECTOMY;  Surgeon: Leandrew Koyanagi, MD;  Location: [facility];  Service: Orthopedics;  Laterality: Right;   Current Outpatient Medications on File Prior to Visit  Medication Sig Dispense Refill   ACCU-CHEK GUIDE test strip USE AS DIRECTED TO MONITOR FSBS 2 TIMES A DAY 100 strip 1   Accu-Chek Softclix Lancets lancets Use as instructed 100 each 12   albuterol (VENTOLIN HFA) 108 (90 Base) MCG/ACT inhaler INHALE 2 PUFFS  INTO THE LUNGS EVERY 4 HOURS AS NEEDED FOR SHORTNESS OF BREATH 8.5 g 2   Allopurinol 200 MG TABS Take 200 mg by mouth daily. TAKE 1 TAB ('200MG'$ )  PO DAILY 90 tablet 3   amLODipine (NORVASC) 5 MG tablet TAKE 1 TABLET (5 MG TOTAL) BY MOUTH DAILY. 90 tablet 0   aspirin 81 MG chewable tablet Chew 81 mg by mouth in the morning.     atorvastatin (LIPITOR) 40 MG tablet TAKE 1 TABLET (40 MG TOTAL) BY MOUTH DAILY. 90 tablet 3   blood glucose meter kit and supplies KIT Dispense based on patient and insurance preference. Use up to four times daily as directed. 1 each 11   Blood Glucose Monitoring Suppl (ACCU-CHEK AVIVA PLUS) w/Device KIT Use as directed to monitor FSBS 2x daily. Dx: E11.9 1 kit 1   celecoxib (CELEBREX) 200 MG capsule Take 1 capsule (200 mg total) by mouth 2 (two) times daily. 20 capsule 0   cyclobenzaprine (FLEXERIL) 10 MG tablet Take 0.5-1 tablets (5-10 mg total) by mouth 2 (two) times daily as needed for muscle spasms. 20 tablet 0   Erenumab-aooe (AIMOVIG) 140 MG/ML SOAJ Inject 140 mg into the skin every 30 (thirty) days. 1.12 mL 11   gabapentin (NEURONTIN) 300 MG capsule TAKE 1 CAPSULE BY MOUTH 3 TIMES DAILY AS NEEDED (NERVE PAIN). 90 capsule 3   glucose blood test strip 1 each  by Other route 2 (two) times daily. Use as instructed 100 each 12   HYDROcodone-acetaminophen (NORCO) 5-325 MG tablet Take 1-2 tablets by mouth 2 (two) times daily as needed. 20 tablet 0   linaclotide (LINZESS) 145 MCG CAPS capsule Take 1 capsule (145 mcg total) by mouth daily before breakfast. 30 capsule 5   meloxicam (MOBIC) 15 MG tablet Take 1 tablet (15 mg total) by mouth daily. 30 tablet 0   metFORMIN (GLUCOPHAGE) 1000 MG tablet TAKE 1 TABLET BY MOUTH 2 TIMES DAILY WITH A MEAL. 180 tablet 3   nitroGLYCERIN (NITROSTAT) 0.4 MG SL tablet Place 1 tablet (0.4 mg total) under the tongue every 5 (five) minutes as needed. For chest pain (Patient taking differently: Place 0.4 mg under the tongue every 5 (five) minutes x 3  doses as needed for chest pain.) 20 tablet 1   omeprazole (PRILOSEC) 40 MG capsule Take 1 capsule (40 mg total) by mouth daily. 90 capsule 3   senna-docusate (SENOKOT-S) 8.6-50 MG tablet Take 1 tablet by mouth daily as needed (constipation).     topiramate (TOPAMAX) 25 MG tablet TAKE 2 TABLETS (50 MG TOTAL) BY MOUTH 2 (TWO) TIMES DAILY. 120 tablet 5   Current Facility-Administered Medications on File Prior to Visit  Medication Dose Route Frequency Provider Last Rate Last Admin   lactated ringers infusion    Continuous PRN Chyrel Masson, CRNA   New Bag at 11/15/15 1545   No Known Allergies Social History   Socioeconomic History   Marital status: Divorced    Spouse name: Not on file   Number of children: 0   Years of education: Not on file   Highest education level: Not on file  Occupational History   Not on file  Tobacco Use   Smoking status: Every Day    Packs/day: 0.50    Years: 40.00    Total pack years: 20.00    Types: Cigarettes   Smokeless tobacco: Never   Tobacco comments:    06/05/17 1 pack per 1 1/2 days  Substance and Sexual Activity   Alcohol use: Yes    Alcohol/week: 7.0 standard drinks of alcohol    Types: 7 Cans of beer per week   Drug use: Yes    Types: Cocaine    Comment: patient states none- read lab report 09/02/2012/ 11-20-12 case cx.due to positive screen test-pt. advised no recreational drugs to be used   Sexual activity: Not on file  Other Topics Concern   Not on file  Social History Narrative   Lives with brother and his sister in [place].     Social Determinants of Health   Financial Resource Strain: Low Risk  (03/30/2022)   Overall Financial Resource Strain (CARDIA)    Difficulty of Paying Living Expenses: Not hard at all  Food Insecurity: No Food Insecurity (03/30/2022)   Hunger Vital Sign    Worried About Running Out of Food in the Last Year: Never true    Ran Out of Food in the Last Year: Never true  Transportation Needs: No Transportation  Needs (03/30/2022)   PRAPARE - Hydrologist (Medical): No    Lack of Transportation (Non-Medical): No  Physical Activity: Inactive (03/30/2022)   Exercise Vital Sign    Days of Exercise per Week: 0 days    Minutes of Exercise per Session: 0 min  Stress: No Stress Concern Present (03/30/2022)   [place]  Feeling of Stress : Not at all  Social Connections: Socially Isolated (03/30/2022)   Social Connection and Isolation Panel [NHANES]    Frequency of Communication with Friends and Family: More than three times a week    Frequency of Social Gatherings with Friends and Family: Three times a week    Attends Religious Services: Never    Active Member of Clubs or Organizations: No    Attends Archivist Meetings: Never    Marital Status: Divorced  Human resources officer Violence: Not At Risk (03/30/2022)   Humiliation, Afraid, Rape, and Kick questionnaire    Fear of Current or Ex-Partner: No    Emotionally Abused: No    Physically Abused: No    Sexually Abused: No     Review of Systems  All other systems reviewed and are negative.      Objective:   Physical Exam Vitals reviewed.  Constitutional:      Appearance: He is obese.  Cardiovascular:     Rate and Rhythm: Normal rate and regular rhythm.  Pulmonary:     Effort: Pulmonary effort is normal.     Breath sounds: Normal breath sounds.  Genitourinary:    Penis: Tenderness, discharge, swelling and lesions present.     Musculoskeletal:     Right knee: Swelling and effusion present. No bony tenderness. Decreased range of motion. Tenderness present over the medial joint line and lateral joint line.  Neurological:     Mental Status: He is alert.           Assessment & Plan:   Possible exposure to STD - Plan: C. TRACHOMATIS/N. GONORRHOEAE RNA ([facility])  Burning with urination - Plan: Urinalysis, Routine w reflex  microscopic  Candidal balanitis  Chronic pain of right knee Patient's pain is significant.  Patient is walking with a limp and has severe pain when he bears weight on his knee. Using sterile technique, I injected the right knee with 2 cc of lidocaine, 2 cc of Marcaine, and 2 cc of 40 mg/ml Kenalog.  Patient also has Candida balanitis.  Begin Diflucan 150 mg p.o. x 1 coupled with nystatin cream applied 3 times a day to the area for 1 week.  Recheck in 1 week if no better or sooner if worse.  Discontinue Jardiance and replace with Ozempic 0.5 mg subcu weekly

## 2022-04-12 LAB — C. TRACHOMATIS/N. GONORRHOEAE RNA
C. trachomatis RNA, TMA: NOT DETECTED
N. gonorrhoeae RNA, TMA: NOT DETECTED

## 2022-04-25 DIAGNOSIS — H524 Presbyopia: Secondary | ICD-10-CM | POA: Diagnosis not present

## 2022-04-25 DIAGNOSIS — H25813 Combined forms of age-related cataract, bilateral: Secondary | ICD-10-CM | POA: Diagnosis not present

## 2022-04-25 DIAGNOSIS — E119 Type 2 diabetes mellitus without complications: Secondary | ICD-10-CM | POA: Diagnosis not present

## 2022-04-25 DIAGNOSIS — H04123 Dry eye syndrome of bilateral lacrimal glands: Secondary | ICD-10-CM | POA: Diagnosis not present

## 2022-04-25 LAB — HM DIABETES EYE EXAM

## 2022-04-28 ENCOUNTER — Ambulatory Visit (INDEPENDENT_AMBULATORY_CARE_PROVIDER_SITE_OTHER): Payer: 59

## 2022-04-28 DIAGNOSIS — Z23 Encounter for immunization: Secondary | ICD-10-CM | POA: Diagnosis not present

## 2022-05-17 DIAGNOSIS — M7671 Peroneal tendinitis, right leg: Secondary | ICD-10-CM | POA: Diagnosis not present

## 2022-05-17 DIAGNOSIS — I739 Peripheral vascular disease, unspecified: Secondary | ICD-10-CM | POA: Diagnosis not present

## 2022-05-17 DIAGNOSIS — M722 Plantar fascial fibromatosis: Secondary | ICD-10-CM | POA: Diagnosis not present

## 2022-05-17 DIAGNOSIS — E1151 Type 2 diabetes mellitus with diabetic peripheral angiopathy without gangrene: Secondary | ICD-10-CM | POA: Diagnosis not present

## 2022-06-27 DIAGNOSIS — G4733 Obstructive sleep apnea (adult) (pediatric): Secondary | ICD-10-CM | POA: Diagnosis not present

## 2022-07-06 ENCOUNTER — Other Ambulatory Visit: Payer: Self-pay | Admitting: Family Medicine

## 2022-07-06 NOTE — Telephone Encounter (Signed)
Requested Prescriptions  Pending Prescriptions Disp Refills   OZEMPIC, 0.25 OR 0.5 MG/DOSE, 2 MG/3ML SOPN [Pharmacy Med Name: OZEMPIC 0.25 OR 0.5 MG/DOS 2 Solution Pen-injector] 3 mL 1    Sig: INJECT 0.5 MG INTO THE SKIN ONCE A WEEK.     Endocrinology:  Diabetes - GLP-1 Receptor Agonists - semaglutide Failed - 07/06/2022 10:04 AM      Failed - HBA1C in normal range and within 180 days    Hgb A1C (fingerstick)  Date Value Ref Range Status  01/28/2016 >14.0 (H) <5.7 % Final    Comment:                                                                           According to the ADA Clinical Practice Recommendations for 2011, when HbA1c is used as a screening test:     >=6.5%   Diagnostic of Diabetes Mellitus            (if abnormal result is confirmed)   5.7-6.4%   Increased risk of developing Diabetes Mellitus   References:Diagnosis and Classification of Diabetes Mellitus,Diabetes Care,2011,34(Suppl 1):S62-S69 and Standards of Medical Care in         Diabetes - 2011,Diabetes Care,2011,34 (Suppl 1):S11-S61.      Hgb A1c MFr Bld  Date Value Ref Range Status  07/25/2021 6.5 (H) <5.7 % of total Hgb Final    Comment:    For someone without known diabetes, a hemoglobin A1c value of 6.5% or greater indicates that they may have  diabetes and this should be confirmed with a follow-up  test. . For someone with known diabetes, a value <7% indicates  that their diabetes is well controlled and a value  greater than or equal to 7% indicates suboptimal  control. A1c targets should be individualized based on  duration of diabetes, age, comorbid conditions, and  other considerations. . Currently, no consensus exists regarding use of hemoglobin A1c for diagnosis of diabetes for children. .          Failed - Valid encounter within last 6 months    Recent Outpatient Visits           10 months ago Right foot pain   [facility] Pickard, Priscille Heidelberg, MD   11 months ago Type 2  diabetes mellitus without complication, without long-term current use of insulin (HCC)   [facility] Pickard, Priscille Heidelberg, MD   1 year ago Chronic pain of right knee   [facility] Pickard, Priscille Heidelberg, MD   1 year ago Right leg pain   [facility] Tanya Nones, Priscille Heidelberg, MD   1 year ago Plantar fasciitis, right   [facility] Pickard, Priscille Heidelberg, MD              Passed - Cr in normal range and within 360 days    Creat  Date Value Ref Range Status  07/25/2021 1.23 0.70 - 1.35 mg/dL Final          albuterol (VENTOLIN HFA) 108 (90 Base) MCG/ACT inhaler [Pharmacy Med Name: ALBUTEROL SULF HFA 90 MCG 108 (90 BAS Aerosol] 8.5 g 2    Sig: INHALE 2  PUFFS INTO THE LUNGS EVERY 4 HOURS AS NEEDED FOR SHORTNESS OF BREATH     Pulmonology:  Beta Agonists 2 Passed - 07/06/2022 10:04 AM      Passed - Last BP in normal range    BP Readings from Last 1 Encounters:  04/11/22 132/82         Passed - Last Heart Rate in normal range    Pulse Readings from Last 1 Encounters:  04/11/22 (!) 103         Passed - Valid encounter within last 12 months    Recent Outpatient Visits           10 months ago Right foot pain   [facility] Donita Brooks, MD   11 months ago Type 2 diabetes mellitus without complication, without long-term current use of insulin (HCC)   [facility] Pickard, Priscille Heidelberg, MD   1 year ago Chronic pain of right knee   [facility] Pickard, Priscille Heidelberg, MD   1 year ago Right leg pain   [facility] Pickard, Priscille Heidelberg, MD   1 year ago Plantar fasciitis, right   [facility] Pickard, Priscille Heidelberg, MD

## 2022-07-11 DIAGNOSIS — M7671 Peroneal tendinitis, right leg: Secondary | ICD-10-CM | POA: Diagnosis not present

## 2022-07-11 DIAGNOSIS — E1151 Type 2 diabetes mellitus with diabetic peripheral angiopathy without gangrene: Secondary | ICD-10-CM | POA: Diagnosis not present

## 2022-07-11 DIAGNOSIS — M722 Plantar fascial fibromatosis: Secondary | ICD-10-CM | POA: Diagnosis not present

## 2022-07-11 DIAGNOSIS — I739 Peripheral vascular disease, unspecified: Secondary | ICD-10-CM | POA: Diagnosis not present

## 2022-07-19 ENCOUNTER — Telehealth: Payer: Self-pay | Admitting: Pharmacist

## 2022-07-19 NOTE — Progress Notes (Signed)
Care Management & Coordination Services Pharmacy Team  Reason for Encounter: Appointment Reminder  Contacted patient to confirm telephone appointment with Erskine Emery, PharmD on 07/20/2022 at 3:45 pm. Spoke with patient on 07/19/2022   Star Rating Drugs:  Atorvastatin 40 mg last filled 06/27/2022 90 DS Metformin 1000 mg last filled 06/27/2022 90 DS Ozempic last filled 07/06/2022 28 DS   Care Gaps: Annual wellness visit in last year? Yes  If Diabetic: Last eye exam / retinopathy screening: 04/25/2022 Last diabetic foot exam: 07/25/2021   Future Appointments  Date Time Provider Department Center  07/20/2022  3:45 PM Erroll Luna, [facility] None  03/07/2023 10:45 AM Huston Foley, MD [facility] None  04/05/2023  2:45 PM [facility]-ANNUAL WELLNESS VISIT [facility]   April D Calhoun, [facility] Clinical Pharmacist Assistant [phone]

## 2022-07-20 ENCOUNTER — Ambulatory Visit: Payer: 59 | Admitting: Pharmacist
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
## 2022-07-28 ENCOUNTER — Other Ambulatory Visit: Payer: Self-pay | Admitting: Family Medicine

## 2022-08-10 ENCOUNTER — Encounter: Payer: Self-pay | Admitting: *Deleted

## 2022-08-10 ENCOUNTER — Ambulatory Visit: Payer: 59 | Admitting: *Deleted

## 2022-08-10 NOTE — Patient Outreach (Signed)
Care Coordination   Initial Visit Note   08/10/2022  Name: Kenneth [name] MRN: [id number] DOB: 1960/07/02  Kenneth [name] is a 62 y.o. year old male who sees Pickard, Priscille Heidelberg, MD for primary care. I spoke with [name] by phone today.  What matters to the patients health and wellness today?  No interventions identified.  Patient denies need for social work involvement at this time.   Goals Addressed             This Visit's Progress    COMPLETED: Assess Need for Social Work Involvement.   On track    Care Coordination Interventions:  Interventions Today    Flowsheet Row Most Recent Value  Chronic Disease   Chronic disease during today's visit Diabetes, Hypertension (HTN), Other  [Obesity, Cocaine Use, Tobacco Use, Alcohol Use]  General Interventions   General Interventions Discussed/Reviewed General Interventions Discussed, Labs, Vaccines, Doctor Visits, Annual Foot Exam, Health Screening, General Interventions Reviewed, Annual Eye Exam, Durable Medical Equipment (DME), Community Resources, Level of Care  [Encouraged]  Labs Hgb A1c every 3 months  [Encouraged]  Vaccines COVID-19, Flu, Pneumonia, RSV, Shingles, Tetanus/Pertussis/Diphtheria  [Encouraged]  Doctor Visits Discussed/Reviewed Doctor Visits Discussed, Specialist, Doctor Visits Reviewed, Annual Wellness Visits, PCP  [Encouraged]  Health Screening Bone Density, Colonoscopy, Prostate  [Encouraged]  Durable Medical Equipment (DME) BP Cuff, Glucomoter, Insulin Pump  [Encouraged]  PCP/Specialist Visits Compliance with follow-up visit  [Encouraged]  Exercise Interventions   Exercise Discussed/Reviewed Exercise Discussed, Exercise Reviewed, Physical Activity, Weight Managment, Assistive device use and maintanence  [Encouraged]  Physical Activity Discussed/Reviewed Physical Activity Discussed, Home Exercise Program (HEP), Physical Activity Reviewed, Types of exercise  [Encouraged]  Weight Management Weight loss  [Encouraged]   Education Interventions   Education Provided Provided Education  [Encouraged]  Provided Verbal Education On Nutrition, Mental Health/Coping with Illness, When to see the doctor, Foot Care, Eye Care, Blood Sugar Monitoring, Exercise, Medication, Insurance Plans, Community Resources  [Encouraged]  Mental Health Interventions   Mental Health Discussed/Reviewed Mental Health Discussed, Anxiety, Depression, Grief and Loss, Mental Health Reviewed, Coping Strategies, Crisis, Other, Suicide, Substance Abuse  [Domestic Violence]  Nutrition Interventions   Nutrition Discussed/Reviewed Increaing proteins, Decreasing fats, Decreasing salt, Decreasing sugar intake, Carbohydrate meal planning, Portion sizes, Fluid intake, Nutrition Reviewed, Nutrition Discussed, Adding fruits and vegetables  [Encouraged]  Pharmacy Interventions   Pharmacy Dicussed/Reviewed Pharmacy Topics Discussed, Pharmacy Topics Reviewed, Medication Adherence, Affording Medications  [Encouraged]  Safety Interventions   Safety Discussed/Reviewed Safety Discussed, Safety Reviewed  [Encouraged]  Advanced Directive Interventions   Advanced Directives Discussed/Reviewed Advanced Directives Discussed  [Encouraged Completion]     Danford Bad, BSW, MSW, LCSW  Licensed Clinical Social Worker  [facility]  Mailing [place] [address], [place], [place] [address] Physical Address[address] [place], [place] [address] Toll Free Main # [phone] Fax # [phone] Cell # [phone] [email or web address]        SDOH assessments and interventions completed:  Yes.  SDOH Interventions Today    Flowsheet Row Most Recent Value  SDOH Interventions   Food Insecurity Interventions Intervention Not Indicated  Housing Interventions Intervention Not Indicated  Transportation Interventions Intervention Not Indicated, Patient Resources (Friends/Family), Payor Benefit  Utilities  Interventions Intervention Not Indicated  Alcohol Usage Interventions Patient Refused, Alohol Education/Brief Advice  Financial Strain Interventions Intervention Not Indicated  Physical Activity Interventions Patient Declined  Stress Interventions Intervention Not Indicated  Social Connections Interventions Intervention Not Indicated  Care Coordination Interventions:  No, not indicated.   Follow up plan: No further intervention required.   Encounter Outcome:  Pt. Visit Completed.   Danford Bad, BSW, MSW, LCSW  Licensed Restaurant manager, [facility]  Mailing [place] [address], [place], [place] [address] Physical Address[address], [place], [place] [address] Toll Free Main # [phone] Fax # [phone] Cell # [phone] [email or web address]

## 2022-08-10 NOTE — Patient Instructions (Signed)
Visit Information  Thank you for taking time to visit with me today. Please don't hesitate to contact me if I can be of assistance to you.   Following are the goals we discussed today:   Goals Addressed             This Visit's Progress    COMPLETED: Assess Need for Social Work Involvement.   On track    Care Coordination Interventions:  Interventions Today    Flowsheet Row Most Recent Value  Chronic Disease   Chronic disease during today's visit Diabetes, Hypertension (HTN), Other  [Obesity, Cocaine Use, Tobacco Use, Alcohol Use]  General Interventions   General Interventions Discussed/Reviewed General Interventions Discussed, Labs, Vaccines, Doctor Visits, Annual Foot Exam, Health Screening, General Interventions Reviewed, Annual Eye Exam, Durable Medical Equipment (DME), Community Resources, Level of Care  [Encouraged]  Labs Hgb A1c every 3 months  [Encouraged]  Vaccines COVID-19, Flu, Pneumonia, RSV, Shingles, Tetanus/Pertussis/Diphtheria  [Encouraged]  Doctor Visits Discussed/Reviewed Doctor Visits Discussed, Specialist, Doctor Visits Reviewed, Annual Wellness Visits, PCP  [Encouraged]  Health Screening Bone Density, Colonoscopy, Prostate  [Encouraged]  Durable Medical Equipment (DME) BP Cuff, Glucomoter, Insulin Pump  [Encouraged]  PCP/Specialist Visits Compliance with follow-up visit  [Encouraged]  Exercise Interventions   Exercise Discussed/Reviewed Exercise Discussed, Exercise Reviewed, Physical Activity, Weight Managment, Assistive device use and maintanence  [Encouraged]  Physical Activity Discussed/Reviewed Physical Activity Discussed, Home Exercise Program (HEP), Physical Activity Reviewed, Types of exercise  [Encouraged]  Weight Management Weight loss  [Encouraged]  Education Interventions   Education Provided Provided Education  [Encouraged]  Provided Verbal Education On Nutrition, Mental Health/Coping with Illness, When to see the doctor, Foot Care, Eye Care, Blood  Sugar Monitoring, Exercise, Medication, Insurance Plans, Community Resources  [Encouraged]  Mental Health Interventions   Mental Health Discussed/Reviewed Mental Health Discussed, Anxiety, Depression, Grief and Loss, Mental Health Reviewed, Coping Strategies, Crisis, Other, Suicide, Substance Abuse  [Domestic Violence]  Nutrition Interventions   Nutrition Discussed/Reviewed Increaing proteins, Decreasing fats, Decreasing salt, Decreasing sugar intake, Carbohydrate meal planning, Portion sizes, Fluid intake, Nutrition Reviewed, Nutrition Discussed, Adding fruits and vegetables  [Encouraged]  Pharmacy Interventions   Pharmacy Dicussed/Reviewed Pharmacy Topics Discussed, Pharmacy Topics Reviewed, Medication Adherence, Affording Medications  [Encouraged]  Safety Interventions   Safety Discussed/Reviewed Safety Discussed, Safety Reviewed  [Encouraged]  Advanced Directive Interventions   Advanced Directives Discussed/Reviewed Advanced Directives Discussed  [Encouraged Completion]     Danford Bad, BSW, MSW, LCSW  Licensed Clinical Social Worker  [facility]  [address]ailing [place] [address] [place], [place] [address] Physical Address[address], [place], [place] [address] Toll Free Main # [phone] Fax # [phone] Cell # [phone] [email or web address]      Please call the care guide team at [phone] if you need to cancel or reschedule your appointment.   If you are experiencing a Mental Health or Behavioral Health Crisis or need someone to talk to, please call the Suicide and Crisis Lifeline: 988 call the [place] National Suicide Prevention Lifeline: 510-592-5906 or TTY: 863-198-8138 TTY 305-258-8322) to talk to a trained counselor call 1-800-273-TALK (toll free, 24 hour hotline) go to [facility] [address] [place] [phone]) call the [facility] Crisis Line:  [phone] call 911  Patient verbalizes understanding of instructions and care plan provided today and agrees to view in MyChart. Active MyChart status and patient understanding of how to access instructions and care plan via MyChart confirmed with patient.  No further follow up required.  Danford Bad, BSW, MSW, LCSW  Licensed Restaurant manager, [employer] [facility]  Mailing [place] [address] [place], [place] [address] Physical Address[address], [place], [place] [address] Toll Free Main # [phone] Fax # [phone] Cell # [phone] [email or web address]

## 2022-08-28 ENCOUNTER — Other Ambulatory Visit: Payer: Self-pay | Admitting: Family Medicine

## 2022-08-28 DIAGNOSIS — E1169 Type 2 diabetes mellitus with other specified complication: Secondary | ICD-10-CM

## 2022-09-19 DIAGNOSIS — Z7985 Long-term (current) use of injectable non-insulin antidiabetic drugs: Secondary | ICD-10-CM | POA: Diagnosis not present

## 2022-09-19 DIAGNOSIS — J984 Other disorders of lung: Secondary | ICD-10-CM | POA: Diagnosis not present

## 2022-09-19 DIAGNOSIS — R9431 Abnormal electrocardiogram [ECG] [EKG]: Secondary | ICD-10-CM | POA: Diagnosis not present

## 2022-09-19 DIAGNOSIS — E785 Hyperlipidemia, unspecified: Secondary | ICD-10-CM | POA: Diagnosis not present

## 2022-09-19 DIAGNOSIS — B349 Viral infection, unspecified: Secondary | ICD-10-CM | POA: Diagnosis not present

## 2022-09-19 DIAGNOSIS — R519 Headache, unspecified: Secondary | ICD-10-CM | POA: Diagnosis not present

## 2022-09-19 DIAGNOSIS — Z7984 Long term (current) use of oral hypoglycemic drugs: Secondary | ICD-10-CM | POA: Diagnosis not present

## 2022-09-19 DIAGNOSIS — R42 Dizziness and giddiness: Secondary | ICD-10-CM | POA: Diagnosis not present

## 2022-09-19 DIAGNOSIS — I10 Essential (primary) hypertension: Secondary | ICD-10-CM | POA: Diagnosis not present

## 2022-09-19 DIAGNOSIS — I444 Left anterior fascicular block: Secondary | ICD-10-CM | POA: Diagnosis not present

## 2022-09-19 DIAGNOSIS — Z20822 Contact with and (suspected) exposure to covid-19: Secondary | ICD-10-CM | POA: Diagnosis not present

## 2022-09-19 DIAGNOSIS — G43809 Other migraine, not intractable, without status migrainosus: Secondary | ICD-10-CM | POA: Diagnosis not present

## 2022-09-19 DIAGNOSIS — R0789 Other chest pain: Secondary | ICD-10-CM | POA: Diagnosis not present

## 2022-09-19 DIAGNOSIS — E119 Type 2 diabetes mellitus without complications: Secondary | ICD-10-CM | POA: Diagnosis not present

## 2022-09-19 DIAGNOSIS — R531 Weakness: Secondary | ICD-10-CM | POA: Diagnosis not present

## 2022-09-19 DIAGNOSIS — I452 Bifascicular block: Secondary | ICD-10-CM | POA: Diagnosis not present

## 2022-09-19 DIAGNOSIS — I4510 Unspecified right bundle-branch block: Secondary | ICD-10-CM | POA: Diagnosis not present

## 2022-09-19 DIAGNOSIS — R0609 Other forms of dyspnea: Secondary | ICD-10-CM | POA: Diagnosis not present

## 2022-09-20 ENCOUNTER — Telehealth: Payer: Self-pay

## 2022-09-20 NOTE — Transitions of Care (Post Inpatient/ED Visit) (Signed)
09/20/2022  Name: Kenneth [name] MRN: [id number] DOB: November 07, 1960  Today's TOC FU Call Status: Today's TOC FU Call Status:: Successful TOC FU Call Competed TOC FU Call Complete Date: 09/20/22  Transition Care Management Follow-up Telephone Call Date of Discharge: 09/19/22 Discharge Facility: Other ([facility]) Name of Other ([facility]) [facility]charge Facility: [facility] Type of Discharge: Emergency Department Reason for ED Visit: Other: (Dizziness) How have you been since you were released from the hospital?: Same Any questions or concerns?: Yes Patient Questions/Concerns:: pt was told he needed testing for COPD  Items Reviewed: Did you receive and understand the discharge instructions provided?: Yes Medications obtained,verified, and reconciled?: Yes (Medications Reviewed) Any new allergies since your discharge?: No Dietary orders reviewed?: Yes Do you have support at home?: Yes  Medications Reviewed Today: Medications Reviewed Today     Reviewed by Merleen Nicely, LPN (Licensed Practical Nurse) on 09/20/22 at 1219  Med List Status: <None>   Medication Order Taking? Sig Documenting Provider Last Dose Status Informant  ACCU-CHEK GUIDE test strip [id number] Yes USE AS DIRECTED TO MONITOR FSBS 2 TIMES A DAY Donita Brooks, MD Taking Active   Accu-Chek Softclix Lancets lancets [id number] Yes Use as instructed Donita Brooks, MD Taking Active   AIMOVIG 140 MG/ML SOAJ [id number] Yes INJECT 140 MG INTO THE SKIN EVERY 30 DAYS. NEED APPOINTMENT WITH PRIMARY CARE PROVIDER FOR REFILLS Donita Brooks, MD Taking Active   albuterol (VENTOLIN HFA) 108 (90 Base) MCG/ACT inhaler [id number] Yes INHALE 2 PUFFS INTO THE LUNGS EVERY 4 HOURS AS NEEDED FOR SHORTNESS OF Lollie Sails, MD Taking Active   Allopurinol 200 MG TABS [id number] Yes Take 200 mg by mouth daily. TAKE 1 TAB (200MG )  PO DAILY Donita Brooks, MD Taking Active   amLODipine (NORVASC) 5 MG tablet [id number]  Yes TAKE 1 TABLET (5 MG TOTAL) BY MOUTH DAILY. Lewayne Bunting, MD Taking Active   aspirin 81 MG chewable tablet [id number] Yes Chew 81 mg by mouth in the morning. [provider] Taking Active Self  atorvastatin (LIPITOR) 40 MG tablet [id number] Yes TAKE 1 TABLET (40 MG TOTAL) BY MOUTH DAILY. Donita Brooks, MD Taking Active   blood glucose meter kit and supplies KIT [id number] Yes Dispense based on patient and insurance preference. Use up to four times daily as directed. Donita Brooks, MD Taking Active   Blood Glucose Monitoring Suppl (ACCU-CHEK AVIVA PLUS) w/Device KIT [id number] Yes Use as directed to monitor FSBS 2x daily. Dx: E11.9 Donita Brooks, MD Taking Active Self  celecoxib (CELEBREX) 200 MG capsule [id number] Yes Take 1 capsule (200 mg total) by mouth 2 (two) times daily. Arthor Captain, PA-C Taking Active   cyclobenzaprine (FLEXERIL) 10 MG tablet [id number] Yes Take 0.5-1 tablets (5-10 mg total) by mouth 2 (two) times daily as needed for muscle spasms. Arthor Captain, PA-C Taking Active   gabapentin (NEURONTIN) 300 MG capsule [id number] Yes TAKE 1 CAPSULE BY MOUTH 3 TIMES DAILY AS NEEDED (NERVE PAIN). Donita Brooks, MD Taking Active   glucose blood test strip [id number] Yes 1 each by Other route 2 (two) times daily. Use as instructed Donita Brooks, MD Taking Active   HYDROcodone-acetaminophen [facility]) 5-325 MG tablet [id number] Yes Take 1-2 tablets by mouth 2 (two) times daily as needed. Donita Brooks, MD Taking Active   linaclotide Karlene Einstein) 145 MCG CAPS capsule [id number] Yes Take 1 capsule (145 mcg total) by mouth daily before breakfast. Donita Brooks, MD Taking Active  meloxicam (MOBIC) 15 MG tablet [id number] Yes Take 1 tablet (15 mg total) by mouth daily. Donita Brooks, MD Taking Active   metFORMIN (GLUCOPHAGE) 1000 MG tablet [id number] Yes TAKE 1 TABLET BY MOUTH 2 TIMES DAILY WITH A MEAL. Donita Brooks, MD Taking Active   nitroGLYCERIN (NITROSTAT)  0.4 MG SL tablet [id number] Yes Place 1 tablet (0.4 mg total) under the tongue every 5 (five) minutes as needed. For chest pain  Patient taking differently: Place 0.4 mg under the tongue every 5 (five) minutes x 3 doses as needed for chest pain.   Salley Scarlet, MD Taking Active            Med Note Derenda Fennel   Fri Oct 08, 2020 11:14 AM) Patient has on hand if needed  nystatin cream (MYCOSTATIN) [id number] Yes Apply 1 Application topically 3 (three) times daily. Donita Brooks, MD Taking Active   omeprazole (PRILOSEC) 40 MG capsule [id number] Yes TAKE 1 CAPSULE BY MOUTH DAILY. Donita Brooks, MD Taking Active   OZEMPIC, 0.25 OR 0.5 MG/DOSE, 2 MG/3ML SOPN [id number] Yes INJECT 0.5 MG INTO THE SKIN ONCE A WEEK. Donita Brooks, MD Taking Active   senna-docusate (SENOKOT-S) 8.6-50 MG tablet [id number] Yes Take 1 tablet by mouth daily as needed (constipation). [provider] Taking Active Self  topiramate (TOPAMAX) 25 MG tablet [id number] Yes TAKE 2 TABLETS (50 MG TOTAL) BY MOUTH 2 (TWO) TIMES DAILY. Donita Brooks, MD Taking Active   Med List Note Halford Decamp, CPhT 07/09/20 1004): Cpap at bedtime            Home Care and Equipment/Supplies: Were Home Health Services Ordered?: No Any new equipment or medical supplies ordered?: No  Functional Questionnaire: Do you need assistance with bathing/showering or dressing?: No Do you need assistance with meal preparation?: No Do you need assistance with eating?: No Do you have difficulty maintaining continence: No Do you need assistance with getting out of bed/getting out of a chair/moving?: No Do you have difficulty managing or taking your medications?: No  Follow up appointments reviewed: PCP Follow-up appointment confirmed?: Yes Date of PCP follow-up appointment?: 10/05/22 Follow-up Provider: [facility] Follow-up appointment confirmed?: No Do you need transportation to your follow-up  appointment?: No Do you understand care options if your condition(s) worsen?: Yes-patient verbalized understanding    SIGNATURE  Woodfin Ganja LPN [facility] Nurse Health Advisor Direct Dial [phone]

## 2022-09-26 ENCOUNTER — Other Ambulatory Visit: Payer: Self-pay | Admitting: Family Medicine

## 2022-10-05 ENCOUNTER — Encounter: Payer: Self-pay | Admitting: Family Medicine

## 2022-10-05 ENCOUNTER — Ambulatory Visit
Admission: RE | Admit: 2022-10-05 | Discharge: 2022-10-05 | Disposition: A | Discharge status: Home / Self Care | Payer: 59 | Source: Ambulatory Visit | Attending: Family Medicine | Admitting: Family Medicine

## 2022-10-05 ENCOUNTER — Ambulatory Visit (INDEPENDENT_AMBULATORY_CARE_PROVIDER_SITE_OTHER): Payer: 59 | Admitting: Family Medicine

## 2022-10-05 VITALS — BP 132/76 | HR 101 | Temp 98.0°F | Ht 67.0 in | Wt 283.0 lb

## 2022-10-05 DIAGNOSIS — M79671 Pain in right foot: Secondary | ICD-10-CM

## 2022-10-05 DIAGNOSIS — E119 Type 2 diabetes mellitus without complications: Secondary | ICD-10-CM | POA: Diagnosis not present

## 2022-10-05 DIAGNOSIS — M25562 Pain in left knee: Secondary | ICD-10-CM | POA: Diagnosis not present

## 2022-10-05 DIAGNOSIS — R0602 Shortness of breath: Secondary | ICD-10-CM

## 2022-10-05 MED ORDER — ADVAIR HFA 230-21 MCG/ACT IN AERO: 2.0000 | INHALATION_SPRAY | Freq: Two times a day (BID) | RESPIRATORY_TRACT | 3 refills | Status: DC

## 2022-10-05 NOTE — Progress Notes (Signed)
Subjective:    Patient ID: Kenneth [name], male    DOB: May 06, 1960, 62 y.o.   MRN: [id number]  Recently seen at outside ER.  I copied H/P: "Kenneth [name] is a 62 y.o. male with past medical history of DM (on Metformin and Ozempic), HTN, asthma, HLD, arthritis, OSA on CPAP, cocaine abuse, and tobacco abuse presenting with 4 weeks of an intermittent frontal headache with associated lightheadedness that worsened in severity today. Per his niece at bedside, the patient has headaches every day since his symptoms began, that start as mild and worsen throughout the day. No known exacerbating or relieving factors. He states that he has synopsized twice secondary to his symptoms, but notes that this was "awhile ago." Denies any recent syncopal events. He has a history of migraines, but he states his current symptoms are dissimilar since he has not endorsed any vision changes at this time. He takes aimovig for his migraines with relief. He checks his BP regularly and has not noted changes in his BP when his symptoms come on. He endorses tobacco use, smoking 2 packs a day for the past 15 years. Endorses EtOH use, drinking an unspecified amount of beer and liquor three times a week. His niece notes that the patient will drink "all night." The patient states that when he drinks he will drink a case of beer with a group of people in one night. He denies withdrawal symptoms with alcohol cessation. Denies neck pain, neck stiffness, nausea, emesis, chest pain, vision changes, fevers, diarrhea, abdominal pain.   On interview, he also endorsed an episode of dark colored emesis and epistaxis 5 days ago. Also upon interview, he has chronic dyspnea on exertion and a non-productive cough that has been progressively worsening over the past few weeks. He has a rescue inhaler which he uses with relief. Additionally upon interview, he endorses ongoing waxing and waning swelling in his BLE with no recent changes. Further, he  endorses a mild "burning" sensation in his RLE. 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 "  CT head, cbc cmp, BNP, troponin unremarkable.   Patient presents today for follow-up.  He states that he is becoming more short of breath with activity over the last several months.  He denies any chest pain.  He is having to use his albuterol more often.  He has been smoking half a pack a day to a pack a day for more than 40 years.  He denies any angina.  He denies any hemoptysis.  He denies any orthopnea or paroxysmal nocturnal dyspnea.  Today on examination he has faint expiratory wheezing with diminished breath sounds bilaterally.  He has never been evaluated for emphysema.  He also complains of pain in his right great toe.  Patient dropped a heavy metal object on his toe a couple days ago.  He reports pain over the IP joint distal to the MTP joint.  The toe is swollen and very tender to the touch. We have not checked his blood sugar/A1c in over a year.  Recently at the emergency room, his sugar was greater than 240.  He is also requesting a cortisone injection in his left knee.  He has knee pain secondary osteoarthritis.  He is requesting a cortisone shot due to pain with ambulation Past Medical History:  Diagnosis Date   Arthritis    Asthma    Chronic bronchitis (HCC)  Cocaine abuse (HCC)    GERD (gastroesophageal reflux disease)    Headache    Hyperlipidemia    Hypertension    OSA treated with BiPAP    ahi-132, bipap 23/19   Syncope    Tobacco abuse    Past Surgical History:  Procedure Laterality Date   ESOPHAGOGASTRODUODENOSCOPY (EGD) WITH PROPOFOL N/A 12/11/2012   Procedure: ESOPHAGOGASTRODUODENOSCOPY (EGD) WITH PROPOFOL;  Surgeon: Willis Modena, MD;  Location: [facility];  Service: Endoscopy;  Laterality: N/A;   FINGER AMPUTATION     KNEE ARTHROSCOPY Left 11/15/2015   Procedure: ARTHROSCOPY KNEE AND MEDIAL MENISECTOMY;   Surgeon: Durene Romans, MD;  Location: [facility];  Service: Orthopedics;  Laterality: Left;   KNEE ARTHROSCOPY WITH LATERAL MENISECTOMY Right 07/14/2020   Procedure: RIGHT KNEE PARTIAL LATERAL MENISCECTOMY;  Surgeon: Tarry Kos, MD;  Location: [facility];  Service: Orthopedics;  Laterality: Right;   Current Outpatient Medications on File Prior to Visit  Medication Sig Dispense Refill   ACCU-CHEK GUIDE test strip USE AS DIRECTED TO MONITOR FSBS 2 TIMES A DAY 100 strip 1   Accu-Chek Softclix Lancets lancets Use as instructed 100 each 12   AIMOVIG 140 MG/ML SOAJ INJECT 140 MG INTO THE SKIN EVERY 30 DAYS. NEED APPOINTMENT WITH PRIMARY CARE PROVIDER FOR REFILLS 1 mL 0   albuterol (VENTOLIN HFA) 108 (90 Base) MCG/ACT inhaler INHALE 2 PUFFS INTO THE LUNGS EVERY 4 HOURS AS NEEDED FOR SHORTNESS OF BREATH 8.5 g 2   Allopurinol 200 MG TABS Take 200 mg by mouth daily. TAKE 1 TAB (200MG )  PO DAILY 90 tablet 3   amLODipine (NORVASC) 5 MG tablet TAKE 1 TABLET (5 MG TOTAL) BY MOUTH DAILY. 90 tablet 0   aspirin 81 MG chewable tablet Chew 81 mg by mouth in the morning.     atorvastatin (LIPITOR) 40 MG tablet TAKE 1 TABLET (40 MG TOTAL) BY MOUTH DAILY. 90 tablet 3   blood glucose meter kit and supplies KIT Dispense based on patient and insurance preference. Use up to four times daily as directed. 1 each 11   Blood Glucose Monitoring Suppl (ACCU-CHEK AVIVA PLUS) w/Device KIT Use as directed to monitor FSBS 2x daily. Dx: E11.9 1 kit 1   celecoxib (CELEBREX) 200 MG capsule Take 1 capsule (200 mg total) by mouth 2 (two) times daily. 20 capsule 0   cyclobenzaprine (FLEXERIL) 10 MG tablet Take 0.5-1 tablets (5-10 mg total) by mouth 2 (two) times daily as needed for muscle spasms. 20 tablet 0   gabapentin (NEURONTIN) 300 MG capsule TAKE 1 CAPSULE BY MOUTH 3 TIMES DAILY AS NEEDED (NERVE PAIN). 90 capsule 3   glucose blood test strip 1 each by Other route 2 (two) times daily. Use as instructed 100 each 12    HYDROcodone-acetaminophen (NORCO) 5-325 MG tablet Take 1-2 tablets by mouth 2 (two) times daily as needed. 20 tablet 0   linaclotide (LINZESS) 145 MCG CAPS capsule Take 1 capsule (145 mcg total) by mouth daily before breakfast. 30 capsule 5   meloxicam (MOBIC) 15 MG tablet Take 1 tablet (15 mg total) by mouth daily. 30 tablet 0   metFORMIN (GLUCOPHAGE) 1000 MG tablet TAKE 1 TABLET BY MOUTH 2 TIMES DAILY WITH A MEAL. 180 tablet 3   nitroGLYCERIN (NITROSTAT) 0.4 MG SL tablet Place 1 tablet (0.4 mg total) under the tongue every 5 (five) minutes as needed. For chest pain (Patient taking differently: Place 0.4 mg under the tongue every 5 (five) minutes x 3 doses as needed  for chest pain.) 20 tablet 1   nystatin cream (MYCOSTATIN) Apply 1 Application topically 3 (three) times daily. 30 g 1   omeprazole (PRILOSEC) 40 MG capsule TAKE 1 CAPSULE BY MOUTH DAILY. 90 capsule 3   OZEMPIC, 0.25 OR 0.5 MG/DOSE, 2 MG/3ML SOPN INJECT 0.5 MG INTO THE SKIN ONCE A WEEK. 3 mL 1   senna-docusate (SENOKOT-S) 8.6-50 MG tablet Take 1 tablet by mouth daily as needed (constipation).     topiramate (TOPAMAX) 25 MG tablet TAKE 2 TABLETS (50 MG TOTAL) BY MOUTH 2 (TWO) TIMES DAILY. 120 tablet 5   Current Facility-Administered Medications on File Prior to Visit  Medication Dose Route Frequency Provider Last Rate Last Admin   lactated ringers infusion    Continuous PRN Donn Pierini, CRNA   New Bag at 11/15/15 1545   No Known Allergies Social History   Socioeconomic History   Marital status: Divorced    Spouse name: Not on file   Number of children: 0   Years of education: 12   Highest education level: 12th grade  Occupational History   Not on file  Tobacco Use   Smoking status: Every Day    Current packs/day: 0.50    Average packs/day: 0.5 packs/day for 40.0 years (20.0 ttl pk-yrs)    Types: Cigarettes    Passive exposure: Current   Smokeless tobacco: Never   Tobacco comments:    06/05/17 1 pack per 1 1/2  days    Smoking Cessation Classes, Agencies, Public librarian.  Vaping Use   Vaping status: Not on file  Substance and Sexual Activity   Alcohol use: Yes    Alcohol/week: 7.0 standard drinks of alcohol    Types: 7 Cans of beer per week   Drug use: Yes    Types: Cocaine   Sexual activity: Yes    Partners: Female  Other Topics Concern   Not on file  Social History Narrative   Lives with brother and his sister in [place].     Social Determinants of Health   Financial Resource Strain: Low Risk  (08/10/2022)   Overall Financial Resource Strain (CARDIA)    Difficulty of Paying Living Expenses: Not hard at all  Food Insecurity: No Food Insecurity (08/10/2022)   Hunger Vital Sign    Worried About Running Out of Food in the Last Year: Never true    Ran Out of Food in the Last Year: Never true  Transportation Needs: No Transportation Needs (08/10/2022)   PRAPARE - Administrator, Civil Service (Medical): No    Lack of Transportation (Non-Medical): No  Physical Activity: Inactive (08/10/2022)   Exercise Vital Sign    Days of Exercise per Week: 0 days    Minutes of Exercise per Session: 0 min  Stress: No Stress Concern Present (08/10/2022)   [employer] of Occupational Health - Occupational Stress Questionnaire    Feeling of Stress : Not at all  Social Connections: Moderately Integrated (08/10/2022)   Social Connection and Isolation Panel [NHANES]    Frequency of Communication with Friends and Family: More than three times a week    Frequency of Social Gatherings with Friends and Family: More than three times a week    Attends Religious Services: More than 4 times per year    Active Member of [employer] or Organizations: Yes    Attends Engineer, structural: More than 4 times per year    Marital Status: Divorced  Intimate Partner Violence: Not  At Risk (08/10/2022)   Humiliation, Afraid, Rape, and Kick questionnaire    Fear of Current or Ex-Partner: No     Emotionally Abused: No    Physically Abused: No    Sexually Abused: No     Review of Systems  All other systems reviewed and are negative.      Objective:   Physical Exam Vitals reviewed.  Constitutional:      Appearance: He is obese.  Cardiovascular:     Rate and Rhythm: Normal rate and regular rhythm.  Pulmonary:     Effort: Pulmonary effort is normal.     Breath sounds: Decreased air movement present. Wheezing present.  Musculoskeletal:     Left knee: No swelling or effusion. Decreased range of motion. Tenderness present.     Right foot: Decreased range of motion.  Feet:     Right foot:     Skin integrity: No skin breakdown or erythema.  Neurological:     Mental Status: He is alert.   21        Assessment & Plan:  Right foot pain - Plan: DG Foot Complete Right  Type 2 diabetes mellitus without complication, without long-term current use of insulin (HCC) - Plan: Hemoglobin A1c, COMPLETE METABOLIC PANEL WITH GFR  Acute pain of left knee  Shortness of breath I suspect his dyspnea is secondary to early emphysema.  Will going to try the patient on Advair 231/21, 2 puffs inhaled twice daily.  Reassess in 1 month to see if this is helping.  Encourage smoking cessation.  Proceed with an x-ray of his right foot for fracture of the great toe.  Recommended a postop shoe with weightbearing.  Check a hemoglobin A1c to determine management of his diabetes and control of his sugar.  Goal A1c is less than 6.5.  Suspect that the patient likely has osteoarthritis in his left knee.  Using sterile technique, I injected the left knee with 2 cc lidocaine, 2 cc of Marcaine, and 2 cc of 40 mg/mm Kenalog.

## 2022-10-06 LAB — COMPLETE METABOLIC PANEL WITHOUT GFR
AG Ratio: 1.2 (calc) (ref 1.0–2.5)
ALT: 22 U/L (ref 9–46)
AST: 14 U/L (ref 10–35)
Albumin: 4 g/dL (ref 3.6–5.1)
Alkaline phosphatase (APISO): 65 U/L (ref 35–144)
BUN: 19 mg/dL (ref 7–25)
CO2: 24 mmol/L (ref 20–32)
Calcium: 9.4 mg/dL (ref 8.6–10.3)
Chloride: 104 mmol/L (ref 98–110)
Creat: 0.94 mg/dL (ref 0.70–1.35)
Globulin: 3.4 g/dL (ref 1.9–3.7)
Glucose, Bld: 216 mg/dL — ABNORMAL HIGH (ref 65–99)
Potassium: 4.1 mmol/L (ref 3.5–5.3)
Sodium: 138 mmol/L (ref 135–146)
Total Bilirubin: 0.3 mg/dL (ref 0.2–1.2)
Total Protein: 7.4 g/dL (ref 6.1–8.1)
eGFR: 92 mL/min/1.73m2

## 2022-10-06 LAB — HEMOGLOBIN A1C
Hgb A1c MFr Bld: 9 % of total Hgb — ABNORMAL HIGH (ref <5.7)
Mean Plasma Glucose: 212 mg/dL
eAG (mmol/L): 11.7 mmol/L

## 2022-10-10 ENCOUNTER — Telehealth: Payer: Self-pay

## 2022-10-10 ENCOUNTER — Other Ambulatory Visit: Payer: Self-pay

## 2022-10-10 DIAGNOSIS — M7671 Peroneal tendinitis, right leg: Secondary | ICD-10-CM | POA: Diagnosis not present

## 2022-10-10 DIAGNOSIS — I739 Peripheral vascular disease, unspecified: Secondary | ICD-10-CM | POA: Diagnosis not present

## 2022-10-10 DIAGNOSIS — M722 Plantar fascial fibromatosis: Secondary | ICD-10-CM | POA: Diagnosis not present

## 2022-10-10 DIAGNOSIS — E1151 Type 2 diabetes mellitus with diabetic peripheral angiopathy without gangrene: Secondary | ICD-10-CM | POA: Diagnosis not present

## 2022-10-10 MED ORDER — EMPAGLIFLOZIN 25 MG PO TABS: 25.0000 mg | ORAL_TABLET | Freq: Every day | ORAL | 3 refills | Status: DC

## 2022-10-10 NOTE — Telephone Encounter (Signed)
Pt brought in a Diabetic Shoe Form to be completed by pcp. Pt would like for forms to be faxed to the number provided on form once completed. Pt stated that he does not need a copy of these once completed, but would like to be notified when this is done. Intake form and Diabetic shoe form placed in nurse's folder.  Cb#: [phone]

## 2022-10-12 ENCOUNTER — Other Ambulatory Visit: Payer: Self-pay

## 2022-10-12 LAB — MICROALBUMIN / CREATININE URINE RATIO: Microalb Creat Ratio: 29.999

## 2022-10-12 MED ORDER — SEMAGLUTIDE (1 MG/DOSE) 4 MG/3ML ~~LOC~~ SOPN: 1.0000 mg | PEN_INJECTOR | SUBCUTANEOUS | 3 refills | Status: DC

## 2022-10-13 ENCOUNTER — Other Ambulatory Visit: Payer: Self-pay

## 2022-10-13 MED ORDER — PIOGLITAZONE HCL 30 MG PO TABS: 30.0000 mg | ORAL_TABLET | Freq: Every day | ORAL | 3 refills | Status: DC

## 2022-10-20 ENCOUNTER — Other Ambulatory Visit (INDEPENDENT_AMBULATORY_CARE_PROVIDER_SITE_OTHER): Payer: 59

## 2022-10-20 DIAGNOSIS — M79671 Pain in right foot: Secondary | ICD-10-CM

## 2022-10-20 DIAGNOSIS — M25562 Pain in left knee: Secondary | ICD-10-CM

## 2022-10-20 MED ORDER — BUPIVACAINE HCL 0.25 % IJ SOLN
2.0000 mL | Freq: Once | INTRAMUSCULAR | Status: AC
Start: 2022-10-20 — End: 2022-10-13
  Administered 2022-10-13: 2 mL via INTRA_ARTICULAR

## 2022-10-20 MED ORDER — TRIAMCINOLONE ACETONIDE 40 MG/ML IJ SUSP
40.0000 mg | Freq: Once | INTRAMUSCULAR | Status: AC
Start: 2022-10-20 — End: 2022-10-13
  Administered 2022-10-13: 40 mg via INTRA_ARTICULAR

## 2022-10-20 MED ORDER — LIDOCAINE HCL (PF) 1 % IJ SOLN
2.0000 mL | Freq: Once | INTRAMUSCULAR | Status: AC
Start: 2022-10-20 — End: 2022-10-13
  Administered 2022-10-13: 2 mL

## 2022-10-28 ENCOUNTER — Other Ambulatory Visit: Payer: Self-pay | Admitting: Family Medicine

## 2022-10-30 ENCOUNTER — Encounter: Payer: 59 | Admitting: Pharmacist

## 2022-10-31 ENCOUNTER — Other Ambulatory Visit: Payer: Self-pay | Admitting: Family Medicine

## 2022-10-31 DIAGNOSIS — G4452 New daily persistent headache (NDPH): Secondary | ICD-10-CM

## 2022-11-01 DIAGNOSIS — G4733 Obstructive sleep apnea (adult) (pediatric): Secondary | ICD-10-CM | POA: Diagnosis not present

## 2022-11-01 NOTE — Telephone Encounter (Signed)
Last OV 10/09/22.  Requested Prescriptions  Pending Prescriptions Disp Refills   topiramate (TOPAMAX) 25 MG tablet [Pharmacy Med Name: TOPIRAMATE 25 MG TABS 25 Tablet] 360 tablet 0    Sig: TAKE 2 TABLETS BY MOUTH 2 TIMES DAILY.     Neurology: Anticonvulsants - topiramate & zonisamide Failed - 10/31/2022 11:45 AM      Failed - Valid encounter within last 12 months    Recent Outpatient Visits           1 year ago Right foot pain   [facility] Donita Brooks, MD   1 year ago Type 2 diabetes mellitus without complication, without long-term current use of insulin (HCC)   [facility] Pickard, Priscille Heidelberg, MD   1 year ago Chronic pain of right knee   [facility] Tanya Nones, Priscille Heidelberg, MD   1 year ago Right leg pain   [facility] Donita Brooks, MD   1 year ago Plantar fasciitis, right   [facility] Pickard, Priscille Heidelberg, MD              Passed - Cr in normal range and within 360 days    Creat  Date Value Ref Range Status  10/05/2022 0.94 0.70 - 1.35 mg/dL Final         Passed - CO2 in normal range and within 360 days    CO2  Date Value Ref Range Status  10/05/2022 24 20 - 32 mmol/L Final         Passed - ALT in normal range and within 360 days    ALT  Date Value Ref Range Status  10/05/2022 22 9 - 46 U/L Final         Passed - AST in normal range and within 360 days    AST  Date Value Ref Range Status  10/05/2022 14 10 - 35 U/L Final         Passed - Completed PHQ-2 or PHQ-9 in the last 360 days       metFORMIN (GLUCOPHAGE) 1000 MG tablet [Pharmacy Med Name: METFORMIN HCL 1000 MG TABS 1000 Tablet] 180 tablet 0    Sig: TAKE 1 TABLET BY MOUTH 2 TIMES DAILY WITH A MEAL.     Endocrinology:  Diabetes - Biguanides Failed - 10/31/2022 11:45 AM      Failed - HBA1C is between 0 and 7.9 and within 180 days    Hgb A1C (fingerstick)  Date Value Ref Range Status  01/28/2016 >14.0 (H) <5.7 % Final     Comment:                                                                           According to the ADA Clinical Practice Recommendations for 2011, when HbA1c is used as a screening test:     >=6.5%   Diagnostic of Diabetes Mellitus            (if abnormal result is confirmed)   5.7-6.4%   Increased risk of developing Diabetes Mellitus   References:Diagnosis and Classification of Diabetes Mellitus,Diabetes Care,2011,34(Suppl 1):S62-S69 and Standards of Medical Care in  Diabetes - 2011,Diabetes Care,2011,34 (Suppl 1):S11-S61.      Hgb A1c MFr Bld  Date Value Ref Range Status  10/05/2022 9.0 (H) <5.7 % of total Hgb Final    Comment:    For someone without known diabetes, a hemoglobin A1c value of 6.5% or greater indicates that they may have  diabetes and this should be confirmed with a follow-up  test. . For someone with known diabetes, a value <7% indicates  that their diabetes is well controlled and a value  greater than or equal to 7% indicates suboptimal  control. A1c targets should be individualized based on  duration of diabetes, age, comorbid conditions, and  other considerations. . Currently, no consensus exists regarding use of hemoglobin A1c for diagnosis of diabetes for children. .          Failed - B12 Level in normal range and within 720 days    No results found for: "VITAMINB12"       Failed - Valid encounter within last 6 months    Recent Outpatient Visits           1 year ago Right foot pain   [facility] Tanya Nones, Priscille Heidelberg, MD   1 year ago Type 2 diabetes mellitus without complication, without long-term current use of insulin (HCC)   [facility] Pickard, Priscille Heidelberg, MD   1 year ago Chronic pain of right knee   [facility] Donita Brooks, MD   1 year ago Right leg pain   [facility] Donita Brooks, MD   1 year ago Plantar fasciitis, right   [facility]  Tanya Nones, Priscille Heidelberg, MD              Failed - CBC within normal limits and completed in the last 12 months    WBC  Date Value Ref Range Status  07/25/2021 6.2 3.8 - 10.8 Thousand/uL Final   RBC  Date Value Ref Range Status  07/25/2021 5.82 (H) 4.20 - 5.80 Million/uL Final   Hemoglobin  Date Value Ref Range Status  07/25/2021 16.1 13.2 - 17.1 g/dL Final   HCT  Date Value Ref Range Status  07/25/2021 50.8 (H) 38.5 - 50.0 % Final   MCHC  Date Value Ref Range Status  07/25/2021 31.7 (L) 32.0 - 36.0 g/dL Final   [facility]  Date Value Ref Range Status  07/25/2021 27.7 27.0 - 33.0 pg Final   MCV  Date Value Ref Range Status  07/25/2021 87.3 80.0 - 100.0 fL Final   No results found for: "PLTCOUNTKUC", "LABPLAT", "POCPLA" RDW  Date Value Ref Range Status  07/25/2021 14.1 11.0 - 15.0 % Final         Passed - Cr in normal range and within 360 days    Creat  Date Value Ref Range Status  10/05/2022 0.94 0.70 - 1.35 mg/dL Final         Passed - eGFR in normal range and within 360 days    GFR, Est African American  Date Value Ref Range Status  12/18/2019 110 > OR = 60 mL/min/1.30m2 Final   GFR, Est Non African American  Date Value Ref Range Status  12/18/2019 95 > OR = 60 mL/min/1.26m2 Final   GFR, Estimated  Date Value Ref Range Status  06/15/2021 >60 >60 mL/min Final    Comment:    (NOTE) Calculated using the CKD-EPI Creatinine Equation (2021)    eGFR  Date Value Ref Range Status  10/05/2022 92 > OR = 60 mL/min/1.74m2 Final

## 2022-11-03 ENCOUNTER — Telehealth: Payer: Self-pay | Admitting: Family Medicine

## 2022-11-03 ENCOUNTER — Other Ambulatory Visit: Payer: Self-pay | Admitting: Family Medicine

## 2022-11-03 ENCOUNTER — Other Ambulatory Visit: Payer: Self-pay

## 2022-11-03 DIAGNOSIS — G4452 New daily persistent headache (NDPH): Secondary | ICD-10-CM

## 2022-11-03 MED ORDER — AIMOVIG 140 MG/ML ~~LOC~~ SOAJ: 140.0000 mg | SUBCUTANEOUS | 2 refills | Status: DC

## 2022-11-03 NOTE — Telephone Encounter (Signed)
Requested medication (s) are due for refill today - yes  Requested medication (s) are on the active medication list -yes  Future visit scheduled -no  Last refill: 09/26/22 1ml  Notes to clinic: off protocol- provider review - last RF has notes  Requested Prescriptions  Pending Prescriptions Disp Refills   AIMOVIG 140 MG/ML SOAJ [Pharmacy Med Name: AIMOVIG 140 MG/ML SOAJ 140 Solution Auto-injector] 1 mL 0    Sig: INJECT 140 MG INTO THE SKIN EVERY 30 DAYS. NEED APPOINTMENT WITH PRIMARY CARE PROVIDER FOR REFILLS     Off-Protocol Failed - 11/03/2022  9:08 AM      Failed - Medication not assigned to a protocol, review manually.      Failed - Valid encounter within last 12 months    Recent Outpatient Visits           1 year ago Right foot pain   [facility] Pickard, Priscille Heidelberg, MD   1 year ago Type 2 diabetes mellitus without complication, without long-term current use of insulin (HCC)   [facility] Pickard, Priscille Heidelberg, MD   1 year ago Chronic pain of right knee   [facility] Pickard, Priscille Heidelberg, MD   1 year ago Right leg pain   [facility] Tanya Nones, Priscille Heidelberg, MD   1 year ago Plantar fasciitis, right   [facility] Tanya Nones Priscille Heidelberg, MD                 Requested Prescriptions  Pending Prescriptions Disp Refills   AIMOVIG 140 MG/ML SOAJ [Pharmacy Med Name: AIMOVIG 140 MG/ML SOAJ 140 Solution Auto-injector] 1 mL 0    Sig: INJECT 140 MG INTO THE SKIN EVERY 30 DAYS. NEED APPOINTMENT WITH PRIMARY CARE PROVIDER FOR REFILLS     Off-Protocol Failed - 11/03/2022  9:08 AM      Failed - Medication not assigned to a protocol, review manually.      Failed - Valid encounter within last 12 months    Recent Outpatient Visits           1 year ago Right foot pain   [facility] Donita Brooks, MD   1 year ago Type 2 diabetes mellitus without complication, without long-term current use of  insulin (HCC)   [facility] Pickard, Priscille Heidelberg, MD   1 year ago Chronic pain of right knee   [facility] Tanya Nones Priscille Heidelberg, MD   1 year ago Right leg pain   [facility] Pickard, Priscille Heidelberg, MD   1 year ago Plantar fasciitis, right   [facility] Pickard, Priscille Heidelberg, MD

## 2022-11-03 NOTE — Telephone Encounter (Signed)
Patient called to follow up on monthly shot for migraines; refill denied by pharmacy. Patient unsure of reason.  Pharmacy confirmed as   [facility] - [place], [place] - [address] [place] [address] Phone: [phone]  Fax: [phone]   Please advise at [phone].

## 2022-11-07 ENCOUNTER — Other Ambulatory Visit: Payer: Self-pay

## 2022-11-07 ENCOUNTER — Other Ambulatory Visit: Payer: Self-pay | Admitting: Family Medicine

## 2022-11-07 MED ORDER — GABAPENTIN 300 MG PO CAPS: ORAL_CAPSULE | ORAL | 0 refills | Status: DC

## 2022-11-08 NOTE — Telephone Encounter (Signed)
Rx filled 11/07/22 Requested Prescriptions  Pending Prescriptions Disp Refills   gabapentin (NEURONTIN) 300 MG capsule [Pharmacy Med Name: GABAPENTIN 300 MG CAPS 300 Capsule] 90 capsule 3    Sig: TAKE 1 CAPSULE BY MOUTH 3 TIMES DAILY AS NEEDED (NERVE PAIN).     Neurology: Anticonvulsants - gabapentin Failed - 11/07/2022  9:18 AM      Failed - Valid encounter within last 12 months    Recent Outpatient Visits           1 year ago Right foot pain   [facility] Tanya Nones, Priscille Heidelberg, MD   1 year ago Type 2 diabetes mellitus without complication, without long-term current use of insulin (HCC)   [facility] Pickard, Priscille Heidelberg, MD   1 year ago Chronic pain of right knee   [facility] Tanya Nones, Priscille Heidelberg, MD   1 year ago Right leg pain   [facility] Tanya Nones, Priscille Heidelberg, MD   1 year ago Plantar fasciitis, right   [facility] Pickard, Priscille Heidelberg, MD       Future Appointments             In 2 weeks Pickard, Priscille Heidelberg, MD  [facility]            Passed - Cr in normal range and within 360 days    Creat  Date Value Ref Range Status  10/05/2022 0.94 0.70 - 1.35 mg/dL Final         Passed - Completed PHQ-2 or PHQ-9 in the last 360 days

## 2022-11-22 ENCOUNTER — Other Ambulatory Visit: Payer: 59

## 2022-11-23 ENCOUNTER — Ambulatory Visit: Payer: 59 | Admitting: Family Medicine

## 2022-12-01 IMAGING — MR MR KNEE*R* W/O CM
6 series · 40 of 40 positions shown · non-contrast
Comparison: Plain films right knee 05/08/2020. MRI right knee
05/25/2009.

CLINICAL DATA: Worsening chronic right knee pain.

EXAM:
MRI OF THE RIGHT KNEE WITHOUT CONTRAST
TECHNIQUE: Multiplanar, multisequence MR imaging of the knee was performed. No
intravenous contrast was administered.

[Series 6: T2 fat-sat · axial · right · 4.0mm · 0.50mm/px · z∈[-71,+82]mm · 8 of 36 slices shown (1 of 3)]
[im 1/36]
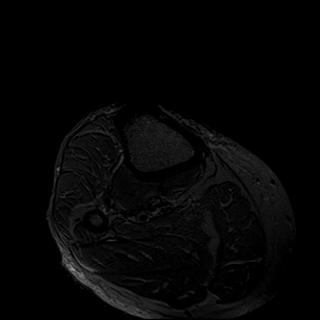
[im 6/36]
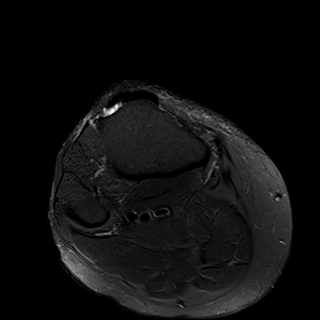
[im 11/36]
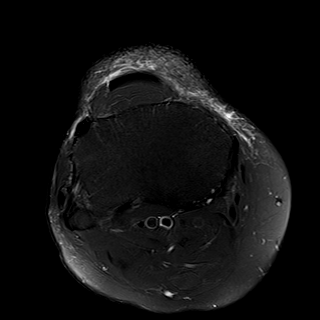
[im 16/36]
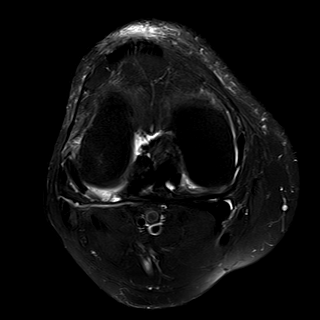
[im 21/36]
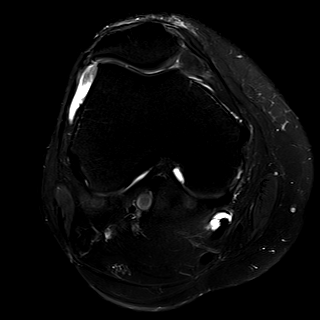
[im 26/36]
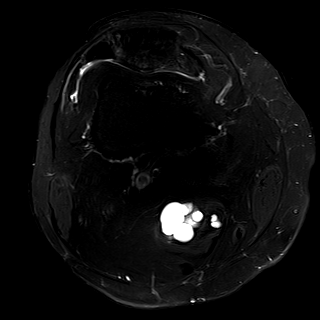
[im 31/36]
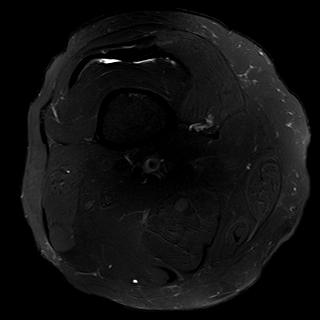
[im 36/36]
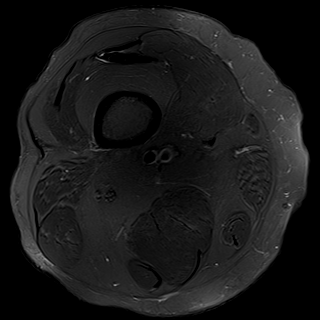

[Series 7: T2 fat-sat · coronal · right · 4.0mm · 0.47mm/px · 6 of 28 slices shown (2 of 3)]
[im 1/28]
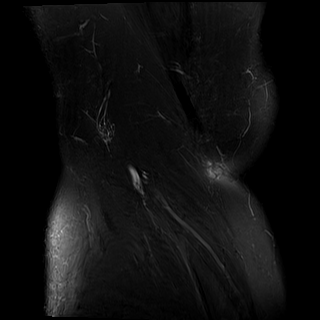
[im 6/28]
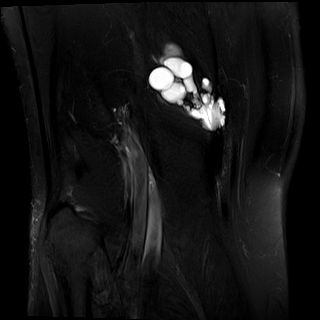
[im 11/28]
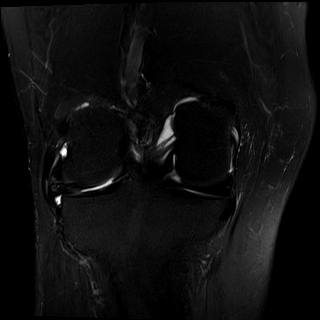
[im 17/28]
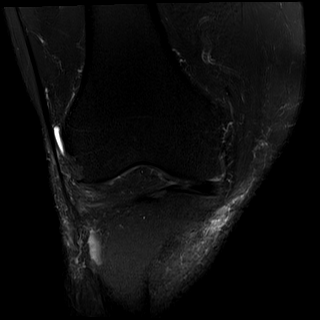
[im 22/28]
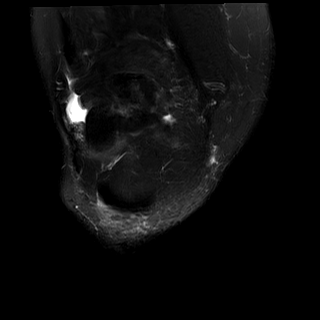
[im 28/28]
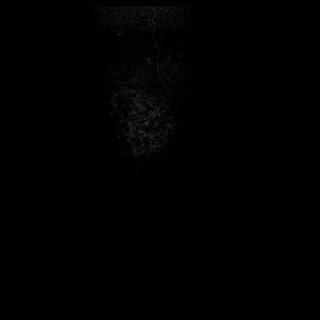

[Series 8: T1 · coronal · right · 4.0mm · 0.47mm/px · 6 of 28 slices shown]
[im 1/28]
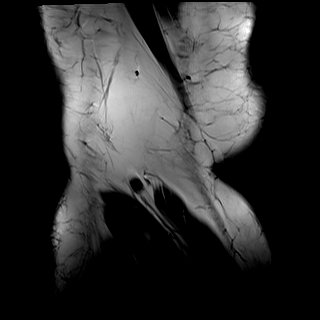
[im 6/28]
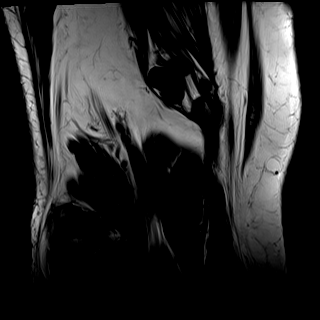
[im 11/28]
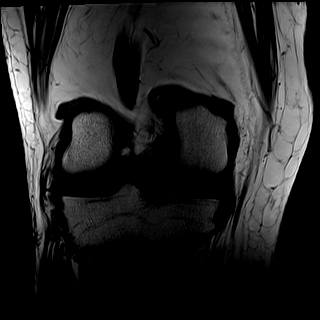
[im 17/28]
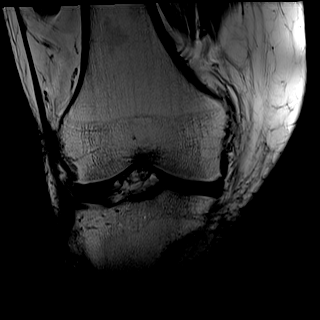
[im 22/28]
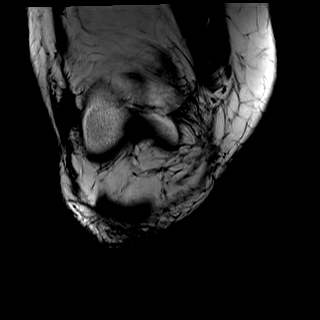
[im 28/28]
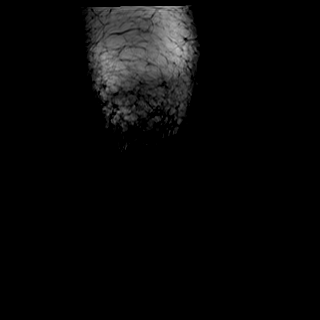

[Series 9: PD fat-sat · coronal · right · 3.0mm · 0.47mm/px · 6 of 28 slices shown (1 of 2)]
[im 1/28]
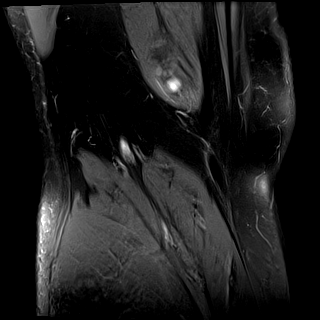
[im 6/28]
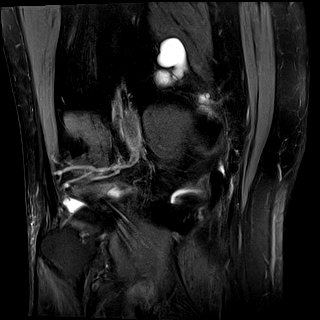
[im 11/28]
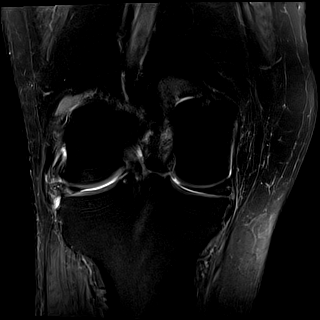
[im 17/28]
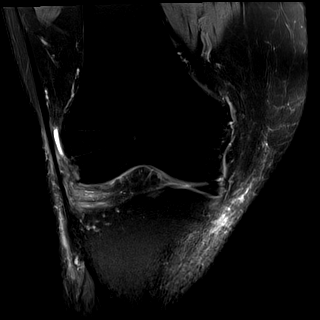
[im 22/28]
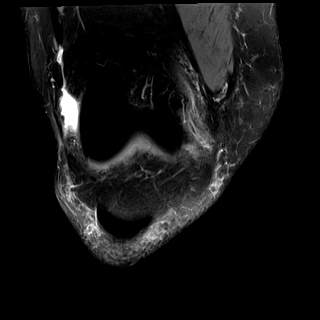
[im 28/28]
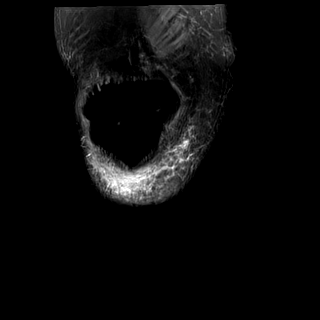

[Series 10: PD fat-sat · sagittal · right · 3.0mm · 0.47mm/px · 7 of 30 slices shown (2 of 2)]
[im 1/30]
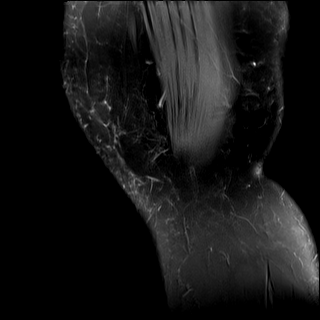
[im 5/30]
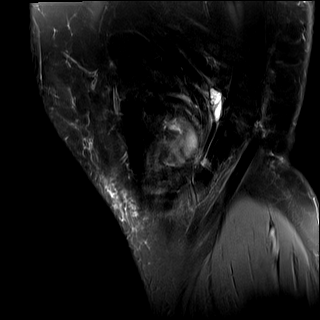
[im 10/30]
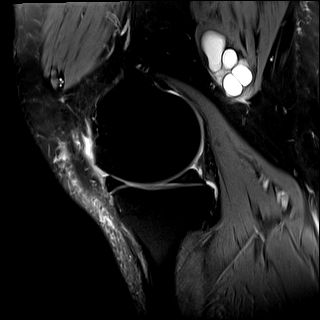
[im 15/30]
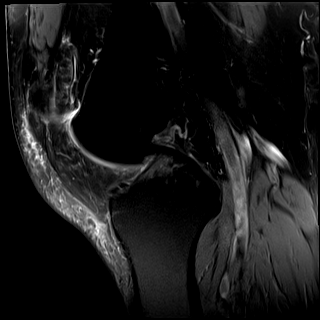
[im 20/30]
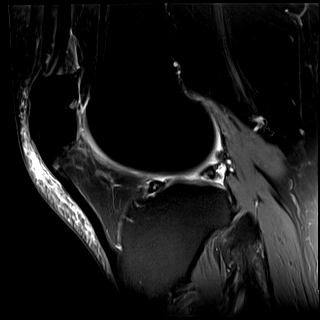
[im 25/30]
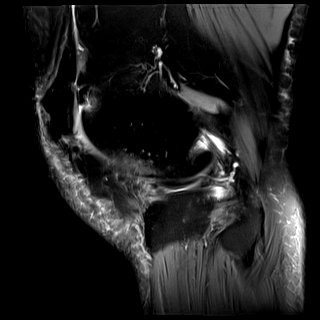
[im 30/30]
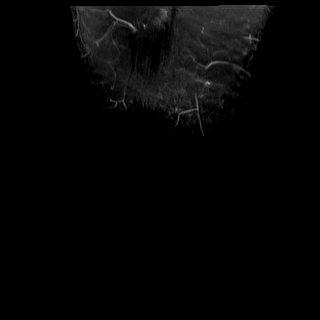

[Series 11: T2 fat-sat · sagittal · right · 3.0mm · 0.47mm/px · 7 of 30 slices shown (3 of 3)]
[im 1/30]
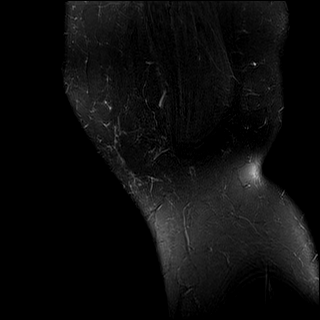
[im 5/30]
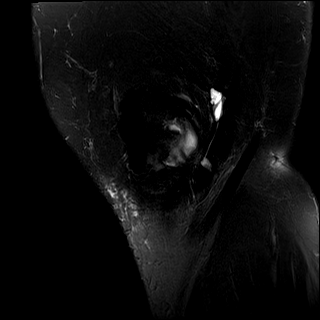
[im 10/30]
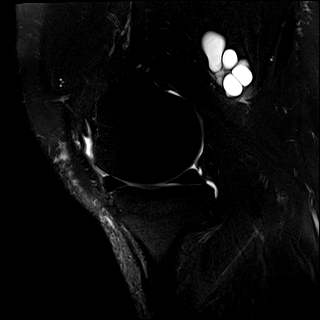
[im 15/30]
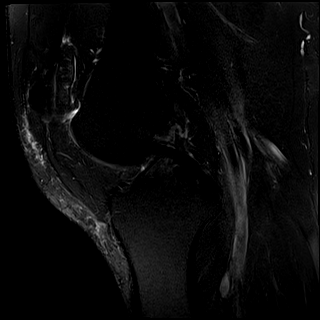
[im 20/30]
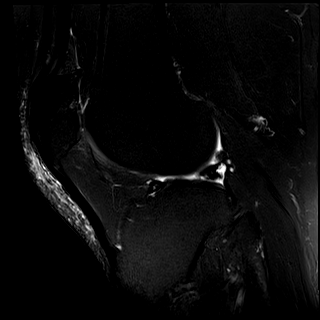
[im 25/30]
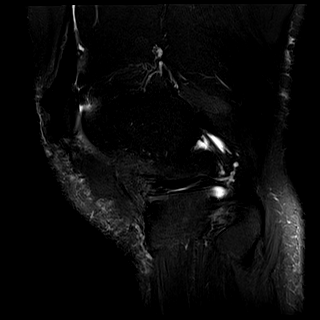
[im 30/30]
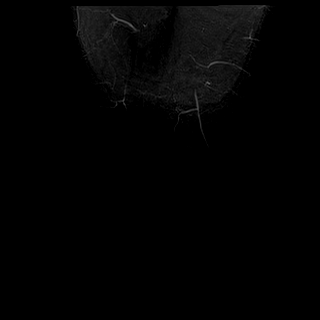

[40 of 40 positions shown; findings below may reference images not displayed]

FINDINGS: MENISCI

Medial meniscus: Intact. There is some intrasubstance degenerative
signal in the meniscus.

Lateral meniscus: A large horizontal tear in the posterior horn and
posterior body is new since the prior examination. A horizontal tear
in the anterior body is also new since the prior study.

LIGAMENTS

Cruciates:  Intact.

Collaterals:  Intact.

CARTILAGE

Patellofemoral: Marked cartilage loss at the apex of the patella and
along the lateral facet in the superior and mid poles is worse than
on the prior study.

Medial:  Mildly degenerated, somewhat worse than on the prior study.

Lateral: Mildly degenerated, somewhat worse than on the prior study.

Joint:  Small effusion.

Popliteal Fossa: Multi-septated Baker's cyst dissecting into the
semimembranosus muscle belly measures up to 2 cm transverse by
cm AP by 4.2 cm craniocaudal and is unchanged.

Extensor Mechanism:  Intact.

Bones: No fracture or worrisome lesion. Mild osteophytosis about the
knee and small subchondral cysts in the patella noted.

Other: None.
IMPRESSION: Worsened appearance of extensive tearing of the lateral meniscus as
described above.

Moderate to moderately severe patellofemoral osteoarthritis has
progressed since the prior examination. Very mild medial and lateral
compartment degenerative change has also progressed.

No change in a Baker's cyst dissecting into the substance of the
semimembranosus.

## 2022-12-15 ENCOUNTER — Encounter: Payer: Self-pay | Admitting: Family Medicine

## 2022-12-26 DIAGNOSIS — I739 Peripheral vascular disease, unspecified: Secondary | ICD-10-CM | POA: Diagnosis not present

## 2022-12-26 DIAGNOSIS — M722 Plantar fascial fibromatosis: Secondary | ICD-10-CM | POA: Diagnosis not present

## 2022-12-26 DIAGNOSIS — E1151 Type 2 diabetes mellitus with diabetic peripheral angiopathy without gangrene: Secondary | ICD-10-CM | POA: Diagnosis not present

## 2022-12-26 DIAGNOSIS — M7671 Peroneal tendinitis, right leg: Secondary | ICD-10-CM | POA: Diagnosis not present

## 2023-01-17 ENCOUNTER — Other Ambulatory Visit: Payer: 59

## 2023-01-22 ENCOUNTER — Other Ambulatory Visit: Payer: Self-pay

## 2023-01-22 ENCOUNTER — Telehealth: Payer: Self-pay | Admitting: Family Medicine

## 2023-01-22 ENCOUNTER — Other Ambulatory Visit: Payer: 59

## 2023-01-22 ENCOUNTER — Other Ambulatory Visit: Payer: Self-pay | Admitting: Family Medicine

## 2023-01-22 DIAGNOSIS — E782 Mixed hyperlipidemia: Secondary | ICD-10-CM | POA: Diagnosis not present

## 2023-01-22 DIAGNOSIS — I10 Essential (primary) hypertension: Secondary | ICD-10-CM | POA: Diagnosis not present

## 2023-01-22 DIAGNOSIS — J45909 Unspecified asthma, uncomplicated: Secondary | ICD-10-CM

## 2023-01-22 DIAGNOSIS — E119 Type 2 diabetes mellitus without complications: Secondary | ICD-10-CM

## 2023-01-22 DIAGNOSIS — E1169 Type 2 diabetes mellitus with other specified complication: Secondary | ICD-10-CM | POA: Diagnosis not present

## 2023-01-22 MED ORDER — GABAPENTIN 300 MG PO CAPS: ORAL_CAPSULE | ORAL | 5 refills | Status: DC

## 2023-01-22 MED ORDER — FLUTICASONE-SALMETEROL 230-21 MCG/ACT IN AERO: 2.0000 | INHALATION_SPRAY | Freq: Two times a day (BID) | RESPIRATORY_TRACT | 3 refills | Status: DC

## 2023-01-22 MED ORDER — ALBUTEROL SULFATE HFA 108 (90 BASE) MCG/ACT IN AERS
INHALATION_SPRAY | RESPIRATORY_TRACT | 2 refills | Status: DC
Start: 2023-01-22 — End: 2023-06-13

## 2023-01-22 NOTE — Telephone Encounter (Signed)
Prescription Request  01/22/2023  LOV: 10/05/2022  What is the name of the medication or equipment?   albuterol (VENTOLIN HFA) 108 (90 Base) MCG/ACT inhaler [[id number]]   fluticasone-salmeterol (ADVAIR HFA) 230-21 MCG/ACT inhaler [[id number]]   gabapentin (NEURONTIN) 300 MG capsule [[id number]]   **Patient also needs refill of monthly shot he receives for headaches**  **Patient thought tomorrow's appointment was for today; stated he's unavailable to come back tomorrow.   **Patient requesting refills of the following:    Have you contacted your pharmacy to request a refill? Yes   Which pharmacy would you like this sent to?  [facility] - [place], [place] - [address] [place] [address] Phone: [phone] Fax: [phone]    Patient notified that their request is being sent to the clinical staff for review and that they should receive a response within 2 business days.   Please advise patient at [phone].

## 2023-01-23 ENCOUNTER — Ambulatory Visit: Payer: 59 | Admitting: Family Medicine

## 2023-01-23 LAB — COMPLETE METABOLIC PANEL WITH GFR
AG Ratio: 1.3 (calc) (ref 1.0–2.5)
ALT: 14 U/L (ref 9–46)
AST: 13 U/L (ref 10–35)
Albumin: 4.2 g/dL (ref 3.6–5.1)
Alkaline phosphatase (APISO): 63 U/L (ref 35–144)
BUN: 16 mg/dL (ref 7–25)
CO2: 26 mmol/L (ref 20–32)
Calcium: 9.2 mg/dL (ref 8.6–10.3)
Chloride: 105 mmol/L (ref 98–110)
Creat: 0.84 mg/dL (ref 0.70–1.35)
Globulin: 3.2 g/dL (ref 1.9–3.7)
Glucose, Bld: 104 mg/dL — ABNORMAL HIGH (ref 65–99)
Potassium: 4.3 mmol/L (ref 3.5–5.3)
Sodium: 141 mmol/L (ref 135–146)
Total Bilirubin: 0.3 mg/dL (ref 0.2–1.2)
Total Protein: 7.4 g/dL (ref 6.1–8.1)
eGFR: 99 mL/min/{1.73_m2} (ref >=60)

## 2023-01-23 LAB — CBC WITH DIFFERENTIAL/PLATELET
Absolute Lymphocytes: 2904 {cells}/uL (ref 850–3900)
Absolute Monocytes: 687 {cells}/uL (ref 200–950)
Basophils Absolute: 48 {cells}/uL (ref 0–200)
Basophils Relative: 0.7 %
Eosinophils Absolute: 82 {cells}/uL (ref 15–500)
Eosinophils Relative: 1.2 %
HCT: 47.9 % (ref 38.5–50.0)
Hemoglobin: 14.8 g/dL (ref 13.2–17.1)
MCH: 26.4 pg — ABNORMAL LOW (ref 27.0–33.0)
MCHC: 30.9 g/dL — ABNORMAL LOW (ref 32.0–36.0)
MCV: 85.5 fL (ref 80.0–100.0)
MPV: 11.2 fL (ref 7.5–12.5)
Monocytes Relative: 10.1 %
Neutro Abs: 3080 {cells}/uL (ref 1500–7800)
Neutrophils Relative %: 45.3 %
Platelets: 270 10*3/uL (ref 140–400)
RBC: 5.6 10*6/uL (ref 4.20–5.80)
RDW: 13.7 % (ref 11.0–15.0)
Total Lymphocyte: 42.7 %
WBC: 6.8 10*3/uL (ref 3.8–10.8)

## 2023-01-23 LAB — HEMOGLOBIN A1C
Hgb A1c MFr Bld: 7.2 %{Hb} — ABNORMAL HIGH (ref <5.7)
Mean Plasma Glucose: 160 mg/dL
eAG (mmol/L): 8.9 mmol/L

## 2023-01-23 LAB — LIPID PANEL
Cholesterol: 157 mg/dL (ref <200)
HDL: 64 mg/dL (ref >=40)
LDL Cholesterol (Calc): 79 mg/dL
Non-HDL Cholesterol (Calc): 93 mg/dL (ref <130)
Total CHOL/HDL Ratio: 2.5 (calc) (ref <5.0)
Triglycerides: 61 mg/dL (ref <150)

## 2023-01-25 ENCOUNTER — Other Ambulatory Visit: Payer: Self-pay | Admitting: Family Medicine

## 2023-01-25 DIAGNOSIS — G4452 New daily persistent headache (NDPH): Secondary | ICD-10-CM

## 2023-01-25 NOTE — Telephone Encounter (Signed)
Requested medications are due for refill today.  yes  Requested medications are on the active medications list.  yes  Last refill. 11/03/2022 1mL 2 rf  Future visit scheduled.   no  Notes to clinic.  Medication not assigned to a protocol. Please review for refill.    Requested Prescriptions  Pending Prescriptions Disp Refills   AIMOVIG 140 MG/ML SOAJ [Pharmacy Med Name: AIMOVIG 140 MG/ML SOAJ 140 Solution Auto-injector] 1 mL 2    Sig: Inject 140 mg into the skin every 30 (thirty) days.     Off-Protocol Failed - 01/25/2023 12:12 PM      Failed - Medication not assigned to a protocol, review manually.      Failed - Valid encounter within last 12 months    Recent Outpatient Visits           1 year ago Right foot pain   [facility] Donita Brooks, MD   1 year ago Type 2 diabetes mellitus without complication, without long-term current use of insulin (HCC)   [facility] Pickard, Priscille Heidelberg, MD   1 year ago Chronic pain of right knee   [facility] Tanya Nones Priscille Heidelberg, MD   1 year ago Right leg pain   [facility] Tanya Nones, Priscille Heidelberg, MD   2 years ago Plantar fasciitis, right   [facility] Pickard, Priscille Heidelberg, MD

## 2023-02-02 DIAGNOSIS — G4733 Obstructive sleep apnea (adult) (pediatric): Secondary | ICD-10-CM | POA: Diagnosis not present

## 2023-02-05 ENCOUNTER — Other Ambulatory Visit: Payer: Self-pay

## 2023-02-05 ENCOUNTER — Ambulatory Visit: Payer: Self-pay | Admitting: Family Medicine

## 2023-02-05 DIAGNOSIS — G4452 New daily persistent headache (NDPH): Secondary | ICD-10-CM

## 2023-02-05 MED ORDER — AIMOVIG 140 MG/ML ~~LOC~~ SOAJ: 140.0000 mg | SUBCUTANEOUS | 2 refills | Status: DC

## 2023-02-05 NOTE — Telephone Encounter (Signed)
Agent transferred call to nurse. Pt called stating he is out of medication for migraine and request refill.  Agent already documented pt information and nurse notate care advice. Information will be routed to provider clinic

## 2023-02-05 NOTE — Telephone Encounter (Signed)
Copied from CRM [id number]. Topic: Clinical - Medication Refill >> Feb 05, 2023 11:02 AM Claudine Mouton wrote: Most Recent Primary Care Visit:  Provider: [facility]  Department: [facility]  Visit Type: LAB  Date: 01/22/2023  Medication: aimovig 140mg /ml soaj 1x per mnth  Has the patient contacted their pharmacy? Yes (Agent: If no, request that the patient contact the pharmacy for the refill. If patient does not wish to contact the pharmacy document the reason why and proceed with request.) (Agent: If yes, when and what did the pharmacy advise?)  Is this the correct pharmacy for this prescription? Yes If no, delete pharmacy and type the correct one.  This is the patient's preferred pharmacy:  [facility] - [place], [place] - [address] [place] [address] Phone: [phone] Fax: [phone]   Has the prescription been filled recently? No  Is the patient out of the medication? Yes  Has the patient been seen for an appointment in the last year OR does the patient have an upcoming appointment? No  Can we respond through MyChart? Yes  Agent: Please be advised that Rx refills may take up to 3 business days. We ask that you follow-up with your pharmacy. Reason for Disposition . [1] Caller requesting a prescription renewal (no refills left), no triage required, AND [2] triager able to renew prescription per department policy . [1] Prescription refill request for ESSENTIAL medicine (i.e., likelihood of harm to patient if not taken) AND [2] triager unable to refill per department policy  Answer Assessment - Initial Assessment Questions 1. DRUG NAME: "What medicine do you need to have refilled?"     aimovig 140mg /ml soaj 1x per mnth    2. REFILLS REMAINING: "How many refills are remaining?" (Note: The label on the medicine or pill bottle will show how many refills are remaining. If there are no refills remaining, then a renewal may be  needed.)     No refills remaining 3. EXPIRATION DATE: "What is the expiration date?" (Note: The label states when the prescription will expire, and thus can no longer be refilled.)     N/a 4. PRESCRIBING HCP: "Who prescribed it?" Reason: If prescribed by specialist, call should be referred to that group.     [place] 5. SYMPTOMS: "Do you have any symptoms?"     Headache/miagraine 6. PREGNANCY: "Is there any chance that you are pregnant?" "When was your last menstrual period?"     no  Protocols used: Medication Refill and Renewal Call-A-AH

## 2023-02-21 ENCOUNTER — Telehealth: Payer: Self-pay

## 2023-02-21 NOTE — Telephone Encounter (Signed)
Copied from CRM [id number]. Topic: Clinical - Medication Question >> Feb 21, 2023  8:15 AM Desma Mcgregor wrote: Reason for CRM: Pt asking if his dosage for Erenumab-aooe (AIMOVIG) 140 MG/ML SOAJ can be increased. He is getting headaches at the end of the month now and never did before. CB# [phone]

## 2023-03-07 ENCOUNTER — Encounter: Payer: Self-pay | Admitting: Neurology

## 2023-03-07 ENCOUNTER — Ambulatory Visit (INDEPENDENT_AMBULATORY_CARE_PROVIDER_SITE_OTHER): Payer: 59 | Admitting: Neurology

## 2023-03-07 VITALS — BP 126/75 | HR 90 | Ht 66.0 in | Wt 286.0 lb

## 2023-03-07 DIAGNOSIS — G4733 Obstructive sleep apnea (adult) (pediatric): Secondary | ICD-10-CM

## 2023-03-07 NOTE — Progress Notes (Signed)
Subjective:    Patient ID: Kenneth [name] is a 62 y.o. male.  HPI    Interim history:   Kenneth [name] is a 62 year old right-handed gentleman with an underlying medical history of hyperlipidemia, coronary artery disease, reflux disease, morbid obesity with a BMI of over 45, headaches, arthritis, chronic bronchitis in the setting of smoking, history of syncopal event, who presents for follow-up consultation of his obstructive sleep apnea, on BiPAP therapy. The patient is unaccompanied today and presents for his one year FU appointment. I last saw him on 03/08/22, at which time he was mildly suboptimal with his BiPAP compliance.  He was motivated to continue with treatment and had received a new machine in 2022 per his report.    Today, 03/07/2023, reviewed his BiPAP compliance data from 02/04/2023 through 03/05/2023, which is a total of 30 days, during which time he used his machine 24 days with percent use days greater than 4 hours at 60%, indicating mildly suboptimal compliance, average usage for days on treatment of 8 hours and minutes, residual AHI at goal at 0.6/h, leak on the high side with the 95th percentile at 49.7 L/min on a BiPAP of 21/17 cm.  He reports overall doing well with his BiPAP.  He admits that he does not take his machine with him when he stays at his girlfriend's house.  He is doing quite well with treatment otherwise.   The patient's allergies, current medications, family history, past medical history, past social history, past surgical history and problem list were reviewed and updated as appropriate.    Previously:   I reviewed his BiPAP compliance data from 02/05/2022 through 03/06/2022, which is a total of 30 days, during which time he used his machine 27 days with percent use days greater than 4 hours at 67%, indicating slightly suboptimal compliance, average usage for days on treatment of 6 hours and 47 minutes, residual AHI at goal at 0.6/h, leak on the higher side with the  95th percentile at 24.5 L/min on a BiPAP pressure of 21 over 17 cm.     I saw him on 03/08/2021, at which time he reported feeling stable. We proceeded with a sleep study for reevaluation.  He had a baseline sleep study on 04/10/2021 which showed an AHI of 26.9/h, O2 nadir 72%.  He was advised to continue with BiPAP therapy at home.     I saw him on 12/05/2016, at which time he was not fully compliant with his BiPAP.  He was bothered by significant arthritis pain.  He was encouraged to be compliant with his BiPAP.   He saw Butch Penny, NP, on 06/05/2017, at which time he was better with his BiPAP compliance.  He was advised to follow-up in 6 months.  He saw Butch Penny, NP on 12/12/2017, at which time he was intermittent with his BiPAP compliance.  He was encouraged to be fully compliant.   He saw Butch Penny, NP on 12/16/2018, at which time he was better with his BiPAP compliance but not fully optimal.  He noticed mask leaking.  He recently had received new supplies however.   He saw Butch Penny, NP on 12/16/2019, at which time he was very good with his overall usage of the machine but not consistently on it for more than 4 hours.  He was advised to be fully compliant.   I reviewed his BiPAP compliance data from 02/04/2021 through 03/05/2021, which is a total of 30 days, during which time he used  his machine 23 days, percent use days greater than 4 hours at 50%, indicating mildly suboptimal compliance, average usage for days on treatment of 4 hours and 49 minutes, residual AHI at goal at 0.8/h, leak acceptable with fluctuating numbers, 95th percentile of leak at 15.2 L/min, pressure of 21/17 cm.      I saw him on 01/18/2016, at which time he was poorly compliant with BiPAP therapy with a percentage of 23%. He had recent left knee surgery under Dr. Charlann Boxer on 11/15/2015. He was still smoking and was taking hydrocodone as needed.    He was seen in the interim by Butch Penny, NP, on 05/22/16,  at which time his compliance was a little better in the lower 40s but he was again advised to be fully compliant with treatment.    I reviewed his BiPAP compliance data from 11/05/2016 through 12/04/2016 which is a total of 30 days, during which time he used his BiPAP 21 days with percent used days greater than 4 hours at 40% only, indicating poor compliance with an average usage of 4 hours and 2 minutes for days on treatment, average AHI 1.3 per hour which is at goal, leak low with the 95th percentile at 1 L per minute, pressure of 29/19 cm.   I reviewed his BiPAP compliance data from 10/21/2016 through 11/19/2016, which is a total of 30 days, during which time he used his BiPAP only 21 days with percent used days greater than 4 hours at only 30%, indicating poor compliance, average usage of 3 hours and 40 minutes 4 days on treatment, average AHI at goal at 1.4 per hour, leak on the low side with the 95th percentile at 2.2 L/m on a pressure of 23/19 cm. In the past 90 days his compliance percentage was similar at 36%.    I saw him on 05/10/2015, at which time he was not fully compliant with BiPAP therapy. He reported needing new supplies. He was still smoking. He had lost about 10 pounds in 12 months. I asked him to be fully compliant with BiPAP therapy.: He reports needing new supplies. He admits to not using his machine very much. Has no issues using it though. He sleeps better with it, he admits. He still smokes. He has lost a little bit of weight. Between February 2016 and February this year, he has lost about 10 pounds. We talked about his BiPAP titration study from 10/15/2014.   I reviewed his BiPAP compliance data from 12/18/2015 through 01/16/2016, which is a total of 30 days, during which time he used his BiPAP only 18 days with percent used days greater than 4 hours at 23%, indicating poor compliance with an average daily usage of only 2 hours and 15 minutes, residual AHI 1 per hour, leak low with  the 95th percentile at 7.7 L/m on a pressure of 23/19 cm. In the past 90 days his compliance percentage was 24%.   I saw him on 07/30/2014, at which time we talked about his auto BiPAP therapy. He was compliant with treatment but I suggested we bring him back for a full night titration study to optimize treatment setting and determine a set pressure. He had a CPAP titration study on 08/04/2014, this was followed by a BiPAP titration study on 10/15/2014. I went over his test results with him in detail today. His CPAP titration study from 08/04/2014 showed that his sleep apnea was not appropriately treated with CPAP. He was then switched to BiPAP therapy  but an optimal pressure was not determined during that study. CPAP was titrated from 5 cm to 16 cm but his AHI was 20 per hour while on CPAP. BiPAP was started at 18/14 and titrated to 22/18 but he had ongoing respiratory events, a brief trial of BiPAP ST was not successful during that study. Sleep efficiency was 79.4%. Sleep latency 7 minutes, wake after sleep onset was 89 minutes with mild to moderate sleep fragmentation noted. Average oxygen saturation was 92%, nadir was 83%. Based on unsuccessful treatment with CPAP therapy and suboptimal treatment with BiPAP therapy during the study I asked him to return for another full night BiPAP titration study. He had this on 10/15/2014. Sleep efficiency was 83.8%, latency to sleep 6 minutes, wake after sleep onset was 63 minutes with moderate sleep fragmentation noted. He had an elevated arousal index. He had increased percentages of stage I and stage II sleep, absence of slow-wave sleep and a REM percentage of 17.9% with a reduced REM latency of 24 minutes. He had mild PLMS with an index of 10.2 per hour, resulting in 3.9 arousals per hour. BiPAP was started at 8 over 4 cm and titrated to 23/19 cm. AHI was 0 per hour at that pressure. Supplemental oxygen was not utilized. On the final pressure, his desaturation nadir was  90%, supine REM sleep was achieved during the study but not on the final pressure. He indicated that he slept better than usual. Based on his test results are prescribed BiPAP therapy for home use with a setting of 29/19 cm via full facemask.   I reviewed his BiPAP compliance data from 04/05/2015 through 05/04/2015 which is a total of 30 days during which time he used his machine only 8 days with percent used days greater than 4 hours at 13% only, indicating poor compliance with an average usage of 1 hour and 17 minutes only. Residual AHI 3.1 per hour, leak low. Set pressure of 23/19 cm.   I first met him on 05/22/2014 at the request of his primary care physician, at which time the patient reported snoring and daytime somnolence. I suggested he return for sleep study. He had a split-night sleep study on 05/25/2014 and went over his test results with him in detail today. Baseline sleep efficiency was 77.3% with a latency to sleep of 0 minutes and wake after sleep onset of 35.5 minutes with severe sleep fragmentation noted. He had an elevated arousal index. He had absence of slow-wave sleep and absence of REM sleep prior to CPAP. He had had rare pauses on his EKG, about 3 seconds long. He had moderate to loud snoring. His total AHI was 119 per hour. This line oxygen saturation was 89%, nadir was 76%. He was then titrated on CPAP from 5-15 cm and then placed on BiPAP therapy and titrated from 16/12 cm to 24/20 cm. He did not have resolution of his sleep disordered breathing while on CPAP or BiPAP standard. I suggested Auto BiPAP at home and also suggested he undergo pulse oximetry testing while on BiPAP therapy at home.    I reviewed his BiPAP compliance data from 06/28/2014 through 07/27/2014 which is a total of 30 days during which time he used his machine only 22 days. Percent used days greater than 4 hours was 70%, indicating borderline/adequate compliance. Average usage for all nights was 4 hours and 54  minutes. He is on BiPAP with maximum IPAP of 25 cm, minimum EPAP of 14 cm and pressure support  of 4. Average AHI was 3.3 per hour, adequate. Leak was acceptable with the 95th percentile at 13.1 L/m. 95th percentile of pressure appears to be 21/17.   I spoke with his cardiologist on the phone on 05/19/14; the patient was having some 5-6 s pauses. His Holter monitor was placed earlier this month. On 04/30/2014 he had a syncopal spell. He was in the emergency room for this. He has seen Dr. Antoine Poche for cardiac workup. He has been advised to have a sleep study. He snores, he wakes up not refreshed and he has multiple nighttime awakenings and nocturia at least 4-5 times per night. His Epworth sleepiness score is 19 out of 24. He goes to bed around 9 PM and while he falls asleep fairly quickly he wakes up almost every hour he says. Sometimes he gets out of bed around 3 AM and never goes back to sleep. He denies restless leg symptoms but has some aching in his legs. He has lower extremity swelling. He wakes up with a headache at times. He is not aware of any family history of obstructive sleep apnea. He has never had a sleep study. He has been overweight for years. He snores and this is at times quite loud. [name] not sure if he actually quits breathing in his sleep but has woken up with a sense of gasping for air. He drinks about 2 Coca-Colas per day. He does not drink coffee. He occasionally drinks alcohol. He smokes one pack per day.   He lives with his brother and his sister-in-law. He is divorced. He does not have any children. There are no pets in his bed. Occasionally he watches TV in bed. He does not work.      His Past Medical History Is Significant For: Past Medical History:  Diagnosis Date   Arthritis    Asthma    Chronic bronchitis (HCC)    Cocaine abuse (HCC)    GERD (gastroesophageal reflux disease)    Headache    Hyperlipidemia    Hypertension    OSA treated with BiPAP    ahi-132, bipap  23/19   Syncope    Tobacco abuse     His Past Surgical History Is Significant For: Past Surgical History:  Procedure Laterality Date   ESOPHAGOGASTRODUODENOSCOPY (EGD) WITH PROPOFOL N/A 12/11/2012   Procedure: ESOPHAGOGASTRODUODENOSCOPY (EGD) WITH PROPOFOL;  Surgeon: Willis Modena, MD;  Location: [facility];  Service: Endoscopy;  Laterality: N/A;   FINGER AMPUTATION     KNEE ARTHROSCOPY Left 11/15/2015   Procedure: ARTHROSCOPY KNEE AND MEDIAL MENISECTOMY;  Surgeon: Durene Romans, MD;  Location: [facility];  Service: Orthopedics;  Laterality: Left;   KNEE ARTHROSCOPY WITH LATERAL MENISECTOMY Right 07/14/2020   Procedure: RIGHT KNEE PARTIAL LATERAL MENISCECTOMY;  Surgeon: Tarry Kos, MD;  Location: [facility];  Service: Orthopedics;  Laterality: Right;    His Family History Is Significant For: Family History  Problem Relation Age of Onset   Hypertension Father    Diabetes Father    Diabetes Sister    Stroke Sister    Diabetes Brother    Diabetes Other        Multiple family members   Sleep apnea Neg Hx     His Social History Is Significant For: Social History   Socioeconomic History   Marital status: Divorced    Spouse name: Not on file   Number of children: 0   Years of education: 12   Highest education level: 12th grade  Occupational History  Not on file  Tobacco Use   Smoking status: Every Day    Current packs/day: 0.50    Average packs/day: 0.5 packs/day for 40.0 years (20.0 ttl pk-yrs)    Types: Cigarettes    Passive exposure: Current   Smokeless tobacco: Never   Tobacco comments:    06/05/17 1 pack per 1 1/2 days    Smoking Cessation Classes, Agencies, Public librarian.  Vaping Use   Vaping status: Not on file  Substance and Sexual Activity   Alcohol use: Yes    Alcohol/week: 7.0 standard drinks of alcohol    Types: 7 Cans of beer per week   Drug use: Not Currently    Types: Cocaine   Sexual activity: Yes    Partners: Female  Other Topics Concern    Not on file  Social History Narrative   Lives with brother and his sister in Social [place].     Social Determinants of Health   Financial Resource Strain: Low Risk  (08/10/2022)   Overall Financial Resource Strain (CARDIA)    Difficulty of Paying Living Expenses: Not hard at all  Food Insecurity: No Food Insecurity (08/10/2022)   Hunger Vital Sign    Worried About Running Out of Food in the Last Year: Never true    Ran Out of Food in the Last Year: Never true  Transportation Needs: No Transportation Needs (08/10/2022)   PRAPARE - Administrator, Civil Service (Medical): No    Lack of Transportation (Non-Medical): No  Physical Activity: Inactive (08/10/2022)   Exercise Vital Sign    Days of Exercise per Week: 0 days    Minutes of Exercise per Session: 0 min  Stress: No Stress Concern Present (08/10/2022)   [employer] of Occupational Health - Occupational Stress Questionnaire    Feeling of Stress : Not at all  Social Connections: Moderately Integrated (08/10/2022)   Social Connection and Isolation Panel [NHANES]    Frequency of Communication with Friends and Family: More than three times a week    Frequency of Social Gatherings with Friends and Family: More than three times a week    Attends Religious Services: More than 4 times per year    Active Member of [employer] or Organizations: Yes    Attends Engineer, structural: More than 4 times per year    Marital Status: Divorced    His Allergies Are:  No Known Allergies:   His Current Medications Are:  Outpatient Encounter Medications as of 03/07/2023  Medication Sig   ACCU-CHEK GUIDE test strip USE AS DIRECTED TO MONITOR FSBS 2 TIMES A DAY   Accu-Chek Softclix Lancets lancets Use as instructed   albuterol (VENTOLIN HFA) 108 (90 Base) MCG/ACT inhaler INHALE 2 PUFFS INTO THE LUNGS EVERY 4 HOURS AS NEEDED FOR SHORTNESS OF BREATH   Allopurinol 200 MG TABS Take 200 mg by mouth daily. TAKE 1 TAB (200MG )  PO DAILY    amLODipine (NORVASC) 5 MG tablet TAKE 1 TABLET (5 MG TOTAL) BY MOUTH DAILY.   aspirin 81 MG chewable tablet Chew 81 mg by mouth in the morning.   atorvastatin (LIPITOR) 40 MG tablet TAKE 1 TABLET (40 MG TOTAL) BY MOUTH DAILY.   blood glucose meter kit and supplies KIT Dispense based on patient and insurance preference. Use up to four times daily as directed.   Blood Glucose Monitoring Suppl (ACCU-CHEK AVIVA PLUS) w/Device KIT Use as directed to monitor FSBS 2x daily. Dx: E11.9   celecoxib (CELEBREX) 200  MG capsule Take 1 capsule (200 mg total) by mouth 2 (two) times daily.   cyclobenzaprine (FLEXERIL) 10 MG tablet Take 0.5-1 tablets (5-10 mg total) by mouth 2 (two) times daily as needed for muscle spasms.   empagliflozin (JARDIANCE) 25 MG TABS tablet Take 1 tablet (25 mg total) by mouth daily before breakfast.   Erenumab-aooe (AIMOVIG) 140 MG/ML SOAJ Inject 140 mg into the skin every 30 (thirty) days.   fluticasone-salmeterol (ADVAIR HFA) 230-21 MCG/ACT inhaler Inhale 2 puffs into the lungs 2 (two) times daily.   gabapentin (NEURONTIN) 300 MG capsule TAKE 1 CAPSULE BY MOUTH 3 TIMES DAILY AS NEEDED (NERVE PAIN).   glucose blood test strip 1 each by Other route 2 (two) times daily. Use as instructed   HYDROcodone-acetaminophen (NORCO) 5-325 MG tablet Take 1-2 tablets by mouth 2 (two) times daily as needed.   linaclotide (LINZESS) 145 MCG CAPS capsule Take 1 capsule (145 mcg total) by mouth daily before breakfast.   meloxicam (MOBIC) 15 MG tablet Take 1 tablet (15 mg total) by mouth daily.   metFORMIN (GLUCOPHAGE) 1000 MG tablet TAKE 1 TABLET BY MOUTH 2 TIMES DAILY WITH A MEAL.   nitroGLYCERIN (NITROSTAT) 0.4 MG SL tablet Place 1 tablet (0.4 mg total) under the tongue every 5 (five) minutes as needed. For chest pain (Patient taking differently: Place 0.4 mg under the tongue every 5 (five) minutes x 3 doses as needed for chest pain.)   nystatin cream (MYCOSTATIN) Apply 1 Application topically 3 (three)  times daily.   omeprazole (PRILOSEC) 40 MG capsule TAKE 1 CAPSULE BY MOUTH DAILY.   pioglitazone (ACTOS) 30 MG tablet Take 1 tablet (30 mg total) by mouth daily.   Semaglutide, 1 MG/DOSE, 4 MG/3ML SOPN Inject 1 mg as directed once a week.   senna-docusate (SENOKOT-S) 8.6-50 MG tablet Take 1 tablet by mouth daily as needed (constipation).   topiramate (TOPAMAX) 25 MG tablet TAKE 2 TABLETS BY MOUTH 2 TIMES DAILY.   Facility-Administered Encounter Medications as of 03/07/2023  Medication   lactated ringers infusion  :  Review of Systems:  Out of a complete 14 point review of systems, all are reviewed and negative with the exception of these symptoms as listed below:  Review of Systems  Neurological:        Pt is here for follow up on OSA with CPAP. Pt states he is doing well with CPAP. States has no concerns. ESS 9    Objective:  Neurological Exam  Physical Exam Physical Examination:   Vitals:   03/07/23 1028  BP: 126/75  Pulse: 90    General Examination: The patient is a very pleasant 62 y.o. male in no acute distress. He appears well-developed and well-nourished and well groomed.   HEENT: Normocephalic, atraumatic, pupils are equal, round and reactive to light, tracking well-preserved, hearing grossly intact.  Face is symmetric with normal facial animation, speech is clear without dysarthria, hypophonia or voice tremor.  Neck with full range of motion, no carotid bruits. Oropharynx exam reveals: mild to moderate mouth dryness, adequate dental hygiene and marked airway crowding. Mallampati is class III. Tongue protrudes centrally and palate elevates symmetrically.  All stable findings.   Chest: Clear to auscultation without wheezing, rhonchi or crackles noted.   Heart: S1+S2+0, regular and normal without murmurs, rubs or gallops noted.    Abdomen: Soft, non-tender and non-distended.   Extremities: There is no pitting edema in the distal lower extremities bilaterally.     Skin: Dry appearing.  Musculoskeletal: exam reveals no obvious joint deformities.    Neurologically:  Mental status: The patient is awake, alert and oriented in all 4 spheres. His immediate and remote memory, attention, language skills and fund of knowledge are appropriate. There is no evidence of aphasia, agnosia, apraxia or anomia. Speech is clear with normal prosody and enunciation. Thought process is linear. Mood is normal and affect is normal.  Cranial nerves II - XII are as described above under HEENT exam.  Motor exam: Normal bulk, strength and tone is noted. There is no tremor. Fine motor skills and coordination: grossly intact. Cerebellar testing: No dysmetria or intention tremor. There is no truncal or gait ataxia.  Sensory exam: intact to light touch in the upper and lower extremities.  Gait, station and balance: He stands without difficulty, and walks slowly, no walking aid.              Assessment and Plan:   In summary, CANNEN OSHEL is a 62 year old right-handed gentleman with an underlying medical history of hyperlipidemia, coronary artery disease, reflux disease, morbid obesity with a BMI of over 45, headaches, arthritis, chronic bronchitis in the setting of smoking, history of syncopal event, who presents for follow-up consultation of his obstructive sleep apnea, on BiPAP therapy.  His compliance has fluctuated but generally he has been on treatment consistently.  He is reminded not to miss any nights if possible.  His sleep apnea is under good control when he is on treatment.  We did a repeat sleep study in January 2023. He is encouraged to continue with full compliance.  He is advised to continue to work on weight loss, he  has been able to lose some weight as far.  He is advised to follow-up routinely in this clinic to see one of our nurse practitioners in 1 year.  I answered all his questions today and he was in agreement.   I spent 20 minutes in total face-to-face time and in  reviewing records during pre-charting, more than 50% of which was spent in counseling and coordination of care, reviewing test results, reviewing medications and treatment regimen and/or in discussing or reviewing the diagnosis of OSA, the prognosis and treatment options. Pertinent laboratory and imaging test results that were available during this visit with the patient were reviewed by me and considered in my medical decision making (see chart for details).

## 2023-03-07 NOTE — Patient Instructions (Signed)

## 2023-03-13 ENCOUNTER — Telehealth: Payer: Self-pay

## 2023-03-13 ENCOUNTER — Other Ambulatory Visit: Payer: Self-pay | Admitting: Family Medicine

## 2023-03-13 ENCOUNTER — Other Ambulatory Visit: Payer: Self-pay

## 2023-03-13 DIAGNOSIS — E119 Type 2 diabetes mellitus without complications: Secondary | ICD-10-CM

## 2023-03-13 DIAGNOSIS — E1169 Type 2 diabetes mellitus with other specified complication: Secondary | ICD-10-CM

## 2023-03-13 MED ORDER — SEMAGLUTIDE (1 MG/DOSE) 4 MG/3ML ~~LOC~~ SOPN: 1.0000 mg | PEN_INJECTOR | SUBCUTANEOUS | 0 refills | Status: DC

## 2023-03-13 NOTE — Telephone Encounter (Signed)
Copied from CRM [id number]. Topic: Clinical - Medication Refill >> Mar 13, 2023  9:56 AM Cassiday T wrote: Most Recent Primary Care Visit:  Provider: [facility]  Department: [facility]  Visit Type: LAB  Date: 01/22/2023  Medication: ozempic 1.34 mg   Has the patient contacted their pharmacy? No (Agent: If no, request that the patient contact the pharmacy for the refill. If patient does not wish to contact the pharmacy document the reason why and proceed with request.) (Agent: If yes, when and what did the pharmacy advise?)  Is this the correct pharmacy for this prescription? Yes If no, delete pharmacy and type the correct one.  This is the patient's preferred pharmacy:  [facility] - [place], [place] - [address] [place] [address] Phone: [phone] Fax: [phone]   Has the prescription been filled recently? Yes  Is the patient out of the medication? Yes  Has the patient been seen for an appointment in the last year OR does the patient have an upcoming appointment? Yes  Can we respond through MyChart? No  Agent: Please be advised that Rx refills may take up to 3 business days. We ask that you follow-up with your pharmacy.   Pt would like a callback once the medication has been called in

## 2023-03-29 DIAGNOSIS — M25561 Pain in right knee: Secondary | ICD-10-CM | POA: Diagnosis not present

## 2023-03-29 DIAGNOSIS — M1711 Unilateral primary osteoarthritis, right knee: Secondary | ICD-10-CM | POA: Diagnosis not present

## 2023-03-29 DIAGNOSIS — M25562 Pain in left knee: Secondary | ICD-10-CM | POA: Diagnosis not present

## 2023-03-29 DIAGNOSIS — M1712 Unilateral primary osteoarthritis, left knee: Secondary | ICD-10-CM | POA: Diagnosis not present

## 2023-04-05 ENCOUNTER — Ambulatory Visit: Payer: 59 | Admitting: *Deleted

## 2023-04-05 DIAGNOSIS — Z0000 Encounter for general adult medical examination without abnormal findings: Secondary | ICD-10-CM | POA: Diagnosis not present

## 2023-04-05 NOTE — Patient Instructions (Signed)
 Kenneth [name] , Thank you for taking time to come for your Medicare Wellness Visit. I appreciate your ongoing commitment to your health goals. Please review the following plan we discussed and let me know if I can assist you in the future.   Screening recommendations/referrals: Colonoscopy: up to date Recommended yearly ophthalmology/optometry visit for glaucoma screening and checkup Recommended yearly dental visit for hygiene and checkup  Vaccinations: Influenza vaccine: Education provided Tdap vaccine: up to date Shingles vaccine: Education provided    Advanced directives: Education provided   Preventive Care 40-64 Years, Male Preventive care refers to lifestyle choices and visits with your health care provider that can promote health and wellness. What does preventive care include? A yearly physical exam. This is also called an annual well check. Dental exams once or twice a year. Routine eye exams. Ask your health care provider how often you should have your eyes checked. Personal lifestyle choices, including: Daily care of your teeth and gums. Regular physical activity. Eating a healthy diet. Avoiding tobacco and drug use. Limiting alcohol  use. Practicing safe sex. Taking low-dose aspirin  every day starting at age 24. What happens during an annual well check? The services and screenings done by your health care provider during your annual well check will depend on your age, overall health, lifestyle risk factors, and family history of disease. Counseling  Your health care provider may ask you questions about your: Alcohol  use. Tobacco use. Drug use. Emotional well-being. Home and relationship well-being. Sexual activity. Eating habits. Work and work astronomer. Screening  You may have the following tests or measurements: Height, weight, and BMI. Blood pressure. Lipid and cholesterol levels. These may be checked every 5 years, or more frequently if you are over 72 years  old. Skin check. Lung cancer screening. You may have this screening every year starting at age 8 if you have a 30-pack-year history of smoking and currently smoke or have quit within the past 15 years. Fecal occult blood test (FOBT) of the stool. You may have this test every year starting at age 61. Flexible sigmoidoscopy or colonoscopy. You may have a sigmoidoscopy every 5 years or a colonoscopy every 10 years starting at age 92. Prostate cancer screening. Recommendations will vary depending on your family history and other risks. Hepatitis C blood test. Hepatitis B blood test. Sexually transmitted disease (STD) testing. Diabetes screening. This is done by checking your blood sugar (glucose) after you have not eaten for a while (fasting). You may have this done every 1-3 years. Discuss your test results, treatment options, and if necessary, the need for more tests with your health care provider. Vaccines  Your health care provider may recommend certain vaccines, such as: Influenza vaccine. This is recommended every year. Tetanus, diphtheria, and acellular pertussis (Tdap, Td) vaccine. You may need a Td booster every 10 years. Zoster vaccine. You may need this after age 44. Pneumococcal 13-valent conjugate (PCV13) vaccine. You may need this if you have certain conditions and have not been vaccinated. Pneumococcal polysaccharide (PPSV23) vaccine. You may need one or two doses if you smoke cigarettes or if you have certain conditions. Talk to your health care provider about which screenings and vaccines you need and how often you need them. This information is not intended to replace advice given to you by your health care provider. Make sure you discuss any questions you have with your health care provider. Document Released: 04/09/2015 Document Revised: 12/01/2015 Document Reviewed: 01/12/2015 Elsevier Interactive Patient Education  2017 [employer]  Fall Prevention in the Home Falls can  cause injuries. They can happen to people of all ages. There are many things you can do to make your home safe and to help prevent falls. What can I do on the outside of my home? Regularly fix the edges of walkways and driveways and fix any cracks. Remove anything that might make you trip as you walk through a door, such as a raised step or threshold. Trim any bushes or trees on the path to your home. Use bright outdoor lighting. Clear any walking paths of anything that might make someone trip, such as rocks or tools. Regularly check to see if handrails are loose or broken. Make sure that both sides of any steps have handrails. Any raised decks and porches should have guardrails on the edges. Have any leaves, snow, or ice cleared regularly. Use sand or salt on walking paths during winter. Clean up any spills in your garage right away. This includes oil or grease spills. What can I do in the bathroom? Use night lights. Install grab bars by the toilet and in the tub and shower. Do not use towel bars as grab bars. Use non-skid mats or decals in the tub or shower. If you need to sit down in the shower, use a plastic, non-slip stool. Keep the floor dry. Clean up any water that spills on the floor as soon as it happens. Remove soap buildup in the tub or shower regularly. Attach bath mats securely with double-sided non-slip rug tape. Do not have throw rugs and other things on the floor that can make you trip. What can I do in the bedroom? Use night lights. Make sure that you have a light by your bed that is easy to reach. Do not use any sheets or blankets that are too big for your bed. They should not hang down onto the floor. Have a firm chair that has side arms. You can use this for support while you get dressed. Do not have throw rugs and other things on the floor that can make you trip. What can I do in the kitchen? Clean up any spills right away. Avoid walking on wet floors. Keep items  that you use a lot in easy-to-reach places. If you need to reach something above you, use a strong step stool that has a grab bar. Keep electrical cords out of the way. Do not use floor polish or wax that makes floors slippery. If you must use wax, use non-skid floor wax. Do not have throw rugs and other things on the floor that can make you trip. What can I do with my stairs? Do not leave any items on the stairs. Make sure that there are handrails on both sides of the stairs and use them. Fix handrails that are broken or loose. Make sure that handrails are as long as the stairways. Check any carpeting to make sure that it is firmly attached to the stairs. Fix any carpet that is loose or worn. Avoid having throw rugs at the top or bottom of the stairs. If you do have throw rugs, attach them to the floor with carpet tape. Make sure that you have a light switch at the top of the stairs and the bottom of the stairs. If you do not have them, ask someone to add them for you. What else can I do to help prevent falls? Wear shoes that: Do not have high heels. Have rubber bottoms. Are comfortable  and fit you well. Are closed at the toe. Do not wear sandals. If you use a stepladder: Make sure that it is fully opened. Do not climb a closed stepladder. Make sure that both sides of the stepladder are locked into place. Ask someone to hold it for you, if possible. Clearly mark and make sure that you can see: Any grab bars or handrails. First and last steps. Where the edge of each step is. Use tools that help you move around (mobility aids) if they are needed. These include: Canes. Walkers. Scooters. Crutches. Turn on the lights when you go into a dark area. Replace any light bulbs as soon as they burn out. Set up your furniture so you have a clear path. Avoid moving your furniture around. If any of your floors are uneven, fix them. If there are any pets around you, be aware of where they  are. Review your medicines with your doctor. Some medicines can make you feel dizzy. This can increase your chance of falling. Ask your doctor what other things that you can do to help prevent falls. This information is not intended to replace advice given to you by your health care provider. Make sure you discuss any questions you have with your health care provider. Document Released: 01/07/2009 Document Revised: 08/19/2015 Document Reviewed: 04/17/2014 [employer] Interactive Patient Education  2017 [employer].

## 2023-04-05 NOTE — Progress Notes (Signed)
 Subjective:   Kenneth [name] is a 63 y.o. male who presents for Medicare Annual/Subsequent preventive examination.  Visit Complete: Virtual I connected with  Kenneth [name] on 04/05/23 by a video and audio enabled telemedicine application and verified that I am speaking with the correct person using two identifiers.  Patient Location: Home  Provider Location: Home Office  I discussed the limitations of evaluation and management by telemedicine. The patient expressed understanding and agreed to proceed.  Vital Signs: Because this visit was a virtual/telehealth visit, some criteria may be missing or patient reported. Any vitals not documented were not able to be obtained and vitals that have been documented are patient reported.   Cardiac Risk Factors include: advanced age (>62men, >69 women);diabetes mellitus;male gender;hypertension     Objective:    There were no vitals filed for this visit. There is no height or weight on file to calculate BMI.     08/10/2022    4:13 PM 03/30/2022    3:14 PM 06/15/2021    3:54 PM 03/24/2021    2:50 PM 07/30/2020    6:00 AM 07/14/2020   11:59 AM 05/08/2020    8:28 AM  Advanced Directives  Does Patient Have a Medical Advance Directive? No No No No No No No  Would patient like information on creating a medical advance directive? No - Patient declined No - Guardian declined No - Patient declined No - Patient declined No - Patient declined No - Patient declined No - Patient declined    Current Medications (verified) Outpatient Encounter Medications as of 04/05/2023  Medication Sig   ACCU-CHEK GUIDE test strip USE AS DIRECTED TO MONITOR FSBS 2 TIMES A DAY   Accu-Chek Softclix Lancets lancets Use as instructed   albuterol  (VENTOLIN  HFA) 108 (90 Base) MCG/ACT inhaler INHALE 2 PUFFS INTO THE LUNGS EVERY 4 HOURS AS NEEDED FOR SHORTNESS OF BREATH   Allopurinol  200 MG TABS Take 200 mg by mouth daily. TAKE 1 TAB (200MG )  PO DAILY   amLODipine  (NORVASC ) 5 MG  tablet TAKE 1 TABLET (5 MG TOTAL) BY MOUTH DAILY.   aspirin  81 MG chewable tablet Chew 81 mg by mouth in the morning.   atorvastatin  (LIPITOR) 40 MG tablet TAKE 1 TABLET (40 MG TOTAL) BY MOUTH DAILY.   blood glucose meter kit and supplies KIT Dispense based on patient and insurance preference. Use up to four times daily as directed.   Blood Glucose Monitoring Suppl (ACCU-CHEK AVIVA PLUS) w/Device KIT Use as directed to monitor FSBS 2x daily. Dx: E11.9   celecoxib  (CELEBREX ) 200 MG capsule Take 1 capsule (200 mg total) by mouth 2 (two) times daily.   cyclobenzaprine  (FLEXERIL ) 10 MG tablet Take 0.5-1 tablets (5-10 mg total) by mouth 2 (two) times daily as needed for muscle spasms.   empagliflozin  (JARDIANCE ) 25 MG TABS tablet Take 1 tablet (25 mg total) by mouth daily before breakfast.   Erenumab -aooe (AIMOVIG ) 140 MG/ML SOAJ Inject 140 mg into the skin every 30 (thirty) days.   fluticasone -salmeterol (ADVAIR  HFA) 230-21 MCG/ACT inhaler Inhale 2 puffs into the lungs 2 (two) times daily.   gabapentin  (NEURONTIN ) 300 MG capsule TAKE 1 CAPSULE BY MOUTH 3 TIMES DAILY AS NEEDED (NERVE PAIN).   glucose blood test strip 1 each by Other route 2 (two) times daily. Use as instructed   linaclotide  (LINZESS ) 145 MCG CAPS capsule Take 1 capsule (145 mcg total) by mouth daily before breakfast.   meloxicam  (MOBIC ) 15 MG tablet Take 1 tablet (  15 mg total) by mouth daily.   metFORMIN  (GLUCOPHAGE ) 1000 MG tablet TAKE 1 TABLET BY MOUTH 2 TIMES DAILY WITH A MEAL.   nitroGLYCERIN  (NITROSTAT ) 0.4 MG SL tablet Place 1 tablet (0.4 mg total) under the tongue every 5 (five) minutes as needed. For chest pain (Patient taking differently: Place 0.4 mg under the tongue every 5 (five) minutes x 3 doses as needed for chest pain.)   nystatin  cream (MYCOSTATIN ) Apply 1 Application topically 3 (three) times daily.   omeprazole  (PRILOSEC) 40 MG capsule TAKE 1 CAPSULE BY MOUTH DAILY.   pioglitazone  (ACTOS ) 30 MG tablet Take 1 tablet  (30 mg total) by mouth daily.   senna-docusate (SENOKOT-S) 8.6-50 MG tablet Take 1 tablet by mouth daily as needed (constipation).   topiramate  (TOPAMAX ) 25 MG tablet TAKE 2 TABLETS BY MOUTH 2 TIMES DAILY.   HYDROcodone -acetaminophen  (NORCO) 5-325 MG tablet Take 1-2 tablets by mouth 2 (two) times daily as needed. (Patient not taking: Reported on 04/05/2023)   Semaglutide , 1 MG/DOSE, 4 MG/3ML SOPN Inject 1 mg as directed once a week. Pt will need an appointment before further refills. (Patient not taking: Reported on 04/05/2023)   Facility-Administered Encounter Medications as of 04/05/2023  Medication   lactated ringers  infusion    Allergies (verified) Patient has no known allergies.   History: Past Medical History:  Diagnosis Date   Arthritis    Asthma    Chronic bronchitis (HCC)    Cocaine abuse (HCC)    GERD (gastroesophageal reflux disease)    Headache    Hyperlipidemia    Hypertension    OSA treated with BiPAP    ahi-132, bipap 23/19   Syncope    Tobacco abuse    Past Surgical History:  Procedure Laterality Date   ESOPHAGOGASTRODUODENOSCOPY (EGD) WITH PROPOFOL  N/A 12/11/2012   Procedure: ESOPHAGOGASTRODUODENOSCOPY (EGD) WITH PROPOFOL ;  Surgeon: Elsie Cree, MD;  Location: [facility];  Service: Endoscopy;  Laterality: N/A;   FINGER AMPUTATION     KNEE ARTHROSCOPY Left 11/15/2015   Procedure: ARTHROSCOPY KNEE AND MEDIAL MENISECTOMY;  Surgeon: Donnice Car, MD;  Location: [facility];  Service: Orthopedics;  Laterality: Left;   KNEE ARTHROSCOPY WITH LATERAL MENISECTOMY Right 07/14/2020   Procedure: RIGHT KNEE PARTIAL LATERAL MENISCECTOMY;  Surgeon: Jerri Kay HERO, MD;  Location: [facility];  Service: Orthopedics;  Laterality: Right;   Family History  Problem Relation Age of Onset   Hypertension Father    Diabetes Father    Diabetes Sister    Stroke Sister    Diabetes Brother    Diabetes Other        Multiple family members   Sleep apnea Neg Hx    Social History   Socioeconomic  History   Marital status: Divorced    Spouse name: Not on file   Number of children: 0   Years of education: 12   Highest education level: 12th grade  Occupational History   Not on file  Tobacco Use   Smoking status: Every Day    Current packs/day: 0.50    Average packs/day: 0.5 packs/day for 40.0 years (20.0 ttl pk-yrs)    Types: Cigarettes    Passive exposure: Current   Smokeless tobacco: Never   Tobacco comments:    06/05/17 1 pack per 1 1/2 days    Smoking Cessation Classes, Agencies, Public Librarian.  Vaping Use   Vaping status: Not on file  Substance and Sexual Activity   Alcohol  use: Yes    Alcohol /week: 7.0 standard  drinks of alcohol     Types: 7 Cans of beer per week   Drug use: Not Currently    Types: Cocaine   Sexual activity: Yes    Partners: Female  Other Topics Concern   Not on file  Social History Narrative   Lives with brother and his sister in social worker.     Social Drivers of Corporate Investment Banker Strain: Low Risk  (04/05/2023)   Overall Financial Resource Strain (CARDIA)    Difficulty of Paying Living Expenses: Not hard at all  Food Insecurity: No Food Insecurity (04/05/2023)   Hunger Vital Sign    Worried About Running Out of Food in the Last Year: Never true    Ran Out of Food in the Last Year: Never true  Transportation Needs: No Transportation Needs (04/05/2023)   PRAPARE - Administrator, Civil Service (Medical): No    Lack of Transportation (Non-Medical): No  Physical Activity: Insufficiently Active (04/05/2023)   Exercise Vital Sign    Days of Exercise per Week: 3 days    Minutes of Exercise per Session: 30 min  Stress: No Stress Concern Present (04/05/2023)   [employer] of Occupational Health - Occupational Stress Questionnaire    Feeling of Stress : Not at all  Social Connections: Socially Isolated (04/05/2023)   Social Connection and Isolation Panel [NHANES]    Frequency of Communication with Friends and Family: Three  times a week    Frequency of Social Gatherings with Friends and Family: Twice a week    Attends Religious Services: Never    Database Administrator or Organizations: No    Attends Banker Meetings: Never    Marital Status: Divorced    Tobacco Counseling Ready to quit: Not Answered Counseling given: Not Answered Tobacco comments: 06/05/17 1 pack per 1 1/2 days Smoking Cessation Classes, Agencies, Public Librarian.   Clinical Intake:  Pre-visit preparation completed: Yes  Pain : 0-10 Pain Location: Knee Pain Orientation: Right, Left Pain Descriptors / Indicators: Burning, Aching, Constant, Dull, Grimacing Pain Onset: More than a month ago Pain Frequency: Constant     Diabetes: Yes CBG done?: No Did pt. bring in CBG monitor from home?: No  How often do you need to have someone help you when you read instructions, pamphlets, or other written materials from your doctor or pharmacy?: 1 - Never  Interpreter Needed?: No  Information entered by :: Mliss Graff LPN   Activities of Daily Living    04/05/2023    2:45 PM 08/10/2022    4:13 PM  In your present state of health, do you have any difficulty performing the following activities:  Hearing? 0 0  Vision? 0 0  Difficulty concentrating or making decisions? 0 0  Walking or climbing stairs? 0 0  Dressing or bathing? 0 0  Doing errands, shopping? 0 0  Preparing Food and eating ? N N  Using the Toilet? N N  In the past six months, have you accidently leaked urine? N N  Do you have problems with loss of bowel control? N N  Managing your Medications? N N  Managing your Finances? N N  Housekeeping or managing your Housekeeping? N N    Patient Care Team: Duanne Butler DASEN, MD as PCP - General (Family Medicine) Pietro Redell RAMAN, MD as PCP - Cardiology (Cardiology) Nicholaus Sherlean CROME, [facility] (Inactive) as Pharmacist (Pharmacist) Fleeta Zerita DASEN, MD as Consulting Physician (Ophthalmology) Buck Saucer,  MD as  Consulting Physician (Neurology) Zan Factor, DPM as Consulting Physician (Podiatry)  Indicate any recent Medical Services you may have received from other than [facility] providers in the past year (date may be approximate).     Assessment:   This is a routine wellness examination for Kenneth [name].  Hearing/Vision screen Hearing Screening - Comments:: No trouble hearing Vision Screening - Comments:: Up to date Unsure of name   Goals Addressed             This Visit's Progress    Increase physical activity       Be free of Knee pain       Depression Screen    04/05/2023    2:49 PM 10/05/2022    2:32 PM 08/10/2022    4:08 PM 04/11/2022    3:49 PM 03/30/2022    3:10 PM 02/07/2022    3:49 PM 11/22/2021   12:06 PM  PHQ 2/9 Scores  PHQ - 2 Score 3 0 0 0 0 0 0  PHQ- 9 Score 4          Fall Risk    04/05/2023    2:40 PM 10/05/2022    2:32 PM 08/10/2022    4:13 PM 04/11/2022    3:49 PM 03/30/2022    3:09 PM  Fall Risk   Falls in the past year? 0 0 0 0 0  Number falls in past yr: 0 0 0 0 0  Injury with Fall? 0 0 0 0 0  Risk for fall due to :  No Fall Risks No Fall Risks No Fall Risks No Fall Risks  Follow up Falls evaluation completed;Education provided;Falls prevention discussed Falls prevention discussed Falls evaluation completed;Education provided;Falls prevention discussed Falls prevention discussed Falls evaluation completed;Education provided;Falls prevention discussed    MEDICARE RISK AT HOME: Medicare Risk at Home Any stairs in or around the home?: Yes If so, are there any without handrails?: No Home free of loose throw rugs in walkways, pet beds, electrical cords, etc?: Yes Adequate lighting in your home to reduce risk of falls?: Yes Life alert?: No Use of a cane, walker or w/c?: No Grab bars in the bathroom?: No Shower chair or bench in shower?: No Elevated toilet seat or a handicapped toilet?: Yes  TIMED UP AND GO:  Was the test performed?  No    Cognitive  Function:        04/05/2023    2:46 PM 03/30/2022    3:14 PM 03/24/2021    2:52 PM  6CIT Screen  What Year? 0 points 0 points 0 points  What month? 0 points 0 points 0 points  What time? 0 points 0 points 0 points  Count back from 20 0 points 0 points 0 points  Months in reverse 4 points 2 points 4 points  Repeat phrase 2 points 0 points 0 points  Total Score 6 points 2 points 4 points    Immunizations Immunization History  Administered Date(s) Administered   Influenza,inj,Quad PF,6+ Mos 12/25/2018, 01/26/2020, 12/21/2020, 04/28/2022   Moderna Sars-Covid-2 Vaccination 05/25/2019, 06/28/2019   Pneumococcal Polysaccharide-23 08/25/2016   Tdap 08/25/2016, 06/15/2021    TDAP status: Up to date  Flu Vaccine status: Due, Education has been provided regarding the importance of this vaccine. Advised may receive this vaccine at local pharmacy or Health Dept. Aware to provide a copy of the vaccination record if obtained from local pharmacy or Health Dept. Verbalized acceptance and understanding.    Covid-19 vaccine status: Information provided on how  to obtain vaccines.   Qualifies for Shingles Vaccine? Yes   Zostavax completed No   Shingrix Completed?: No.    Education has been provided regarding the importance of this vaccine. Patient has been advised to call insurance company to determine out of pocket expense if they have not yet received this vaccine. Advised may also receive vaccine at local pharmacy or Health Dept. Verbalized acceptance and understanding.  Screening Tests Health Maintenance  Topic Date Due   Hepatitis C Screening  05/24/2023 (Originally 04/30/1978)   INFLUENZA VACCINE  06/25/2023 (Originally 10/26/2022)   Lung Cancer Screening  02/08/2024 (Originally 06/16/2022)   COVID-19 Vaccine (3 - 2024-25 season) 04/02/2024 (Originally 11/26/2022)   Zoster Vaccines- Shingrix (1 of 2) 04/03/2024 (Originally 04/30/2010)   OPHTHALMOLOGY EXAM  04/26/2023   HEMOGLOBIN A1C  07/23/2023    FOOT EXAM  10/05/2023   Diabetic kidney evaluation - Urine ACR  10/12/2023   Diabetic kidney evaluation - eGFR measurement  01/22/2024   Medicare Annual Wellness (AWV)  04/04/2024   Colonoscopy  03/27/2026   DTaP/Tdap/Td (3 - Td or Tdap) 06/16/2031   HIV Screening  Completed   HPV VACCINES  Aged Out    Health Maintenance  There are no preventive care reminders to display for this patient.   Colorectal cancer screening: Type of screening: Colonoscopy. Completed 2018. Repeat every 10 years  Lung Cancer Screening: (Low Dose CT Chest recommended if Age 1-80 years, 20 pack-year currently smoking OR have quit w/in 15years.) does qualify.   Lung Cancer Screening Referral: ordered  Additional Screening:  Hepatitis C Screening:  never done  Vision Screening: Recommended annual ophthalmology exams for early detection of glaucoma and other disorders of the eye. Is the patient up to date with their annual eye exam?  Yes  Who is the provider or what is the name of the office in which the patient attends annual eye exams? Unsure of name If pt is not established with a provider, would they like to be referred to a provider to establish care? No .   Dental Screening: Recommended annual dental exams for proper oral hygiene  Nutrition Risk Assessment:  Has the patient had any N/V/D within the last 2 months?  No  Does the patient have any non-healing wounds?  No  Has the patient had any unintentional weight loss or weight gain?  No   Diabetes:  Is the patient diabetic?  Yes  If diabetic, was a CBG obtained today?  No  Did the patient bring in their glucometer from home?  Yes  How often do you monitor your CBG's? 2 x a week.   Financial Strains and Diabetes Management:  Are you having any financial strains with the device, your supplies or your medication? No .  Does the patient want to be seen by Chronic Care Management for management of their diabetes?  No  Would the patient like  to be referred to a Nutritionist or for Diabetic Management?  No   Diabetic Exams:  Diabetic Eye Exam: Completed . Pt has been advised about the importance in completing this exam  Diabetic Foot Exam: . Pt has been advised about the importance in completing this exam.    Community Resource Referral / Chronic Care Management: CRR required this visit?  No   CCM required this visit?  No     Plan:     I have personally reviewed and noted the following in the patient's chart:   Medical and social history Use of  alcohol , tobacco or illicit drugs  Current medications and supplements including opioid prescriptions. Patient is currently taking opioid prescriptions. Information provided to patient regarding non-opioid alternatives. Patient advised to discuss non-opioid treatment plan with their provider. Functional ability and status Nutritional status Physical activity Advanced directives List of other physicians Hospitalizations, surgeries, and ER visits in previous 12 months Vitals Screenings to include cognitive, depression, and falls Referrals and appointments  In addition, I have reviewed and discussed with patient certain preventive protocols, quality metrics, and best practice recommendations. A written personalized care plan for preventive services as well as general preventive health recommendations were provided to patient.     Mliss Graff, LPN   [id number]   After Visit Summary: (MyChart) Due to this being a telephonic visit, the after visit summary with patients personalized plan was offered to patient via MyChart   Nurse Notes:

## 2023-04-17 ENCOUNTER — Other Ambulatory Visit: Payer: Self-pay | Admitting: Family Medicine

## 2023-04-17 ENCOUNTER — Ambulatory Visit: Payer: Self-pay | Admitting: Family Medicine

## 2023-04-17 NOTE — Telephone Encounter (Signed)
Copied from CRM [id number]. Topic: Clinical - Red Word Triage >> Apr 17, 2023  4:41 PM Elle L wrote: Red Word that prompted transfer to Nurse Triage: The patient states that he is in severe pain in his legs and knees and has been unable to sleep at night due to it.   Chief Complaint: Leg pain Symptoms: Bilateral leg pain, right leg burning sensation  Frequency: Constant  Pertinent Negatives: Patient denies any other symptoms  Disposition: [] ED /[] Urgent Care (no appt availability in office) / [x] Appointment(In office/virtual)/ []  [place] [facility]/ [] Home Care/ [x] Refused Recommended Disposition /[] [facility] [place] [facility]/ []  Follow-up with PCP Additional Notes: Patient reports history of bilateral leg pain that's been going on for about a year. Patient states pain is worsening and causing him difficulty sleeping. Patient wanted to know if something could be prescribed for his pain until his upcoming appointment with someone on 05/07/23. Advised patient that Dr. Tanya Nones has an appointment available Thursday but I could get him in with another provider tomorrow. Patient declined either of these options and ended the call.     Reason for Disposition  Leg pain or muscle cramp is a chronic symptom (recurrent or ongoing AND present > 4 weeks)  Answer Assessment - Initial Assessment Questions 1. ONSET: "When did the pain start?"      A year  2. LOCATION: "Where is the pain located?"      Both legs, right leg is burning  3. PAIN: "How bad is the pain?"    (Scale 1-10; or mild, moderate, severe)   -  MILD (1-3): doesn't interfere with normal activities    -  MODERATE (4-7): interferes with normal activities (e.g., work or school) or awakens from sleep, limping    -  SEVERE (8-10): excruciating pain, unable to do any normal activities, unable to walk     9/10 4. WORK OR EXERCISE: "Has there been any recent work or exercise that involved this part of the body?"      No 5. CAUSE: "What do you  think is causing the leg pain?"     Unsure  6. OTHER SYMPTOMS: "Do you have any other symptoms?" (e.g., chest pain, back pain, breathing difficulty, swelling, rash, fever, numbness, weakness)     No  Protocols used: Leg Pain-A-AH

## 2023-04-18 ENCOUNTER — Other Ambulatory Visit: Payer: Self-pay | Admitting: Family Medicine

## 2023-04-18 DIAGNOSIS — G4452 New daily persistent headache (NDPH): Secondary | ICD-10-CM

## 2023-04-18 DIAGNOSIS — E1169 Type 2 diabetes mellitus with other specified complication: Secondary | ICD-10-CM

## 2023-04-18 DIAGNOSIS — E119 Type 2 diabetes mellitus without complications: Secondary | ICD-10-CM

## 2023-04-23 ENCOUNTER — Ambulatory Visit
Admission: RE | Admit: 2023-04-23 | Discharge: 2023-04-23 | Disposition: A | Discharge status: Home / Self Care | Payer: 59 | Source: Ambulatory Visit | Attending: Family Medicine | Admitting: Family Medicine

## 2023-04-23 ENCOUNTER — Ambulatory Visit (INDEPENDENT_AMBULATORY_CARE_PROVIDER_SITE_OTHER): Payer: 59 | Admitting: Family Medicine

## 2023-04-23 VITALS — BP 118/64 | HR 96 | Temp 98.7°F | Ht 66.0 in | Wt 285.0 lb

## 2023-04-23 DIAGNOSIS — M5441 Lumbago with sciatica, right side: Secondary | ICD-10-CM | POA: Diagnosis not present

## 2023-04-23 DIAGNOSIS — E119 Type 2 diabetes mellitus without complications: Secondary | ICD-10-CM

## 2023-04-23 DIAGNOSIS — G4452 New daily persistent headache (NDPH): Secondary | ICD-10-CM

## 2023-04-23 DIAGNOSIS — E1169 Type 2 diabetes mellitus with other specified complication: Secondary | ICD-10-CM

## 2023-04-23 DIAGNOSIS — E782 Mixed hyperlipidemia: Secondary | ICD-10-CM | POA: Diagnosis not present

## 2023-04-23 DIAGNOSIS — Z7984 Long term (current) use of oral hypoglycemic drugs: Secondary | ICD-10-CM

## 2023-04-23 DIAGNOSIS — M5431 Sciatica, right side: Secondary | ICD-10-CM

## 2023-04-23 MED ORDER — PREDNISONE 20 MG PO TABS: ORAL_TABLET | ORAL | 0 refills | Status: DC

## 2023-04-23 MED ORDER — GABAPENTIN 300 MG PO CAPS: 600.0000 mg | ORAL_CAPSULE | Freq: Three times a day (TID) | ORAL | 5 refills | Status: AC

## 2023-04-23 MED ORDER — AIMOVIG 140 MG/ML ~~LOC~~ SOAJ: 140.0000 mg | SUBCUTANEOUS | 11 refills | Status: DC

## 2023-04-23 MED ORDER — SEMAGLUTIDE (2 MG/DOSE) 8 MG/3ML ~~LOC~~ SOPN: 2.0000 mg | PEN_INJECTOR | SUBCUTANEOUS | 5 refills | Status: DC

## 2023-04-23 NOTE — Progress Notes (Signed)
Subjective:    Patient ID: Kenneth [name], male    DOB: [birth year], 63 y.o.   MRN: [id number]  Patient reports right leg pain for several months.  The pain begins in his right gluteus and radiates down his right leg into his right calf almost to his heel.  The pain is sharp and intense.  He denies any falls or injuries.  He is currently on gabapentin 300 mg 3 times a day without any relief.  He does complain of low back pain and decreased range of motion. Past Medical History:  Diagnosis Date   Arthritis    Asthma    Chronic bronchitis (HCC)    Cocaine abuse (HCC)    GERD (gastroesophageal reflux disease)    Headache    Hyperlipidemia    Hypertension    OSA treated with BiPAP    ahi-132, bipap 23/19   Syncope    Tobacco abuse    Past Surgical History:  Procedure Laterality Date   ESOPHAGOGASTRODUODENOSCOPY (EGD) WITH PROPOFOL N/A 12/11/2012   Procedure: ESOPHAGOGASTRODUODENOSCOPY (EGD) WITH PROPOFOL;  Surgeon: Willis Modena, MD;  Location: [facility];  Service: Endoscopy;  Laterality: N/A;   FINGER AMPUTATION     KNEE ARTHROSCOPY Left 11/15/2015   Procedure: ARTHROSCOPY KNEE AND MEDIAL MENISECTOMY;  Surgeon: Durene Romans, MD;  Location: [facility];  Service: Orthopedics;  Laterality: Left;   KNEE ARTHROSCOPY WITH LATERAL MENISECTOMY Right 07/14/2020   Procedure: RIGHT KNEE PARTIAL LATERAL MENISCECTOMY;  Surgeon: Tarry Kos, MD;  Location: [facility];  Service: Orthopedics;  Laterality: Right;   Current Outpatient Medications on File Prior to Visit  Medication Sig Dispense Refill   ACCU-CHEK GUIDE test strip USE AS DIRECTED TO MONITOR FSBS 2 TIMES A DAY 100 strip 1   Accu-Chek Softclix Lancets lancets Use as instructed 100 each 12   albuterol (VENTOLIN HFA) 108 (90 Base) MCG/ACT inhaler INHALE 2 PUFFS INTO THE LUNGS EVERY 4 HOURS AS NEEDED FOR SHORTNESS OF BREATH 8.5 g 2   Allopurinol 200 MG TABS Take 200 mg by mouth daily. TAKE 1 TAB (200MG )  PO DAILY 90 tablet 3   amLODipine (NORVASC)  5 MG tablet TAKE 1 TABLET (5 MG TOTAL) BY MOUTH DAILY. 90 tablet 0   aspirin 81 MG chewable tablet Chew 81 mg by mouth in the morning.     atorvastatin (LIPITOR) 40 MG tablet TAKE 1 TABLET (40 MG TOTAL) BY MOUTH DAILY. 90 tablet 3   blood glucose meter kit and supplies KIT Dispense based on patient and insurance preference. Use up to four times daily as directed. 1 each 11   Blood Glucose Monitoring Suppl (ACCU-CHEK AVIVA PLUS) w/Device KIT Use as directed to monitor FSBS 2x daily. Dx: E11.9 1 kit 1   celecoxib (CELEBREX) 200 MG capsule Take 1 capsule (200 mg total) by mouth 2 (two) times daily. 20 capsule 0   cyclobenzaprine (FLEXERIL) 10 MG tablet Take 0.5-1 tablets (5-10 mg total) by mouth 2 (two) times daily as needed for muscle spasms. 20 tablet 0   empagliflozin (JARDIANCE) 25 MG TABS tablet Take 1 tablet (25 mg total) by mouth daily before breakfast. 30 tablet 3   fluticasone-salmeterol (ADVAIR HFA) 230-21 MCG/ACT inhaler Inhale 2 puffs into the lungs 2 (two) times daily. 8 g 3   glucose blood test strip 1 each by Other route 2 (two) times daily. Use as instructed 100 each 12   HYDROcodone-acetaminophen (NORCO) 5-325 MG tablet Take 1-2 tablets by mouth 2 (two) times daily  as needed. 20 tablet 0   linaclotide (LINZESS) 145 MCG CAPS capsule Take 1 capsule (145 mcg total) by mouth daily before breakfast. 30 capsule 5   meloxicam (MOBIC) 15 MG tablet Take 1 tablet (15 mg total) by mouth daily. 30 tablet 0   metFORMIN (GLUCOPHAGE) 1000 MG tablet TAKE 1 TABLET BY MOUTH 2 TIMES DAILY WITH A MEAL. 180 tablet 0   nitroGLYCERIN (NITROSTAT) 0.4 MG SL tablet Place 1 tablet (0.4 mg total) under the tongue every 5 (five) minutes as needed. For chest pain (Patient taking differently: Place 0.4 mg under the tongue every 5 (five) minutes x 3 doses as needed for chest pain.) 20 tablet 1   nystatin cream (MYCOSTATIN) Apply 1 Application topically 3 (three) times daily. 30 g 1   omeprazole (PRILOSEC) 40 MG  capsule TAKE 1 CAPSULE BY MOUTH DAILY. 90 capsule 3   pioglitazone (ACTOS) 30 MG tablet Take 1 tablet (30 mg total) by mouth daily. 90 tablet 3   senna-docusate (SENOKOT-S) 8.6-50 MG tablet Take 1 tablet by mouth daily as needed (constipation).     topiramate (TOPAMAX) 25 MG tablet TAKE 2 TABLETS BY MOUTH 2 TIMES DAILY. 360 tablet 0   Current Facility-Administered Medications on File Prior to Visit  Medication Dose Route Frequency Provider Last Rate Last Admin   lactated ringers infusion    Continuous PRN Donn Pierini, CRNA   New Bag at 11/15/15 1545   No Known Allergies Social History   Socioeconomic History   Marital status: Divorced    Spouse name: Not on file   Number of children: 0   Years of education: 12   Highest education level: 10th grade  Occupational History   Not on file  Tobacco Use   Smoking status: Every Day    Current packs/day: 0.50    Average packs/day: 0.5 packs/day for 40.0 years (20.0 ttl pk-yrs)    Types: Cigarettes    Passive exposure: Current   Smokeless tobacco: Never   Tobacco comments:    06/05/17 1 pack per 1 1/2 days    Smoking Cessation Classes, Agencies, Public librarian.  Vaping Use   Vaping status: Not on file  Substance and Sexual Activity   Alcohol use: Yes    Alcohol/week: 7.0 standard drinks of alcohol    Types: 7 Cans of beer per week   Drug use: Not Currently    Types: Cocaine   Sexual activity: Yes    Partners: Female  Other Topics Concern   Not on file  Social History Narrative   Lives with brother and his sister in [place].     Social Drivers of Corporate investment banker Strain: Low Risk  (04/23/2023)   Overall Financial Resource Strain (CARDIA)    Difficulty of Paying Living Expenses: Not hard at all  Food Insecurity: No Food Insecurity (04/23/2023)   Hunger Vital Sign    Worried About Running Out of Food in the Last Year: Never true    Ran Out of Food in the Last Year: Never true  Transportation Needs:  No Transportation Needs (04/23/2023)   PRAPARE - Administrator, Civil Service (Medical): No    Lack of Transportation (Non-Medical): No  Physical Activity: Inactive (04/23/2023)   Exercise Vital Sign    Days of Exercise per Week: 0 days    Minutes of Exercise per Session: 30 min  Stress: No Stress Concern Present (04/23/2023)   [employer] of Occupational Health - Occupational  Stress Questionnaire    Feeling of Stress : Not at all  Social Connections: Moderately Isolated (04/23/2023)   Social Connection and Isolation Panel [NHANES]    Frequency of Communication with Friends and Family: More than three times a week    Frequency of Social Gatherings with Friends and Family: More than three times a week    Attends Religious Services: 1 to 4 times per year    Active Member of [employer] or Organizations: No    Attends Banker Meetings: Never    Marital Status: Divorced  Catering manager Violence: Not At Risk (04/05/2023)   Humiliation, Afraid, Rape, and Kick questionnaire    Fear of Current or Ex-Partner: No    Emotionally Abused: No    Physically Abused: No    Sexually Abused: No     Review of Systems  All other systems reviewed and are negative.      Objective:   Physical Exam Vitals reviewed.  Constitutional:      Appearance: He is obese.  Cardiovascular:     Rate and Rhythm: Normal rate and regular rhythm.  Pulmonary:     Effort: Pulmonary effort is normal.     Breath sounds: Decreased air movement present. Wheezing present.  Musculoskeletal:     Lumbar back: Spasms and tenderness present. Decreased range of motion. Positive right straight leg raise test.       Legs:  Feet:     Right foot:     Skin integrity: No skin breakdown or erythema.  Neurological:     Mental Status: He is alert.   21        Assessment & Plan:  Right sided sciatica - Plan: DG Lumbar Spine Complete  Type 2 diabetes mellitus without complication, without long-term  current use of insulin (HCC) - Plan: Hemoglobin A1c, COMPLETE METABOLIC PANEL WITH GFR, Lipid panel, Microalbumin/Creatinine Ratio, Urine  Mixed diabetic hyperlipidemia associated with type 2 diabetes mellitus (HCC)  New persistent daily headache - Plan: Erenumab-aooe (AIMOVIG) 140 MG/ML SOAJ I believe the patient's symptoms are due to right-sided sciatica.  Begin a prednisone taper pack.  Increase gabapentin to 600 mg 3 times daily.  Obtain x-rays of the lumbar spine to evaluate for potential sources of nerve impingement.  Patient is overdue for fasting lab work regarding his diabetes.  Return fasting to check a hemoglobin A1c, CMP, fasting lipid panel, and a urine protein creatinine ratio.  Increase Ozempic to 2 mg subcu weekly to facilitate additional weight loss. Blood sugar is acceptable at 118/64

## 2023-04-25 ENCOUNTER — Telehealth: Payer: Self-pay

## 2023-04-25 NOTE — Telephone Encounter (Signed)
Send msg to via pt's Mychart that once we get the results, we will contact him.

## 2023-04-27 ENCOUNTER — Ambulatory Visit: Payer: 59 | Admitting: Family Medicine

## 2023-04-27 ENCOUNTER — Other Ambulatory Visit: Payer: 59

## 2023-04-27 DIAGNOSIS — E119 Type 2 diabetes mellitus without complications: Secondary | ICD-10-CM | POA: Diagnosis not present

## 2023-04-29 LAB — COMPLETE METABOLIC PANEL WITH GFR
AG Ratio: 1.1 (calc) (ref 1.0–2.5)
ALT: 19 U/L (ref 9–46)
AST: 21 U/L (ref 10–35)
Albumin: 4.1 g/dL (ref 3.6–5.1)
Alkaline phosphatase (APISO): 53 U/L (ref 35–144)
BUN: 15 mg/dL (ref 7–25)
CO2: 23 mmol/L (ref 20–32)
Calcium: 8.9 mg/dL (ref 8.6–10.3)
Chloride: 104 mmol/L (ref 98–110)
Creat: 0.77 mg/dL (ref 0.70–1.35)
Globulin: 3.8 g/dL — ABNORMAL HIGH (ref 1.9–3.7)
Glucose, Bld: 111 mg/dL — ABNORMAL HIGH (ref 65–99)
Potassium: 5.2 mmol/L (ref 3.5–5.3)
Sodium: 140 mmol/L (ref 135–146)
Total Bilirubin: 0.3 mg/dL (ref 0.2–1.2)
Total Protein: 7.9 g/dL (ref 6.1–8.1)
eGFR: 101 mL/min/{1.73_m2} (ref >=60)

## 2023-04-29 LAB — MICROALBUMIN / CREATININE URINE RATIO
Creatinine, Urine: 149 mg/dL (ref 20–320)
Microalb Creat Ratio: 6 mg/g{creat} (ref <30)
Microalb, Ur: 0.9 mg/dL

## 2023-04-29 LAB — HEMOGLOBIN A1C
Hgb A1c MFr Bld: 7.3 %{Hb} — ABNORMAL HIGH (ref <5.7)
Mean Plasma Glucose: 163 mg/dL
eAG (mmol/L): 9 mmol/L

## 2023-04-29 LAB — LIPID PANEL
Cholesterol: 183 mg/dL (ref <200)
HDL: 61 mg/dL (ref >=40)
LDL Cholesterol (Calc): 107 mg/dL — ABNORMAL HIGH
Non-HDL Cholesterol (Calc): 122 mg/dL (ref <130)
Total CHOL/HDL Ratio: 3 (calc) (ref <5.0)
Triglycerides: 68 mg/dL (ref <150)

## 2023-04-30 ENCOUNTER — Telehealth: Payer: Self-pay

## 2023-04-30 NOTE — Telephone Encounter (Signed)
Copied from CRM [id number]. Topic: Clinical - Lab/Test Results >> Apr 30, 2023 10:49 AM Payton Doughty wrote: Reason for CRM: Lenore, pt's niece calling w/ pt there for the results of his Xray.  He states they are iin his mychart, but he does not understand what it says Cb neice [phone]

## 2023-05-01 ENCOUNTER — Other Ambulatory Visit: Payer: Self-pay | Admitting: Family Medicine

## 2023-05-01 NOTE — Telephone Encounter (Unsigned)
Copied from CRM [id number]. Topic: Clinical - Medication Question >> May 01, 2023  8:59 AM Kenneth [name] wrote: Reason for CRM: Patient is asking if you are going to prescribe anything for the pain in back and burning in his leg. Callback number is [phone]

## 2023-05-01 NOTE — Telephone Encounter (Signed)
 Copied from CRM [id number]. Topic: Clinical - Medication Refill >> May 01, 2023  9:40 AM Arlina R wrote: Most Recent Primary Care Visit:  Provider: [facility]  Department: [facility]  Visit Type: LAB VISIT  Date: 04/27/2023  Medication: linaclotide  (LINZESS ) 145 MCG CAPS capsule  Has the patient contacted their pharmacy? Yes (Agent: If no, request that the patient contact the pharmacy for the refill. If patient does not wish to contact the pharmacy document the reason why and proceed with request.) (Agent: If yes, when and what did the pharmacy advise?)  Is this the correct pharmacy for this prescription? Yes If no, delete pharmacy and type the correct one.  This is the patient's preferred pharmacy:  [facility] - [place], [place] - [address] [place] [address] Phone: [phone] Fax: [phone]   Has the prescription been filled recently? Yes  Is the patient out of the medication? Yes  Has the patient been seen for an appointment in the last year OR does the patient have an upcoming appointment? Yes  Can we respond through MyChart? Yes  Agent: Please be advised that Rx refills may take up to 3 business days. We ask that you follow-up with your pharmacy.

## 2023-05-02 ENCOUNTER — Telehealth: Payer: Self-pay

## 2023-05-02 DIAGNOSIS — M5431 Sciatica, right side: Secondary | ICD-10-CM

## 2023-05-02 NOTE — Telephone Encounter (Signed)
 Copied from CRM [id number]. Topic: Referral - Request for Referral >> May 02, 2023  2:29 PM Elle L wrote: Did the patient discuss referral with their provider in the last year? Yes  Appointment offered? Yes  Type of order/referral and detailed reason for visit: Physical Therapy per recommendation from his imaging results.  Preference of office, provider, location: No preference but in [place] if possible.   If referral order, have you been seen by this specialty before? No  Can we respond through MyChart? Yes

## 2023-05-07 DIAGNOSIS — G4733 Obstructive sleep apnea (adult) (pediatric): Secondary | ICD-10-CM | POA: Diagnosis not present

## 2023-05-07 DIAGNOSIS — M170 Bilateral primary osteoarthritis of knee: Secondary | ICD-10-CM | POA: Diagnosis not present

## 2023-05-08 ENCOUNTER — Other Ambulatory Visit: Payer: Self-pay | Admitting: Family Medicine

## 2023-05-08 ENCOUNTER — Encounter: Payer: Self-pay | Admitting: Family Medicine

## 2023-05-08 MED ORDER — VARENICLINE TARTRATE (STARTER) 0.5 MG X 11 & 1 MG X 42 PO TBPK: ORAL_TABLET | ORAL | 0 refills | Status: AC

## 2023-05-21 ENCOUNTER — Other Ambulatory Visit: Payer: Self-pay | Admitting: Family Medicine

## 2023-05-21 DIAGNOSIS — M170 Bilateral primary osteoarthritis of knee: Secondary | ICD-10-CM | POA: Diagnosis not present

## 2023-05-28 DIAGNOSIS — M170 Bilateral primary osteoarthritis of knee: Secondary | ICD-10-CM | POA: Diagnosis not present

## 2023-06-04 DIAGNOSIS — M170 Bilateral primary osteoarthritis of knee: Secondary | ICD-10-CM | POA: Diagnosis not present

## 2023-06-12 ENCOUNTER — Other Ambulatory Visit: Payer: Self-pay | Admitting: Family Medicine

## 2023-06-12 DIAGNOSIS — J45909 Unspecified asthma, uncomplicated: Secondary | ICD-10-CM

## 2023-06-13 NOTE — Telephone Encounter (Signed)
 Requested Prescriptions  Pending Prescriptions Disp Refills   albuterol (VENTOLIN HFA) 108 (90 Base) MCG/ACT inhaler [Pharmacy Med Name: ALBUTEROL SULF HFA 90 MCG 108 (90 BAS Aerosol] 8.5 g 0    Sig: INHALE 2 PUFFS INTO THE LUNGS EVERY 4 HOURS AS NEEDED FOR SHORTNESS OF BREATH     Pulmonology:  Beta Agonists 2 Failed - 06/13/2023  1:40 PM      Failed - Valid encounter within last 12 months    Recent Outpatient Visits           1 year ago Right foot pain   [facility] Donita Brooks, MD   1 year ago Type 2 diabetes mellitus without complication, without long-term current use of insulin (HCC)   [facility] Pickard, Priscille Heidelberg, MD   2 years ago Chronic pain of right knee   [facility] Tanya Nones, Priscille Heidelberg, MD   2 years ago Right leg pain   [facility] Tanya Nones, Priscille Heidelberg, MD   2 years ago Plantar fasciitis, right   [facility] Pickard, Priscille Heidelberg, MD              Passed - Last BP in normal range    BP Readings from Last 1 Encounters:  04/23/23 118/64         Passed - Last Heart Rate in normal range    Pulse Readings from Last 1 Encounters:  04/23/23 96

## 2023-07-04 ENCOUNTER — Other Ambulatory Visit: Payer: Self-pay | Admitting: Family Medicine

## 2023-07-04 DIAGNOSIS — G4452 New daily persistent headache (NDPH): Secondary | ICD-10-CM

## 2023-07-05 DIAGNOSIS — E119 Type 2 diabetes mellitus without complications: Secondary | ICD-10-CM | POA: Diagnosis not present

## 2023-07-05 DIAGNOSIS — H04123 Dry eye syndrome of bilateral lacrimal glands: Secondary | ICD-10-CM | POA: Diagnosis not present

## 2023-07-05 DIAGNOSIS — H25813 Combined forms of age-related cataract, bilateral: Secondary | ICD-10-CM | POA: Diagnosis not present

## 2023-07-05 DIAGNOSIS — H524 Presbyopia: Secondary | ICD-10-CM | POA: Diagnosis not present

## 2023-07-05 NOTE — Telephone Encounter (Signed)
 Requested Prescriptions  Pending Prescriptions Disp Refills   topiramate (TOPAMAX) 25 MG tablet [Pharmacy Med Name: TOPIRAMATE 25 MG TABS 25 Tablet] 360 tablet 1    Sig: TAKE 2 TABLETS BY MOUTH 2 TIMES DAILY.     Neurology: Anticonvulsants - topiramate & zonisamide Passed - 07/05/2023  3:41 PM      Passed - Cr in normal range and within 360 days    Creat  Date Value Ref Range Status  04/27/2023 0.77 0.70 - 1.35 mg/dL Final   Creatinine, Urine  Date Value Ref Range Status  04/27/2023 149 20 - 320 mg/dL Final         Passed - CO2 in normal range and within 360 days    CO2  Date Value Ref Range Status  04/27/2023 23 20 - 32 mmol/L Final         Passed - ALT in normal range and within 360 days    ALT  Date Value Ref Range Status  04/27/2023 19 9 - 46 U/L Final         Passed - AST in normal range and within 360 days    AST  Date Value Ref Range Status  04/27/2023 21 10 - 35 U/L Final    Comment:    Results slightly increased due to hemolysis. Verna Czech - Completed PHQ-2 or PHQ-9 in the last 360 days      Passed - Valid encounter within last 12 months    Recent Outpatient Visits           2 months ago Right sided sciatica   [facility] Donita Brooks, MD   9 months ago Right foot pain   [facility] Tanya Nones, Priscille Heidelberg, MD   1 year ago Possible exposure to STD   [facility] Donita Brooks, MD   1 year ago Acute pain of left knee   [facility] Donita Brooks, MD   1 year ago Right foot pain    [facility] Pickard, Priscille Heidelberg, MD

## 2023-07-06 ENCOUNTER — Other Ambulatory Visit: Payer: Self-pay | Admitting: Family Medicine

## 2023-07-06 MED ORDER — FLUTICASONE-SALMETEROL 250-50 MCG/ACT IN AEPB: 1.0000 | INHALATION_SPRAY | Freq: Two times a day (BID) | RESPIRATORY_TRACT | 11 refills | Status: DC

## 2023-07-27 ENCOUNTER — Telehealth: Payer: Self-pay

## 2023-07-27 NOTE — Telephone Encounter (Signed)
 Copied from CRM [id number]. Topic: General - Other >> Jul 27, 2023  8:41 AM Stanly Early wrote: Reason for CRM: [facility]. Patient missed the call and would like a callback [phone]

## 2023-08-24 DIAGNOSIS — M21961 Unspecified acquired deformity of right lower leg: Secondary | ICD-10-CM | POA: Diagnosis not present

## 2023-08-24 DIAGNOSIS — B351 Tinea unguium: Secondary | ICD-10-CM | POA: Diagnosis not present

## 2023-08-24 DIAGNOSIS — I739 Peripheral vascular disease, unspecified: Secondary | ICD-10-CM | POA: Diagnosis not present

## 2023-08-24 DIAGNOSIS — L603 Nail dystrophy: Secondary | ICD-10-CM | POA: Diagnosis not present

## 2023-08-24 DIAGNOSIS — E1151 Type 2 diabetes mellitus with diabetic peripheral angiopathy without gangrene: Secondary | ICD-10-CM | POA: Diagnosis not present

## 2023-08-24 DIAGNOSIS — M21962 Unspecified acquired deformity of left lower leg: Secondary | ICD-10-CM | POA: Diagnosis not present

## 2023-09-09 DIAGNOSIS — L603 Nail dystrophy: Secondary | ICD-10-CM | POA: Diagnosis not present

## 2023-10-05 ENCOUNTER — Other Ambulatory Visit: Payer: Self-pay | Admitting: Family Medicine

## 2023-10-05 DIAGNOSIS — E782 Mixed hyperlipidemia: Secondary | ICD-10-CM

## 2023-10-09 DIAGNOSIS — G4733 Obstructive sleep apnea (adult) (pediatric): Secondary | ICD-10-CM | POA: Diagnosis not present

## 2023-10-23 ENCOUNTER — Telehealth: Payer: Self-pay | Admitting: Family Medicine

## 2023-10-23 ENCOUNTER — Other Ambulatory Visit: Payer: Self-pay | Admitting: Family Medicine

## 2023-10-23 DIAGNOSIS — E119 Type 2 diabetes mellitus without complications: Secondary | ICD-10-CM

## 2023-10-23 NOTE — Telephone Encounter (Signed)
 Prescription Request  10/23/2023  LOV: 04/23/2023  What is the name of the medication or equipment?   OZEMPIC , 0.25 OR 0.5 MG/DOSE, 2 MG/3ML SOPN [[id number]]  DISCONTINUED   Have you contacted your pharmacy to request a refill? Yes   Which pharmacy would you like this sent to?  [facility] - [place] - [address] [place] [name] Phone: [phone] Fax: [phone]    Patient notified that their request is being sent to the clinical staff for review and that they should receive a response within 2 business days.   Please advise pharmacist.

## 2023-11-06 ENCOUNTER — Encounter: Payer: Self-pay | Admitting: Family Medicine

## 2023-11-06 ENCOUNTER — Ambulatory Visit (INDEPENDENT_AMBULATORY_CARE_PROVIDER_SITE_OTHER): Admitting: Family Medicine

## 2023-11-06 VITALS — BP 132/74 | HR 87 | Temp 98.0°F | Ht 66.0 in | Wt 274.0 lb

## 2023-11-06 DIAGNOSIS — Z125 Encounter for screening for malignant neoplasm of prostate: Secondary | ICD-10-CM

## 2023-11-06 DIAGNOSIS — E119 Type 2 diabetes mellitus without complications: Secondary | ICD-10-CM

## 2023-11-06 DIAGNOSIS — Z1211 Encounter for screening for malignant neoplasm of colon: Secondary | ICD-10-CM | POA: Diagnosis not present

## 2023-11-06 DIAGNOSIS — Z7984 Long term (current) use of oral hypoglycemic drugs: Secondary | ICD-10-CM | POA: Diagnosis not present

## 2023-11-06 LAB — HM DIABETES EYE EXAM

## 2023-11-06 NOTE — Progress Notes (Signed)
 MERWYN HODAPP arrived 11/06/2023 and has given verbal consent to obtain images and complete their overdue diabetic retinal screening.  The images have been sent to an ophthalmologist or optometrist for review and interpretation.  Results will be sent back to Duanne Butler DASEN, MD for review.  Patient has been informed they will be contacted when we receive the results via telephone or MyChart

## 2023-11-06 NOTE — Addendum Note (Signed)
 Addended by: ANGELENA RONAL BRADLEY K on: 11/06/2023 12:54 PM   Modules accepted: Orders

## 2023-11-06 NOTE — Progress Notes (Signed)
 Wt Readings from Last 3 Encounters:  11/06/23 274 lb (124.3 kg)  04/23/23 285 lb (129.3 kg)  03/07/23 286 lb (129.7 kg)     Subjective:    Patient ID: Kenneth [name], male    DOB: 07-04-1960, 63 y.o.   MRN: [id number]  Patient is here to follow-up his diabetes.  He is currently on metformin  1000 mg a day, Ozempic  2 mg a day, and Actos  30 mg a day.  His hemoglobin A1c was 7.3 in January.  He had to stop Jardiance  due to balanitis.  He is due to recheck his sugars today.  He is also due for prostate cancer screening with a PSA.  He is also due for colon cancer screening.  He declines a colonoscopy but he will consent to Cologuard testing.  Blood pressure today is acceptable.  He denies any chest pain shortness of breath or dyspnea on exertion. Past Medical History:  Diagnosis Date   Arthritis    Asthma    Chronic bronchitis (HCC)    Cocaine abuse (HCC)    GERD (gastroesophageal reflux disease)    Headache    Hyperlipidemia    Hypertension    OSA treated with BiPAP    ahi-132, bipap 23/19   Syncope    Tobacco abuse    Past Surgical History:  Procedure Laterality Date   ESOPHAGOGASTRODUODENOSCOPY (EGD) WITH PROPOFOL  N/A 12/11/2012   Procedure: ESOPHAGOGASTRODUODENOSCOPY (EGD) WITH PROPOFOL ;  Surgeon: Elsie Cree, MD;  Location: [facility];  Service: Endoscopy;  Laterality: N/A;   FINGER AMPUTATION     KNEE ARTHROSCOPY Left 11/15/2015   Procedure: ARTHROSCOPY KNEE AND MEDIAL MENISECTOMY;  Surgeon: Donnice Car, MD;  Location: [facility];  Service: Orthopedics;  Laterality: Left;   KNEE ARTHROSCOPY WITH LATERAL MENISECTOMY Right 07/14/2020   Procedure: RIGHT KNEE PARTIAL LATERAL MENISCECTOMY;  Surgeon: Jerri Kay HERO, MD;  Location: [facility];  Service: Orthopedics;  Laterality: Right;   Current Outpatient Medications on File Prior to Visit  Medication Sig Dispense Refill   ACCU-CHEK GUIDE test strip USE AS DIRECTED TO MONITOR FSBS 2 TIMES A DAY 100 strip 1   Accu-Chek Softclix Lancets lancets  Use as instructed 100 each 12   albuterol  (VENTOLIN  HFA) 108 (90 Base) MCG/ACT inhaler INHALE 2 PUFFS INTO THE LUNGS EVERY 4 HOURS AS NEEDED FOR SHORTNESS OF BREATH 8.5 g 0   Allopurinol  200 MG TABS Take 200 mg by mouth daily. TAKE 1 TAB (200MG )  PO DAILY 90 tablet 3   amLODipine  (NORVASC ) 5 MG tablet TAKE 1 TABLET (5 MG TOTAL) BY MOUTH DAILY. 90 tablet 0   aspirin  81 MG chewable tablet Chew 81 mg by mouth in the morning.     atorvastatin  (LIPITOR) 40 MG tablet TAKE 1 TABLET (40 MG TOTAL) BY MOUTH DAILY. 90 tablet 3   blood glucose meter kit and supplies KIT Dispense based on patient and insurance preference. Use up to four times daily as directed. 1 each 11   Blood Glucose Monitoring Suppl (ACCU-CHEK AVIVA PLUS) w/Device KIT Use as directed to monitor FSBS 2x daily. Dx: E11.9 1 kit 1   celecoxib  (CELEBREX ) 200 MG capsule Take 1 capsule (200 mg total) by mouth 2 (two) times daily. 20 capsule 0   cyclobenzaprine  (FLEXERIL ) 10 MG tablet Take 0.5-1 tablets (5-10 mg total) by mouth 2 (two) times daily as needed for muscle spasms. 20 tablet 0   Erenumab -aooe (AIMOVIG ) 140 MG/ML SOAJ Inject 140 mg into the skin every 30 (thirty) days. 1 mL  11   fluticasone -salmeterol (ADVAIR  DISKUS) 250-50 MCG/ACT AEPB Inhale 1 puff into the lungs in the morning and at bedtime. 1 each 11   gabapentin  (NEURONTIN ) 300 MG capsule Take 2 capsules (600 mg total) by mouth 3 (three) times daily. TAKE 1 CAPSULE BY MOUTH 3 TIMES DAILY AS NEEDED (NERVE PAIN). 180 capsule 5   glucose blood test strip 1 each by Other route 2 (two) times daily. Use as instructed 100 each 12   HYDROcodone -acetaminophen  (NORCO) 5-325 MG tablet Take 1-2 tablets by mouth 2 (two) times daily as needed. 20 tablet 0   linaclotide  (LINZESS ) 145 MCG CAPS capsule Take 1 capsule (145 mcg total) by mouth daily before breakfast. 30 capsule 5   meloxicam  (MOBIC ) 15 MG tablet Take 1 tablet (15 mg total) by mouth daily. 30 tablet 0   metFORMIN  (GLUCOPHAGE ) 1000 MG  tablet TAKE 1 TABLET BY MOUTH 2 TIMES DAILY WITH A MEAL. 180 tablet 0   nitroGLYCERIN  (NITROSTAT ) 0.4 MG SL tablet Place 1 tablet (0.4 mg total) under the tongue every 5 (five) minutes as needed. For chest pain (Patient taking differently: Place 0.4 mg under the tongue every 5 (five) minutes x 3 doses as needed for chest pain.) 20 tablet 1   nystatin  cream (MYCOSTATIN ) Apply 1 Application topically 3 (three) times daily. 30 g 1   omeprazole  (PRILOSEC) 40 MG capsule TAKE 1 CAPSULE BY MOUTH DAILY. 90 capsule 3   OZEMPIC , 2 MG/DOSE, 8 MG/3ML SOPN INJECT 2 MG AS DIRECTED ONCE A WEEK. 3 mL 5   pioglitazone  (ACTOS ) 30 MG tablet TAKE 1 TABLET (30 MG TOTAL) BY MOUTH DAILY. 90 tablet 3   senna-docusate (SENOKOT-S) 8.6-50 MG tablet Take 1 tablet by mouth daily as needed (constipation).     topiramate  (TOPAMAX ) 25 MG tablet TAKE 2 TABLETS BY MOUTH 2 TIMES DAILY. 360 tablet 1   Varenicline  Tartrate, Starter, (CHANTIX  STARTING MONTH PAK) 0.5 MG X 11 & 1 MG X 42 TBPK Take 0.5 mg tablet by mouth 1x daily for 3 days, then increase to 0.5 mg tablet 2x daily for 4 days, then increase to 1 mg tablet 2x daily. 1 each 0   JARDIANCE  25 MG TABS tablet TAKE 1 TABLET (25 MG TOTAL) BY MOUTH DAILY BEFORE BREAKFAST. (Patient not taking: Reported on 11/06/2023) 30 tablet 4   Current Facility-Administered Medications on File Prior to Visit  Medication Dose Route Frequency Provider Last Rate Last Admin   lactated ringers  infusion    Continuous PRN Ouellette, Richard G, CRNA   New Bag at 11/15/15 1545   No Known Allergies Social History   Socioeconomic History   Marital status: Divorced    Spouse name: Not on file   Number of children: 0   Years of education: 12   Highest education level: 10th grade  Occupational History   Not on file  Tobacco Use   Smoking status: Every Day    Current packs/day: 0.50    Average packs/day: 0.5 packs/day for 40.0 years (20.0 ttl pk-yrs)    Types: Cigarettes    Passive exposure: Current    Smokeless tobacco: Never   Tobacco comments:    06/05/17 1 pack per 1 1/2 days    Smoking Cessation Classes, Agencies, Public librarian.  Vaping Use   Vaping status: Not on file  Substance and Sexual Activity   Alcohol  use: Yes    Alcohol /week: 7.0 standard drinks of alcohol     Types: 7 Cans of beer per week  Drug use: Not Currently    Types: Cocaine   Sexual activity: Yes    Partners: Female  Other Topics Concern   Not on file  Social History Narrative   Lives with brother and his sister in law.     Social Drivers of Corporate investment banker Strain: Low Risk  (04/23/2023)   Overall Financial Resource Strain (CARDIA)    Difficulty of Paying Living Expenses: Not hard at all  Food Insecurity: No Food Insecurity (04/23/2023)   Hunger Vital Sign    Worried About Running Out of Food in the Last Year: Never true    Ran Out of Food in the Last Year: Never true  Transportation Needs: No Transportation Needs (04/23/2023)   PRAPARE - Administrator, Civil Service (Medical): No    Lack of Transportation (Non-Medical): No  Physical Activity: Inactive (04/23/2023)   Exercise Vital Sign    Days of Exercise per Week: 0 days    Minutes of Exercise per Session: 30 min  Stress: No Stress Concern Present (04/23/2023)   [employer] of Occupational Health - Occupational Stress Questionnaire    Feeling of Stress : Not at all  Social Connections: Moderately Isolated (04/23/2023)   Social Connection and Isolation Panel    Frequency of Communication with Friends and Family: More than three times a week    Frequency of Social Gatherings with Friends and Family: More than three times a week    Attends Religious Services: 1 to 4 times per year    Active Member of [employer] or Organizations: No    Attends Banker Meetings: Never    Marital Status: Divorced  Catering manager Violence: Not At Risk (04/05/2023)   Humiliation, Afraid, Rape, and Kick questionnaire     Fear of Current or Ex-Partner: No    Emotionally Abused: No    Physically Abused: No    Sexually Abused: No     Review of Systems  All other systems reviewed and are negative.      Objective:   Physical Exam Vitals reviewed.  Constitutional:      Appearance: He is obese.  Cardiovascular:     Rate and Rhythm: Normal rate and regular rhythm.  Pulmonary:     Effort: Pulmonary effort is normal.     Breath sounds: Normal breath sounds.  Musculoskeletal:     Right knee: Swelling and effusion present. No bony tenderness. Decreased range of motion. Tenderness present over the medial joint line and lateral joint line.  Neurological:     Mental Status: He is alert.           Assessment & Plan:   Type 2 diabetes mellitus without complication, without long-term current use of insulin  (HCC) - Plan: Hemoglobin A1c, CBC with Differential/Platelet, Comprehensive metabolic panel with GFR, Lipid panel, Microalbumin/Creatinine Ratio, Urine  Prostate cancer screening - Plan: PSA  Colon cancer screening - Plan: Cologuard Blood pressure is acceptable.  Check hemoglobin A1c.  Goal A1c is less than 6.5.  Consider switching Ozempic  to Mounjaro  to maximize weight loss and A1c reduction.  Patient agrees to this plan if insurance will cover it.  Meanwhile check urine microalbumin to evaluate for any evidence of diabetic nephropathy.  Check fasting lipid panel.  Goal LDL cholesterol is less than 100.  Check PSA to screen for prostate cancer.  Check Cologuard to screen for colon cancer.

## 2023-11-07 LAB — COMPREHENSIVE METABOLIC PANEL WITH GFR
AG Ratio: 1.2 (calc) (ref 1.0–2.5)
ALT: 19 U/L (ref 9–46)
AST: 19 U/L (ref 10–35)
Albumin: 4.4 g/dL (ref 3.6–5.1)
Alkaline phosphatase (APISO): 61 U/L (ref 35–144)
BUN: 12 mg/dL (ref 7–25)
CO2: 24 mmol/L (ref 20–32)
Calcium: 9.5 mg/dL (ref 8.6–10.3)
Chloride: 106 mmol/L (ref 98–110)
Creat: 0.73 mg/dL (ref 0.70–1.35)
Globulin: 3.6 g/dL (ref 1.9–3.7)
Glucose, Bld: 83 mg/dL (ref 65–99)
Potassium: 4.3 mmol/L (ref 3.5–5.3)
Sodium: 142 mmol/L (ref 135–146)
Total Bilirubin: 0.2 mg/dL (ref 0.2–1.2)
Total Protein: 8 g/dL (ref 6.1–8.1)
eGFR: 102 mL/min/1.73m2 (ref >=60)

## 2023-11-07 LAB — CBC WITH DIFFERENTIAL/PLATELET
Absolute Lymphocytes: 2464 {cells}/uL (ref 850–3900)
Absolute Monocytes: 653 {cells}/uL (ref 200–950)
Basophils Absolute: 31 {cells}/uL (ref 0–200)
Basophils Relative: 0.5 %
Eosinophils Absolute: 220 {cells}/uL (ref 15–500)
Eosinophils Relative: 3.6 %
HCT: 47.9 % (ref 38.5–50.0)
Hemoglobin: 15.1 g/dL (ref 13.2–17.1)
MCH: 26.8 pg — ABNORMAL LOW (ref 27.0–33.0)
MCHC: 31.5 g/dL — ABNORMAL LOW (ref 32.0–36.0)
MCV: 85.1 fL (ref 80.0–100.0)
MPV: 11.7 fL (ref 7.5–12.5)
Monocytes Relative: 10.7 %
Neutro Abs: 2733 {cells}/uL (ref 1500–7800)
Neutrophils Relative %: 44.8 %
Platelets: 261 Thousand/uL (ref 140–400)
RBC: 5.63 Million/uL (ref 4.20–5.80)
RDW: 13.8 % (ref 11.0–15.0)
Total Lymphocyte: 40.4 %
WBC: 6.1 Thousand/uL (ref 3.8–10.8)

## 2023-11-07 LAB — HEMOGLOBIN A1C
Hgb A1c MFr Bld: 6.3 % — ABNORMAL HIGH (ref <5.7)
Mean Plasma Glucose: 134 mg/dL
eAG (mmol/L): 7.4 mmol/L

## 2023-11-07 LAB — LIPID PANEL
Cholesterol: 165 mg/dL (ref <200)
HDL: 57 mg/dL (ref >=40)
LDL Cholesterol (Calc): 91 mg/dL
Non-HDL Cholesterol (Calc): 108 mg/dL (ref <130)
Total CHOL/HDL Ratio: 2.9 (calc) (ref <5.0)
Triglycerides: 81 mg/dL (ref <150)

## 2023-11-07 LAB — PSA: PSA: 0.75 ng/mL (ref <=4.00)

## 2023-11-08 ENCOUNTER — Ambulatory Visit: Payer: Self-pay | Admitting: Family Medicine

## 2023-11-09 DIAGNOSIS — G4733 Obstructive sleep apnea (adult) (pediatric): Secondary | ICD-10-CM | POA: Diagnosis not present

## 2023-11-12 ENCOUNTER — Telehealth: Payer: Self-pay

## 2023-11-12 NOTE — Telephone Encounter (Signed)
 Copied from CRM #[id number]. Topic: Clinical - Medication Question >> Nov 12, 2023  9:26 AM Myrick T wrote: Reason for CRM: patient called stated he would like to take the Mounjaro  for weight loss. Please f/u with patient

## 2023-11-13 ENCOUNTER — Other Ambulatory Visit: Payer: Self-pay | Admitting: Family Medicine

## 2023-11-14 ENCOUNTER — Other Ambulatory Visit: Payer: Self-pay | Admitting: Family Medicine

## 2023-11-14 NOTE — Telephone Encounter (Signed)
 Prescription Request  11/14/2023  LOV: 11/06/2023  What is the name of the medication or equipment?   JARDIANCE  25 MG TABS tablet   Have you contacted your pharmacy to request a refill? Yes   Which pharmacy would you like this sent to?  [facility] - [place], [place] - [address] [place] [address] Phone: [phone] Fax: [phone]    Patient notified that their request is being sent to the clinical staff for review and that they should receive a response within 2 business days.   Please advise pharmacist.

## 2023-11-15 ENCOUNTER — Telehealth: Payer: Self-pay

## 2023-11-15 ENCOUNTER — Other Ambulatory Visit: Payer: Self-pay

## 2023-11-15 DIAGNOSIS — E119 Type 2 diabetes mellitus without complications: Secondary | ICD-10-CM

## 2023-11-15 DIAGNOSIS — E1169 Type 2 diabetes mellitus with other specified complication: Secondary | ICD-10-CM

## 2023-11-15 MED ORDER — TIRZEPATIDE 10 MG/0.5ML ~~LOC~~ SOAJ: 10.0000 mg | SUBCUTANEOUS | 1 refills | Status: DC

## 2023-11-15 NOTE — Telephone Encounter (Signed)
 Dr. Duanne, did you advise Kenneth [name] to stop the Jardiance ? I didn't see any documentation in the chart to stop it, only the Ozempic . Thank you.   Copied from CRM [id number]. Topic: Clinical - Prescription Issue >> Nov 15, 2023  2:59 PM Kenneth [name] wrote: Reason for CRM: Patient requested on 08/18 to be place on Mounjaro  instead of Ozempic . (See notes on 08/18 Telephone encounter) Appears it was approved but medication was not called into patients pharmacy.  [facility] - [place], [place] - [address] [place] [address] Phone: [phone] Fax: [phone]  Also states Dr Kenneth [name] told him to stop taking the empagliflozin  (JARDIANCE ) 25 MG TABS tablet, but a refill was just called in.  Patient can be reached at [phone]

## 2023-11-15 NOTE — Telephone Encounter (Signed)
 Requested Prescriptions  Pending Prescriptions Disp Refills   empagliflozin  (JARDIANCE ) 25 MG TABS tablet [Pharmacy Med Name: JARDIANCE  25 MG TABLET 25 Tablet] 90 tablet 0    Sig: TAKE 1 TABLET (25 MG TOTAL) BY MOUTH DAILY BEFORE BREAKFAST.     Endocrinology:  Diabetes - SGLT2 Inhibitors Passed - 11/15/2023 10:34 AM      Passed - Cr in normal range and within 360 days    Creat  Date Value Ref Range Status  11/06/2023 0.73 0.70 - 1.35 mg/dL Final   Creatinine, Urine  Date Value Ref Range Status  04/27/2023 149 20 - 320 mg/dL Final         Passed - HBA1C is between 0 and 7.9 and within 180 days    Hgb A1C (fingerstick)  Date Value Ref Range Status  01/28/2016 >14.0 (H) <5.7 % Final    Comment:                                                                           According to the ADA Clinical Practice Recommendations for 2011, when HbA1c is used as a screening test:     >=6.5%   Diagnostic of Diabetes Mellitus            (if abnormal result is confirmed)   5.7-6.4%   Increased risk of developing Diabetes Mellitus   References:Diagnosis and Classification of Diabetes Mellitus,Diabetes Care,2011,34(Suppl 1):S62-S69 and Standards of Medical Care in         Diabetes - 2011,Diabetes Care,2011,34 (Suppl 1):S11-S61.      Hgb A1c MFr Bld  Date Value Ref Range Status  11/06/2023 6.3 (H) <5.7 % Final    Comment:    For someone without known diabetes, a hemoglobin  A1c value between 5.7% and 6.4% is consistent with prediabetes and should be confirmed with a  follow-up test. . For someone with known diabetes, a value <7% indicates that their diabetes is well controlled. A1c targets should be individualized based on duration of diabetes, age, comorbid conditions, and other considerations. . This assay result is consistent with an increased risk of diabetes. . Currently, no consensus exists regarding use of hemoglobin A1c for diagnosis of diabetes for children. .           Passed - eGFR in normal range and within 360 days    GFR, Est African American  Date Value Ref Range Status  12/18/2019 110 > OR = 60 mL/min/1.54m2 Final   GFR, Est Non African American  Date Value Ref Range Status  12/18/2019 95 > OR = 60 mL/min/1.31m2 Final   GFR, Estimated  Date Value Ref Range Status  06/15/2021 >60 >60 mL/min Final    Comment:    (NOTE) Calculated using the CKD-EPI Creatinine Equation (2021)    eGFR  Date Value Ref Range Status  11/06/2023 102 > OR = 60 mL/min/1.39m2 Final         Passed - Valid encounter within last 6 months    Recent Outpatient Visits           1 week ago Type 2 diabetes mellitus without complication, without long-term current use of insulin  [facility])   [facility] [place], Butler DASEN,  MD   6 months ago Right sided sciatica   [facility] [place], Butler DASEN, MD   1 year ago Right foot pain   [facility] [facility] Duanne, Butler DASEN, MD   1 year ago Possible exposure to STD   [facility] [place], Butler DASEN, MD   1 year ago Acute pain of left knee   [facility] [place], Butler DASEN, MD

## 2023-11-15 NOTE — Telephone Encounter (Signed)
 Refused Jardiance  25 mg because this is a duplicate request.   It was sent in 11/15/2023 #90, 0 refills.

## 2023-11-16 DIAGNOSIS — Z1211 Encounter for screening for malignant neoplasm of colon: Secondary | ICD-10-CM | POA: Diagnosis not present

## 2023-11-23 DIAGNOSIS — E1151 Type 2 diabetes mellitus with diabetic peripheral angiopathy without gangrene: Secondary | ICD-10-CM | POA: Diagnosis not present

## 2023-11-23 DIAGNOSIS — L84 Corns and callosities: Secondary | ICD-10-CM | POA: Diagnosis not present

## 2023-11-23 DIAGNOSIS — M21962 Unspecified acquired deformity of left lower leg: Secondary | ICD-10-CM | POA: Diagnosis not present

## 2023-11-23 DIAGNOSIS — M21961 Unspecified acquired deformity of right lower leg: Secondary | ICD-10-CM | POA: Diagnosis not present

## 2023-11-26 DIAGNOSIS — G4733 Obstructive sleep apnea (adult) (pediatric): Secondary | ICD-10-CM | POA: Diagnosis not present

## 2023-11-28 ENCOUNTER — Ambulatory Visit: Payer: Self-pay

## 2023-11-28 NOTE — Telephone Encounter (Signed)
 FYI Only or Action Required?: Action required by provider: clinical question for provider.  Patient was last seen in primary care on 11/06/2023 by Duanne Butler DASEN, MD.  Called Nurse Triage reporting Medication Problem.  Triage Disposition: Call PCP When Office is Open  Patient/caregiver understands and will follow disposition?: Yes  Copied from CRM #[id number]. Topic: Clinical - Red Word Triage >> Nov 28, 2023  2:27 PM Marissa P wrote: Red Word that prompted transfer to Nurse Triage: The current asthma pumps are not working for him that was refilled and he is used to the old ones Reason for Disposition  [1] Caller has NON-URGENT medicine question about med that PCP prescribed AND [2] triager unable to answer question  Answer Assessment - Initial Assessment Questions 1. NAME of MEDICINE: What medicine(s) are you calling about?     Advair  Diskus inhalation powder.  2. QUESTION: What is your question? (e.g., double dose of medicine, side effect)     Patient is asking if he can get this medication in an inhaler vs the disk. Patient is having a difficult time using the inhalation powder diskus.  3. PRESCRIBER: Who prescribed the medicine? Reason: if prescribed by specialist, call should be referred to that group.     [place] 4. SYMPTOMS: Do you have any symptoms? If Yes, ask: What symptoms are you having?  How bad are the symptoms (e.g., mild, moderate, severe)     No symptoms  Patient's niece called for patient. Asking if patient can go back to Advair  inhaler instead of the Advair  Diskus inhalation powder. Patient states he can't use the inhalation powder. He has been using his old Advair  inhaler. Asking for a call back from office.  Protocols used: Medication Question Call-A-AH

## 2023-11-29 ENCOUNTER — Other Ambulatory Visit: Payer: Self-pay

## 2023-11-29 DIAGNOSIS — J45909 Unspecified asthma, uncomplicated: Secondary | ICD-10-CM

## 2023-11-29 MED ORDER — ALBUTEROL SULFATE HFA 108 (90 BASE) MCG/ACT IN AERS: INHALATION_SPRAY | RESPIRATORY_TRACT | 2 refills | Status: DC

## 2023-11-29 NOTE — Telephone Encounter (Signed)
 Reviewed DPR, tried to call pt niece, vm box has not been set up.

## 2023-11-30 LAB — COLOGUARD
COLOGUARD: NEGATIVE
Cologuard: NEGATIVE

## 2023-12-10 DIAGNOSIS — M170 Bilateral primary osteoarthritis of knee: Secondary | ICD-10-CM | POA: Diagnosis not present

## 2023-12-17 DIAGNOSIS — M170 Bilateral primary osteoarthritis of knee: Secondary | ICD-10-CM | POA: Diagnosis not present

## 2023-12-18 DIAGNOSIS — M25512 Pain in left shoulder: Secondary | ICD-10-CM | POA: Diagnosis not present

## 2023-12-18 DIAGNOSIS — E119 Type 2 diabetes mellitus without complications: Secondary | ICD-10-CM | POA: Diagnosis not present

## 2023-12-26 DIAGNOSIS — M25512 Pain in left shoulder: Secondary | ICD-10-CM | POA: Diagnosis not present

## 2023-12-31 DIAGNOSIS — M7502 Adhesive capsulitis of left shoulder: Secondary | ICD-10-CM | POA: Diagnosis not present

## 2023-12-31 DIAGNOSIS — M7522 Bicipital tendinitis, left shoulder: Secondary | ICD-10-CM | POA: Diagnosis not present

## 2023-12-31 DIAGNOSIS — M75112 Incomplete rotator cuff tear or rupture of left shoulder, not specified as traumatic: Secondary | ICD-10-CM | POA: Diagnosis not present

## 2023-12-31 DIAGNOSIS — M19012 Primary osteoarthritis, left shoulder: Secondary | ICD-10-CM | POA: Diagnosis not present

## 2024-01-01 ENCOUNTER — Other Ambulatory Visit: Payer: Self-pay | Admitting: Family Medicine

## 2024-01-01 DIAGNOSIS — G4452 New daily persistent headache (NDPH): Secondary | ICD-10-CM

## 2024-01-01 MED ORDER — AIMOVIG 140 MG/ML ~~LOC~~ SOAJ: 140.0000 mg | SUBCUTANEOUS | 11 refills | Status: DC

## 2024-01-01 NOTE — Telephone Encounter (Signed)
 Copied from CRM [id number]. Topic: Clinical - Medication Refill >> Jan 01, 2024  9:43 AM Joesph B wrote: Medication:  tirzepatide  (MOUNJARO ) 10 MG/0.5ML Pen (patient says the provider is suppose to up the dosage)  Erenumab -aooe (AIMOVIG ) 140 MG/ML SOAJ    Has the patient contacted their pharmacy? Yes (Agent: If no, request that the patient contact the pharmacy for the refill. If patient does not wish to contact the pharmacy document the reason why and proceed with request.) (Agent: If yes, when and what did the pharmacy advise?)  This is the patient's preferred pharmacy:  [facility] - [place], [place] - [address] [place] [address] Phone: [phone] Fax: [phone]  Is this the correct pharmacy for this prescription? Yes If no, delete pharmacy and type the correct one.   Has the prescription been filled recently? Yes  Is the patient out of the medication? Yes  Has the patient been seen for an appointment in the last year OR does the patient have an upcoming appointment? Yes  Can we respond through MyChart? Yes  Agent: Please be advised that Rx refills may take up to 3 business days. We ask that you follow-up with your pharmacy.

## 2024-01-07 DIAGNOSIS — G4733 Obstructive sleep apnea (adult) (pediatric): Secondary | ICD-10-CM | POA: Diagnosis not present

## 2024-01-09 DIAGNOSIS — G4733 Obstructive sleep apnea (adult) (pediatric): Secondary | ICD-10-CM | POA: Diagnosis not present

## 2024-01-10 ENCOUNTER — Other Ambulatory Visit: Payer: Self-pay | Admitting: Family Medicine

## 2024-01-10 DIAGNOSIS — E1169 Type 2 diabetes mellitus with other specified complication: Secondary | ICD-10-CM

## 2024-01-10 DIAGNOSIS — E119 Type 2 diabetes mellitus without complications: Secondary | ICD-10-CM

## 2024-01-16 ENCOUNTER — Other Ambulatory Visit: Payer: Self-pay | Admitting: Family Medicine

## 2024-01-18 DIAGNOSIS — E1151 Type 2 diabetes mellitus with diabetic peripheral angiopathy without gangrene: Secondary | ICD-10-CM | POA: Diagnosis not present

## 2024-01-18 DIAGNOSIS — M25512 Pain in left shoulder: Secondary | ICD-10-CM | POA: Diagnosis not present

## 2024-01-18 DIAGNOSIS — I739 Peripheral vascular disease, unspecified: Secondary | ICD-10-CM | POA: Diagnosis not present

## 2024-01-18 DIAGNOSIS — L603 Nail dystrophy: Secondary | ICD-10-CM | POA: Diagnosis not present

## 2024-01-18 DIAGNOSIS — R262 Difficulty in walking, not elsewhere classified: Secondary | ICD-10-CM | POA: Diagnosis not present

## 2024-01-22 DIAGNOSIS — M170 Bilateral primary osteoarthritis of knee: Secondary | ICD-10-CM | POA: Diagnosis not present

## 2024-02-25 ENCOUNTER — Other Ambulatory Visit: Payer: Self-pay | Admitting: Family Medicine

## 2024-02-25 ENCOUNTER — Ambulatory Visit: Payer: Self-pay

## 2024-02-25 DIAGNOSIS — E1169 Type 2 diabetes mellitus with other specified complication: Secondary | ICD-10-CM

## 2024-02-25 DIAGNOSIS — E119 Type 2 diabetes mellitus without complications: Secondary | ICD-10-CM

## 2024-02-25 NOTE — Telephone Encounter (Signed)
 FYI Only or Action Required?: FYI only for provider: No appts in office today, UC advised.  Patient was last seen in primary care on 11/06/2023 by Duanne Butler DASEN, MD.  Called Nurse Triage reporting Knee Pain.  Symptoms began a week ago. Hx of chronic knee pain, worse over past week  Interventions attempted: Other: Declines further triage.  Symptoms are: gradually worsening.  Triage Disposition: See HCP Within 4 Hours (Or PCP Triage)  Patient/caregiver understands and will follow disposition?: Yes  Copied from CRM #[id number]. Topic: Clinical - Red Word Triage >> Feb 25, 2024  2:12 PM Antwanette L wrote: Red Word that prompted transfer to Nurse Triage: mary, the pt sister is calling bc the pt is having left knee pain. Pt is requesting a cortisone shot Reason for Disposition  [1] SEVERE pain (e.g., excruciating, unable to walk) AND [2] not improved after 2 hours of pain medicine  Answer Assessment - Initial Assessment Questions Pt with hx of chronic left knee pain reports increased 10/10 aching pain with swelling. Denies redness, fever or recent injury/trauma. Able to get around without ambulatory aids for short distances. Declines further triage, pt asking for a cortisone shot. Soonest appt availability in office not until tomorrow. Advised UC, today, pt agreeable to go. Pt hung up before being able to provide care instructions and ED precautions.   1. LOCATION and RADIATION: Where is the pain located?      Left knee  2. QUALITY: What does the pain feel like?  (e.g., sharp, dull, aching, burning)     Aching  3. SEVERITY: How bad is the pain? What does it keep you from doing?   (Scale 1-10; or mild, moderate, severe)     10/10, baseline pain 5/10  4. ONSET: When did the pain start? Does it come and go, or is it there all the time?     1 week ago  5. RECURRENT: Have you had this pain before? If Yes, ask: When, and what happened then?     Chronic knee pain   6.  SETTING: Has there been any recent work, exercise or other activity that involved that part of the body?      Denies  7. AGGRAVATING FACTORS: What makes the knee pain worse? (e.g., walking, climbing stairs, running)     Walking  8. ASSOCIATED SYMPTOMS: Is there any swelling or redness of the knee?     Denies redness, just swelling and pain  9. OTHER SYMPTOMS: Do you have any other symptoms? (e.g., calf pain, chest pain, difficulty breathing, fever)     Swelling in knee, able to get around without ambulatory aids for short distances.  Protocols used: Knee Pain-A-AH

## 2024-03-05 ENCOUNTER — Other Ambulatory Visit: Payer: Self-pay | Admitting: Family Medicine

## 2024-03-10 ENCOUNTER — Encounter: Payer: Self-pay | Admitting: Adult Health

## 2024-03-10 ENCOUNTER — Ambulatory Visit: Payer: 59 | Admitting: Adult Health

## 2024-03-10 VITALS — BP 114/65 | HR 91 | Ht 66.0 in | Wt 279.0 lb

## 2024-03-10 DIAGNOSIS — G4733 Obstructive sleep apnea (adult) (pediatric): Secondary | ICD-10-CM

## 2024-03-10 NOTE — Patient Instructions (Signed)
 Continue using CPAP nightly and greater than 4 hours each night If your symptoms worsen or you develop new symptoms please let us  know.

## 2024-03-10 NOTE — Progress Notes (Signed)
 PATIENT: Kenneth [name] DOB: Aug 08, 1960  REASON FOR VISIT: follow up HISTORY FROM: patient PRIMARY NEUROLOGIST: Dr. Buck  Chief Complaint  Patient presents with   Follow-up    Pt in 4 alone Pt here cpap f/u Pt states no questions or concerns for today's visit      HISTORY OF PRESENT ILLNESS: Today 03/10/2024:  Kenneth [name] is a 63 y.o. male with a history of obstructive sleep apnea on BiPAP. Returns today for follow-up.  Patient reports that the BiPAP is working well for him.  He denies any new concerns.  Currently wears a fullface mask.  His download is below.         REVIEW OF SYSTEMS: Out of a complete 14 system review of symptoms, the patient complains only of the following symptoms, and all other reviewed systems are negative.  ESS 6  ALLERGIES: Allergies[1]  HOME MEDICATIONS: Outpatient Medications Prior to Visit  Medication Sig Dispense Refill   ACCU-CHEK GUIDE test strip USE AS DIRECTED TO MONITOR FSBS 2 TIMES A DAY 100 strip 1   Accu-Chek Softclix Lancets lancets Use as instructed 100 each 12   albuterol  (VENTOLIN  HFA) 108 (90 Base) MCG/ACT inhaler INHALE 2 PUFFS INTO THE LUNGS EVERY 4 HOURS AS NEEDED FOR SHORTNESS OF BREATH 8.5 g 2   Allopurinol  200 MG TABS Take 200 mg by mouth daily. TAKE 1 TAB (200MG )  PO DAILY 90 tablet 3   amLODipine  (NORVASC ) 5 MG tablet TAKE 1 TABLET (5 MG TOTAL) BY MOUTH DAILY. 90 tablet 0   aspirin  81 MG chewable tablet Chew 81 mg by mouth in the morning.     atorvastatin  (LIPITOR) 40 MG tablet TAKE 1 TABLET (40 MG TOTAL) BY MOUTH DAILY. 90 tablet 3   blood glucose meter kit and supplies KIT Dispense based on patient and insurance preference. Use up to four times daily as directed. 1 each 11   Blood Glucose Monitoring Suppl (ACCU-CHEK AVIVA PLUS) w/Device KIT Use as directed to monitor FSBS 2x daily. Dx: E11.9 1 kit 1   celecoxib  (CELEBREX ) 200 MG capsule Take 1 capsule (200 mg total) by mouth 2 (two) times daily. 20 capsule 0    cyclobenzaprine  (FLEXERIL ) 10 MG tablet Take 0.5-1 tablets (5-10 mg total) by mouth 2 (two) times daily as needed for muscle spasms. 20 tablet 0   Erenumab -aooe (AIMOVIG ) 140 MG/ML SOAJ Inject 140 mg into the skin every 30 (thirty) days. 1 mL 11   fluticasone -salmeterol (ADVAIR  DISKUS) 250-50 MCG/ACT AEPB Inhale 1 puff into the lungs in the morning and at bedtime. 1 each 11   gabapentin  (NEURONTIN ) 300 MG capsule Take 2 capsules (600 mg total) by mouth 3 (three) times daily. TAKE 1 CAPSULE BY MOUTH 3 TIMES DAILY AS NEEDED (NERVE PAIN). 180 capsule 5   glucose blood test strip 1 each by Other route 2 (two) times daily. Use as instructed 100 each 12   HYDROcodone -acetaminophen  (NORCO) 5-325 MG tablet Take 1-2 tablets by mouth 2 (two) times daily as needed. 20 tablet 0   JARDIANCE  25 MG TABS tablet TAKE 1 TABLET (25 MG TOTAL) BY MOUTH DAILY BEFORE BREAKFAST. 90 tablet 0   linaclotide  (LINZESS ) 145 MCG CAPS capsule Take 1 capsule (145 mcg total) by mouth daily before breakfast. 30 capsule 5   meloxicam  (MOBIC ) 15 MG tablet Take 1 tablet (15 mg total) by mouth daily. 30 tablet 0   metFORMIN  (GLUCOPHAGE ) 1000 MG tablet TAKE 1 TABLET BY MOUTH 2 TIMES DAILY WITH  A MEAL. 180 tablet 0   MOUNJARO  10 MG/0.5ML Pen INJECT 10 MG INTO THE SKIN ONCE A WEEK. 2 mL 1   nitroGLYCERIN  (NITROSTAT ) 0.4 MG SL tablet Place 1 tablet (0.4 mg total) under the tongue every 5 (five) minutes as needed. For chest pain (Patient taking differently: Place 0.4 mg under the tongue every 5 (five) minutes x 3 doses as needed for chest pain.) 20 tablet 1   nystatin  cream (MYCOSTATIN ) Apply 1 Application topically 3 (three) times daily. 30 g 1   omeprazole  (PRILOSEC) 40 MG capsule TAKE 1 CAPSULE BY MOUTH DAILY. 90 capsule 3   pioglitazone  (ACTOS ) 30 MG tablet TAKE 1 TABLET (30 MG TOTAL) BY MOUTH DAILY. 90 tablet 3   senna-docusate (SENOKOT-S) 8.6-50 MG tablet Take 1 tablet by mouth daily as needed (constipation).     topiramate  (TOPAMAX ) 25  MG tablet TAKE 2 TABLETS BY MOUTH 2 TIMES DAILY. 360 tablet 1   Varenicline  Tartrate, Starter, (CHANTIX  STARTING MONTH PAK) 0.5 MG X 11 & 1 MG X 42 TBPK Take 0.5 mg tablet by mouth 1x daily for 3 days, then increase to 0.5 mg tablet 2x daily for 4 days, then increase to 1 mg tablet 2x daily. 1 each 0   Facility-Administered Medications Prior to Visit  Medication Dose Route Frequency Provider Last Rate Last Admin   lactated ringers  infusion    Continuous PRN Ouellette, Richard G, CRNA   New Bag at 11/15/15 1545    PAST MEDICAL HISTORY: Past Medical History:  Diagnosis Date   Arthritis    Asthma    Chronic bronchitis (HCC)    Cocaine abuse (HCC)    Diabetes mellitus without complication (HCC)    GERD (gastroesophageal reflux disease)    Headache    Hyperlipidemia    Hypertension    OSA treated with BiPAP    ahi-132, bipap 23/19   Syncope    Tobacco abuse     PAST SURGICAL HISTORY: Past Surgical History:  Procedure Laterality Date   ESOPHAGOGASTRODUODENOSCOPY (EGD) WITH PROPOFOL  N/A 12/11/2012   Procedure: ESOPHAGOGASTRODUODENOSCOPY (EGD) WITH PROPOFOL ;  Surgeon: Elsie Cree, MD;  Location: [facility];  Service: Endoscopy;  Laterality: N/A;   FINGER AMPUTATION     KNEE ARTHROSCOPY Left 11/15/2015   Procedure: ARTHROSCOPY KNEE AND MEDIAL MENISECTOMY;  Surgeon: Donnice Car, MD;  Location: [facility];  Service: Orthopedics;  Laterality: Left;   KNEE ARTHROSCOPY WITH LATERAL MENISECTOMY Right 07/14/2020   Procedure: RIGHT KNEE PARTIAL LATERAL MENISCECTOMY;  Surgeon: Jerri Kay HERO, MD;  Location: [facility];  Service: Orthopedics;  Laterality: Right;    FAMILY HISTORY: Family History  Problem Relation Age of Onset   Hypertension Father    Diabetes Father    Diabetes Sister    Stroke Sister    Diabetes Brother    Diabetes Other        Multiple family members   Sleep apnea Neg Hx     SOCIAL HISTORY: Social History   Socioeconomic History   Marital status: Divorced    Spouse  name: Not on file   Number of children: 0   Years of education: 12   Highest education level: 10th grade  Occupational History   Not on file  Tobacco Use   Smoking status: Every Day    Current packs/day: 0.50    Average packs/day: 0.5 packs/day for 40.0 years (20.0 ttl pk-yrs)    Types: Cigarettes    Passive exposure: Current   Smokeless tobacco: Never   Tobacco  comments:    06/05/17 1 pack per 1 1/2 days    Smoking Cessation Classes, Agencies, Services & Resources Offered.  Vaping Use   Vaping status: Not on file  Substance and Sexual Activity   Alcohol  use: Yes    Alcohol /week: 5.0 standard drinks of alcohol     Types: 5 Cans of beer per week   Drug use: Not Currently    Types: Cocaine   Sexual activity: Yes    Partners: Female  Other Topics Concern   Not on file  Social History Narrative   Lives with brother and his sister in social worker.     Pt doesn't work    Social Drivers of Health   Tobacco Use: High Risk (03/10/2024)   Patient History    Smoking Tobacco Use: Every Day    Smokeless Tobacco Use: Never    Passive Exposure: Current  Financial Resource Strain: Low Risk (04/23/2023)   Overall Financial Resource Strain (CARDIA)    Difficulty of Paying Living Expenses: Not hard at all  Food Insecurity: No Food Insecurity (04/23/2023)   Hunger Vital Sign    Worried About Running Out of Food in the Last Year: Never true    Ran Out of Food in the Last Year: Never true  Transportation Needs: No Transportation Needs (04/23/2023)   PRAPARE - Administrator, Civil Service (Medical): No    Lack of Transportation (Non-Medical): No  Physical Activity: Inactive (04/23/2023)   Exercise Vital Sign    Days of Exercise per Week: 0 days    Minutes of Exercise per Session: 30 min  Stress: No Stress Concern Present (04/23/2023)   [employer] of Occupational Health - Occupational Stress Questionnaire    Feeling of Stress : Not at all  Social Connections: Moderately Isolated  (04/23/2023)   Social Connection and Isolation Panel    Frequency of Communication with Friends and Family: More than three times a week    Frequency of Social Gatherings with Friends and Family: More than three times a week    Attends Religious Services: 1 to 4 times per year    Active Member of [employer] or Organizations: No    Attends Banker Meetings: Never    Marital Status: Divorced  Catering Manager Violence: Not At Risk (04/05/2023)   Humiliation, Afraid, Rape, and Kick questionnaire    Fear of Current or Ex-Partner: No    Emotionally Abused: No    Physically Abused: No    Sexually Abused: No  Depression (PHQ2-9): Low Risk (11/06/2023)   Depression (PHQ2-9)    PHQ-2 Score: 0  Alcohol  Screen: Low Risk (04/05/2023)   Alcohol  Screen    Last Alcohol  Screening Score (AUDIT): 4  Housing: Low Risk (04/23/2023)   Housing Stability Vital Sign    Unable to Pay for Housing in the Last Year: No    Number of Times Moved in the Last Year: 0    Homeless in the Last Year: No  Utilities: Not At Risk (04/05/2023)   AHC Utilities    Threatened with loss of utilities: No  Health Literacy: Adequate Health Literacy (04/05/2023)   B1300 Health Literacy    Frequency of need for help with medical instructions: Never      PHYSICAL EXAM  Vitals:   03/10/24 0959  BP: 114/65  Pulse: 91  Weight: 279 lb (126.6 kg)  Height: 5' 6 (1.676 m)   Body mass index is 45.03 kg/m.  Generalized: Well developed, in no acute distress  Chest: Lungs clear to auscultation bilaterally  Neurological examination  Mentation: Alert oriented to time, place, history taking. Follows all commands speech and language fluent Cranial nerve II-XII: Extraocular movements were full, visual field were full on confrontational test Head turning and shoulder shrug  were normal and symmetric. Motor: The motor testing reveals 5 over 5 strength of all 4 extremities. Good symmetric motor tone is noted throughout.  Sensory:  Sensory testing is intact to soft touch on all 4 extremities. No evidence of extinction is noted.  Gait and station: Gait is normal.    DIAGNOSTIC DATA (LABS, IMAGING, TESTING) - I reviewed patient records, labs, notes, testing and imaging myself where available.  Lab Results  Component Value Date   WBC 6.1 11/06/2023   HGB 15.1 11/06/2023   HCT 47.9 11/06/2023   MCV 85.1 11/06/2023   PLT 261 11/06/2023      Component Value Date/Time   NA 142 11/06/2023 1232   K 4.3 11/06/2023 1232   CL 106 11/06/2023 1232   CO2 24 11/06/2023 1232   GLUCOSE 83 11/06/2023 1232   GLUCOSE 321 (H) 01/28/2016 1144   BUN 12 11/06/2023 1232   CREATININE 0.73 11/06/2023 1232   CALCIUM  9.5 11/06/2023 1232   PROT 8.0 11/06/2023 1232   ALBUMIN 3.8 02/17/2020 1418   AST 19 11/06/2023 1232   ALT 19 11/06/2023 1232   ALKPHOS 65 02/17/2020 1418   BILITOT 0.2 11/06/2023 1232   GFRNONAA >60 06/15/2021 1700   GFRNONAA 95 12/18/2019 0815   GFRAA 110 12/18/2019 0815   Lab Results  Component Value Date   CHOL 165 11/06/2023   HDL 57 11/06/2023   LDLCALC 91 11/06/2023   TRIG 81 11/06/2023   CHOLHDL 2.9 11/06/2023   Lab Results  Component Value Date   HGBA1C 6.3 (H) 11/06/2023   No results found for: VITAMINB12 No results found for: TSH    ASSESSMENT AND PLAN 63 y.o. year old male  has a past medical history of Arthritis, Asthma, Chronic bronchitis (HCC), Cocaine abuse (HCC), Diabetes mellitus without complication (HCC), GERD (gastroesophageal reflux disease), Headache, Hyperlipidemia, Hypertension, OSA treated with BiPAP, Syncope, and Tobacco abuse. here with:  OSA on BiPAP  - CPAP compliance good - Good treatment of AHI  - Encourage patient to use CPAP nightly and > 4 hours each night - F/U in 1 year or sooner if needed    Duwaine Russell, MSN, NP-C 03/10/2024, 10:24 AM [facility] [address], [address] [place], [place] [address] [phone]  The patient's  condition requires frequent monitoring and adjustments in the treatment plan, reflecting the ongoing complexity of care.  This provider is the continuing focal point for all needed services for this condition.      [1] No Known Allergies

## 2024-04-01 ENCOUNTER — Encounter: Payer: Self-pay | Admitting: Adult Health

## 2024-04-08 ENCOUNTER — Other Ambulatory Visit: Payer: Self-pay | Admitting: Family Medicine

## 2024-04-08 DIAGNOSIS — J45909 Unspecified asthma, uncomplicated: Secondary | ICD-10-CM

## 2024-04-08 NOTE — Telephone Encounter (Signed)
 Copied from CRM [id number]. Topic: Clinical - Medication Refill >> Apr 08, 2024 12:00 PM Victoria B wrote: Medication: albuterol  (VENTOLIN  HFA) 108 (90 Base) MCG/ACT inhale/fluticasone -salmeterol (ADVAIR  DISKUS) 250-50 MCG/ACT AEPB   Has the patient contacted their pharmacy? no  This is the patient's preferred pharmacy:  [facility] - [place], [place] - [address] [place] [address] Phone: [phone] Fax: [phone]  Is this the correct pharmacy for this prescription? yes .   Has the prescription been filled recently? no  Is the patient out of the medication? yes  Has the patient been seen for an appointment in the last year OR does the patient have an upcoming appointment? yes  Can we respond through MyChart? yes  Agent: Please be advised that Rx refills may take up to 3 business days. We ask that you follow-up with your pharmacy.

## 2024-04-09 ENCOUNTER — Ambulatory Visit

## 2024-04-09 VITALS — Ht 66.0 in | Wt 279.0 lb

## 2024-04-09 DIAGNOSIS — Z0000 Encounter for general adult medical examination without abnormal findings: Secondary | ICD-10-CM | POA: Diagnosis not present

## 2024-04-09 MED ORDER — FLUTICASONE-SALMETEROL 250-50 MCG/ACT IN AEPB: 1.0000 | INHALATION_SPRAY | Freq: Two times a day (BID) | RESPIRATORY_TRACT | 4 refills | Status: AC

## 2024-04-09 MED ORDER — ALBUTEROL SULFATE HFA 108 (90 BASE) MCG/ACT IN AERS: INHALATION_SPRAY | RESPIRATORY_TRACT | 2 refills | Status: AC

## 2024-04-09 NOTE — Telephone Encounter (Signed)
 Requested by interface surescripts. No future visit .  Requested Prescriptions  Pending Prescriptions Disp Refills   albuterol  (VENTOLIN  HFA) 108 (90 Base) MCG/ACT inhaler 8.5 g 2    Sig: INHALE 2 PUFFS INTO THE LUNGS EVERY 4 HOURS AS NEEDED FOR SHORTNESS OF BREATH     Pulmonology:  Beta Agonists 2 Passed - 04/09/2024  1:24 PM      Passed - Last BP in normal range    BP Readings from Last 1 Encounters:  03/10/24 114/65         Passed - Last Heart Rate in normal range    Pulse Readings from Last 1 Encounters:  03/10/24 91         Passed - Valid encounter within last 12 months    Recent Outpatient Visits           5 months ago Type 2 diabetes mellitus without complication, without long-term current use of insulin  [facility])   [place] [facility] Pickard, Butler DASEN, MD   11 months ago Right sided sciatica   [place] [facility] Duanne, Butler DASEN, MD   1 year ago Right foot pain   [place] [facility] Duanne Butler DASEN, MD   1 year ago Possible exposure to STD   [place] [facility] Duanne Butler DASEN, MD   2 years ago Acute pain of left knee   [place] [facility] Duanne Butler DASEN, MD               fluticasone -salmeterol (ADVAIR  DISKUS) 250-50 MCG/ACT AEPB 1 each 4    Sig: Inhale 1 puff into the lungs in the morning and at bedtime.     Pulmonology:  [employer] Passed - 04/09/2024  1:24 PM      Passed - Valid encounter within last 12 months    Recent Outpatient Visits           5 months ago Type 2 diabetes mellitus without complication, without long-term current use of insulin  [facility])   [place] [facility] Pickard, Butler DASEN, MD   11 months ago Right sided sciatica   [place] [facility] Duanne Butler DASEN, MD   1 year ago Right foot pain   [place] [facility] Duanne Butler DASEN, MD   1 year ago Possible  exposure to STD   [place] [facility] Duanne Butler DASEN, MD   2 years ago Acute pain of left knee   [place] [facility] Pickard, Butler DASEN, MD

## 2024-04-09 NOTE — Progress Notes (Signed)
 "  Chief Complaint  Patient presents with   Medicare Wellness     Subjective:   Kenneth [name] is a 64 y.o. male who presents for a Medicare Annual Wellness Visit.  Visit info / Clinical Intake: Medicare Wellness Visit Type:: Subsequent Annual Wellness Visit Persons participating in visit and providing information:: patient Medicare Wellness Visit Mode:: Telephone If telephone:: video declined Since this visit was completed virtually, some vitals may be partially provided or unavailable. Missing vitals are due to the limitations of the virtual format.: Documented vitals are patient reported If Telephone or Video please confirm:: I connected with patient using audio/video enable telemedicine. I verified patient identity with two identifiers, discussed telehealth limitations, and patient agreed to proceed. Patient Location:: home Provider Location:: office Interpreter Needed?: No Pre-visit prep was completed: yes AWV questionnaire completed by patient prior to visit?: no Living arrangements:: with family/others Patient's Overall Health Status Rating: good Typical amount of pain: some Does pain affect daily life?: no Are you currently prescribed opioids?: no  Dietary Habits and Nutritional Risks How many meals a day?: 3 Eats fruit and vegetables daily?: yes Most meals are obtained by: having others provide food; eating out In the last 2 weeks, have you had any of the following?: none Diabetic:: (!) yes Any non-healing wounds?: no How often do you check your BS?: 1 Would you like to be referred to a Nutritionist or for Diabetic Management? : no  Functional Status Activities of Daily Living (to include ambulation/medication): Independent Ambulation: Independent Medication Administration: Independent Home Management (perform basic housework or laundry): Independent Manage your own finances?: yes Primary transportation is: family / friends Concerns about vision?: no *vision  screening is required for WTM* Concerns about hearing?: no  Fall Screening Falls in the past year?: 0 Number of falls in past year: 0 Was there an injury with Fall?: 0 Fall Risk Category Calculator: 0 Patient Fall Risk Level: Low Fall Risk  Fall Risk Patient at Risk for Falls Due to: No Fall Risks Fall risk Follow up: Falls evaluation completed; Education provided; Falls prevention discussed  Home and Transportation Safety: All rugs have non-skid backing?: yes All stairs or steps have railings?: yes Grab bars in the bathtub or shower?: yes Have non-skid surface in bathtub or shower?: yes Good home lighting?: yes Regular seat belt use?: yes Hospital stays in the last year:: no  Cognitive Assessment Difficulty concentrating, remembering, or making decisions? : no Will 6CIT or Mini Cog be Completed: no 6CIT or Mini Cog Declined: patient declined  Advance Directives (For Healthcare) Does Patient Have a Medical Advance Directive?: No Would patient like information on creating a medical advance directive?: Yes (MAU/Ambulatory/Procedural Areas - Information given)  Reviewed/Updated  Reviewed/Updated: Reviewed All (Medical, Surgical, Family, Medications, Allergies, Care Teams, Patient Goals)    Allergies (verified) Patient has no known allergies.   Current Medications (verified) Outpatient Encounter Medications as of 04/09/2024  Medication Sig   ACCU-CHEK GUIDE test strip USE AS DIRECTED TO MONITOR FSBS 2 TIMES A DAY   Accu-Chek Softclix Lancets lancets Use as instructed   albuterol  (VENTOLIN  HFA) 108 (90 Base) MCG/ACT inhaler INHALE 2 PUFFS INTO THE LUNGS EVERY 4 HOURS AS NEEDED FOR SHORTNESS OF BREATH   aspirin  81 MG chewable tablet Chew 81 mg by mouth in the morning.   atorvastatin  (LIPITOR) 40 MG tablet TAKE 1 TABLET (40 MG TOTAL) BY MOUTH DAILY.   blood glucose meter kit and supplies KIT Dispense based on patient and insurance preference. Use up  to four times daily as  directed.   Blood Glucose Monitoring Suppl (ACCU-CHEK AVIVA PLUS) w/Device KIT Use as directed to monitor FSBS 2x daily. Dx: E11.9   cyclobenzaprine  (FLEXERIL ) 10 MG tablet Take 0.5-1 tablets (5-10 mg total) by mouth 2 (two) times daily as needed for muscle spasms.   Erenumab -aooe (AIMOVIG ) 140 MG/ML SOAJ Inject 140 mg into the skin every 30 (thirty) days.   fluticasone -salmeterol (ADVAIR  DISKUS) 250-50 MCG/ACT AEPB Inhale 1 puff into the lungs in the morning and at bedtime.   gabapentin  (NEURONTIN ) 300 MG capsule Take 2 capsules (600 mg total) by mouth 3 (three) times daily. TAKE 1 CAPSULE BY MOUTH 3 TIMES DAILY AS NEEDED (NERVE PAIN).   glucose blood test strip 1 each by Other route 2 (two) times daily. Use as instructed   JARDIANCE  25 MG TABS tablet TAKE 1 TABLET (25 MG TOTAL) BY MOUTH DAILY BEFORE BREAKFAST.   metFORMIN  (GLUCOPHAGE ) 1000 MG tablet TAKE 1 TABLET BY MOUTH 2 TIMES DAILY WITH A MEAL.   MOUNJARO  10 MG/0.5ML Pen INJECT 10 MG INTO THE SKIN ONCE A WEEK.   nitroGLYCERIN  (NITROSTAT ) 0.4 MG SL tablet Place 1 tablet (0.4 mg total) under the tongue every 5 (five) minutes as needed. For chest pain (Patient taking differently: Place 0.4 mg under the tongue every 5 (five) minutes x 3 doses as needed for chest pain.)   nystatin  cream (MYCOSTATIN ) Apply 1 Application topically 3 (three) times daily.   omeprazole  (PRILOSEC) 40 MG capsule TAKE 1 CAPSULE BY MOUTH DAILY.   pioglitazone  (ACTOS ) 30 MG tablet TAKE 1 TABLET (30 MG TOTAL) BY MOUTH DAILY.   senna-docusate (SENOKOT-S) 8.6-50 MG tablet Take 1 tablet by mouth daily as needed (constipation).   topiramate  (TOPAMAX ) 25 MG tablet TAKE 2 TABLETS BY MOUTH 2 TIMES DAILY.   Allopurinol  200 MG TABS Take 200 mg by mouth daily. TAKE 1 TAB (200MG )  PO DAILY (Patient not taking: Reported on 04/09/2024)   amLODipine  (NORVASC ) 5 MG tablet TAKE 1 TABLET (5 MG TOTAL) BY MOUTH DAILY. (Patient not taking: Reported on 04/09/2024)   celecoxib  (CELEBREX ) 200 MG  capsule Take 1 capsule (200 mg total) by mouth 2 (two) times daily. (Patient not taking: Reported on 04/09/2024)   HYDROcodone -acetaminophen  (NORCO) 5-325 MG tablet Take 1-2 tablets by mouth 2 (two) times daily as needed. (Patient not taking: Reported on 04/09/2024)   linaclotide  (LINZESS ) 145 MCG CAPS capsule Take 1 capsule (145 mcg total) by mouth daily before breakfast. (Patient not taking: Reported on 04/09/2024)   meloxicam  (MOBIC ) 15 MG tablet Take 1 tablet (15 mg total) by mouth daily. (Patient not taking: Reported on 04/09/2024)   Varenicline  Tartrate, Starter, (CHANTIX  STARTING MONTH PAK) 0.5 MG X 11 & 1 MG X 42 TBPK Take 0.5 mg tablet by mouth 1x daily for 3 days, then increase to 0.5 mg tablet 2x daily for 4 days, then increase to 1 mg tablet 2x daily. (Patient not taking: Reported on 04/09/2024)   Facility-Administered Encounter Medications as of 04/09/2024  Medication   lactated ringers  infusion    History: Past Medical History:  Diagnosis Date   Arthritis    Asthma    Chronic bronchitis (HCC)    Cocaine abuse (HCC)    Diabetes mellitus without complication (HCC)    GERD (gastroesophageal reflux disease)    Headache    Hyperlipidemia    Hypertension    OSA treated with BiPAP    ahi-132, bipap 23/19   Syncope    Tobacco abuse  Past Surgical History:  Procedure Laterality Date   ESOPHAGOGASTRODUODENOSCOPY (EGD) WITH PROPOFOL  N/A 12/11/2012   Procedure: ESOPHAGOGASTRODUODENOSCOPY (EGD) WITH PROPOFOL ;  Surgeon: Elsie Cree, MD;  Location: [facility];  Service: Endoscopy;  Laterality: N/A;   FINGER AMPUTATION     KNEE ARTHROSCOPY Left 11/15/2015   Procedure: ARTHROSCOPY KNEE AND MEDIAL MENISECTOMY;  Surgeon: Donnice Car, MD;  Location: [facility];  Service: Orthopedics;  Laterality: Left;   KNEE ARTHROSCOPY WITH LATERAL MENISECTOMY Right 07/14/2020   Procedure: RIGHT KNEE PARTIAL LATERAL MENISCECTOMY;  Surgeon: Jerri Kay HERO, MD;  Location: [facility];  Service: Orthopedics;   Laterality: Right;   Family History  Problem Relation Age of Onset   Hypertension Father    Diabetes Father    Diabetes Sister    Stroke Sister    Diabetes Brother    Diabetes Other        Multiple family members   Sleep apnea Neg Hx    Social History   Occupational History   Not on file  Tobacco Use   Smoking status: Every Day    Current packs/day: 0.50    Average packs/day: 0.5 packs/day for 40.0 years (20.0 ttl pk-yrs)    Types: Cigarettes    Passive exposure: Current   Smokeless tobacco: Never   Tobacco comments:    06/05/17 1 pack per 1 1/2 days    Smoking Cessation Classes, Agencies, Public Librarian.  Vaping Use   Vaping status: Not on file  Substance and Sexual Activity   Alcohol  use: Yes    Alcohol /week: 5.0 standard drinks of alcohol     Types: 5 Cans of beer per week   Drug use: Not Currently    Types: Cocaine   Sexual activity: Yes    Partners: Female   Tobacco Counseling Ready to quit: Not Answered Counseling given: Not Answered Tobacco comments: 06/05/17 1 pack per 1 1/2 days Smoking Cessation Classes, Agencies, Public Librarian.  SDOH Screenings   Food Insecurity: No Food Insecurity (04/09/2024)  Housing: Low Risk (04/09/2024)  Transportation Needs: No Transportation Needs (04/09/2024)  Utilities: Not At Risk (04/09/2024)  Alcohol  Screen: Low Risk (04/05/2023)  Depression (PHQ2-9): Low Risk (04/09/2024)  Financial Resource Strain: Low Risk (04/23/2023)  Physical Activity: Inactive (04/09/2024)  Social Connections: Moderately Isolated (04/09/2024)  Stress: No Stress Concern Present (04/09/2024)  Tobacco Use: High Risk (04/09/2024)  Health Literacy: Adequate Health Literacy (04/09/2024)   See flowsheets for full screening details  Depression Screen PHQ 2 & 9 Depression Scale- Over the past 2 weeks, how often have you been bothered by any of the following problems? Little interest or pleasure in doing things: 0 Feeling down,  depressed, or hopeless (PHQ Adolescent also includes...irritable): 0 PHQ-2 Total Score: 0     Goals Addressed             This Visit's Progress    Quit Smoking   On track            Objective:    Today's Vitals   04/09/24 1032  Weight: 279 lb (126.6 kg)  Height: 5' 6 (1.676 m)   Body mass index is 45.03 kg/m.  Hearing/Vision screen No results found. Immunizations and Health Maintenance Health Maintenance  Topic Date Due   Zoster Vaccines- Shingrix (1 of 2) Never done   Lung Cancer Screening  06/16/2022   FOOT EXAM  10/05/2023   COVID-19 Vaccine (3 - 2025-26 season) 11/26/2023   Diabetic kidney evaluation - Urine ACR  04/26/2024  Influenza Vaccine  06/24/2024 (Originally 10/26/2023)   Pneumococcal Vaccine: 50+ Years (2 of 2 - PCV) 11/22/2024 (Originally 08/25/2017)   Hepatitis C Screening  11/22/2024 (Originally 04/30/1978)   HEMOGLOBIN A1C  05/08/2024   Diabetic kidney evaluation - eGFR measurement  11/05/2024   OPHTHALMOLOGY EXAM  11/05/2024   Medicare Annual Wellness (AWV)  04/09/2025   Colonoscopy  03/27/2026   DTaP/Tdap/Td (3 - Td or Tdap) 06/16/2031   HIV Screening  Completed   Hepatitis B Vaccines 19-59 Average Risk  Aged Out   HPV VACCINES  Aged Out   Meningococcal B Vaccine  Aged Out        Assessment/Plan:  This is a routine wellness examination for Kenneth [name].  Patient Care Team: Duanne Butler DASEN, MD as PCP - General (Family Medicine) Pietro Redell RAMAN, MD as PCP - Cardiology (Cardiology) Fleeta Zerita DASEN, MD as Consulting Physician (Ophthalmology) Buck Saucer, MD as Consulting Physician (Neurology) Zan Factor, DPM as Consulting Physician (Podiatry) Jonathon, Rockey Hamilton, PA-C as Physician Assistant (Orthopedic Surgery)  I have personally reviewed and noted the following in the patients chart:   Medical and social history Use of alcohol , tobacco or illicit drugs  Current medications and supplements including opioid prescriptions. Functional  ability and status Nutritional status Physical activity Advanced directives List of other physicians Hospitalizations, surgeries, and ER visits in previous 12 months Vitals Screenings to include cognitive, depression, and falls Referrals and appointments  No orders of the defined types were placed in this encounter.  In addition, I have reviewed and discussed with patient certain preventive protocols, quality metrics, and best practice recommendations. A written personalized care plan for preventive services as well as general preventive health recommendations were provided to patient.   Lavelle Charmaine Browner, LPN   [id number]   Return in 1 year (on 04/09/2025).  After Visit Summary: (Mail) Due to this being a telephonic visit, the after visit summary with patients personalized plan was offered to patient via mail   Nurse Notes: No voiced or noted concerns at this time HM Addressed: patient declines flu and shingrix (information provided) declines lung screening; declines to schedule routine office visit at this time    "

## 2024-04-09 NOTE — Patient Instructions (Signed)
 Kenneth [name],  Thank you for taking the time for your Medicare Wellness Visit. I appreciate your continued commitment to your health goals. Please review the care plan we discussed, and feel free to reach out if I can assist you further.  Please note that Annual Wellness Visits do not include a physical exam. Some assessments may be limited, especially if the visit was conducted virtually. If needed, we may recommend an in-person follow-up with your provider.  Ongoing Care Seeing your primary care provider every 3 to 6 months helps us  monitor your health and provide consistent, personalized care.   Referrals If a referral was made during today's visit and you haven't received any updates within two weeks, please contact the referred provider directly to check on the status.  Recommended Screenings:  Health Maintenance  Topic Date Due   Zoster (Shingles) Vaccine (1 of 2) Never done   Screening for Lung Cancer  06/16/2022   Complete foot exam   10/05/2023   COVID-19 Vaccine (3 - 2025-26 season) 11/26/2023   Yearly kidney health urinalysis for diabetes  04/26/2024   Flu Shot  06/24/2024*   Pneumococcal Vaccine for age over 23 (2 of 2 - PCV) 11/22/2024*   Hepatitis C Screening  11/22/2024*   Hemoglobin A1C  05/08/2024   Yearly kidney function blood test for diabetes  11/05/2024   Eye exam for diabetics  11/05/2024   Medicare Annual Wellness Visit  04/09/2025   Colon Cancer Screening  03/27/2026   DTaP/Tdap/Td vaccine (3 - Td or Tdap) 06/16/2031   HIV Screening  Completed   Hepatitis B Vaccine  Aged Out   HPV Vaccine  Aged Out   Meningitis B Vaccine  Aged Out  *Topic was postponed. The date shown is not the original due date.       04/09/2024   10:35 AM  Advanced Directives  Does Patient Have a Medical Advance Directive? No  Would patient like information on creating a medical advance directive? Yes (MAU/Ambulatory/Procedural Areas - Information given)   Information on Advanced  Care Planning can be found at [place]  Secretary of [facility] Advance Health Care Directives Advance Health Care Directives ([email or web address]   Vision: Annual vision screenings are recommended for early detection of glaucoma, cataracts, and diabetic retinopathy. These exams can also reveal signs of chronic conditions such as diabetes and high blood pressure.  Dental: Annual dental screenings help detect early signs of oral cancer, gum disease, and other conditions linked to overall health, including heart disease and diabetes.  Please see the attached documents for additional preventive care recommendations.

## 2024-05-01 ENCOUNTER — Other Ambulatory Visit: Payer: Self-pay | Admitting: Family Medicine

## 2024-05-01 DIAGNOSIS — G4452 New daily persistent headache (NDPH): Secondary | ICD-10-CM

## 2025-03-16 ENCOUNTER — Ambulatory Visit: Admitting: Adult Health
